# Patient Record
Sex: Male | Born: 1940
Health system: Southern US, Community
[De-identification: ages and names within clinical notes are randomized; demographics above are authoritative.]

## PROBLEM LIST (undated history)

## (undated) DIAGNOSIS — C801 Malignant (primary) neoplasm, unspecified: Secondary | ICD-10-CM

## (undated) DIAGNOSIS — M1990 Unspecified osteoarthritis, unspecified site: Secondary | ICD-10-CM

## (undated) DIAGNOSIS — S0990XA Unspecified injury of head, initial encounter: Secondary | ICD-10-CM

## (undated) DIAGNOSIS — Z9889 Other specified postprocedural states: Secondary | ICD-10-CM

## (undated) DIAGNOSIS — C9110 Chronic lymphocytic leukemia of B-cell type not having achieved remission: Secondary | ICD-10-CM

## (undated) DIAGNOSIS — F419 Anxiety disorder, unspecified: Secondary | ICD-10-CM

## (undated) DIAGNOSIS — C61 Malignant neoplasm of prostate: Secondary | ICD-10-CM

## (undated) DIAGNOSIS — I10 Essential (primary) hypertension: Secondary | ICD-10-CM

## (undated) DIAGNOSIS — F32A Depression, unspecified: Secondary | ICD-10-CM

## (undated) DIAGNOSIS — D72820 Lymphocytosis (symptomatic): Secondary | ICD-10-CM

## (undated) DIAGNOSIS — R112 Nausea with vomiting, unspecified: Secondary | ICD-10-CM

## (undated) HISTORY — DX: Lymphocytosis (symptomatic): D72.820

## (undated) HISTORY — PX: HERNIA REPAIR: SHX51

## (undated) HISTORY — PX: FRACTURE SURGERY: SHX138

## (undated) HISTORY — DX: Chronic lymphocytic leukemia of B-cell type not having achieved remission: C91.10

## (undated) HISTORY — PX: APPENDECTOMY: SHX54

## (undated) HISTORY — PX: CATARACT EXTRACTION, BILATERAL: SHX1313

## (undated) HISTORY — PX: PROSTATE BIOPSY: SHX241

---

## 1998-06-09 HISTORY — PX: CERVICAL DISC SURGERY: SHX588

## 2009-06-09 HISTORY — PX: COLONOSCOPY: SHX174

## 2010-06-09 HISTORY — PX: OTHER SURGICAL HISTORY: SHX169

## 2011-10-22 ENCOUNTER — Ambulatory Visit: Payer: Self-pay | Admitting: Orthopedic Surgery

## 2012-04-21 ENCOUNTER — Encounter (HOSPITAL_COMMUNITY): Payer: Self-pay | Admitting: Pharmacy Technician

## 2012-04-22 ENCOUNTER — Other Ambulatory Visit: Payer: Self-pay | Admitting: Orthopedic Surgery

## 2012-04-27 NOTE — H&P (Signed)
TOTAL HIP ADMISSION H&P  Patient is admitted for right total hip arthroplasty.  Subjective:  Chief Complaint: right hip pain  HPI: Cord Wilczynski, 71 y.o. male, has a history of pain and functional disability in the right hip(s) due to trauma and patient has failed non-surgical conservative treatments for greater than 12 weeks to include NSAID's and/or analgesics, corticosteriod injections and activity modification.  Onset of symptoms was gradual starting 2 years ago with gradually worsening course since that time.The patient noted prior procedures of the hip to include pinning of right hip after a fall from a horse in February 2012. on the right hip(s).  Patient currently rates pain in the right hip at 7 out of 10 with activity. Patient has worsening of pain with activity and weight bearing and pain that interfers with activities of daily living. Patient has evidence of subchondral cysts and joint space narrowing by imaging studies. This condition presents safety issues increasing the risk of falls. This patient has had proximal femur fracture.  There is no current active infection.  There are no active problems to display for this patient.  No past medical history on file.  No past surgical history on file.  No prescriptions prior to admission   No Known Allergies  History  Substance Use Topics  . Smoking status: Not on file  . Smokeless tobacco: Not on file  . Alcohol Use: Not on file    No family history on file.   Review of Systems  Constitutional: Negative.   HENT: Negative.   Eyes: Negative.   Respiratory: Negative.   Cardiovascular: Negative.   Gastrointestinal: Negative.   Genitourinary: Negative.   Musculoskeletal: Positive for joint pain.  Skin: Negative.   Neurological: Negative.   Endo/Heme/Allergies: Negative.   Psychiatric/Behavioral: Negative.     Objective:  Physical Exam  Constitutional: He is oriented to person, place, and time. He appears well-developed  and well-nourished.  HENT:  Head: Normocephalic and atraumatic.  Eyes: Pupils are equal, round, and reactive to light.  Cardiovascular: Normal heart sounds.   Respiratory: Breath sounds normal.  GI: Bowel sounds are normal.  Musculoskeletal:       Right hip: He exhibits decreased range of motion and decreased strength.  Neurological: He is alert and oriented to person, place, and time.  Psychiatric: He has a normal mood and affect.    Vital signs in last 24 hours:    Labs:   There is no height or weight on file to calculate BMI.   Imaging Review Plain radiographs demonstrate severe degenerative joint disease of the right hip(s). The bone quality appears to be adequate for age and reported activity level.  Assessment/Plan:  End stage arthritis, right hip(s)  The patient history, physical examination, clinical judgement of the provider and imaging studies are consistent with end stage degenerative joint disease of the right hip(s) and total hip arthroplasty is deemed medically necessary. The treatment options including medical management, injection therapy, arthroscopy and arthroplasty were discussed at length. The risks and benefits of total hip arthroplasty were presented and reviewed. The risks due to aseptic loosening, infection, stiffness, dislocation/subluxation,  thromboembolic complications and other imponderables were discussed.  The patient acknowledged the explanation, agreed to proceed with the plan and consent was signed. Patient is being admitted for inpatient treatment for surgery, pain control, PT, OT, prophylactic antibiotics, VTE prophylaxis, progressive ambulation and ADL's and discharge planning.The patient is planning to be discharged home with home health services

## 2012-04-28 ENCOUNTER — Encounter (HOSPITAL_COMMUNITY)
Admission: RE | Admit: 2012-04-28 | Discharge: 2012-04-28 | Disposition: A | Discharge status: Home / Self Care | Payer: Medicare Other | Source: Ambulatory Visit | Attending: Orthopedic Surgery | Admitting: Orthopedic Surgery

## 2012-04-28 ENCOUNTER — Encounter (HOSPITAL_COMMUNITY): Payer: Self-pay

## 2012-04-28 HISTORY — DX: Essential (primary) hypertension: I10

## 2012-04-28 HISTORY — DX: Malignant (primary) neoplasm, unspecified: C80.1

## 2012-04-28 HISTORY — DX: Anxiety disorder, unspecified: F41.9

## 2012-04-28 HISTORY — DX: Unspecified osteoarthritis, unspecified site: M19.90

## 2012-04-28 HISTORY — DX: Nausea with vomiting, unspecified: R11.2

## 2012-04-28 HISTORY — DX: Other specified postprocedural states: Z98.890

## 2012-04-28 LAB — URINALYSIS, ROUTINE W REFLEX MICROSCOPIC
Bilirubin Urine: NEGATIVE
Glucose, UA: NEGATIVE mg/dL
Hgb urine dipstick: NEGATIVE
Ketones, ur: NEGATIVE mg/dL
Leukocytes, UA: NEGATIVE
Nitrite: NEGATIVE
Protein, ur: NEGATIVE mg/dL
Specific Gravity, Urine: 1.027 (ref 1.005–1.030)
Urobilinogen, UA: 1 mg/dL (ref 0.0–1.0)
pH: 7 (ref 5.0–8.0)

## 2012-04-28 LAB — CBC WITH DIFFERENTIAL/PLATELET
Basophils Absolute: 0 10*3/uL (ref 0.0–0.1)
Basophils Relative: 0 % (ref 0–1)
Eosinophils Absolute: 0.2 10*3/uL (ref 0.0–0.7)
Eosinophils Relative: 1 % (ref 0–5)
HCT: 43.2 % (ref 39.0–52.0)
Hemoglobin: 14.9 g/dL (ref 13.0–17.0)
Lymphocytes Relative: 66 % — ABNORMAL HIGH (ref 12–46)
Lymphs Abs: 10.2 10*3/uL — ABNORMAL HIGH (ref 0.7–4.0)
MCH: 30.7 pg (ref 26.0–34.0)
MCHC: 34.5 g/dL (ref 30.0–36.0)
MCV: 88.9 fL (ref 78.0–100.0)
Monocytes Absolute: 0.6 10*3/uL (ref 0.1–1.0)
Monocytes Relative: 4 % (ref 3–12)
Neutro Abs: 4.5 10*3/uL (ref 1.7–7.7)
Neutrophils Relative %: 29 % — ABNORMAL LOW (ref 43–77)
Platelets: 180 10*3/uL (ref 150–400)
RBC: 4.86 MIL/uL (ref 4.22–5.81)
RDW: 13.9 % (ref 11.5–15.5)
WBC: 15.5 10*3/uL — ABNORMAL HIGH (ref 4.0–10.5)

## 2012-04-28 LAB — BASIC METABOLIC PANEL
BUN: 18 mg/dL (ref 6–23)
CO2: 27 mEq/L (ref 19–32)
Calcium: 9.2 mg/dL (ref 8.4–10.5)
Chloride: 99 mEq/L (ref 96–112)
Creatinine, Ser: 0.77 mg/dL (ref 0.50–1.35)
GFR calc Af Amer: >90 mL/min (ref >90)
GFR calc non Af Amer: >90 mL/min (ref >90)
Glucose, Bld: 104 mg/dL — ABNORMAL HIGH (ref 70–99)
Potassium: 3.7 mEq/L (ref 3.5–5.1)
Sodium: 135 mEq/L (ref 135–145)

## 2012-04-28 LAB — PATHOLOGIST SMEAR REVIEW

## 2012-04-28 LAB — PROTIME-INR
INR: 0.99 (ref 0.00–1.49)
Prothrombin Time: 13 seconds (ref 11.6–15.2)

## 2012-04-28 LAB — APTT: aPTT: 25 seconds (ref 24–37)

## 2012-04-28 LAB — ABO/RH: ABO/RH(D): AB POS

## 2012-04-28 LAB — TYPE AND SCREEN
ABO/RH(D): AB POS
Antibody Screen: NEGATIVE

## 2012-04-28 LAB — SURGICAL PCR SCREEN
MRSA, PCR: NEGATIVE
Staphylococcus aureus: POSITIVE — AB

## 2012-04-28 MED ORDER — CHLORHEXIDINE GLUCONATE 4 % EX LIQD: 60.0000 mL | Freq: Once | CUTANEOUS | Status: DC

## 2012-04-28 NOTE — Progress Notes (Signed)
req'd notes, ekg from dr Cletis Athens at [place] [facility] [phone]. Susie to fax

## 2012-04-28 NOTE — Pre-Procedure Instructions (Addendum)
20 Brian Bean  04/28/2012   Your procedure is scheduled on:  05/03/12  Report to [facility] at 745 AM.  Call this number if you have problems the morning of surgery: [phone]   Remember:   Do not eat food: or drinkAfter Midnight.  .  Take these medicines the morning of surgery with A SIP OF WATER:prozac/ pain med,coreg. cardizem  STOP naproxen   Do not wear jewelry,   Do not wear lotions, powders, or perfumes  Do not shave 48 hours prior to surgery. Men may shave face and neck.  Do not bring valuables to the hospital.  Contacts, dentures or bridgework may not be worn into surgery.  Leave suitcase in the car. After surgery it may be brought to your room.  For patients admitted to the hospital, checkout time is 11:00 AM the day of discharge.   Patients discharged the day of surgery will not be allowed to drive home.  Name and phone number of your driver Laroy Apple [phone]  Special Instructions: Incentive Spirometry - Practice and bring it with you on the day of surgery. Shower using CHG 2 nights before surgery and the night before surgery.  If you shower the day of surgery use CHG.  Use special wash - you have one bottle of CHG for all showers.  You should use approximately 1/3 of the bottle for each shower.   Please read over the following fact sheets that you were given: Pain Booklet, Coughing and Deep Breathing, Blood Transfusion Information, MRSA Information and Surgical Site Infection Prevention

## 2012-04-29 NOTE — Consult Note (Signed)
Anesthesia chart review: Patient is a 70 year old male scheduled for right hip screw removal, right hip arthroplasty by Dr. Turner Daniels on 05/03/2012. History includes former smoker, hypertension, anxiety, arthritis, postoperative nausea and vomiting, cervical disc surgery, skin cancer. PCP is Dr. Carolin Coy.  EKG on 04/28/12 showed NSR, right BBB.  EKG is stable since 06/13/10.  No CV symptoms were documented at his PAT visit.  Chest x-ray on 04/28/2012 showed no acute abnormality. Old left rib fractures. Post surgical changes in the cervical spine.  Labs showed a WBC of 15.5, atypical lymphocytes.  BMET, UA, H/H, PT/PTT WNL.  I called these results to Agustin Cree.  She reviewed these with Dr. Turner Daniels.  His office is going to forward patient's labs to Dr. Gerri Spore for review (specifcally CBC with differential results).  If Dr. Gerri Spore can not see patient before surgery then it is likely that Dr. Turner Daniels will postpone the procedure.    Would anticipate that patient can proceed from an anesthesia standpoint if asymptomatic from a CV standpoint and no active s/s of infection.  Defer timing of further evaluation of leukocytosis to Dr. Turner Daniels and Dr. Gerri Spore.  Shonna Chock, PA-C  04/29/12 1702

## 2012-04-30 NOTE — Progress Notes (Signed)
Notified patient to arrive at 0700 on DOS due to surgery time change.

## 2012-05-02 MED ORDER — CEFAZOLIN SODIUM-DEXTROSE 2-3 GM-% IV SOLR
2.0000 g | INTRAVENOUS | Status: AC
  Administered 2012-05-03: 2 g via INTRAVENOUS
  Filled 2012-05-02: qty 50

## 2012-05-03 ENCOUNTER — Encounter (HOSPITAL_COMMUNITY)
Admission: RE | Disposition: A | Discharge status: Home Health | Payer: Self-pay | Source: Ambulatory Visit | Attending: Orthopedic Surgery

## 2012-05-03 ENCOUNTER — Inpatient Hospital Stay (HOSPITAL_COMMUNITY): Payer: Medicare Other

## 2012-05-03 ENCOUNTER — Encounter (HOSPITAL_COMMUNITY): Payer: Self-pay | Admitting: Vascular Surgery

## 2012-05-03 ENCOUNTER — Inpatient Hospital Stay (HOSPITAL_COMMUNITY): Payer: Medicare Other | Admitting: Vascular Surgery

## 2012-05-03 ENCOUNTER — Encounter (HOSPITAL_COMMUNITY): Payer: Self-pay | Admitting: Surgery

## 2012-05-03 ENCOUNTER — Inpatient Hospital Stay (HOSPITAL_COMMUNITY)
Admission: RE | Admit: 2012-05-03 | Discharge: 2012-05-05 | DRG: 470 | Disposition: A | Discharge status: Home Health | Payer: Medicare Other | Source: Ambulatory Visit | Attending: Orthopedic Surgery | Admitting: Orthopedic Surgery

## 2012-05-03 DIAGNOSIS — Z7982 Long term (current) use of aspirin: Secondary | ICD-10-CM

## 2012-05-03 DIAGNOSIS — Z85828 Personal history of other malignant neoplasm of skin: Secondary | ICD-10-CM

## 2012-05-03 DIAGNOSIS — Z472 Encounter for removal of internal fixation device: Secondary | ICD-10-CM

## 2012-05-03 DIAGNOSIS — I10 Essential (primary) hypertension: Secondary | ICD-10-CM | POA: Diagnosis present

## 2012-05-03 DIAGNOSIS — Z01812 Encounter for preprocedural laboratory examination: Secondary | ICD-10-CM

## 2012-05-03 DIAGNOSIS — F411 Generalized anxiety disorder: Secondary | ICD-10-CM | POA: Diagnosis present

## 2012-05-03 DIAGNOSIS — Z79899 Other long term (current) drug therapy: Secondary | ICD-10-CM

## 2012-05-03 DIAGNOSIS — M87059 Idiopathic aseptic necrosis of unspecified femur: Principal | ICD-10-CM | POA: Diagnosis present

## 2012-05-03 DIAGNOSIS — M167 Other unilateral secondary osteoarthritis of hip: Secondary | ICD-10-CM | POA: Diagnosis present

## 2012-05-03 HISTORY — PX: TOTAL HIP ARTHROPLASTY: SHX124

## 2012-05-03 SURGERY — ARTHROPLASTY, HIP, TOTAL,POSTERIOR APPROACH
Anesthesia: Spinal | Site: Hip | Laterality: Right | Wound class: Clean

## 2012-05-03 MED ORDER — FLEET ENEMA 7-19 GM/118ML RE ENEM: 1.0000 | ENEMA | Freq: Once | RECTAL | Status: AC | PRN

## 2012-05-03 MED ORDER — BUPIVACAINE-EPINEPHRINE PF 0.25-1:200000 % IJ SOLN
INTRAMUSCULAR | Status: AC
  Filled 2012-05-03: qty 30

## 2012-05-03 MED ORDER — BISACODYL 5 MG PO TBEC: 5.0000 mg | DELAYED_RELEASE_TABLET | Freq: Every day | ORAL | Status: DC | PRN

## 2012-05-03 MED ORDER — ONDANSETRON HCL 4 MG/2ML IJ SOLN
INTRAMUSCULAR | Status: AC
  Filled 2012-05-03: qty 2

## 2012-05-03 MED ORDER — PROPOFOL INFUSION 10 MG/ML OPTIME
INTRAVENOUS | Status: DC | PRN
  Administered 2012-05-03: 100 ug/kg/min via INTRAVENOUS
  Administered 2012-05-03: 11:00:00 via INTRAVENOUS

## 2012-05-03 MED ORDER — ZOLPIDEM TARTRATE 5 MG PO TABS: 5.0000 mg | ORAL_TABLET | Freq: Every evening | ORAL | Status: DC | PRN

## 2012-05-03 MED ORDER — OXYCODONE HCL 5 MG PO TABS
ORAL_TABLET | ORAL | Status: AC
  Filled 2012-05-03: qty 2

## 2012-05-03 MED ORDER — ONDANSETRON HCL 4 MG PO TABS: 4.0000 mg | ORAL_TABLET | Freq: Four times a day (QID) | ORAL | Status: DC | PRN

## 2012-05-03 MED ORDER — EPHEDRINE SULFATE 50 MG/ML IJ SOLN
INTRAMUSCULAR | Status: DC | PRN
  Administered 2012-05-03 (×2): 5 mg via INTRAVENOUS
  Administered 2012-05-03: 10 mg via INTRAVENOUS
  Administered 2012-05-03 (×3): 5 mg via INTRAVENOUS

## 2012-05-03 MED ORDER — METOCLOPRAMIDE HCL 10 MG PO TABS: 5.0000 mg | ORAL_TABLET | Freq: Three times a day (TID) | ORAL | Status: DC | PRN

## 2012-05-03 MED ORDER — PHENYLEPHRINE HCL 10 MG/ML IJ SOLN
INTRAMUSCULAR | Status: DC | PRN
  Administered 2012-05-03 (×2): 40 ug via INTRAVENOUS

## 2012-05-03 MED ORDER — LACTATED RINGERS IV SOLN
INTRAVENOUS | Status: DC
  Administered 2012-05-03: 09:00:00 via INTRAVENOUS

## 2012-05-03 MED ORDER — HYDROMORPHONE HCL PF 1 MG/ML IJ SOLN
0.2500 mg | INTRAMUSCULAR | Status: DC | PRN
  Administered 2012-05-03 (×2): 0.5 mg via INTRAVENOUS

## 2012-05-03 MED ORDER — PHENOL 1.4 % MT LIQD: 1.0000 | OROMUCOSAL | Status: DC | PRN

## 2012-05-03 MED ORDER — HYDROMORPHONE HCL PF 1 MG/ML IJ SOLN: 1.0000 mg | INTRAMUSCULAR | Status: DC | PRN

## 2012-05-03 MED ORDER — OXYCODONE HCL 5 MG PO TABS
5.0000 mg | ORAL_TABLET | ORAL | Status: DC | PRN
  Administered 2012-05-03 – 2012-05-05 (×4): 10 mg via ORAL
  Filled 2012-05-03 (×3): qty 2

## 2012-05-03 MED ORDER — ONDANSETRON HCL 4 MG/2ML IJ SOLN
INTRAMUSCULAR | Status: DC | PRN
  Administered 2012-05-03: 4 mg via INTRAVENOUS

## 2012-05-03 MED ORDER — GLYCOPYRROLATE 0.2 MG/ML IJ SOLN
INTRAMUSCULAR | Status: DC | PRN
  Administered 2012-05-03: 0.2 mg via INTRAVENOUS

## 2012-05-03 MED ORDER — SODIUM CHLORIDE 0.9 % IR SOLN
Status: DC | PRN
  Administered 2012-05-03: 1000 mL

## 2012-05-03 MED ORDER — HYDROMORPHONE HCL PF 1 MG/ML IJ SOLN
INTRAMUSCULAR | Status: AC
  Filled 2012-05-03: qty 1

## 2012-05-03 MED ORDER — LISINOPRIL 20 MG PO TABS
20.0000 mg | ORAL_TABLET | Freq: Two times a day (BID) | ORAL | Status: DC
  Administered 2012-05-03 – 2012-05-05 (×4): 20 mg via ORAL
  Filled 2012-05-03 (×5): qty 1

## 2012-05-03 MED ORDER — ALUM & MAG HYDROXIDE-SIMETH 200-200-20 MG/5ML PO SUSP: 30.0000 mL | ORAL | Status: DC | PRN

## 2012-05-03 MED ORDER — BUPIVACAINE-EPINEPHRINE PF 0.25-1:200000 % IJ SOLN
INTRAMUSCULAR | Status: DC | PRN
  Administered 2012-05-03: 20 mL

## 2012-05-03 MED ORDER — ACETAMINOPHEN 10 MG/ML IV SOLN: 1000.0000 mg | Freq: Once | INTRAVENOUS | Status: DC | PRN

## 2012-05-03 MED ORDER — METHOCARBAMOL 500 MG PO TABS
500.0000 mg | ORAL_TABLET | Freq: Four times a day (QID) | ORAL | Status: DC | PRN
  Administered 2012-05-03 – 2012-05-04 (×2): 500 mg via ORAL
  Filled 2012-05-03: qty 1

## 2012-05-03 MED ORDER — CARVEDILOL 25 MG PO TABS
25.0000 mg | ORAL_TABLET | Freq: Two times a day (BID) | ORAL | Status: DC
  Administered 2012-05-03 – 2012-05-05 (×4): 25 mg via ORAL
  Filled 2012-05-03 (×6): qty 1

## 2012-05-03 MED ORDER — MENTHOL 3 MG MT LOZG: 1.0000 | LOZENGE | OROMUCOSAL | Status: DC | PRN

## 2012-05-03 MED ORDER — ACETAMINOPHEN 325 MG PO TABS: 650.0000 mg | ORAL_TABLET | Freq: Four times a day (QID) | ORAL | Status: DC | PRN

## 2012-05-03 MED ORDER — LACTATED RINGERS IV SOLN
INTRAVENOUS | Status: DC | PRN
  Administered 2012-05-03 (×2): via INTRAVENOUS

## 2012-05-03 MED ORDER — CELECOXIB 200 MG PO CAPS
200.0000 mg | ORAL_CAPSULE | Freq: Two times a day (BID) | ORAL | Status: DC
  Administered 2012-05-03 – 2012-05-05 (×5): 200 mg via ORAL
  Filled 2012-05-03 (×6): qty 1

## 2012-05-03 MED ORDER — MIDAZOLAM HCL 5 MG/5ML IJ SOLN
INTRAMUSCULAR | Status: DC | PRN
  Administered 2012-05-03: 2 mg via INTRAVENOUS

## 2012-05-03 MED ORDER — DEXAMETHASONE SODIUM PHOSPHATE 10 MG/ML IJ SOLN
INTRAMUSCULAR | Status: DC | PRN
  Administered 2012-05-03: 8 mg via INTRAVENOUS

## 2012-05-03 MED ORDER — METHOCARBAMOL 500 MG PO TABS
ORAL_TABLET | ORAL | Status: AC
  Filled 2012-05-03: qty 1

## 2012-05-03 MED ORDER — FLUOXETINE HCL 20 MG PO CAPS
40.0000 mg | ORAL_CAPSULE | Freq: Every day | ORAL | Status: DC
  Administered 2012-05-04 – 2012-05-05 (×2): 40 mg via ORAL
  Filled 2012-05-03 (×2): qty 2

## 2012-05-03 MED ORDER — ONDANSETRON HCL 4 MG/2ML IJ SOLN
4.0000 mg | Freq: Once | INTRAMUSCULAR | Status: AC | PRN
  Administered 2012-05-03: 4 mg via INTRAVENOUS

## 2012-05-03 MED ORDER — FENTANYL CITRATE 0.05 MG/ML IJ SOLN
INTRAMUSCULAR | Status: DC | PRN
  Administered 2012-05-03 (×3): 50 ug via INTRAVENOUS

## 2012-05-03 MED ORDER — DEXTROSE-NACL 5-0.45 % IV SOLN: INTRAVENOUS | Status: DC

## 2012-05-03 MED ORDER — ACETAMINOPHEN 10 MG/ML IV SOLN
1000.0000 mg | Freq: Four times a day (QID) | INTRAVENOUS | Status: AC
  Administered 2012-05-03 – 2012-05-04 (×4): 1000 mg via INTRAVENOUS
  Filled 2012-05-03 (×4): qty 100

## 2012-05-03 MED ORDER — METHOCARBAMOL 100 MG/ML IJ SOLN
500.0000 mg | Freq: Four times a day (QID) | INTRAVENOUS | Status: DC | PRN
  Filled 2012-05-03: qty 5

## 2012-05-03 MED ORDER — MAGNESIUM HYDROXIDE 400 MG/5ML PO SUSP: 30.0000 mL | Freq: Every day | ORAL | Status: DC | PRN

## 2012-05-03 MED ORDER — HYDROCHLOROTHIAZIDE 25 MG PO TABS
25.0000 mg | ORAL_TABLET | Freq: Every day | ORAL | Status: DC
  Administered 2012-05-04: 25 mg via ORAL
  Filled 2012-05-03 (×2): qty 1

## 2012-05-03 MED ORDER — DIPHENHYDRAMINE HCL 12.5 MG/5ML PO ELIX: 12.5000 mg | ORAL_SOLUTION | ORAL | Status: DC | PRN

## 2012-05-03 MED ORDER — KCL IN DEXTROSE-NACL 20-5-0.45 MEQ/L-%-% IV SOLN
INTRAVENOUS | Status: DC
  Administered 2012-05-04: 125 mL/h via INTRAVENOUS
  Administered 2012-05-04: 13:00:00 via INTRAVENOUS
  Filled 2012-05-03 (×8): qty 1000

## 2012-05-03 MED ORDER — FLUOXETINE HCL 40 MG PO CAPS
40.0000 mg | ORAL_CAPSULE | Freq: Every day | ORAL | Status: DC
Start: 2012-05-03 — End: 2012-05-03

## 2012-05-03 MED ORDER — ACETAMINOPHEN 650 MG RE SUPP: 650.0000 mg | Freq: Four times a day (QID) | RECTAL | Status: DC | PRN

## 2012-05-03 MED ORDER — ASPIRIN EC 325 MG PO TBEC
325.0000 mg | DELAYED_RELEASE_TABLET | Freq: Two times a day (BID) | ORAL | Status: DC
  Administered 2012-05-03 – 2012-05-05 (×5): 325 mg via ORAL
  Filled 2012-05-03 (×6): qty 1

## 2012-05-03 MED ORDER — ONDANSETRON HCL 4 MG/2ML IJ SOLN: 4.0000 mg | Freq: Four times a day (QID) | INTRAMUSCULAR | Status: DC | PRN

## 2012-05-03 MED ORDER — METOCLOPRAMIDE HCL 5 MG/ML IJ SOLN: 5.0000 mg | Freq: Three times a day (TID) | INTRAMUSCULAR | Status: DC | PRN

## 2012-05-03 MED ORDER — DILTIAZEM HCL ER 60 MG PO CP12
120.0000 mg | ORAL_CAPSULE | Freq: Two times a day (BID) | ORAL | Status: DC
  Administered 2012-05-03 – 2012-05-05 (×4): 120 mg via ORAL
  Filled 2012-05-03 (×5): qty 2

## 2012-05-03 SURGICAL SUPPLY — 53 items
BLADE SAW SAG 73X25 THK (BLADE) ×1
BLADE SAW SGTL 18X1.27X75 (BLADE) IMPLANT
BLADE SAW SGTL 73X25 THK (BLADE) ×1 IMPLANT
BLADE SAW SGTL MED 73X18.5 STR (BLADE) IMPLANT
BRUSH FEMORAL CANAL (MISCELLANEOUS) IMPLANT
CLOTH BEACON ORANGE TIMEOUT ST (SAFETY) ×2 IMPLANT
COVER BACK TABLE 24X17X13 BIG (DRAPES) IMPLANT
COVER SURGICAL LIGHT HANDLE (MISCELLANEOUS) ×4 IMPLANT
DRAPE ORTHO SPLIT 77X108 STRL (DRAPES) ×1
DRAPE PROXIMA HALF (DRAPES) ×2 IMPLANT
DRAPE SURG ORHT 6 SPLT 77X108 (DRAPES) ×1 IMPLANT
DRAPE U-SHAPE 47X51 STRL (DRAPES) ×2 IMPLANT
DRILL BIT 7/64X5 (BIT) ×2 IMPLANT
DRSG MEPILEX BORDER 4X12 (GAUZE/BANDAGES/DRESSINGS) ×2 IMPLANT
DRSG MEPILEX BORDER 4X8 (GAUZE/BANDAGES/DRESSINGS) ×2 IMPLANT
DURAPREP 26ML APPLICATOR (WOUND CARE) ×2 IMPLANT
ELECT BLADE 4.0 EZ CLEAN MEGAD (MISCELLANEOUS)
ELECT REM PT RETURN 9FT ADLT (ELECTROSURGICAL) ×2
ELECTRODE BLDE 4.0 EZ CLN MEGD (MISCELLANEOUS) IMPLANT
ELECTRODE REM PT RTRN 9FT ADLT (ELECTROSURGICAL) ×1 IMPLANT
GAUZE XEROFORM 1X8 LF (GAUZE/BANDAGES/DRESSINGS) ×2 IMPLANT
GLOVE BIO SURGEON STRL SZ7 (GLOVE) ×2 IMPLANT
GLOVE BIO SURGEON STRL SZ7.5 (GLOVE) ×2 IMPLANT
GLOVE BIOGEL PI IND STRL 7.0 (GLOVE) ×1 IMPLANT
GLOVE BIOGEL PI IND STRL 8 (GLOVE) ×1 IMPLANT
GLOVE BIOGEL PI INDICATOR 7.0 (GLOVE) ×1
GLOVE BIOGEL PI INDICATOR 8 (GLOVE) ×1
GOWN PREVENTION PLUS XLARGE (GOWN DISPOSABLE) ×2 IMPLANT
GOWN STRL NON-REIN LRG LVL3 (GOWN DISPOSABLE) ×4 IMPLANT
HANDPIECE INTERPULSE COAX TIP (DISPOSABLE)
HOOD PEEL AWAY FACE SHEILD DIS (HOOD) ×4 IMPLANT
KIT BASIN OR (CUSTOM PROCEDURE TRAY) ×2 IMPLANT
KIT ROOM TURNOVER OR (KITS) ×2 IMPLANT
MANIFOLD NEPTUNE II (INSTRUMENTS) ×2 IMPLANT
NEEDLE 22X1 1/2 (OR ONLY) (NEEDLE) ×2 IMPLANT
NS IRRIG 1000ML POUR BTL (IV SOLUTION) ×2 IMPLANT
PACK TOTAL JOINT (CUSTOM PROCEDURE TRAY) ×2 IMPLANT
PAD ARMBOARD 7.5X6 YLW CONV (MISCELLANEOUS) ×4 IMPLANT
PASSER SUT SWANSON 36MM LOOP (INSTRUMENTS) ×2 IMPLANT
PRESSURIZER FEMORAL UNIV (MISCELLANEOUS) IMPLANT
SET HNDPC FAN SPRY TIP SCT (DISPOSABLE) IMPLANT
SUT ETHIBOND 2 V 37 (SUTURE) ×2 IMPLANT
SUT ETHILON 3 0 FSL (SUTURE) ×2 IMPLANT
SUT VIC AB 0 CTB1 27 (SUTURE) ×2 IMPLANT
SUT VIC AB 1 CTX 36 (SUTURE) ×1
SUT VIC AB 1 CTX36XBRD ANBCTR (SUTURE) ×1 IMPLANT
SUT VIC AB 2-0 CTB1 (SUTURE) ×2 IMPLANT
SYR CONTROL 10ML LL (SYRINGE) ×2 IMPLANT
TOWEL OR 17X24 6PK STRL BLUE (TOWEL DISPOSABLE) ×2 IMPLANT
TOWEL OR 17X26 10 PK STRL BLUE (TOWEL DISPOSABLE) ×2 IMPLANT
TOWER CARTRIDGE SMART MIX (DISPOSABLE) IMPLANT
TRAY FOLEY CATH 14FR (SET/KITS/TRAYS/PACK) IMPLANT
WATER STERILE IRR 1000ML POUR (IV SOLUTION) ×8 IMPLANT

## 2012-05-03 NOTE — Anesthesia Postprocedure Evaluation (Signed)
  Anesthesia Post-op Note  Patient: Brian Bean  Procedure(s) Performed: Procedure(s) (LRB) with comments: TOTAL HIP ARTHROPLASTY (Right)  Patient Location: PACU  Anesthesia Type:Spinal  Level of Consciousness: awake, alert  and oriented  Airway and Oxygen Therapy: Patient Spontanous Breathing and Patient connected to nasal cannula oxygen  Post-op Pain: none  Post-op Assessment: Post-op Vital signs reviewed and Patient's Cardiovascular Status Stable  Post-op Vital Signs: stable  Complications: No apparent anesthesia complications

## 2012-05-03 NOTE — Evaluation (Signed)
Physical Therapy Evaluation Patient Details Name: Brian Bean MRN: [id number] DOB: 03/09/1941 Today's Date: 05/03/2012 Time: 6213-0865 PT Time Calculation (min): 45 min  PT Assessment / Plan / Recommendation Clinical Impression  Patient is a 71 yo male s/p Rt. THA.  Did well with mobility today - anticipate he will progress well.  Patient will benefit from acute PT to maximize independence prior to return home.    PT Assessment  Patient needs continued PT services    Follow Up Recommendations  Home health PT;Supervision/Assistance - 24 hour    Does the patient have the potential to tolerate intense rehabilitation      Barriers to Discharge        Equipment Recommendations  3 in 1 bedside comode    Recommendations for Other Services     Frequency 7X/week    Precautions / Restrictions Precautions Precautions: Posterior Hip Precaution Booklet Issued: Yes (comment) Precaution Comments: Reviewed posterior hip precautions with patient and provided handout. Restrictions Weight Bearing Restrictions: Yes RLE Weight Bearing: Weight bearing as tolerated   Pertinent Vitals/Pain Pain limiting mobility today.      Mobility  Bed Mobility Bed Mobility: Supine to Sit;Sitting - Scoot to [place] of Bed;Sit to Supine Supine to Sit: 4: Min assist;With rails;HOB elevated Sitting - Scoot to Edge of Bed: 4: Min guard Sit to Supine: 4: Min assist;HOB elevated Details for Bed Mobility Assistance: Verbal cues for technique to maintain hip precautions.  Assist provided to move RLE off of and onto bed. Transfers Transfers: Sit to Stand;Stand to Sit Sit to Stand: 4: Min assist;From elevated surface;With upper extremity assist;From bed Stand to Sit: 4: Min assist;With upper extremity assist;To bed Details for Transfer Assistance: Verbal cues for hand placement and placement of RLE during transfers.  Cues to scoot to EOB before attempting to stand. Ambulation/Gait Ambulation/Gait Assistance:  Not tested (comment)       Exercises Total Joint Exercises Ankle Circles/Pumps: AROM;Both;10 reps;Supine   PT Diagnosis: Difficulty walking;Abnormality of gait;Acute pain  PT Problem List: Decreased strength;Decreased range of motion;Decreased activity tolerance;Decreased mobility;Decreased knowledge of use of DME;Decreased knowledge of precautions;Pain PT Treatment Interventions: DME instruction;Gait training;Stair training;Functional mobility training;Patient/family education;Therapeutic exercise   PT Goals Acute Rehab PT Goals PT Goal Formulation: With patient Time For Goal Achievement: 05/10/12 Potential to Achieve Goals: Good Pt will go Supine/Side to Sit: Independently;with HOB 0 degrees PT Goal: Supine/Side to Sit - Progress: Goal set today Pt will go Sit to Supine/Side: Independently;with HOB 0 degrees PT Goal: Sit to Supine/Side - Progress: Goal set today Pt will go Sit to Stand: with supervision;with upper extremity assist PT Goal: Sit to Stand - Progress: Goal set today Pt will Ambulate: >150 feet;with supervision;with rolling walker PT Goal: Ambulate - Progress: Goal set today Pt will Go Up / Down Stairs: 3-5 stairs;with supervision;with rail(s);with least restrictive assistive device PT Goal: Up/Down Stairs - Progress: Goal set today Pt will Perform Home Exercise Program: with supervision, verbal cues required/provided PT Goal: Perform Home Exercise Program - Progress: Goal set today  Visit Information  Last PT Received On: 05/03/12 Assistance Needed: +1    Subjective Data  Subjective: Patient joking throughout session. Patient Stated Goal: To decrease pain.  To go home soon.   Prior Functioning  Home Living Lives With: Significant other Available Help at Discharge: Family;Available 24 hours/day Type of Home: House Home Access: Stairs to enter [employer] of Steps: 4 Entrance Stairs-Rails: Right;Left Home Layout: One level Bathroom Shower/Tub:  Walk-in shower (built-in seat) Bathroom  Toilet: Handicapped height Bathroom Accessibility: Yes How Accessible: Accessible via walker Home Adaptive Equipment: Walker - rolling;Straight cane;Crutches Prior Function Level of Independence: Independent Able to Take Stairs?: Yes Driving: Yes Vocation: Retired Comments: Very active pta. Communication Communication: No difficulties    Cognition  Overall Cognitive Status: Appears within functional limits for tasks assessed/performed Arousal/Alertness: Awake/alert Orientation Level: Appears intact for tasks assessed Behavior During Session: [facility] for tasks performed    Extremity/Trunk Assessment Right Upper Extremity Assessment RUE ROM/Strength/Tone: WFL for tasks assessed RUE Sensation: WFL - Light Touch Left Upper Extremity Assessment LUE ROM/Strength/Tone: WFL for tasks assessed LUE Sensation: WFL - Light Touch Right Lower Extremity Assessment RLE ROM/Strength/Tone: Deficits;Unable to fully assess;Due to pain RLE ROM/Strength/Tone Deficits: Able to assist moving RLE off of bed. Left Lower Extremity Assessment LLE ROM/Strength/Tone: WFL for tasks assessed LLE Sensation: WFL - Light Touch Trunk Assessment Trunk Assessment: Normal   Balance Balance Balance Assessed: Yes Static Sitting Balance Static Sitting - Balance Support: No upper extremity supported;Feet supported Static Sitting - Level of Assistance: 5: Stand by assistance Static Sitting - Comment/# of Minutes: Patient able to maintain upright posture and balance in unsupported sitting x 6 minutes. Static Standing Balance Static Standing - Balance Support: Bilateral upper extremity supported Static Standing - Level of Assistance: 5: Stand by assistance Static Standing - Comment/# of Minutes: Patient required bil. UE support on RW to maintain upright position and balance in standing.  Minimal weightbearing on RLE (decreased control due to block and pain).  Patient stood x 3  minutes, and returned to sitting due to pain and fatigue.  End of Session PT - End of Session Equipment Utilized During Treatment: Gait belt Activity Tolerance: Patient tolerated treatment well Patient left: in bed;with call bell/phone within reach;with family/visitor present Nurse Communication: Mobility status  GP     Vena Austria 05/03/2012, 8:00 PM Durenda Hurt. Renaldo Fiddler, [facility] Acute Rehab Services Pager [phone]

## 2012-05-03 NOTE — Interval H&P Note (Signed)
History and Physical Interval Note:  05/03/2012 9:46 AM  Brian Bean  has presented today for surgery, with the diagnosis of RIGHT HIP OSTEOPOROSIS/POSSIBLE TRAUMA PINS  The various methods of treatment have been discussed with the patient and family. After consideration of risks, benefits and other options for treatment, the patient has consented to  Procedure(s) (LRB) with comments: TOTAL HIP ARTHROPLASTY (Right) as a surgical intervention .  The patient's history has been reviewed, patient examined, no change in status, stable for surgery.  I have reviewed the patient's chart and labs.  Questions were answered to the patient's satisfaction.     [name]

## 2012-05-03 NOTE — Progress Notes (Signed)
UR COMPLETED  

## 2012-05-03 NOTE — Op Note (Signed)
OPERATIVE REPORT    DATE OF PROCEDURE:  05/03/2012       PREOPERATIVE DIAGNOSIS:  RIGHT HIP POSTTRAUMATIC OSTEOARTHRITIS/Retained TRAUMA PINS                                                          POSTOPERATIVE DIAGNOSIS:  RIGHT HIP POSTTRAUMATIC OSTEOARTHRITIS/ retained cannulated screws                                                         PROCEDURE: Removal of cannulated screws x3, R total hip arthroplasty using a 58 mm DePuy Pinnacle  Cup, [employer], 10-degree polyethylene liner index superior  and posterior, a +0 36 mm ceramic head, a [phone] SROM stem, 20FL Cone   SURGEON: Corbyn Wildey J    ASSISTANT:   Mauricia Area, PA-C  (present throughout entire procedure and necessary for timely completion of the procedure)   ANESTHESIA: Spinal BLOOD LOSS: 500 FLUID REPLACEMENT: 1500 crystalloid DRAINS: None  COMPLICATIONS: none    INDICATIONS FOR PROCEDURE: A 71 y.o. year-old With  RIGHT HIP POSTTRAUMATIC OSTEOARTHRITIS WITH RETAINED CANNULATED SCREWS X3   for 1 years, x-rays show bone-on-bone arthritic changes. Despite conservative measures with observation, anti-inflammatory medicine, narcotics, use of a cane, and intra-articular injection of anesthetic and cortisone and provided temporarily if patient still has severe unremitting pain and can ambulate only a few blocks before resting.  Patient desires elective removal of the cannulated screws and right total hip arthroplasty to decrease pain and increase function. The risks, benefits, and alternatives were discussed at length including but not limited to the risks of infection, bleeding, nerve injury, stiffness, blood clots, the need for revision surgery, cardiopulmonary complications, among others, and they were willing to proceed.y have been discussed. Questions answered.     PROCEDURE IN DETAIL: The patient was identified by armband,  received preoperative IV antibiotics in the holding area at [facility],  taken to the operating room , appropriate anesthetic monitors  were attached and spinal anesthesia induced.  Pt was rolled into the L lateral decubitus position and fixed there with a Stulberg Mark II pelvic clamp and the R lower extremity was then prepped and draped  in the usual sterile fashion from the ankle to the hemipelvis. A time-out  procedure was performed. The skin along the lateral hip and thigh  infiltrated with 10 mL of 0.5% Marcaine and epinephrine solution. We  then made a posterolateral approach to the hip. With a #10 blade, 20 cm  incision through skin and subcutaneous tissue down to the level of the  IT band, and long enough to engage the cannulated screws distally. Small bleeders were identified and cauterized. IT was band cut in  line with skin incision exposing the greater trochanter and the proximal vastus lateralis. He could easily palpate the screw heads through the fascia of the vastus lateralis. A small longitudinal incision was made over each screw head and each cannulated screw was removed without difficulty. A Cobra retractor was placed between the gluteus minimus and the superior hip joint capsule, and a spiked Cobra between the quadratus femoris and the inferior  hip joint capsule. This isolated the short  external rotators and piriformis tendons. These were tagged with a #2 Ethibond  suture and cut off their insertion on the intertrochanteric crest. The posterior  capsule was then developed into an acetabular-based flap from Posterior Superior off of the acetabulum out over the femoral neck and back posterior inferior to the acetabular rim. This flap was tagged with two #2 Ethibond sutures and retracted protecting the sciatic nerve. This exposed the arthritic femoral head and osteophytes. The hip was then flexed and internally rotated, dislocating the femoral head and a standard neck cut performed 1 fingerbreadth above the lesser trochanter.  A spiked Cobra was placed in the  cotyloid notch and a Hohmann retractor was then used to lever the femur anteriorly off of the anterior pelvic column. A posterior-inferior wing retractor was placed at the junction of the acetabulum and the ischium completing the acetabular exposure.We then removed the peripheral osteophytes and labrum from the acetabulum. We then reamed the acetabulum up to 57 mm with basket reamers obtaining good coverage in all quadrants, irrigated out with normal  saline solution and hammered into place a 58 mm pinnacle cup in 45  degrees of abduction and about 20 degrees of anteversion. More  peripheral osteophytes removed and a trial 10-degree liner placed with the  index superior-posterior. The hip was then flexed and internally rotated exposing the  proximal femur, which was entered with the initiating reamer followed by  the axial reamers up to a 15.5 mm full depth and 16mm partial depth. We then conically reamed to 56F to the correct depth for a 42 base neck. The calcar was milled to 56FL. A trial cone and stem was inserted in the 25 degrees anteversion, with a +0 36mm trial head. Trial reduction was then performed and excellent stability was noted with at 90 of flexion with 70 of internal rotation and then full extension with maximal external rotation. The hip could not be dislocated in full extension. The knee could easily flex  to about 130 degrees. We also stretched the abductors at this point,  because of the preexisting adductor contractures. All trial components  were then removed. The acetabulum was irrigated out with normal saline  solution. A titanium [facility] was then screwed into place  followed by a 10-degree polyethylene liner index superior-posterior. On  the femoral side a 56FL ZTT1 cone was hammered into place, followed by a [phone] SROM stem in 25 degrees of anteversion. At this point, a +0 36 mm ceramic head was  hammered on the stem. The hip was reduced. We checked our  stability  one more time and found to be excellent. The wound was once again  thoroughly irrigated out with normal saline solution pulse lavage. The  capsular flap and short external rotators were repaired back to the  intertrochanteric crest through drill holes with a #2 Ethibond suture.  The IT band was closed with running 1 Vicryl suture. The subcutaneous  tissue with 0 and 2-0 undyed Vicryl suture and the skin with running  interlocking 3-0 nylon suture. Dressing of Xeroform and Mepilex was  then applied. The patient was then unclamped, rolled supine, awaken extubated and taken to recovery room without difficulty in stable condition.   Khadija Thier J 05/03/2012, 11:24 AM

## 2012-05-03 NOTE — Progress Notes (Signed)
Orthopedic Tech Progress Note Patient Details:  Brian Bean [birth year] [id number] Applied overhead frame and trapeze bar to bed. Patient ID: Brian Bean, male   DOB: 10-23-1940, 71 y.o.   MRN: [id number]   Brian Bean 05/03/2012, 7:01 PM

## 2012-05-03 NOTE — Transfer of Care (Signed)
Immediate Anesthesia Transfer of Care Note  Patient: Brian Bean  Procedure(s) Performed: Procedure(s) (LRB) with comments: TOTAL HIP ARTHROPLASTY (Right)  Patient Location: PACU  Anesthesia Type:MAC  Level of Consciousness: awake, alert , oriented and patient cooperative  Airway & Oxygen Therapy: Patient Spontanous Breathing and Patient connected to face mask oxygen  Post-op Assessment: Report given to PACU RN, Post -op Vital signs reviewed and stable and Patient moving all extremities X 4  Post vital signs: Reviewed and stable  Complications: No apparent anesthesia complications

## 2012-05-03 NOTE — Anesthesia Procedure Notes (Addendum)
Anesthesia Regional Block:   Narrative:    Spinal  Patient location during procedure: OR Start time: 05/03/2012 10:10 AM End time: 05/03/2012 10:15 AM Staffing Anesthesiologist: Noreene Larsson, DAVID Performed by: anesthesiologist  Preanesthetic Checklist Completed: patient identified, site marked, surgical consent, pre-op evaluation, timeout performed, IV checked, risks and benefits discussed and monitors and equipment checked Spinal Block Patient position: right lateral decubitus Prep: Betadine Patient monitoring: heart rate, cardiac monitor, continuous pulse ox and blood pressure Approach: midline Location: L3-4 Injection technique: single-shot Needle Needle type: Tuohy  Needle gauge: 22 G Needle length: 9 cm Catheter at skin depth: 8 cm Assessment Sensory level: T10 Additional Notes 7.5 Mg 0.75% marcaine injected easily, clear CSF  Kipp Brood, MD  Procedure Name: MAC Date/Time: 05/03/2012 10:21 AM Performed by: Angelica Pou Pre-anesthesia Checklist: Patient identified, Timeout performed, Emergency Drugs available, Suction available and Patient being monitored Patient Re-evaluated:Patient Re-evaluated prior to inductionOxygen Delivery Method: Simple face mask

## 2012-05-03 NOTE — Anesthesia Preprocedure Evaluation (Signed)
Anesthesia Evaluation  Patient identified by MRN, date of birth, ID band Patient awake    Reviewed: Allergy & Precautions, H&P , NPO status , Patient's Chart, lab work & pertinent test results, reviewed documented beta blocker date and time   Airway Mallampati: II TM Distance: >3 FB Neck ROM: Full    Dental  (+) Teeth Intact and Dental Advisory Given   Pulmonary  breath sounds clear to auscultation        Cardiovascular Rhythm:Regular     Neuro/Psych    GI/Hepatic   Endo/Other    Renal/GU      Musculoskeletal   Abdominal   Peds  Hematology   Anesthesia Other Findings   Reproductive/Obstetrics                           Anesthesia Physical Anesthesia Plan  ASA: III  Anesthesia Plan: Spinal   Post-op Pain Management:    Induction: Intravenous  Airway Management Planned:   Additional Equipment:   Intra-op Plan:   Post-operative Plan:   Informed Consent: I have reviewed the patients History and Physical, chart, labs and discussed the procedure including the risks, benefits and alternatives for the proposed anesthesia with the patient or authorized representative who has indicated his/her understanding and acceptance.   Dental advisory given  Plan Discussed with: CRNA and Surgeon  Anesthesia Plan Comments: (Htn H/O Severe N/V Post-op Elevated WBC no obvious source of infection   Plan SAB  Kipp Brood, MD  )        Anesthesia Quick Evaluation

## 2012-05-03 NOTE — Preoperative (Signed)
Beta Blockers   Reason not to administer Beta Blockers:Carvedilol taken 05/03/12

## 2012-05-04 ENCOUNTER — Encounter (HOSPITAL_COMMUNITY): Payer: Self-pay | Admitting: Orthopedic Surgery

## 2012-05-04 LAB — CBC
HCT: 31.6 % — ABNORMAL LOW (ref 39.0–52.0)
Hemoglobin: 10.9 g/dL — ABNORMAL LOW (ref 13.0–17.0)
MCH: 31.1 pg (ref 26.0–34.0)
MCHC: 34.5 g/dL (ref 30.0–36.0)
MCV: 90.3 fL (ref 78.0–100.0)
Platelets: 174 10*3/uL (ref 150–400)
RBC: 3.5 MIL/uL — ABNORMAL LOW (ref 4.22–5.81)
RDW: 14 % (ref 11.5–15.5)
WBC: 16.3 10*3/uL — ABNORMAL HIGH (ref 4.0–10.5)

## 2012-05-04 LAB — BASIC METABOLIC PANEL
BUN: 22 mg/dL (ref 6–23)
CO2: 26 mEq/L (ref 19–32)
Calcium: 8.3 mg/dL — ABNORMAL LOW (ref 8.4–10.5)
Chloride: 100 mEq/L (ref 96–112)
Creatinine, Ser: 0.83 mg/dL (ref 0.50–1.35)
GFR calc Af Amer: >90 mL/min (ref >90)
GFR calc non Af Amer: 88 mL/min — ABNORMAL LOW (ref >90)
Glucose, Bld: 169 mg/dL — ABNORMAL HIGH (ref 70–99)
Potassium: 4.4 mEq/L (ref 3.5–5.1)
Sodium: 134 mEq/L — ABNORMAL LOW (ref 135–145)

## 2012-05-04 LAB — GLUCOSE, CAPILLARY: Glucose-Capillary: 163 mg/dL — ABNORMAL HIGH (ref 70–99)

## 2012-05-04 NOTE — Progress Notes (Signed)
Physical Therapy Treatment Patient Details Name: Brian Bean MRN: [id number] DOB: 1941-02-05 Today's Date: 05/04/2012 Time: 7829-5621 PT Time Calculation (min): 39 min  PT Assessment / Plan / Recommendation Comments on Treatment Session  Patient progressing well with mobility and ambulation. Will attempt ambulation with crutches next visit per patient request.     Follow Up Recommendations  Home health PT;Supervision/Assistance - 24 hour     Does the patient have the potential to tolerate intense rehabilitation     Barriers to Discharge        Equipment Recommendations  3 in 1 bedside comode    Recommendations for Other Services    Frequency 7X/week   Plan Discharge plan remains appropriate;Frequency remains appropriate    Precautions / Restrictions Precautions Precautions: Posterior Hip Precaution Comments: Patient able to recall all precautions. Cues to maintain with mobility Restrictions RLE Weight Bearing: Weight bearing as tolerated   Pertinent Vitals/Pain     Mobility  Bed Mobility Supine to Sit: 5: Supervision Sitting - Scoot to Edge of Bed: 5: Supervision Details for Bed Mobility Assistance: Cues to maintain precautions.  Transfers Sit to Stand: 4: Min assist;With upper extremity assist;From bed Stand to Sit: 4: Min guard;With upper extremity assist;To chair/3-in-1 Details for Transfer Assistance: Cues for safe technique Ambulation/Gait Ambulation/Gait Assistance: 4: Min guard Ambulation Distance (Feet): 200 Feet Assistive device: Rolling walker Ambulation/Gait Assistance Details: Cues to push RW vs. picking it up. Patient wanting to attempt crutches next visit Gait Pattern: Step-to pattern;Decreased step length - right;Decreased step length - left;Trunk flexed    Exercises Total Joint Exercises Quad Sets: AROM;Right;15 reps Heel Slides: AAROM;Right;15 reps Hip ABduction/ADduction: AAROM;Right;15 reps   PT Diagnosis:    PT Problem List:   PT  Treatment Interventions:     PT Goals Acute Rehab PT Goals PT Goal: Supine/Side to Sit - Progress: Progressing toward goal PT Goal: Sit to Stand - Progress: Progressing toward goal PT Goal: Ambulate - Progress: Progressing toward goal PT Goal: Perform Home Exercise Program - Progress: Progressing toward goal  Visit Information  Last PT Received On: 05/04/12 Assistance Needed: +1    Subjective Data      Cognition  Overall Cognitive Status: Appears within functional limits for tasks assessed/performed Arousal/Alertness: Awake/alert Orientation Level: Appears intact for tasks assessed Behavior During Session: [facility] for tasks performed    Balance     End of Session PT - End of Session Equipment Utilized During Treatment: Gait belt Activity Tolerance: Patient tolerated treatment well Patient left: in chair Nurse Communication: Mobility status   GP     Fredrich Birks 05/04/2012, 9:55 AM 05/04/2012 Fredrich Birks PTA [phone] pager [phone] office

## 2012-05-04 NOTE — Progress Notes (Signed)
CARE MANAGEMENT NOTE 05/04/2012  Patient:  Brian Bean, Brian Bean   Account Number:  [id number]  Date Initiated:  05/04/2012  Documentation initiated by:  Vance Peper  Subjective/Objective Assessment:   71 yr old male s/p right total hip arthroplasty.     Action/Plan:   CM spoke with patient concerning home health and DME needs. Choice offered. Patient has rolling walker and 3in1.   Anticipated DC Date:  05/05/2012   Anticipated DC Plan:  HOME W HOME HEALTH SERVICES      DC Planning Services  CM consult      [facility] Choice  HOME HEALTH   Choice offered to / List presented to:  C-1 Patient        HH arranged  HH-2 PT      [facility] agency  [facility]   Status of service:  Completed, signed off Medicare Important Message given?   (If response is "NO", the following Medicare IM given date fields will be blank) Date Medicare IM given:   Date Additional Medicare IM given:    Discharge Disposition:    Per UR Regulation:    If discussed at Long Length of Stay Meetings, dates discussed:    Comments:

## 2012-05-04 NOTE — Progress Notes (Signed)
Physical Therapy Treatment Patient Details Name: Brian Bean MRN: [id number] DOB: [birth year] Today's Date: 05/04/2012 Time: 4098-1191 PT Time Calculation (min): 33 min  PT Assessment / Plan / Recommendation Comments on Treatment Session  Patient progressing very well this afternoon with mobility and increased ambulation. Patient requesting to attempt crutches next visit. Will need to attempt stairs next session    Follow Up Recommendations  Home health PT;Supervision/Assistance - 24 hour     Does the patient have the potential to tolerate intense rehabilitation     Barriers to Discharge        Equipment Recommendations  3 in 1 bedside comode    Recommendations for Other Services    Frequency 7X/week   Plan Discharge plan remains appropriate;Frequency remains appropriate    Precautions / Restrictions Precautions Precautions: Posterior Hip Restrictions RLE Weight Bearing: Weight bearing as tolerated   Pertinent Vitals/Pain     Mobility  Bed Mobility Supine to Sit: 5: Supervision Sitting - Scoot to Edge of Bed: 5: Supervision Sit to Supine: 5: Supervision Transfers Sit to Stand: 4: Min guard;With upper extremity assist;From bed Stand to Sit: 4: Min guard;With upper extremity assist;To bed Details for Transfer Assistance: Cues for safe hand placement and not to push up from RW Ambulation/Gait Ambulation/Gait Assistance: 4: Min guard Ambulation Distance (Feet): 500 Feet Assistive device: Rolling walker Ambulation/Gait Assistance Details: Cues to keep RW on the ground vs. picking it up . Patient to attempt ambulation with crutches in morning per his request Gait Pattern: Step-through pattern;Decreased stride length;Trunk flexed    Exercises Total Joint Exercises Quad Sets: AROM;Right;15 reps Heel Slides: AROM;Right;15 reps Hip ABduction/ADduction: AROM;Right;15 reps   PT Diagnosis:    PT Problem List:   PT Treatment Interventions:     PT Goals Acute Rehab PT  Goals PT Goal: Supine/Side to Sit - Progress: Progressing toward goal PT Goal: Sit to Supine/Side - Progress: Progressing toward goal PT Goal: Sit to Stand - Progress: Progressing toward goal PT Goal: Ambulate - Progress: Progressing toward goal PT Goal: Perform Home Exercise Program - Progress: Progressing toward goal  Visit Information  Last PT Received On: 05/04/12 Assistance Needed: +1    Subjective Data      Cognition  Overall Cognitive Status: Appears within functional limits for tasks assessed/performed Arousal/Alertness: Awake/alert Orientation Level: Appears intact for tasks assessed Behavior During Session: [facility] for tasks performed    Balance     End of Session PT - End of Session Equipment Utilized During Treatment: Gait belt Activity Tolerance: Patient tolerated treatment well Patient left: in bed;with call bell/phone within reach Nurse Communication: Mobility status   GP     Fredrich Birks 05/04/2012, 3:06 PM 05/04/2012 Fredrich Birks PTA [phone] pager [phone] office

## 2012-05-04 NOTE — Progress Notes (Signed)
Patient ID: Brian Bean, male   DOB: [birth year], 71 y.o.   MRN: [id number] PATIENT ID: Brian Bean  MRN: [id number]  DOB/AGE:  09-17-1940 / 71 y.o.  1 Day Post-Op Procedure(s) (LRB): TOTAL HIP ARTHROPLASTY (Right)    PROGRESS NOTE Subjective: Patient is alert, oriented,no Nausea, no Vomiting, yes passing gas, no Bowel Movement. Taking PO well. Denies SOB, Chest or Calf Pain. Using Incentive Spirometer, PAS in place. Ambulate WBAT Patient reports pain as 5 on 0-10 scale  .    Objective: Vital signs in last 24 hours: Filed Vitals:   05/03/12 2317 05/04/12 0000 05/04/12 0400 05/04/12 0446  BP: 125/78   122/69  Pulse: 75   80  Temp: 97.9 F (36.6 C)   98 F (36.7 C)  TempSrc:    Oral  Resp: 18 18 18 18   SpO2: 99% 99% 96% 95%      Intake/Output from previous day: I/O last 3 completed shifts: In: 1100 [I.V.:1100] Out: -    Intake/Output this shift:     LABORATORY DATA:  Basename 05/04/12 0617 05/04/12 0616  WBC -- 16.3*  HGB -- 10.9*  HCT -- 31.6*  PLT -- 174  NA -- 134*  K -- 4.4  CL -- 100  CO2 -- 26  BUN -- 22  CREATININE -- 0.83  GLUCOSE -- 169*  GLUCAP 163* --  INR -- --  CALCIUM -- 8.3*    Examination: Neurologically intact ABD soft Neurovascular intact Sensation intact distally Intact pulses distally Dorsiflexion/Plantar flexion intact Incision: moderate drainage No cellulitis present Compartment soft} XR AP&Lat of hip shows well placed\fixed THA  Assessment:   1 Day Post-Op Procedure(s) (LRB): TOTAL HIP ARTHROPLASTY (Right) ADDITIONAL DIAGNOSIS:    Plan: PT/OT WBAT, THA  posterior precautions  DVT Prophylaxis: SCDx72 hrs, ASA 325 mg BID x 2 weeks  DISCHARGE PLAN: Home  DISCHARGE NEEDS: HHPT, HHRN, CPM, Walker, 3-in-1 comode seat and IV Antibiotics

## 2012-05-05 DIAGNOSIS — M87059 Idiopathic aseptic necrosis of unspecified femur: Secondary | ICD-10-CM | POA: Diagnosis present

## 2012-05-05 LAB — CBC
HCT: 29.4 % — ABNORMAL LOW (ref 39.0–52.0)
Hemoglobin: 9.8 g/dL — ABNORMAL LOW (ref 13.0–17.0)
MCH: 30.3 pg (ref 26.0–34.0)
MCHC: 33.3 g/dL (ref 30.0–36.0)
MCV: 91 fL (ref 78.0–100.0)
Platelets: 139 10*3/uL — ABNORMAL LOW (ref 150–400)
RBC: 3.23 MIL/uL — ABNORMAL LOW (ref 4.22–5.81)
RDW: 14.3 % (ref 11.5–15.5)
WBC: 14.2 10*3/uL — ABNORMAL HIGH (ref 4.0–10.5)

## 2012-05-05 MED ORDER — OXYCODONE-ACETAMINOPHEN 5-325 MG PO TABS: 1.0000 | ORAL_TABLET | ORAL | Status: DC | PRN

## 2012-05-05 MED ORDER — ASPIRIN 325 MG PO TBEC: 325.0000 mg | DELAYED_RELEASE_TABLET | Freq: Two times a day (BID) | ORAL | Status: DC

## 2012-05-05 MED ORDER — CELECOXIB 200 MG PO CAPS: 200.0000 mg | ORAL_CAPSULE | Freq: Two times a day (BID) | ORAL | Status: DC

## 2012-05-05 MED ORDER — METHOCARBAMOL 500 MG PO TABS: 500.0000 mg | ORAL_TABLET | Freq: Four times a day (QID) | ORAL | Status: DC | PRN

## 2012-05-05 NOTE — Discharge Summary (Signed)
Patient ID: Brian Bean MRN: [id number] DOB/AGE: [birth year] 71 y.o.  Admit date: 05/03/2012 Discharge date: 05/05/2012  Admission Diagnoses:  Principal Problem:  *Avascular necrosis of right hip   Discharge Diagnoses:  Same  Past Medical History  Diagnosis Date  . PONV (postoperative nausea and vomiting)     extremely sick  . Hypertension   . Anxiety   . Cancer     skin lesion scalp  . Arthritis     Surgeries: Procedure(s): TOTAL HIP ARTHROPLASTY on 05/03/2012   Consultants:    Discharged Condition: Improved  Hospital Course: Brian Bean is an 71 y.o. male who was admitted 05/03/2012 for operative treatment ofAvascular necrosis of hip. Patient has severe unremitting pain that affects sleep, daily activities, and work/hobbies. After pre-op clearance the patient was taken to the operating room on 05/03/2012 and underwent  Procedure(s): TOTAL HIP ARTHROPLASTY.    Patient was given perioperative antibiotics: Anti-infectives     Start     Dose/Rate Route Frequency Ordered Stop   05/02/12 1315   ceFAZolin (ANCEF) IVPB 2 g/50 mL premix        2 g 100 mL/hr over 30 Minutes Intravenous 60 min pre-op 05/02/12 1315 05/03/12 1025           Patient was given sequential compression devices, early ambulation, and chemoprophylaxis to prevent DVT.  Patient benefited maximally from hospital stay and there were no complications.    Recent vital signs: Patient Vitals for the past 24 hrs:  BP Temp Temp src Pulse Resp SpO2  05/05/12 0559 102/50 mmHg 97.7 F (36.5 C) - 67  18  96 %  05/05/12 0400 - - - - 18  96 %  05/05/12 0200 112/66 mmHg 97.6 F (36.4 C) - 68  18  95 %  2012-06-02 2338 - - - - 16  97 %  02-Jun-2012 2239 124/60 mmHg 98.3 F (36.8 C) Oral 75  18  94 %  02-Jun-2012 2000 - - - - 18  95 %  06-02-2012 1130 122/73 mmHg 99.1 F (37.3 C) Oral 64  22  -  2012/06/02 1040 140/92 mmHg - - - - -  2012/06/02 0930 140/92 mmHg 99.1 F (37.3 C) Oral 74  18  97 %     Recent  laboratory studies:  [facility] 05/05/12 0638 Jun 02, 2012 0616  WBC 14.2* 16.3*  HGB 9.8* 10.9*  HCT 29.4* 31.6*  PLT 139* 174  NA -- 134*  K -- 4.4  CL -- 100  CO2 -- 26  BUN -- 22  CREATININE -- 0.83  GLUCOSE -- 169*  INR -- --  CALCIUM -- 8.3*     Discharge Medications:     Medication List     As of 05/05/2012  8:41 AM    STOP taking these medications         HYDROcodone-acetaminophen 7.5-750 MG per tablet   Commonly known as: VICODIN ES      naproxen sodium 220 MG tablet   Commonly known as: ANAPROX      TAKE these medications         aspirin 325 MG EC tablet   Take 1 tablet (325 mg total) by mouth 2 (two) times daily.      carvedilol 25 MG tablet   Commonly known as: COREG   Take 25 mg by mouth 2 (two) times daily with a meal.      celecoxib 200 MG capsule   Commonly known as: CELEBREX   Take 1 capsule (  200 mg total) by mouth every 12 (twelve) hours.      diltiazem 120 MG 12 hr capsule   Commonly known as: CARDIZEM SR   Take 120 mg by mouth 2 (two) times daily.      FLUoxetine 40 MG capsule   Commonly known as: PROZAC   Take 40 mg by mouth daily.      fosinopril 20 MG tablet   Commonly known as: MONOPRIL   Take 20 mg by mouth 2 (two) times daily.      hydrochlorothiazide 25 MG tablet   Commonly known as: HYDRODIURIL   Take 25 mg by mouth daily.      methocarbamol 500 MG tablet   Commonly known as: ROBAXIN   Take 1 tablet (500 mg total) by mouth every 6 (six) hours as needed.      oxyCODONE-acetaminophen 5-325 MG per tablet   Commonly known as: PERCOCET/ROXICET   Take 1-2 tablets by mouth every 4 (four) hours as needed for pain.      sildenafil 100 MG tablet   Commonly known as: VIAGRA   Take 100 mg by mouth daily as needed.        Diagnostic Studies: Dg Chest 2 View  04/28/2012  *RADIOLOGY REPORT*  Clinical Data: Hypertension  CHEST - 2 VIEW  Comparison: None.  Findings: The cardiac shadow is within normal limits.  The lungs are clear  bilaterally.  Old rib fractures are noted on the left. Postsurgical changes in the cervical spine are  IMPRESSION: No acute abnormality noted.   Original Report Authenticated By: Alcide Clever, M.D.    Dg Pelvis Portable  05/03/2012  *RADIOLOGY REPORT*  Clinical Data: Postop right hip  PORTABLE PELVIS  Comparison: None.  Findings: Right total hip arthroplasty.  No evidence of hardware complication.  Associated subcutaneous gas.  No fracture or dislocation is seen.  IMPRESSION: Satisfactory postoperative appearance status post right total hip arthroplasty.   Original Report Authenticated By: Charline Bills, M.D.    Dg Hip Portable 1 View Right  05/03/2012  *RADIOLOGY REPORT*  Clinical Data: Postop right hip  PORTABLE RIGHT HIP - 1 VIEW  Comparison: None.  Findings: Satisfactory alignment status post right total hip arthroplasty.  No evidence of hardware complication.  Associated subcutaneous gas.  No fracture or dislocation is seen.  IMPRESSION: Satisfactory alignment status post right total hip arthroplasty.   Original Report Authenticated By: Charline Bills, M.D.     Disposition: Final discharge disposition not confirmed      Discharge Orders    Future Orders Please Complete By Expires   Increase activity slowly      Walker       May shower / Bathe      Driving Restrictions      Comments:   No driving for 2 weeks.   Change dressing (specify)      Comments:   Dressing change as needed.   Call MD for:  temperature >100.4      Call MD for:  severe uncontrolled pain      Call MD for:  redness, tenderness, or signs of infection (pain, swelling, redness, odor or green/yellow discharge around incision site)      Discharge instructions      Comments:   F/U with Dr. Turner Daniels as scheduled [phone]         Signed: Hazle Nordmann. 05/05/2012, 8:41 AM

## 2012-05-05 NOTE — Progress Notes (Signed)
PATIENT ID: Brian Bean  MRN: [id number]  DOB/AGE:  1941-02-20 / 71 y.o.  2 Days Post-Op Procedure(s) (LRB): TOTAL HIP ARTHROPLASTY (Right)    PROGRESS NOTE Subjective: Patient is alert, oriented,no Nausea, no Vomiting, yes passing gas, no Bowel Movement. Taking PO well. Denies SOB, Chest or Calf Pain. Using Incentive Spirometer, PAS in place. Ambulating well with PT. Patient reports pain as moderate  .    Objective: Vital signs in last 24 hours: Filed Vitals:   05/04/12 2338 05/05/12 0200 05/05/12 0400 05/05/12 0559  BP:  112/66  102/50  Pulse:  68  67  Temp:  97.6 F (36.4 C)  97.7 F (36.5 C)  TempSrc:      Resp: 16 18 18 18   SpO2: 97% 95% 96% 96%      Intake/Output from previous day: I/O last 3 completed shifts: In: 1240 [P.O.:240; I.V.:1000] Out: 2050 [Urine:2050]   Intake/Output this shift:     LABORATORY DATA:  Basename 05/05/12 0638 05/04/12 0617 05/04/12 0616  WBC 14.2* -- 16.3*  HGB 9.8* -- 10.9*  HCT 29.4* -- 31.6*  PLT 139* -- 174  NA -- -- 134*  K -- -- 4.4  CL -- -- 100  CO2 -- -- 26  BUN -- -- 22  CREATININE -- -- 0.83  GLUCOSE -- -- 169*  GLUCAP -- 163* --  INR -- -- --  CALCIUM -- -- 8.3*    Examination: Neurologically intact ABD soft Neurovascular intact Sensation intact distally Intact pulses distally Dorsiflexion/Plantar flexion intact Incision: scant drainage} XR AP&Lat of hip shows well placed\fixed THA  Assessment:   2 Days Post-Op Procedure(s) (LRB): TOTAL HIP ARTHROPLASTY (Right) ADDITIONAL DIAGNOSIS:  none  Plan: PT/OT WBAT, THA  posterior precautions  DVT Prophylaxis: SCDx72 hrs, ASA 325 mg BID x 2 weeks  DISCHARGE PLAN: Home today if PT goals met.  DISCHARGE NEEDS: HHPT, HHRN, Walker and 3-in-1 comode seat

## 2012-05-05 NOTE — Evaluation (Signed)
Occupational Therapy Evaluation Patient Details Name: Brian Bean MRN: [id number] DOB: 20-Nov-1940 Today's Date: 05/05/2012 Time: 8657-8469 OT Time Calculation (min): 22 min  OT Assessment / Plan / Recommendation Clinical Impression  71 yo male s/p Rt THA that is progressing well at this time. OT to sign off.     OT Assessment  All further OT needs can be met in the next venue of care    Follow Up Recommendations  Home health OT    Barriers to Discharge      Equipment Recommendations  3 in 1 bedside comode    Recommendations for Other Services    Frequency       Precautions / Restrictions Precautions Precautions: Posterior Hip Precaution Comments: recalling all posterior hip precautions Restrictions RLE Weight Bearing: Weight bearing as tolerated   Pertinent Vitals/Pain No pain at this time    ADL  Lower Body Dressing:  (plans to have wife (A)) Toilet Transfer: Supervision/safety Toilet Transfer Method: Sit to stand Toilet Transfer Equipment: Raised toilet seat with arms (or 3-in-1 over toilet) Transfers/Ambulation Related to ADLs: supervision level with crutches. pt educated on using RW for shower transfers to decrease risk of fall.  ADL Comments: pt completed LB dressing with wife prior to OT arrival and without deficits. Pt able to return demo and verbalize education back to therapist using teach back. Pt and wife ready to d/c . RN Ivar Drape notified. PT and family informed that d/c could take up to 2 hours depending on paper work etc. Pt educated on reason for THP to prevent dislocation.    OT Diagnosis:    OT Problem List:   OT Treatment Interventions:     OT Goals    Visit Information  Last OT Received On: 05/05/12 Assistance Needed: +1    Subjective Data  Subjective: "I have her close by- she doesnt shower alone"- pt referring to wife assistance with shower Patient Stated Goal: to go home and hug on my wife   Prior Functioning     Home Living Lives  With: Significant other Available Help at Discharge: Family;Available 24 hours/day Type of Home: House Home Access: Stairs to enter [employer] of Steps: 4 Entrance Stairs-Rails: Right;Left Home Layout: One level Bathroom Shower/Tub: Health visitor: Handicapped height Bathroom Accessibility: Yes How Accessible: Accessible via walker Home Adaptive Equipment: Walker - rolling;Straight cane;Crutches Additional Comments: Pt somewhat inappropriate during all education into a sexual joke in nature. Pt provided education on shower transfer, bed mobility, chair transfer, safety with no driving at this time, and safety within home putting small dog up. Pt 's wife will be able to provide 24/ 7 (A) Prior Function Level of Independence: Independent Able to Take Stairs?: Yes Driving: Yes Vocation: Retired Musician: No difficulties Dominant Hand: Right         Vision/Perception     Cognition  Overall Cognitive Status: Appears within functional limits for tasks assessed/performed Arousal/Alertness: Awake/alert Orientation Level: Appears intact for tasks assessed Behavior During Session: [facility] for tasks performed    Extremity/Trunk Assessment Right Upper Extremity Assessment RUE ROM/Strength/Tone: [facility] for tasks assessed Left Upper Extremity Assessment LUE ROM/Strength/Tone: WFL for tasks assessed Trunk Assessment Trunk Assessment: Normal     Mobility Transfers Sit to Stand: 5: Supervision Stand to Sit: 5: Supervision     Shoulder Instructions     Exercise     Balance     End of Session OT - End of Session Activity Tolerance: Patient tolerated treatment well Patient left:  in chair;with call bell/phone within reach;with family/visitor present Nurse Communication: Mobility status;Precautions  GO     Lucile Shutters 05/05/2012, 10:31 AM Pager: [phone]

## 2012-05-05 NOTE — Progress Notes (Signed)
Physical Therapy Treatment Patient Details Name: Brian Bean MRN: [id number] DOB: 06-Apr-1941 Today's Date: 05/05/2012 Time: 0930-1000 PT Time Calculation (min): 30 min  PT Assessment / Plan / Recommendation Comments on Treatment Session  Patient progressed welll with ambulation with crutches. Eager to discharge     Follow Up Recommendations  Home health PT;Supervision/Assistance - 24 hour     Does the patient have the potential to tolerate intense rehabilitation     Barriers to Discharge        Equipment Recommendations  3 in 1 bedside comode    Recommendations for Other Services    Frequency 7X/week   Plan Discharge plan remains appropriate;Frequency remains appropriate    Precautions / Restrictions Precautions Precautions: Posterior Hip Precaution Comments: Patient able to recall all precautions Restrictions RLE Weight Bearing: Weight bearing as tolerated   Pertinent Vitals/Pain     Mobility  Transfers Sit to Stand: 5: Supervision Stand to Sit: 5: Supervision Details for Transfer Assistance: cues to sit slowly Ambulation/Gait Ambulation/Gait Assistance: 5: Supervision Ambulation Distance (Feet): 400 Feet Assistive device: Crutches Gait Pattern: Step-through pattern Stairs: Yes Stairs Assistance: 4: Min guard Stair Management Technique: Step to pattern;Forwards;With crutches Number of Stairs: 3     Exercises Total Joint Exercises Long Arc Quad: AROM;Right;15 reps   PT Diagnosis:    PT Problem List:   PT Treatment Interventions:     PT Goals Acute Rehab PT Goals PT Goal: Sit to Stand - Progress: Met PT Goal: Ambulate - Progress: Met PT Goal: Up/Down Stairs - Progress: Progressing toward goal PT Goal: Perform Home Exercise Program - Progress: Progressing toward goal  Visit Information  Last PT Received On: 05/05/12 Assistance Needed: +1    Subjective Data      Cognition  Overall Cognitive Status: Appears within functional limits for tasks  assessed/performed Arousal/Alertness: Awake/alert Orientation Level: Appears intact for tasks assessed Behavior During Session: [facility] for tasks performed    Balance     End of Session PT - End of Session Equipment Utilized During Treatment: Gait belt Activity Tolerance: Patient tolerated treatment well Patient left: in chair;with call bell/phone within reach Nurse Communication: Mobility status   GP     Fredrich Birks 05/05/2012, 11:18 AM  05/05/2012 Fredrich Birks PTA [phone] pager [phone] office

## 2012-05-18 ENCOUNTER — Other Ambulatory Visit: Payer: Self-pay | Admitting: Family Medicine

## 2012-05-18 ENCOUNTER — Other Ambulatory Visit: Payer: Medicare Other

## 2012-05-18 DIAGNOSIS — R52 Pain, unspecified: Secondary | ICD-10-CM

## 2012-05-19 ENCOUNTER — Ambulatory Visit
Admission: RE | Admit: 2012-05-19 | Discharge: 2012-05-19 | Disposition: A | Discharge status: Home / Self Care | Payer: Medicare Other | Source: Ambulatory Visit | Attending: Family Medicine | Admitting: Family Medicine

## 2012-05-19 DIAGNOSIS — R52 Pain, unspecified: Secondary | ICD-10-CM

## 2012-06-01 ENCOUNTER — Encounter: Payer: Self-pay | Admitting: Orthopedic Surgery

## 2012-06-09 ENCOUNTER — Encounter: Payer: Self-pay | Admitting: Orthopedic Surgery

## 2012-06-17 ENCOUNTER — Telehealth: Payer: Self-pay | Admitting: Oncology

## 2012-06-17 NOTE — Telephone Encounter (Signed)
S/W pt in re NP appt 1/30 @ 3 w/Dr. Gaylyn Rong. Referring Dr. Carolin Coy [facility] Welcome packet mailed.

## 2012-06-17 NOTE — Telephone Encounter (Signed)
C/D 06/17/12 for appt. 07/08/12

## 2012-07-06 ENCOUNTER — Encounter: Payer: Self-pay | Admitting: Oncology

## 2012-07-06 ENCOUNTER — Other Ambulatory Visit: Payer: Self-pay | Admitting: Oncology

## 2012-07-06 DIAGNOSIS — D72820 Lymphocytosis (symptomatic): Secondary | ICD-10-CM

## 2012-07-08 ENCOUNTER — Other Ambulatory Visit (HOSPITAL_BASED_OUTPATIENT_CLINIC_OR_DEPARTMENT_OTHER): Payer: Medicare Other | Admitting: Lab

## 2012-07-08 ENCOUNTER — Encounter: Payer: Self-pay | Admitting: Oncology

## 2012-07-08 ENCOUNTER — Telehealth: Payer: Self-pay | Admitting: Oncology

## 2012-07-08 ENCOUNTER — Other Ambulatory Visit (HOSPITAL_COMMUNITY)
Admission: RE | Admit: 2012-07-08 | Discharge: 2012-07-08 | Disposition: A | Discharge status: Home / Self Care | Payer: Medicare Other | Source: Ambulatory Visit | Attending: Oncology | Admitting: Oncology

## 2012-07-08 ENCOUNTER — Ambulatory Visit (HOSPITAL_BASED_OUTPATIENT_CLINIC_OR_DEPARTMENT_OTHER): Payer: Medicare Other | Admitting: Oncology

## 2012-07-08 ENCOUNTER — Ambulatory Visit: Payer: Medicare Other

## 2012-07-08 VITALS — BP 155/78 | HR 61 | Temp 97.9°F | Resp 18 | Ht 70.0 in | Wt 225.5 lb

## 2012-07-08 DIAGNOSIS — D72820 Lymphocytosis (symptomatic): Secondary | ICD-10-CM

## 2012-07-08 DIAGNOSIS — C8589 Other specified types of non-Hodgkin lymphoma, extranodal and solid organ sites: Secondary | ICD-10-CM | POA: Insufficient documentation

## 2012-07-08 LAB — CBC WITH DIFFERENTIAL/PLATELET
BASO%: 0.6 % (ref 0.0–2.0)
Basophils Absolute: 0.1 10*3/uL (ref 0.0–0.1)
EOS%: 0.9 % (ref 0.0–7.0)
Eosinophils Absolute: 0.1 10*3/uL (ref 0.0–0.5)
HCT: 40.1 % (ref 38.4–49.9)
HGB: 13.3 g/dL (ref 13.0–17.1)
LYMPH%: 61.2 % — ABNORMAL HIGH (ref 14.0–49.0)
MCH: 29.2 pg (ref 27.2–33.4)
MCHC: 33.1 g/dL (ref 32.0–36.0)
MCV: 88.1 fL (ref 79.3–98.0)
MONO#: 0.8 10*3/uL (ref 0.1–0.9)
MONO%: 4.5 % (ref 0.0–14.0)
NEUT#: 5.5 10*3/uL (ref 1.5–6.5)
NEUT%: 32.8 % — ABNORMAL LOW (ref 39.0–75.0)
Platelets: 194 10*3/uL (ref 140–400)
RBC: 4.55 10*6/uL (ref 4.20–5.82)
RDW: 14.3 % (ref 11.0–14.6)
WBC: 16.8 10*3/uL — ABNORMAL HIGH (ref 4.0–10.3)
lymph#: 10.3 10*3/uL — ABNORMAL HIGH (ref 0.9–3.3)

## 2012-07-08 LAB — CHCC SMEAR

## 2012-07-08 LAB — TECHNOLOGIST REVIEW

## 2012-07-08 LAB — LACTATE DEHYDROGENASE (CC13): LDH: 162 U/L (ref 125–245)

## 2012-07-08 NOTE — Telephone Encounter (Signed)
Gave pt appt for lab on April 2014 then July 2014 lab and ML and January 2015 Brian Bean and MD

## 2012-07-08 NOTE — Progress Notes (Signed)
Checked in new patient. No financial issues. °

## 2012-07-08 NOTE — Patient Instructions (Addendum)
1.  Issue:  Elevated lymphocytes (lymphocytosis).  2.  Potential cause:  Chronic lymphocytic leukemia (CLL) 3.  Prognosis:  The majority of CLL cases are indolent (not aggressive).  4.  Treatment (with chemo) are indicated in the following situations:  *  Very rapid growth of lymph nodes.  *  Recurrent infection.  *  Severe constitutional symptoms (fatigue, not eating, weight loss).  *  Low blood count (anemia, low platelet).  5.  Recommendation at this time:  Observation.  Repeat blood test in about 3 months.  Return visit in about 6 months.

## 2012-07-09 NOTE — Progress Notes (Signed)
[facility]  Telephone:(336) [phone] [phone]     INITIAL HEMATOLOGY CONSULTATION    Referral MD:  Elie Confer, M.D.   Reason for Referral: lymphocytosis.     HPI:  [name] is a 72 year-old man with no significant past medical history.  He recently was found to have persistent lymphocytosis.  He was kindly referred to the [facility] for evaluation.  [name] presented to the Clinic for the first time today with his girlfriend. He has chronic lower extremity skin rash from what he attributed to HCTZ.  He denied fever, anorexia, weight loss, fatigue, headache, visual changes, confusion, drenching night sweats, palpable lymph node swelling, mucositis, odynophagia, dysphagia, nausea vomiting, jaundice, chest pain, palpitation, shortness of breath, dyspnea on exertion, productive cough, gum bleeding, epistaxis, hematemesis, hemoptysis, abdominal pain, abdominal swelling, early satiety, melena, hematochezia, hematuria, skin rash, spontaneous bleeding, joint swelling, joint pain, heat or cold intolerance, bowel bladder incontinence, back pain, focal motor weakness, paresthesia, depression.      Past Medical History  Diagnosis Date  . PONV (postoperative nausea and vomiting)     extremely sick  . Hypertension   . Anxiety   . Cancer     skin lesion scalp  . Arthritis   . Lymphocytosis   :    Past Surgical History  Procedure Date  . Appendectomy   . Cervical disc surgery 00  . Fracture surgery     rt ankle  . Hernia repair     rt and  undistended testicle  . Rt hip 12    pinned  . Total hip arthroplasty 05/03/2012    Procedure: TOTAL HIP ARTHROPLASTY;  Surgeon: [name], MD;  Location: [facility] OR;  Service: Orthopedics;  Laterality: Right;  . Colonoscopy 2011    [facility].   :   CURRENT MEDS: Current Outpatient Prescriptions  Medication Sig Dispense Refill  . carvedilol (COREG) 25 MG tablet Take 25 mg by mouth 2 (two)  times daily with a meal.      . diltiazem (CARDIZEM SR) 120 MG 12 hr capsule Take 120 mg by mouth 2 (two) times daily.      Marland Kitchen FLUoxetine (PROZAC) 40 MG capsule Take 40 mg by mouth daily.      . fosinopril (MONOPRIL) 20 MG tablet Take 20 mg by mouth 2 (two) times daily.      . hydrochlorothiazide (HYDRODIURIL) 25 MG tablet Take 25 mg by mouth daily.      . methocarbamol (ROBAXIN) 500 MG tablet Take 1 tablet (500 mg total) by mouth every 6 (six) hours as needed.  60 tablet  0  . sildenafil (VIAGRA) 100 MG tablet Take 100 mg by mouth daily as needed.          No Known Allergies:  Family History  Problem Relation Age of Onset  . Heart disease Mother   . Cancer Father     unk type of cancer  :  History   Social History  . Marital Status: Legally Separated    Spouse Name: N/A    Number of Children: 1  . Years of Education: N/A   Occupational History  .      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 Smoking status: Former Smoker -- 0.5 packs/day for 10 years    Types: Cigarettes    Quit date: 04/28/1977  . Smokeless tobacco: Not on file     Comment: 10  oz alcohol wkly  . Alcohol Use: 2.4 oz/week    4 Glasses of wine per week  . Drug Use: No  . Sexually Active:    Other Topics Concern  . Not on file   Social History Narrative  . No narrative on file  :  REVIEW OF SYSTEM:  The rest of the 14-point review of sytem was negative.   Exam: ECOG 0.   General:  well-nourished in no acute distress.  Eyes:  no scleral icterus.  ENT:  There were no oropharyngeal lesions.  Neck was without thyromegaly.  Lymphatics:  Negative cervical, supraclavicular adenopathy.  He has a 2x1cm left axillary node, moblie, nontender. Respiratory: lungs were clear bilaterally without wheezing or crackles.  Cardiovascular:  Regular rate and rhythm, S1/S2, without murmur, rub or gallop.  There was no pedal edema.  GI:  abdomen was soft, flat, nontender, nondistended,  without organomegaly.  Muscoloskeletal:  no spinal tenderness of palpation of vertebral spine.  Skin exam was without echymosis, petichae.  Neuro exam was nonfocal.  Patient was able to get on and off exam table without assistance.  Gait was normal.  Patient was alerted and oriented.  Attention was good.   Language was appropriate.  Mood was normal without depression.  Speech was not pressured.  Thought content was not tangential.    LABS:  Lab Results  Component Value Date   WBC 16.8* 07/08/2012   HGB 13.3 07/08/2012   HCT 40.1 07/08/2012   PLT 194 07/08/2012   GLUCOSE 169* 05/04/2012   NA 134* 05/04/2012   K 4.4 05/04/2012   CL 100 05/04/2012   CREATININE 0.83 05/04/2012   BUN 22 05/04/2012   CO2 26 05/04/2012   INR 0.99 04/28/2012   Blood smear review:   I personally reviewed the patient's peripheral blood smear today.  There was isocytosis.  There was no peripheral blast. There were increased in lymphocytes and smudge cells.  There was no schistocytosis, spherocytosis, target cell, rouleaux formation, tear drop cell.  There was no giant platelets or platelet clumps.      ASSESSMENT AND PLAN:    1.  Issue:  Elevated lymphocytes (lymphocytosis).  2.  Potential cause:  Chronic lymphocytic leukemia (CLL) 3.  Prognosis:  The majority of CLL cases are indolent (not aggressive).  4.  Treatment (with chemo) are indicated in the following situations:  *  Very rapid growth of lymph nodes.  *  Recurrent infection.  *  Severe constitutional symptoms (fatigue, not eating, weight loss).  *  Low blood count (anemia, low platelet).  5.  Recommendation at this time:  Observation.  Repeat blood test in about 3 months.  Return visit in about 6 months.   [name] expressed informed understanding and agreed with watchful observation.     The length of time of the face-to-face encounter was 45 minutes. More than 50% of time was spent counseling and coordination of care.     Thank you for  this referral.

## 2012-07-10 ENCOUNTER — Encounter: Payer: Self-pay | Admitting: Orthopedic Surgery

## 2012-07-15 ENCOUNTER — Encounter: Payer: Self-pay | Admitting: Oncology

## 2012-07-15 LAB — FLOW CYTOMETRY

## 2012-08-07 ENCOUNTER — Encounter: Payer: Self-pay | Admitting: Orthopedic Surgery

## 2012-09-07 ENCOUNTER — Encounter: Payer: Self-pay | Admitting: Orthopedic Surgery

## 2012-10-06 ENCOUNTER — Other Ambulatory Visit (HOSPITAL_BASED_OUTPATIENT_CLINIC_OR_DEPARTMENT_OTHER): Payer: Medicare Other

## 2012-10-06 ENCOUNTER — Encounter: Payer: Self-pay | Admitting: Oncology

## 2012-10-06 DIAGNOSIS — D72820 Lymphocytosis (symptomatic): Secondary | ICD-10-CM

## 2012-10-06 LAB — BASIC METABOLIC PANEL (CC13)
BUN: 20.3 mg/dL (ref 7.0–26.0)
CO2: 25 mEq/L (ref 22–29)
Calcium: 9.1 mg/dL (ref 8.4–10.4)
Chloride: 105 mEq/L (ref 98–107)
Creatinine: 1 mg/dL (ref 0.7–1.3)
Glucose: 80 mg/dl (ref 70–99)
Potassium: 4.2 mEq/L (ref 3.5–5.1)
Sodium: 139 mEq/L (ref 136–145)

## 2012-10-06 LAB — CBC WITH DIFFERENTIAL/PLATELET
BASO%: 0.4 % (ref 0.0–2.0)
Basophils Absolute: 0.1 10*3/uL (ref 0.0–0.1)
EOS%: 0.6 % (ref 0.0–7.0)
Eosinophils Absolute: 0.1 10*3/uL (ref 0.0–0.5)
HCT: 41.9 % (ref 38.4–49.9)
HGB: 13.9 g/dL (ref 13.0–17.1)
LYMPH%: 58.1 % — ABNORMAL HIGH (ref 14.0–49.0)
MCH: 28.3 pg (ref 27.2–33.4)
MCHC: 33.2 g/dL (ref 32.0–36.0)
MCV: 85.4 fL (ref 79.3–98.0)
MONO#: 0.7 10*3/uL (ref 0.1–0.9)
MONO%: 4.4 % (ref 0.0–14.0)
NEUT#: 5.4 10*3/uL (ref 1.5–6.5)
NEUT%: 36.5 % — ABNORMAL LOW (ref 39.0–75.0)
Platelets: 169 10*3/uL (ref 140–400)
RBC: 4.91 10*6/uL (ref 4.20–5.82)
RDW: 16.5 % — ABNORMAL HIGH (ref 11.0–14.6)
WBC: 14.8 10*3/uL — ABNORMAL HIGH (ref 4.0–10.3)
lymph#: 8.6 10*3/uL — ABNORMAL HIGH (ref 0.9–3.3)

## 2012-10-06 LAB — TECHNOLOGIST REVIEW

## 2012-10-07 ENCOUNTER — Encounter: Payer: Self-pay | Admitting: Orthopedic Surgery

## 2012-11-08 ENCOUNTER — Encounter: Payer: Self-pay | Admitting: *Deleted

## 2012-11-08 NOTE — Progress Notes (Signed)
CBC results rec'd from [place].  WBC 13.3.  Forwarded to Dr. Gaylyn Rong for review.

## 2012-11-09 ENCOUNTER — Other Ambulatory Visit: Payer: Self-pay | Admitting: Oncology

## 2013-01-05 ENCOUNTER — Encounter: Payer: Self-pay | Admitting: Oncology

## 2013-01-05 ENCOUNTER — Other Ambulatory Visit (HOSPITAL_BASED_OUTPATIENT_CLINIC_OR_DEPARTMENT_OTHER): Payer: Medicare Other | Admitting: Lab

## 2013-01-05 ENCOUNTER — Ambulatory Visit (HOSPITAL_BASED_OUTPATIENT_CLINIC_OR_DEPARTMENT_OTHER): Payer: Medicare Other | Admitting: Oncology

## 2013-01-05 ENCOUNTER — Telehealth: Payer: Self-pay | Admitting: Oncology

## 2013-01-05 VITALS — BP 153/88 | HR 71 | Temp 98.1°F | Resp 18 | Ht 70.0 in | Wt 228.7 lb

## 2013-01-05 DIAGNOSIS — C9110 Chronic lymphocytic leukemia of B-cell type not having achieved remission: Secondary | ICD-10-CM

## 2013-01-05 DIAGNOSIS — D72820 Lymphocytosis (symptomatic): Secondary | ICD-10-CM

## 2013-01-05 LAB — CBC WITH DIFFERENTIAL/PLATELET
BASO%: 0.3 % (ref 0.0–2.0)
Basophils Absolute: 0 10*3/uL (ref 0.0–0.1)
EOS%: 0.8 % (ref 0.0–7.0)
Eosinophils Absolute: 0.1 10*3/uL (ref 0.0–0.5)
HCT: 41.6 % (ref 38.4–49.9)
HGB: 13.9 g/dL (ref 13.0–17.1)
LYMPH%: 63.7 % — ABNORMAL HIGH (ref 14.0–49.0)
MCH: 29.8 pg (ref 27.2–33.4)
MCHC: 33.5 g/dL (ref 32.0–36.0)
MCV: 88.8 fL (ref 79.3–98.0)
MONO#: 0.5 10*3/uL (ref 0.1–0.9)
MONO%: 3.7 % (ref 0.0–14.0)
NEUT#: 3.9 10*3/uL (ref 1.5–6.5)
NEUT%: 31.5 % — ABNORMAL LOW (ref 39.0–75.0)
Platelets: 154 10*3/uL (ref 140–400)
RBC: 4.68 10*6/uL (ref 4.20–5.82)
RDW: 14.3 % (ref 11.0–14.6)
WBC: 12.2 10*3/uL — ABNORMAL HIGH (ref 4.0–10.3)
lymph#: 7.8 10*3/uL — ABNORMAL HIGH (ref 0.9–3.3)

## 2013-01-05 LAB — COMPREHENSIVE METABOLIC PANEL (CC13)
ALT: 19 U/L (ref 0–55)
AST: 15 U/L (ref 5–34)
Albumin: 3.5 g/dL (ref 3.5–5.0)
Alkaline Phosphatase: 45 U/L (ref 40–150)
BUN: 16.2 mg/dL (ref 7.0–26.0)
CO2: 25 mEq/L (ref 22–29)
Calcium: 9 mg/dL (ref 8.4–10.4)
Chloride: 107 mEq/L (ref 98–109)
Creatinine: 1 mg/dL (ref 0.7–1.3)
Glucose: 91 mg/dl (ref 70–140)
Potassium: 4.4 mEq/L (ref 3.5–5.1)
Sodium: 139 mEq/L (ref 136–145)
Total Bilirubin: 0.45 mg/dL (ref 0.20–1.20)
Total Protein: 6.1 g/dL — ABNORMAL LOW (ref 6.4–8.3)

## 2013-01-05 LAB — LACTATE DEHYDROGENASE (CC13): LDH: 187 U/L (ref 125–245)

## 2013-01-05 LAB — TECHNOLOGIST REVIEW

## 2013-01-05 NOTE — Progress Notes (Signed)
[facility]  Telephone:(336) [phone] Fax:(336) [phone]   OFFICE PROGRESS NOTE   Cc:  Elie Confer, MD  DIAGNOSIS: CLL  CURRENT THERAPY: Watchful observation  INTERVAL HISTORY: Brian Bean 72 y.o. male returns for routine follow up with his girlfriend. He continued to feel well over the past 6 months. He denies recurrent infections, palpable lymph nodes, night sweats, fatigue, and weight loss.   He denied fever, anorexia, weight loss, fatigue, headache, visual changes, confusion, drenching night sweats, palpable lymph node swelling, mucositis, odynophagia, dysphagia, nausea vomiting, jaundice, chest pain, palpitation, shortness of breath, dyspnea on exertion, productive cough, gum bleeding, epistaxis, hematemesis, hemoptysis, abdominal pain, abdominal swelling, early satiety, melena, hematochezia, hematuria, skin rash, spontaneous bleeding, joint swelling, joint pain, heat or cold intolerance, bowel bladder incontinence, back pain, focal motor weakness, paresthesia, depression.    Past Medical History  Diagnosis Date  . PONV (postoperative nausea and vomiting)     extremely sick  . Hypertension   . Anxiety   . Cancer     skin lesion scalp  . Arthritis   . Lymphocytosis     Past Surgical History  Procedure Laterality Date  . Appendectomy    . Cervical disc surgery  00  . Fracture surgery      rt ankle  . Hernia repair      rt and  undistended testicle  . Rt hip  12    pinned  . Total hip arthroplasty  05/03/2012    Procedure: TOTAL HIP ARTHROPLASTY;  Surgeon: [name], MD;  Location: [facility] OR;  Service: Orthopedics;  Laterality: Right;  . Colonoscopy  2011    [facility].     Current Outpatient Prescriptions  Medication Sig Dispense Refill  . amLODipine (NORVASC) 5 MG tablet Take 10 mg by mouth daily.      . carvedilol (COREG) 25 MG tablet Take 25 mg by mouth 2 (two) times daily with a meal.      . FLUoxetine (PROZAC) 40 MG capsule Take 40  mg by mouth daily as needed.       . fosinopril (MONOPRIL) 20 MG tablet Take 20 mg by mouth 2 (two) times daily.      . hydrochlorothiazide (HYDRODIURIL) 25 MG tablet Take 25 mg by mouth daily.      . sildenafil (VIAGRA) 100 MG tablet Take 100 mg by mouth daily as needed.       No current facility-administered medications for this visit.    ALLERGIES:  has No Known Allergies.  REVIEW OF SYSTEMS:  The rest of the 14-point review of system was negative.   Filed Vitals:   01/05/13 1047  BP: 153/88  Pulse: 71  Temp: 98.1 F (36.7 C)  Resp: 18   Wt Readings from Last 3 Encounters:  01/05/13 228 lb 11.2 oz (103.738 kg)  07/08/12 225 lb 8 oz (102.286 kg)  04/28/12 219 lb 12.8 oz (99.701 kg)   ECOG Performance status: 0  PHYSICAL EXAMINATION:   General: well-nourished in no acute distress. Eyes: no scleral icterus. ENT: There were no oropharyngeal lesions. Neck was without thyromegaly. Lymphatics: Negative cervical, supraclavicular adenopathy. He has a 2x1cm left axillary node, moblie, nontender. Respiratory: lungs were clear bilaterally without wheezing or crackles. Cardiovascular: Regular rate and rhythm, S1/S2, without murmur, rub or gallop. There was no pedal edema. GI: abdomen was soft, flat, nontender, nondistended, without organomegaly. Muscoloskeletal: no spinal tenderness of palpation of vertebral spine. Skin exam was without echymosis, petichae. Neuro exam was  nonfocal. Patient was able to get on and off exam table without assistance. Gait was normal. Patient was alerted and oriented. Attention was good. Language was appropriate. Mood was normal without depression. Speech was not pressured. Thought content was not tangential.   LABORATORY/RADIOLOGY DATA:  Lab Results  Component Value Date   WBC 12.2* 01/05/2013   HGB 13.9 01/05/2013   HCT 41.6 01/05/2013   PLT 154 01/05/2013   GLUCOSE 91 01/05/2013   ALKPHOS 45 01/05/2013   ALT 19 01/05/2013   AST 15 01/05/2013   NA 139  01/05/2013   K 4.4 01/05/2013   CL 105 10/06/2012   CREATININE 1.0 01/05/2013   BUN 16.2 01/05/2013   CO2 25 01/05/2013   INR 0.99 04/28/2012    ASSESSMENT AND PLAN:   1. CLL: WBC remains stable. He has no symptoms attributable to CLL. I have explained to the patient that there is no indication for any treatment at this time. I recommend continued observation with labs in 3 months and return visit in 6 months. The patient is in agreement with this plan.  2. HTN: He in on amlodipine, carvedilol, fosinopril, and HCTZ per PCP.    The length of time of the face-to-face encounter was 15 minutes. More than 50% of time was spent counseling and coordination of care.

## 2013-01-05 NOTE — Telephone Encounter (Signed)
gv and printed appt sched and avs for pt  °

## 2013-01-06 ENCOUNTER — Encounter: Payer: Self-pay | Admitting: Oncology

## 2013-01-06 DIAGNOSIS — C9110 Chronic lymphocytic leukemia of B-cell type not having achieved remission: Secondary | ICD-10-CM

## 2013-01-06 HISTORY — DX: Chronic lymphocytic leukemia of B-cell type not having achieved remission: C91.10

## 2013-04-01 ENCOUNTER — Other Ambulatory Visit: Payer: Medicare Other

## 2013-04-01 ENCOUNTER — Telehealth: Payer: Self-pay | Admitting: Oncology

## 2013-04-01 NOTE — Telephone Encounter (Signed)
returned pt call and lvm with new d/t for lab...pt out of town

## 2013-04-04 ENCOUNTER — Telehealth: Payer: Self-pay | Admitting: Hematology and Oncology

## 2013-04-04 NOTE — Telephone Encounter (Signed)
returned pt call and advised to call back with r/s info

## 2013-04-08 ENCOUNTER — Encounter (INDEPENDENT_AMBULATORY_CARE_PROVIDER_SITE_OTHER): Payer: Self-pay

## 2013-04-08 ENCOUNTER — Other Ambulatory Visit (HOSPITAL_BASED_OUTPATIENT_CLINIC_OR_DEPARTMENT_OTHER): Payer: Medicare Other | Admitting: Lab

## 2013-04-08 DIAGNOSIS — D72820 Lymphocytosis (symptomatic): Secondary | ICD-10-CM

## 2013-04-08 DIAGNOSIS — C9110 Chronic lymphocytic leukemia of B-cell type not having achieved remission: Secondary | ICD-10-CM

## 2013-04-08 LAB — CBC WITH DIFFERENTIAL/PLATELET
BASO%: 0.3 % (ref 0.0–2.0)
Basophils Absolute: 0 10*3/uL (ref 0.0–0.1)
EOS%: 0.6 % (ref 0.0–7.0)
Eosinophils Absolute: 0.1 10*3/uL (ref 0.0–0.5)
HCT: 44.5 % (ref 38.4–49.9)
HGB: 14.9 g/dL (ref 13.0–17.1)
LYMPH%: 65.3 % — ABNORMAL HIGH (ref 14.0–49.0)
MCH: 29.7 pg (ref 27.2–33.4)
MCHC: 33.5 g/dL (ref 32.0–36.0)
MCV: 88.7 fL (ref 79.3–98.0)
MONO#: 0.6 10*3/uL (ref 0.1–0.9)
MONO%: 4.1 % (ref 0.0–14.0)
NEUT#: 4.2 10*3/uL (ref 1.5–6.5)
NEUT%: 29.7 % — ABNORMAL LOW (ref 39.0–75.0)
Platelets: 150 10*3/uL (ref 140–400)
RBC: 5.02 10*6/uL (ref 4.20–5.82)
RDW: 14 % (ref 11.0–14.6)
WBC: 14.1 10*3/uL — ABNORMAL HIGH (ref 4.0–10.3)
lymph#: 9.2 10*3/uL — ABNORMAL HIGH (ref 0.9–3.3)

## 2013-04-08 LAB — TECHNOLOGIST REVIEW

## 2013-04-13 ENCOUNTER — Telehealth: Payer: Self-pay | Admitting: Hematology and Oncology

## 2013-04-13 NOTE — Telephone Encounter (Signed)
Returned pt's call re message received from team lead, managed care. Pt stopped by after lb on 10/31 and wanted to have all lb only appts drawn @ [facility]. S/w pt and informed him that next appt for both lb/NG is scheduled for 06/24/13 and should he need any further lb only appts doing them @ [facility] would be addressed at that time. Pt voices understanding and was given new appt time for lb/NG 06/24/13 @ 9am. F/u was moved from CP2 to NG.

## 2013-06-24 ENCOUNTER — Other Ambulatory Visit (HOSPITAL_BASED_OUTPATIENT_CLINIC_OR_DEPARTMENT_OTHER): Payer: Medicare Other

## 2013-06-24 ENCOUNTER — Encounter: Payer: Self-pay | Admitting: Hematology and Oncology

## 2013-06-24 ENCOUNTER — Ambulatory Visit (HOSPITAL_BASED_OUTPATIENT_CLINIC_OR_DEPARTMENT_OTHER): Payer: Medicare Other | Admitting: Hematology and Oncology

## 2013-06-24 VITALS — BP 118/75 | HR 62 | Temp 98.1°F | Resp 18 | Ht 70.0 in | Wt 224.9 lb

## 2013-06-24 DIAGNOSIS — C9110 Chronic lymphocytic leukemia of B-cell type not having achieved remission: Secondary | ICD-10-CM

## 2013-06-24 DIAGNOSIS — D72820 Lymphocytosis (symptomatic): Secondary | ICD-10-CM

## 2013-06-24 LAB — CBC WITH DIFFERENTIAL/PLATELET
BASO%: 0.4 % (ref 0.0–2.0)
Basophils Absolute: 0.1 10*3/uL (ref 0.0–0.1)
EOS%: 0.6 % (ref 0.0–7.0)
Eosinophils Absolute: 0.1 10*3/uL (ref 0.0–0.5)
HCT: 44.8 % (ref 38.4–49.9)
HGB: 15.1 g/dL (ref 13.0–17.1)
LYMPH%: 64.6 % — ABNORMAL HIGH (ref 14.0–49.0)
MCH: 30.6 pg (ref 27.2–33.4)
MCHC: 33.8 g/dL (ref 32.0–36.0)
MCV: 90.4 fL (ref 79.3–98.0)
MONO#: 0.5 10*3/uL (ref 0.1–0.9)
MONO%: 3.2 % (ref 0.0–14.0)
NEUT#: 4.6 10*3/uL (ref 1.5–6.5)
NEUT%: 31.2 % — ABNORMAL LOW (ref 39.0–75.0)
Platelets: 143 10*3/uL (ref 140–400)
RBC: 4.95 10*6/uL (ref 4.20–5.82)
RDW: 14.4 % (ref 11.0–14.6)
WBC: 14.6 10*3/uL — ABNORMAL HIGH (ref 4.0–10.3)
lymph#: 9.4 10*3/uL — ABNORMAL HIGH (ref 0.9–3.3)

## 2013-06-24 LAB — COMPREHENSIVE METABOLIC PANEL (CC13)
ALT: 23 U/L (ref 0–55)
AST: 15 U/L (ref 5–34)
Albumin: 4 g/dL (ref 3.5–5.0)
Alkaline Phosphatase: 52 U/L (ref 40–150)
Anion Gap: 10 mEq/L (ref 3–11)
BUN: 15.8 mg/dL (ref 7.0–26.0)
CO2: 25 mEq/L (ref 22–29)
Calcium: 9.1 mg/dL (ref 8.4–10.4)
Chloride: 105 mEq/L (ref 98–109)
Creatinine: 0.9 mg/dL (ref 0.7–1.3)
Glucose: 107 mg/dl (ref 70–140)
Potassium: 4.1 mEq/L (ref 3.5–5.1)
Sodium: 139 mEq/L (ref 136–145)
Total Bilirubin: 0.64 mg/dL (ref 0.20–1.20)
Total Protein: 6.6 g/dL (ref 6.4–8.3)

## 2013-06-24 LAB — LACTATE DEHYDROGENASE (CC13): LDH: 181 U/L (ref 125–245)

## 2013-06-24 LAB — TECHNOLOGIST REVIEW

## 2013-06-25 NOTE — Progress Notes (Signed)
[facility] OFFICE PROGRESS NOTE  Patient Care Team: Carola Mitzi Davenport, MD as PCP - General (Family Medicine) Kerin Salen, MD as Attending Physician (Orthopedic Surgery) Heath Lark, MD as Consulting Physician (Hematology and Oncology)  DIAGNOSIS: CLL  SUMMARY OF ONCOLOGIC HISTORY: This is a pleasant 72 year old gentleman who was discovered to have CLL from routine blood test. He is being observed  INTERVAL HISTORY: Brian Bean 73 y.o. male returns for further followup. He denies any recent fever, chills, night sweats or abnormal weight loss Denies any new lymphadenopathy. Denies any recent recurrent infection ` I have reviewed the past medical history, past surgical history, social history and family history with the patient and they are unchanged from previous note.  ALLERGIES:  has No Known Allergies.  MEDICATIONS:  Current Outpatient Prescriptions  Medication Sig Dispense Refill  . amLODipine (NORVASC) 5 MG tablet Take 10 mg by mouth daily.      . carvedilol (COREG) 25 MG tablet Take 25 mg by mouth 2 (two) times daily with a meal.      . FLUoxetine (PROZAC) 40 MG capsule Take 40 mg by mouth daily as needed.       . fosinopril (MONOPRIL) 20 MG tablet Take 20 mg by mouth 2 (two) times daily.      . hydrochlorothiazide (HYDRODIURIL) 25 MG tablet Take 25 mg by mouth daily.      . Multiple Vitamins-Minerals (CENTRUM SILVER PO) Take by mouth.      . pravastatin (PRAVACHOL) 10 MG tablet Take 10 mg by mouth daily.      . sildenafil (VIAGRA) 100 MG tablet Take 100 mg by mouth daily as needed.      . tadalafil (CIALIS) 5 MG tablet Take 5 mg by mouth daily as needed for erectile dysfunction.       No current facility-administered medications for this visit.    REVIEW OF SYSTEMS:   Constitutional: Denies fevers, chills or abnormal weight loss Eyes: Denies blurriness of vision Ears, nose, mouth, throat, and face: Denies mucositis or sore throat Respiratory:  Denies cough, dyspnea or wheezes Cardiovascular: Denies palpitation, chest discomfort or lower extremity swelling Gastrointestinal:  Denies nausea, heartburn or change in bowel habits Skin: Denies abnormal skin rashes Lymphatics: Denies new lymphadenopathy or easy bruising Neurological:Denies numbness, tingling or new weaknesses Behavioral/Psych: Mood is stable, no new changes  All other systems were reviewed with the patient and are negative.  PHYSICAL EXAMINATION: ECOG PERFORMANCE STATUS: 0 - Asymptomatic  Filed Vitals:   06/24/13 0949  BP: 118/75  Pulse: 62  Temp: 98.1 F (36.7 C)  Resp: 18   Filed Weights   06/24/13 0949  Weight: 224 lb 14.4 oz (102.014 kg)    GENERAL:alert, no distress and comfortable SKIN: skin color, texture, turgor are normal, no rashes or significant lesions EYES: normal, Conjunctiva are pink and non-injected, sclera clear OROPHARYNX:no exudate, no erythema and lips, buccal mucosa, and tongue normal  NECK: supple, thyroid normal size, non-tender, without nodularity LYMPH:  no palpable lymphadenopathy in the cervical, axillary or inguinal LUNGS: clear to auscultation and percussion with normal breathing effort HEART: regular rate & rhythm and no murmurs and no lower extremity edema ABDOMEN:abdomen soft, non-tender and normal bowel sounds Musculoskeletal:no cyanosis of digits and no clubbing  NEURO: alert & oriented x 3 with fluent speech, no focal motor/sensory deficits  LABORATORY DATA:  I have reviewed the data as listed    Component Value Date/Time   NA 139 06/24/2013 7681  NA 134* 05/04/2012 0616   K 4.1 06/24/2013 0922   K 4.4 05/04/2012 0616   CL 105 10/06/2012 1047   CL 100 05/04/2012 0616   CO2 25 06/24/2013 0922   CO2 26 05/04/2012 0616   GLUCOSE 107 06/24/2013 0922   GLUCOSE 80 10/06/2012 1047   GLUCOSE 169* 05/04/2012 0616   BUN 15.8 06/24/2013 0922   BUN 22 05/04/2012 0616   CREATININE 0.9 06/24/2013 0922   CREATININE 0.83  05/04/2012 0616   CALCIUM 9.1 06/24/2013 0922   CALCIUM 8.3* 05/04/2012 0616   PROT 6.6 06/24/2013 0922   ALBUMIN 4.0 06/24/2013 0922   AST 15 06/24/2013 0922   ALT 23 06/24/2013 0922   ALKPHOS 52 06/24/2013 0922   BILITOT 0.64 06/24/2013 0922   GFRNONAA 88* 05/04/2012 0616   GFRAA >90 05/04/2012 0616    No results found for this basename: SPEP, UPEP,  kappa and lambda light chains    Lab Results  Component Value Date   WBC 14.6* 06/24/2013   NEUTROABS 4.6 06/24/2013   HGB 15.1 06/24/2013   HCT 44.8 06/24/2013   MCV 90.4 06/24/2013   PLT 143 06/24/2013      Chemistry      Component Value Date/Time   NA 139 06/24/2013 0922   NA 134* 05/04/2012 0616   K 4.1 06/24/2013 0922   K 4.4 05/04/2012 0616   CL 105 10/06/2012 1047   CL 100 05/04/2012 0616   CO2 25 06/24/2013 0922   CO2 26 05/04/2012 0616   BUN 15.8 06/24/2013 0922   BUN 22 05/04/2012 0616   CREATININE 0.9 06/24/2013 0922   CREATININE 0.83 05/04/2012 0616      Component Value Date/Time   CALCIUM 9.1 06/24/2013 0922   CALCIUM 8.3* 05/04/2012 0616   ALKPHOS 52 06/24/2013 0922   AST 15 06/24/2013 0922   ALT 23 06/24/2013 0922   BILITOT 0.64 06/24/2013 0922     ASSESSMENT & PLAN:  #1 CLL There is no evidence of disease progression Educated the patient's signs and symptoms to watch out for disease progression such as unexplained weight loss, progressive lymphadenopathy or unresolved infection the patient will call me sooner I will see in once a year only with history, physical examination and blood work #2 preventive care History see recent influenza vaccination. Recommending calcium and vitamin D. His preventive screening tests were up-to-date. Orders Placed This Encounter  Procedures  . CBC with Differential    Standing Status: Future     Number of Occurrences:      Standing Expiration Date: 03/16/2014  . Comprehensive metabolic panel    Standing Status: Future     Number of Occurrences:      Standing Expiration Date:  06/24/2014  . Lactate dehydrogenase    Standing Status: Future     Number of Occurrences:      Standing Expiration Date: 06/24/2014  . IgG, IgA, IgM    Standing Status: Future     Number of Occurrences:      Standing Expiration Date: 06/24/2014   All questions were answered. The patient knows to call the clinic with any problems, questions or concerns. No barriers to learning was detected. I spent 15 minutes counseling the patient face to face. The total time spent in the appointment was 20 minutes and more than 50% was on counseling and review of test results     [facility], [place], MD 06/25/2013 7:20 AM

## 2013-06-27 ENCOUNTER — Telehealth: Payer: Self-pay | Admitting: Hematology and Oncology

## 2013-06-27 NOTE — Telephone Encounter (Signed)
s.w. pt and advised on Jan 2016 appt...pt ok and awre

## 2013-07-08 ENCOUNTER — Ambulatory Visit: Payer: Medicare Other

## 2013-07-08 ENCOUNTER — Other Ambulatory Visit: Payer: Medicare Other | Admitting: Lab

## 2014-04-20 ENCOUNTER — Other Ambulatory Visit: Payer: Self-pay | Admitting: Hematology and Oncology

## 2014-04-20 DIAGNOSIS — C9110 Chronic lymphocytic leukemia of B-cell type not having achieved remission: Secondary | ICD-10-CM

## 2014-05-30 ENCOUNTER — Encounter: Payer: Self-pay | Admitting: Hematology and Oncology

## 2014-06-26 ENCOUNTER — Encounter: Payer: Self-pay | Admitting: Hematology and Oncology

## 2014-06-26 ENCOUNTER — Telehealth: Payer: Self-pay | Admitting: Hematology and Oncology

## 2014-06-26 ENCOUNTER — Ambulatory Visit (HOSPITAL_BASED_OUTPATIENT_CLINIC_OR_DEPARTMENT_OTHER): Payer: Medicare Other | Admitting: Hematology and Oncology

## 2014-06-26 ENCOUNTER — Other Ambulatory Visit (HOSPITAL_BASED_OUTPATIENT_CLINIC_OR_DEPARTMENT_OTHER): Payer: Medicare Other

## 2014-06-26 VITALS — BP 134/72 | HR 70 | Temp 97.8°F | Resp 18 | Ht 70.0 in | Wt 232.8 lb

## 2014-06-26 DIAGNOSIS — C9110 Chronic lymphocytic leukemia of B-cell type not having achieved remission: Secondary | ICD-10-CM

## 2014-06-26 DIAGNOSIS — D696 Thrombocytopenia, unspecified: Secondary | ICD-10-CM

## 2014-06-26 LAB — CBC WITH DIFFERENTIAL/PLATELET
BASO%: 0.2 % (ref 0.0–2.0)
Basophils Absolute: 0 10*3/uL (ref 0.0–0.1)
EOS%: 0.7 % (ref 0.0–7.0)
Eosinophils Absolute: 0.1 10*3/uL (ref 0.0–0.5)
HCT: 43.4 % (ref 38.4–49.9)
HGB: 14.3 g/dL (ref 13.0–17.1)
LYMPH%: 60.1 % — ABNORMAL HIGH (ref 14.0–49.0)
MCH: 30 pg (ref 27.2–33.4)
MCHC: 32.9 g/dL (ref 32.0–36.0)
MCV: 91 fL (ref 79.3–98.0)
MONO#: 0.5 10*3/uL (ref 0.1–0.9)
MONO%: 3.9 % (ref 0.0–14.0)
NEUT#: 4.5 10*3/uL (ref 1.5–6.5)
NEUT%: 35.1 % — ABNORMAL LOW (ref 39.0–75.0)
Platelets: 128 10*3/uL — ABNORMAL LOW (ref 140–400)
RBC: 4.77 10*6/uL (ref 4.20–5.82)
RDW: 13.7 % (ref 11.0–14.6)
WBC: 12.9 10*3/uL — ABNORMAL HIGH (ref 4.0–10.3)
lymph#: 7.8 10*3/uL — ABNORMAL HIGH (ref 0.9–3.3)

## 2014-06-26 LAB — COMPREHENSIVE METABOLIC PANEL (CC13)
ALT: 18 U/L (ref 0–55)
AST: 14 U/L (ref 5–34)
Albumin: 3.6 g/dL (ref 3.5–5.0)
Alkaline Phosphatase: 47 U/L (ref 40–150)
Anion Gap: 9 mEq/L (ref 3–11)
BUN: 19.6 mg/dL (ref 7.0–26.0)
CO2: 24 mEq/L (ref 22–29)
Calcium: 8.6 mg/dL (ref 8.4–10.4)
Chloride: 107 mEq/L (ref 98–109)
Creatinine: 1 mg/dL (ref 0.7–1.3)
EGFR: 73 mL/min/{1.73_m2} — ABNORMAL LOW (ref >90)
Glucose: 93 mg/dl (ref 70–140)
Potassium: 4.4 mEq/L (ref 3.5–5.1)
Sodium: 141 mEq/L (ref 136–145)
Total Bilirubin: 0.42 mg/dL (ref 0.20–1.20)
Total Protein: 6.3 g/dL — ABNORMAL LOW (ref 6.4–8.3)

## 2014-06-26 LAB — TECHNOLOGIST REVIEW

## 2014-06-26 LAB — LACTATE DEHYDROGENASE (CC13): LDH: 159 U/L (ref 125–245)

## 2014-06-26 NOTE — Assessment & Plan Note (Signed)
He remained asymptomatic. I do not believe the new thrombocytopenia is due to disease progression. I continue to recommend observation with history, physical examination and blood work on a yearly basis

## 2014-06-26 NOTE — Progress Notes (Signed)
[name]  Patient Care Team: Carola Mitzi Davenport, MD as PCP - General (Family Medicine) Kerin Salen, MD as Attending Physician (Orthopedic Surgery) Heath Lark, MD as Consulting Physician (Hematology and Oncology)  SUMMARY OF ONCOLOGIC HISTORY: This is a pleasant 74 year old gentleman who was discovered to have CLL from routine blood test. He is being observed  INTERVAL HISTORY: Brian Bean returns for further followup. He denies any recent fever, chills, night sweats or abnormal weight loss Denies any new lymphadenopathy. Denies any recent recurrent infection He has gained a lot of weight since I saw him.  INTERVAL HISTORY: Please see below for problem oriented charting.  REVIEW OF SYSTEMS:   Constitutional: Denies fevers, chills or abnormal weight loss Eyes: Denies blurriness of vision Ears, nose, mouth, throat, and face: Denies mucositis or sore throat Respiratory: Denies cough, dyspnea or wheezes Cardiovascular: Denies palpitation, chest discomfort or lower extremity swelling Gastrointestinal:  Denies nausea, heartburn or change in bowel habits Skin: Denies abnormal skin rashes Lymphatics: Denies new lymphadenopathy or easy bruising Neurological:Denies numbness, tingling or new weaknesses Behavioral/Psych: Mood is stable, no new changes  All other systems were reviewed with the patient and are negative.  I have reviewed the past medical history, past surgical history, social history and family history with the patient and they are unchanged from previous note.  ALLERGIES:  has No Known Allergies.  MEDICATIONS:  Current Outpatient Prescriptions  Medication Sig Dispense Refill  . amLODipine (NORVASC) 5 MG tablet Take 10 mg by mouth daily.    . carvedilol (COREG) 25 MG tablet Take 25 mg by mouth 2 (two) times daily with a meal.    . FLUoxetine (PROZAC) 40 MG capsule Take 40 mg by mouth daily as needed.     . hydrochlorothiazide  (HYDRODIURIL) 25 MG tablet Take 25 mg by mouth daily.    Marland Kitchen lisinopril (PRINIVIL,ZESTRIL) 10 MG tablet Take 10 mg by mouth daily.    . Multiple Vitamins-Minerals (CENTRUM SILVER PO) Take by mouth.    . pravastatin (PRAVACHOL) 10 MG tablet Take 10 mg by mouth daily.    . sildenafil (VIAGRA) 100 MG tablet Take 100 mg by mouth daily as needed.    . tadalafil (CIALIS) 5 MG tablet Take 5 mg by mouth daily as needed for erectile dysfunction.     No current facility-administered medications for this visit.    PHYSICAL EXAMINATION: ECOG PERFORMANCE STATUS: 0 - Asymptomatic  Filed Vitals:   06/26/14 0939  BP: 134/72  Pulse: 70  Temp: 97.8 F (36.6 C)  Resp: 18   Filed Weights   06/26/14 0939  Weight: 232 lb 12.8 oz (105.597 kg)    GENERAL:alert, no distress and comfortable. He is morbidly obese SKIN: skin color, texture, turgor are normal, no rashes or significant lesions EYES: normal, Conjunctiva are pink and non-injected, sclera clear OROPHARYNX:no exudate, no erythema and lips, buccal mucosa, and tongue normal  NECK: supple, thyroid normal size, non-tender, without nodularity LYMPH:  no palpable lymphadenopathy in the cervical, axillary or inguinal LUNGS: clear to auscultation and percussion with normal breathing effort HEART: regular rate & rhythm and no murmurs and no lower extremity edema ABDOMEN:abdomen soft, non-tender and normal bowel sounds Musculoskeletal:no cyanosis of digits and no clubbing  NEURO: alert & oriented x 3 with fluent speech, no focal motor/sensory deficits  LABORATORY DATA:  I have reviewed the data as listed    Component Value Date/Time   NA 141 06/26/2014 0918   NA 134*  05/04/2012 0616   K 4.4 06/26/2014 0918   K 4.4 05/04/2012 0616   CL 105 10/06/2012 1047   CL 100 05/04/2012 0616   CO2 24 06/26/2014 0918   CO2 26 05/04/2012 0616   GLUCOSE 93 06/26/2014 0918   GLUCOSE 80 10/06/2012 1047   GLUCOSE 169* 05/04/2012 0616   BUN 19.6 06/26/2014 0918    BUN 22 05/04/2012 0616   CREATININE 1.0 06/26/2014 0918   CREATININE 0.83 05/04/2012 0616   CALCIUM 8.6 06/26/2014 0918   CALCIUM 8.3* 05/04/2012 0616   PROT 6.3* 06/26/2014 0918   ALBUMIN 3.6 06/26/2014 0918   AST 14 06/26/2014 0918   ALT 18 06/26/2014 0918   ALKPHOS 47 06/26/2014 0918   BILITOT 0.42 06/26/2014 0918   GFRNONAA 88* 05/04/2012 0616   GFRAA >90 05/04/2012 0616    No results found for: SPEP, UPEP  Lab Results  Component Value Date   WBC 12.9* 06/26/2014   NEUTROABS 4.5 06/26/2014   HGB 14.3 06/26/2014   HCT 43.4 06/26/2014   MCV 91.0 06/26/2014   PLT 128* 06/26/2014      Chemistry      Component Value Date/Time   NA 141 06/26/2014 0918   NA 134* 05/04/2012 0616   K 4.4 06/26/2014 0918   K 4.4 05/04/2012 0616   CL 105 10/06/2012 1047   CL 100 05/04/2012 0616   CO2 24 06/26/2014 0918   CO2 26 05/04/2012 0616   BUN 19.6 06/26/2014 0918   BUN 22 05/04/2012 0616   CREATININE 1.0 06/26/2014 0918   CREATININE 0.83 05/04/2012 0616      Component Value Date/Time   CALCIUM 8.6 06/26/2014 0918   CALCIUM 8.3* 05/04/2012 0616   ALKPHOS 47 06/26/2014 0918   AST 14 06/26/2014 0918   ALT 18 06/26/2014 0918   BILITOT 0.42 06/26/2014 0918       ASSESSMENT & PLAN:  CLL (chronic lymphocytic leukemia) He remained asymptomatic. I do not believe the new thrombocytopenia is due to disease progression. I continue to recommend observation with history, physical examination and blood work on a yearly basis   Thrombocytopenia I suspect this is not related to CLL although ITP is known to be caused by CLL. I suspect this could be due to mild fatty liver congestion from morbid obesity. I recommend observation only and discontinuation of alcohol intake if possible.    Orders Placed This Encounter  Procedures  . CBC with Differential    Standing Status: Future     Number of Occurrences:      Standing Expiration Date: 07/31/2015   All questions were answered.  The patient knows to call the clinic with any problems, questions or concerns. No barriers to learning was detected. I spent 15 minutes counseling the patient face to face. The total time spent in the appointment was 20 minutes and more than 50% was on counseling and review of test results     [facility], [place], MD 06/26/2014 10:44 AM

## 2014-06-26 NOTE — Telephone Encounter (Signed)
Gave avs & cal for Jan 2017

## 2014-06-26 NOTE — Assessment & Plan Note (Signed)
I suspect this is not related to CLL although ITP is known to be caused by CLL. I suspect this could be due to mild fatty liver congestion from morbid obesity. I recommend observation only and discontinuation of alcohol intake if possible.

## 2014-06-27 LAB — IGG, IGA, IGM
IgA: 188 mg/dL (ref 68–379)
IgG (Immunoglobin G), Serum: 577 mg/dL — ABNORMAL LOW (ref 650–1600)
IgM, Serum: 21 mg/dL — ABNORMAL LOW (ref 41–251)

## 2015-05-25 ENCOUNTER — Ambulatory Visit
Admission: RE | Admit: 2015-05-25 | Discharge: 2015-05-25 | Disposition: A | Discharge status: Home / Self Care | Payer: Medicare Other | Source: Ambulatory Visit | Attending: Nurse Practitioner | Admitting: Nurse Practitioner

## 2015-05-25 ENCOUNTER — Other Ambulatory Visit: Payer: Self-pay | Admitting: Nurse Practitioner

## 2015-05-25 DIAGNOSIS — G5682 Other specified mononeuropathies of left upper limb: Secondary | ICD-10-CM

## 2015-06-25 ENCOUNTER — Telehealth: Payer: Self-pay | Admitting: Hematology and Oncology

## 2015-06-25 ENCOUNTER — Other Ambulatory Visit (HOSPITAL_BASED_OUTPATIENT_CLINIC_OR_DEPARTMENT_OTHER): Payer: Medicare Other

## 2015-06-25 ENCOUNTER — Ambulatory Visit (HOSPITAL_BASED_OUTPATIENT_CLINIC_OR_DEPARTMENT_OTHER): Payer: Medicare Other | Admitting: Hematology and Oncology

## 2015-06-25 ENCOUNTER — Encounter: Payer: Self-pay | Admitting: Hematology and Oncology

## 2015-06-25 VITALS — BP 134/73 | HR 66 | Temp 98.6°F | Resp 18 | Ht 70.0 in | Wt 233.9 lb

## 2015-06-25 DIAGNOSIS — D696 Thrombocytopenia, unspecified: Secondary | ICD-10-CM | POA: Diagnosis not present

## 2015-06-25 DIAGNOSIS — C9110 Chronic lymphocytic leukemia of B-cell type not having achieved remission: Secondary | ICD-10-CM

## 2015-06-25 LAB — TECHNOLOGIST REVIEW

## 2015-06-25 LAB — CBC WITH DIFFERENTIAL/PLATELET
BASO%: 0.4 % (ref 0.0–2.0)
Basophils Absolute: 0.1 10*3/uL (ref 0.0–0.1)
EOS%: 0.5 % (ref 0.0–7.0)
Eosinophils Absolute: 0.1 10*3/uL (ref 0.0–0.5)
HCT: 44.7 % (ref 38.4–49.9)
HGB: 15 g/dL (ref 13.0–17.1)
LYMPH%: 69.1 % — ABNORMAL HIGH (ref 14.0–49.0)
MCH: 30.3 pg (ref 27.2–33.4)
MCHC: 33.5 g/dL (ref 32.0–36.0)
MCV: 90.6 fL (ref 79.3–98.0)
MONO#: 0.5 10*3/uL (ref 0.1–0.9)
MONO%: 3.1 % (ref 0.0–14.0)
NEUT#: 4.6 10*3/uL (ref 1.5–6.5)
NEUT%: 26.9 % — ABNORMAL LOW (ref 39.0–75.0)
Platelets: 129 10*3/uL — ABNORMAL LOW (ref 140–400)
RBC: 4.94 10*6/uL (ref 4.20–5.82)
RDW: 14 % (ref 11.0–14.6)
WBC: 17 10*3/uL — ABNORMAL HIGH (ref 4.0–10.3)
lymph#: 11.7 10*3/uL — ABNORMAL HIGH (ref 0.9–3.3)

## 2015-06-25 NOTE — Telephone Encounter (Signed)
Talked to patient here in office. Scheduled appt.       [facility]. °

## 2015-06-25 NOTE — Assessment & Plan Note (Signed)
He remained asymptomatic. I do not believe the new thrombocytopenia is due to disease progression. I continue to recommend observation with history, physical examination and blood work on a yearly basis 

## 2015-06-25 NOTE — Progress Notes (Signed)
[place] OFFICE PROGRESS NOTE  Patient Care Team: Angelina Pih, MD as PCP - General (Family Medicine) Frederik Pear, MD as Attending Physician (Orthopedic Surgery) Heath Lark, MD as Consulting Physician (Hematology and Oncology)  SUMMARY OF ONCOLOGIC HISTORY:   CLL (chronic lymphocytic leukemia) ([place])   07/08/2012 Pathology Results Accession #: [id number]  peripheral blood comfirmed CLL    INTERVAL HISTORY: Please see below for problem oriented charting.  he returns for further follow-up. He feels well. Denies recent infection. No new lymphadenopathy, anorexia or weight loss. The patient denies any recent signs or symptoms of bleeding such as spontaneous epistaxis, hematuria or hematochezia.   REVIEW OF SYSTEMS:   Constitutional: Denies fevers, chills or abnormal weight loss Eyes: Denies blurriness of vision Ears, nose, mouth, throat, and face: Denies mucositis or sore throat Respiratory: Denies cough, dyspnea or wheezes Cardiovascular: Denies palpitation, chest discomfort or lower extremity swelling Gastrointestinal:  Denies nausea, heartburn or change in bowel habits Skin: Denies abnormal skin rashes Lymphatics: Denies new lymphadenopathy or easy bruising Neurological:Denies numbness, tingling or new weaknesses Behavioral/Psych: Mood is stable, no new changes  All other systems were reviewed with the patient and are negative.  I have reviewed the past medical history, past surgical history, social history and family history with the patient and they are unchanged from previous note.  ALLERGIES:  has No Known Allergies.  MEDICATIONS:  Current Outpatient Prescriptions  Medication Sig Dispense Refill  . amLODipine (NORVASC) 5 MG tablet Take 5 mg by mouth 2 (two) times daily.     Marland Kitchen aspirin 81 MG tablet Take 81 mg by mouth daily.    . carvedilol (COREG) 25 MG tablet Take 25 mg by mouth 2 (two) times daily with a meal.    . FLUoxetine (PROZAC) 40 MG capsule  Take 40 mg by mouth daily as needed.     Marland Kitchen lisinopril (PRINIVIL,ZESTRIL) 10 MG tablet Take 10 mg by mouth daily.    . Multiple Vitamins-Minerals (CENTRUM SILVER PO) Take by mouth.    . pravastatin (PRAVACHOL) 10 MG tablet Take 10 mg by mouth daily.    . sildenafil (VIAGRA) 100 MG tablet Take 100 mg by mouth daily as needed.    . tadalafil (CIALIS) 5 MG tablet Take 5 mg by mouth daily as needed for erectile dysfunction.     No current facility-administered medications for this visit.    PHYSICAL EXAMINATION: ECOG PERFORMANCE STATUS: 0 - Asymptomatic  Filed Vitals:   06/25/15 0914  BP: 134/73  Pulse: 66  Temp: 98.6 F (37 C)  Resp: 18   Filed Weights   06/25/15 0914  Weight: 233 lb 14.4 oz (106.096 kg)    GENERAL:alert, no distress and comfortable SKIN: skin color, texture, turgor are normal, no rashes or significant lesions EYES: normal, Conjunctiva are pink and non-injected, sclera clear OROPHARYNX:no exudate, no erythema and lips, buccal mucosa, and tongue normal  NECK: supple, thyroid normal size, non-tender, without nodularity LYMPH:  no palpable lymphadenopathy in the cervical, axillary or inguinal LUNGS: clear to auscultation and percussion with normal breathing effort HEART: regular rate & rhythm and no murmurs and no lower extremity edema ABDOMEN:abdomen soft, non-tender and normal bowel sounds. He has central obesity. No palpable splenomegaly. Musculoskeletal:no cyanosis of digits and no clubbing  NEURO: alert & oriented x 3 with fluent speech, no focal motor/sensory deficits  LABORATORY DATA:  I have reviewed the data as listed    Component Value Date/Time   NA 141 06/26/2014 0918  NA 134* 05/04/2012 0616   K 4.4 06/26/2014 0918   K 4.4 05/04/2012 0616   CL 105 10/06/2012 1047   CL 100 05/04/2012 0616   CO2 24 06/26/2014 0918   CO2 26 05/04/2012 0616   GLUCOSE 93 06/26/2014 0918   GLUCOSE 80 10/06/2012 1047   GLUCOSE 169* 05/04/2012 0616   BUN 19.6  06/26/2014 0918   BUN 22 05/04/2012 0616   CREATININE 1.0 06/26/2014 0918   CREATININE 0.83 05/04/2012 0616   CALCIUM 8.6 06/26/2014 0918   CALCIUM 8.3* 05/04/2012 0616   PROT 6.3* 06/26/2014 0918   ALBUMIN 3.6 06/26/2014 0918   AST 14 06/26/2014 0918   ALT 18 06/26/2014 0918   ALKPHOS 47 06/26/2014 0918   BILITOT 0.42 06/26/2014 0918   GFRNONAA 88* 05/04/2012 0616   GFRAA >90 05/04/2012 0616    No results found for: SPEP, UPEP  Lab Results  Component Value Date   WBC 17.0* 06/25/2015   NEUTROABS 4.6 06/25/2015   HGB 15.0 06/25/2015   HCT 44.7 06/25/2015   MCV 90.6 06/25/2015   PLT 129* 06/25/2015      Chemistry      Component Value Date/Time   NA 141 06/26/2014 0918   NA 134* 05/04/2012 0616   K 4.4 06/26/2014 0918   K 4.4 05/04/2012 0616   CL 105 10/06/2012 1047   CL 100 05/04/2012 0616   CO2 24 06/26/2014 0918   CO2 26 05/04/2012 0616   BUN 19.6 06/26/2014 0918   BUN 22 05/04/2012 0616   CREATININE 1.0 06/26/2014 0918   CREATININE 0.83 05/04/2012 0616      Component Value Date/Time   CALCIUM 8.6 06/26/2014 0918   CALCIUM 8.3* 05/04/2012 0616   ALKPHOS 47 06/26/2014 0918   AST 14 06/26/2014 0918   ALT 18 06/26/2014 0918   BILITOT 0.42 06/26/2014 0918      ASSESSMENT & PLAN:  CLL (chronic lymphocytic leukemia) He remained asymptomatic. I do not believe the new thrombocytopenia is due to disease progression. I continue to recommend observation with history, physical examination and blood work on a yearly basis    Thrombocytopenia I suspect this is not related to CLL although ITP is known to be caused by CLL. I suspect this could be due to mild fatty liver congestion from morbid obesity. I recommend observation only and discontinuation of alcohol intake if possible and weight loss program   Orders Placed This Encounter  Procedures  . CBC with Differential/Platelet    Standing Status: Future     Number of Occurrences:      Standing Expiration  Date: 07/29/2016  . Comprehensive metabolic panel    Standing Status: Future     Number of Occurrences:      Standing Expiration Date: 07/29/2016  . FISH, Peripheral Blood    CLL panel    Standing Status: Future     Number of Occurrences:      Standing Expiration Date: 07/29/2016   All questions were answered. The patient knows to call the clinic with any problems, questions or concerns. No barriers to learning was detected. I spent 15 minutes counseling the patient face to face. The total time spent in the appointment was 20 minutes and more than 50% was on counseling and review of test results     [facility], [place], MD 06/25/2015 1:25 PM

## 2015-06-25 NOTE — Assessment & Plan Note (Signed)
I suspect this is not related to CLL although ITP is known to be caused by CLL. I suspect this could be due to mild fatty liver congestion from morbid obesity. I recommend observation only and discontinuation of alcohol intake if possible and weight loss program

## 2016-04-17 ENCOUNTER — Other Ambulatory Visit: Payer: Self-pay | Admitting: Urology

## 2016-04-17 DIAGNOSIS — C61 Malignant neoplasm of prostate: Secondary | ICD-10-CM

## 2016-05-16 ENCOUNTER — Ambulatory Visit
Admission: RE | Admit: 2016-05-16 | Discharge: 2016-05-16 | Disposition: A | Discharge status: Home / Self Care | Payer: Medicare Other | Source: Ambulatory Visit | Attending: Urology | Admitting: Urology

## 2016-05-16 DIAGNOSIS — C61 Malignant neoplasm of prostate: Secondary | ICD-10-CM

## 2016-05-16 MED ORDER — GADOBENATE DIMEGLUMINE 529 MG/ML IV SOLN
20.0000 mL | Freq: Once | INTRAVENOUS | Status: AC | PRN
  Administered 2016-05-16: 20 mL via INTRAVENOUS

## 2016-06-24 ENCOUNTER — Telehealth: Payer: Self-pay | Admitting: Hematology and Oncology

## 2016-06-24 ENCOUNTER — Ambulatory Visit (HOSPITAL_BASED_OUTPATIENT_CLINIC_OR_DEPARTMENT_OTHER): Payer: Medicare Other | Admitting: Hematology and Oncology

## 2016-06-24 ENCOUNTER — Ambulatory Visit (HOSPITAL_BASED_OUTPATIENT_CLINIC_OR_DEPARTMENT_OTHER): Payer: Medicare Other

## 2016-06-24 VITALS — BP 135/76 | HR 66 | Temp 98.7°F | Resp 18 | Ht 70.0 in | Wt 234.3 lb

## 2016-06-24 DIAGNOSIS — C9110 Chronic lymphocytic leukemia of B-cell type not having achieved remission: Secondary | ICD-10-CM | POA: Diagnosis not present

## 2016-06-24 DIAGNOSIS — R590 Localized enlarged lymph nodes: Secondary | ICD-10-CM | POA: Diagnosis not present

## 2016-06-24 DIAGNOSIS — C61 Malignant neoplasm of prostate: Secondary | ICD-10-CM

## 2016-06-24 DIAGNOSIS — D696 Thrombocytopenia, unspecified: Secondary | ICD-10-CM

## 2016-06-24 LAB — CBC WITH DIFFERENTIAL/PLATELET
BASO%: 0.6 % (ref 0.0–2.0)
Basophils Absolute: 0.1 10*3/uL (ref 0.0–0.1)
EOS%: 0.8 % (ref 0.0–7.0)
Eosinophils Absolute: 0.1 10*3/uL (ref 0.0–0.5)
HCT: 43.8 % (ref 38.4–49.9)
HGB: 14.7 g/dL (ref 13.0–17.1)
LYMPH%: 66.2 % — ABNORMAL HIGH (ref 14.0–49.0)
MCH: 30.5 pg (ref 27.2–33.4)
MCHC: 33.5 g/dL (ref 32.0–36.0)
MCV: 91 fL (ref 79.3–98.0)
MONO#: 0.7 10*3/uL (ref 0.1–0.9)
MONO%: 4.1 % (ref 0.0–14.0)
NEUT#: 5.1 10*3/uL (ref 1.5–6.5)
NEUT%: 28.3 % — ABNORMAL LOW (ref 39.0–75.0)
Platelets: 157 10*3/uL (ref 140–400)
RBC: 4.81 10*6/uL (ref 4.20–5.82)
RDW: 14.1 % (ref 11.0–14.6)
WBC: 18.1 10*3/uL — ABNORMAL HIGH (ref 4.0–10.3)
lymph#: 12 10*3/uL — ABNORMAL HIGH (ref 0.9–3.3)

## 2016-06-24 LAB — COMPREHENSIVE METABOLIC PANEL
ALT: 17 U/L (ref 0–55)
AST: 16 U/L (ref 5–34)
Albumin: 3.7 g/dL (ref 3.5–5.0)
Alkaline Phosphatase: 55 U/L (ref 40–150)
Anion Gap: 9 mEq/L (ref 3–11)
BUN: 17.8 mg/dL (ref 7.0–26.0)
CO2: 24 mEq/L (ref 22–29)
Calcium: 9.2 mg/dL (ref 8.4–10.4)
Chloride: 103 mEq/L (ref 98–109)
Creatinine: 0.9 mg/dL (ref 0.7–1.3)
EGFR: 86 mL/min/{1.73_m2} — ABNORMAL LOW (ref >90)
Glucose: 104 mg/dl (ref 70–140)
Potassium: 3.9 mEq/L (ref 3.5–5.1)
Sodium: 136 mEq/L (ref 136–145)
Total Bilirubin: 0.51 mg/dL (ref 0.20–1.20)
Total Protein: 6.5 g/dL (ref 6.4–8.3)

## 2016-06-24 LAB — TECHNOLOGIST REVIEW

## 2016-06-24 NOTE — Progress Notes (Signed)
[place] OFFICE PROGRESS NOTE  Patient Care Team: Lajean Manes, MD as PCP - General (Internal Medicine) Frederik Pear, MD as Attending Physician (Orthopedic Surgery) Heath Lark, MD as Consulting Physician (Hematology and Oncology)  SUMMARY OF ONCOLOGIC HISTORY:   CLL (chronic lymphocytic leukemia) ([place])   07/08/2012 Pathology Results    Accession #: [id number]  peripheral blood comfirmed CLL       INTERVAL HISTORY: Please see below for problem oriented charting. The patient returns here for further follow-up He was diagnosed with CLL and under observation He denies new lymphadenopathy, anorexia, weight loss or recurrent infection He was diagnosed with prostate cancer undergoing close surveillance with a urologist Recent MRI showed pelvic lymphadenopathy but no other signs of metastatic disease  REVIEW OF SYSTEMS:   Constitutional: Denies fevers, chills or abnormal weight loss Eyes: Denies blurriness of vision Ears, nose, mouth, throat, and face: Denies mucositis or sore throat Respiratory: Denies cough, dyspnea or wheezes Cardiovascular: Denies palpitation, chest discomfort or lower extremity swelling Gastrointestinal:  Denies nausea, heartburn or change in bowel habits Skin: Denies abnormal skin rashes Neurological:Denies numbness, tingling or new weaknesses Behavioral/Psych: Mood is stable, no new changes  All other systems were reviewed with the patient and are negative.  I have reviewed the past medical history, past surgical history, social history and family history with the patient and they are unchanged from previous note.  ALLERGIES:  has No Known Allergies.  MEDICATIONS:  Current Outpatient Prescriptions  Medication Sig Dispense Refill  . amLODipine (NORVASC) 5 MG tablet Take 5 mg by mouth 2 (two) times daily.     Marland Kitchen aspirin 81 MG tablet Take 81 mg by mouth daily.    . carvedilol (COREG) 25 MG tablet Take 25 mg by mouth 2 (two) times daily with a  meal.    . Cholecalciferol (VITAMIN D3) 1000 units CAPS Take 1,000 Units by mouth.    Marland Kitchen FLUoxetine (PROZAC) 40 MG capsule Take 40 mg by mouth daily as needed.     Marland Kitchen lisinopril (PRINIVIL,ZESTRIL) 10 MG tablet Take 10 mg by mouth daily.    . Multiple Vitamins-Minerals (CENTRUM SILVER PO) Take by mouth.    . pravastatin (PRAVACHOL) 10 MG tablet Take 10 mg by mouth daily.    . sildenafil (VIAGRA) 100 MG tablet Take 100 mg by mouth daily as needed.    . tadalafil (CIALIS) 5 MG tablet Take 5 mg by mouth daily as needed for erectile dysfunction.     No current facility-administered medications for this visit.     PHYSICAL EXAMINATION: ECOG PERFORMANCE STATUS: 0 - Asymptomatic  Vitals:   06/24/16 1029  BP: 135/76  Pulse: 66  Resp: 18  Temp: 98.7 F (37.1 C)   Filed Weights   06/24/16 1029  Weight: 234 lb 4.8 oz (106.3 kg)    GENERAL:alert, no distress and comfortable SKIN: skin color, texture, turgor are normal, no rashes or significant lesions EYES: normal, Conjunctiva are pink and non-injected, sclera clear OROPHARYNX:no exudate, no erythema and lips, buccal mucosa, and tongue normal  NECK: supple, thyroid normal size, non-tender, without nodularity LYMPH:  no palpable lymphadenopathy in the cervical, axillary or inguinal LUNGS: clear to auscultation and percussion with normal breathing effort HEART: regular rate & rhythm and no murmurs and no lower extremity edema ABDOMEN:abdomen soft, non-tender and normal bowel sounds Musculoskeletal:no cyanosis of digits and no clubbing  NEURO: alert & oriented x 3 with fluent speech, no focal motor/sensory deficits  LABORATORY DATA:  I have  reviewed the data as listed    Component Value Date/Time   NA 136 06/24/2016 1014   K 3.9 06/24/2016 1014   CL 105 10/06/2012 1047   CO2 24 06/24/2016 1014   GLUCOSE 104 06/24/2016 1014   GLUCOSE 80 10/06/2012 1047   BUN 17.8 06/24/2016 1014   CREATININE 0.9 06/24/2016 1014   CALCIUM 9.2  06/24/2016 1014   PROT 6.5 06/24/2016 1014   ALBUMIN 3.7 06/24/2016 1014   AST 16 06/24/2016 1014   ALT 17 06/24/2016 1014   ALKPHOS 55 06/24/2016 1014   BILITOT 0.51 06/24/2016 1014   GFRNONAA 88 (L) 05/04/2012 0616   GFRAA >90 05/04/2012 0616    No results found for: SPEP, UPEP  Lab Results  Component Value Date   WBC 18.1 (H) 06/24/2016   NEUTROABS 5.1 06/24/2016   HGB 14.7 06/24/2016   HCT 43.8 06/24/2016   MCV 91.0 06/24/2016   PLT 157 06/24/2016      Chemistry      Component Value Date/Time   NA 136 06/24/2016 1014   K 3.9 06/24/2016 1014   CL 105 10/06/2012 1047   CO2 24 06/24/2016 1014   BUN 17.8 06/24/2016 1014   CREATININE 0.9 06/24/2016 1014      Component Value Date/Time   CALCIUM 9.2 06/24/2016 1014   ALKPHOS 55 06/24/2016 1014   AST 16 06/24/2016 1014   ALT 17 06/24/2016 1014   BILITOT 0.51 06/24/2016 1014       ASSESSMENT & PLAN:  CLL (chronic lymphocytic leukemia) He remained asymptomatic. He had intermittent mild thrombocytopenia, resolved I continue to recommend observation with history, physical examination and blood work on a yearly basis The patient is aware signs and symptoms to watch for disease progression    Prostate cancer [facility]) The patient has diagnosis of prostate cancer, asymptomatic He is under close follow-up with urologist He had recent MR of the pelvis which show pelvic lymphadenopathy. I suspect the lymphadenopathy is related to CLL and not necessarily related to prostate cancer I will defer to his urologist for follow-up and management  Lymphadenopathy, abdominal He has abdominopelvic lymphadenopathy likely due to CLL and not from prostate cancer. I educated the patient regarding this.   Orders Placed This Encounter  Procedures  . CBC with Differential/Platelet    Standing Status:   Future    Standing Expiration Date:   07/29/2017   All questions were answered. The patient knows to call the clinic with any  problems, questions or concerns. No barriers to learning was detected. I spent 15 minutes counseling the patient face to face. The total time spent in the appointment was 20 minutes and more than 50% was on counseling and review of test results     Heath Lark, MD 06/24/2016 3:31 PM

## 2016-06-24 NOTE — Telephone Encounter (Signed)
Patient was given a copy of the AVS report and appointment schedule, per 06/24/16 los. Appointments scheduled per 06/24/16 los.

## 2016-06-24 NOTE — Assessment & Plan Note (Signed)
The patient has diagnosis of prostate cancer, asymptomatic He is under close follow-up with urologist He had recent MR of the pelvis which show pelvic lymphadenopathy. I suspect the lymphadenopathy is related to CLL and not necessarily related to prostate cancer I will defer to his urologist for follow-up and management

## 2016-06-24 NOTE — Assessment & Plan Note (Signed)
He remained asymptomatic. He had intermittent mild thrombocytopenia, resolved I continue to recommend observation with history, physical examination and blood work on a yearly basis The patient is aware signs and symptoms to watch for disease progression

## 2016-06-24 NOTE — Assessment & Plan Note (Signed)
He has abdominopelvic lymphadenopathy likely due to CLL and not from prostate cancer. I educated the patient regarding this.

## 2016-07-11 LAB — FISH,CLL PROGNOSTIC PANEL

## 2016-08-18 ENCOUNTER — Telehealth: Payer: Self-pay | Admitting: Hematology and Oncology

## 2016-08-18 NOTE — Telephone Encounter (Signed)
Appointment in 1 year Brian Bean, please send scheduling msg to fix that appt, see him in 2019

## 2016-08-18 NOTE — Telephone Encounter (Signed)
Patient called and said that he got a robo call for an appointment on 3/14 and wanted to make sure that was correct because he was under the impression that he was to come 08/20/2017  Please confirm and notify patient

## 2016-08-19 ENCOUNTER — Telehealth: Payer: Self-pay | Admitting: Hematology and Oncology

## 2016-08-19 NOTE — Telephone Encounter (Signed)
Called patient to inform him that his appointment is scheduled for 3.14.19 instead of 3.14.18. Time/ Date per 06/24/16 los.

## 2016-08-20 ENCOUNTER — Ambulatory Visit: Payer: Medicare Other | Admitting: Hematology and Oncology

## 2016-08-20 ENCOUNTER — Other Ambulatory Visit: Payer: Medicare Other

## 2017-05-27 ENCOUNTER — Other Ambulatory Visit: Payer: Self-pay | Admitting: Urology

## 2017-05-27 DIAGNOSIS — E230 Hypopituitarism: Secondary | ICD-10-CM

## 2017-06-03 ENCOUNTER — Ambulatory Visit (HOSPITAL_COMMUNITY)
Admission: RE | Admit: 2017-06-03 | Discharge: 2017-06-03 | Disposition: A | Discharge status: Home / Self Care | Payer: Medicare Other | Source: Ambulatory Visit | Attending: Urology | Admitting: Urology

## 2017-06-03 DIAGNOSIS — R220 Localized swelling, mass and lump, head: Secondary | ICD-10-CM | POA: Diagnosis not present

## 2017-06-03 DIAGNOSIS — I6782 Cerebral ischemia: Secondary | ICD-10-CM | POA: Insufficient documentation

## 2017-06-03 DIAGNOSIS — G319 Degenerative disease of nervous system, unspecified: Secondary | ICD-10-CM | POA: Diagnosis not present

## 2017-06-03 DIAGNOSIS — E230 Hypopituitarism: Secondary | ICD-10-CM | POA: Diagnosis not present

## 2017-06-03 LAB — POCT I-STAT CREATININE: Creatinine, Ser: 1.2 mg/dL (ref 0.61–1.24)

## 2017-06-03 MED ORDER — GADOBENATE DIMEGLUMINE 529 MG/ML IV SOLN
10.0000 mL | Freq: Once | INTRAVENOUS | Status: AC | PRN
  Administered 2017-06-03: 10 mL via INTRAVENOUS

## 2017-06-23 ENCOUNTER — Encounter: Payer: Self-pay | Admitting: Medical Oncology

## 2017-06-29 ENCOUNTER — Telehealth: Payer: Self-pay | Admitting: Medical Oncology

## 2017-06-29 ENCOUNTER — Encounter: Payer: Self-pay | Admitting: Genetics

## 2017-06-29 NOTE — Telephone Encounter (Signed)
Left a message requesting a return call to confirm appointment for Prostate [facility] 06/30/17.

## 2017-06-29 NOTE — Progress Notes (Signed)
I called pt to introduce myself as the Prostate Nurse Navigator and the Coordinator of the Prostate [place].  1. I confirmed with the patient he is aware of his referral to the clinic 06/30/17 arriving at 12:30pm/  2. I discussed the format of the clinic and the physicians he will be seeing that day.  3. I discussed where the clinic is located and how to contact me.  4. I confirmed his address and informed him I would be mailing a packet of information and forms to be completed. I asked him to bring them with him the day of his appointment.   He voiced understanding of the above. I asked him to call me if he has any questions or concerns regarding his appointments or the forms he needs to complete.

## 2017-06-29 NOTE — Telephone Encounter (Signed)
Brian Bean called to confirm appointment for Prostate [facility] 06/30/17 arriving at 12:30pm. He states that he has not received the packet of information I mailed last week and today is a holiday. I told him that I will have a set of forms for him to complete tomorrow. We discussed the clinic in details and all questions were answered.

## 2017-06-29 NOTE — Progress Notes (Signed)
GU Location of Tumor / Histology: prostatic adenocarcinoma  If Prostate Cancer, Gleason Score is (3 + 3) and PSA is (16.9). Prostate volume: 60 grams.  Adan Baehr was diagnosed with prostate cancer in 12/02/2013. He has had 3 biopsies since. Most recent biopsy was 05/26/2017  Biopsies of prostate (if applicable) revealed:    Past/Anticipated interventions by urology, if any: biopsies x 4, active surveillance  Past/Anticipated interventions by medical oncology, if any: no  Weight changes, if any: no  Bowel/Bladder complaints, if any: ED, urinary frequency, nocturia    Nausea/Vomiting, if any: no  Pain issues, if any:  no  SAFETY ISSUES:  Prior radiation? no  Pacemaker/ICD? no  Possible current pregnancy? no  Is the patient on methotrexate? no  Current Complaints / other details:  77 year old male. Married.

## 2017-06-30 ENCOUNTER — Inpatient Hospital Stay: Payer: Medicare Other | Attending: Oncology | Admitting: Oncology

## 2017-06-30 ENCOUNTER — Ambulatory Visit
Admission: RE | Admit: 2017-06-30 | Discharge: 2017-06-30 | Disposition: A | Discharge status: Home / Self Care | Payer: Medicare Other | Source: Ambulatory Visit | Attending: Radiation Oncology | Admitting: Radiation Oncology

## 2017-06-30 ENCOUNTER — Encounter: Payer: Self-pay | Admitting: Radiation Oncology

## 2017-06-30 ENCOUNTER — Other Ambulatory Visit: Payer: Self-pay

## 2017-06-30 ENCOUNTER — Encounter: Payer: Self-pay | Admitting: Medical Oncology

## 2017-06-30 VITALS — BP 143/85 | HR 71 | Temp 98.1°F | Resp 18 | Wt 224.8 lb

## 2017-06-30 DIAGNOSIS — I10 Essential (primary) hypertension: Secondary | ICD-10-CM | POA: Diagnosis not present

## 2017-06-30 DIAGNOSIS — F419 Anxiety disorder, unspecified: Secondary | ICD-10-CM | POA: Diagnosis not present

## 2017-06-30 DIAGNOSIS — Z96641 Presence of right artificial hip joint: Secondary | ICD-10-CM | POA: Insufficient documentation

## 2017-06-30 DIAGNOSIS — M1990 Unspecified osteoarthritis, unspecified site: Secondary | ICD-10-CM | POA: Diagnosis not present

## 2017-06-30 DIAGNOSIS — Z87891 Personal history of nicotine dependence: Secondary | ICD-10-CM | POA: Diagnosis not present

## 2017-06-30 DIAGNOSIS — R9721 Rising PSA following treatment for malignant neoplasm of prostate: Secondary | ICD-10-CM | POA: Diagnosis not present

## 2017-06-30 DIAGNOSIS — C61 Malignant neoplasm of prostate: Secondary | ICD-10-CM | POA: Diagnosis not present

## 2017-06-30 DIAGNOSIS — C9110 Chronic lymphocytic leukemia of B-cell type not having achieved remission: Secondary | ICD-10-CM | POA: Diagnosis not present

## 2017-06-30 DIAGNOSIS — Z856 Personal history of leukemia: Secondary | ICD-10-CM | POA: Insufficient documentation

## 2017-06-30 DIAGNOSIS — E230 Hypopituitarism: Secondary | ICD-10-CM | POA: Diagnosis not present

## 2017-06-30 DIAGNOSIS — Z7982 Long term (current) use of aspirin: Secondary | ICD-10-CM | POA: Insufficient documentation

## 2017-06-30 DIAGNOSIS — Z79899 Other long term (current) drug therapy: Secondary | ICD-10-CM

## 2017-06-30 HISTORY — DX: Malignant neoplasm of prostate: C61

## 2017-06-30 NOTE — Progress Notes (Signed)
Radiation Oncology         [phone] ________________________________  Multidisciplinary Prostate Cancer Clinic  Initial Radiation Oncology Consultation  Name: Brian Bean MRN: [id number]  Date: 06/30/2017  DOB: 10-26-1940  ME:[id number], Christiane Ha, MD  Raynelle Bring, MD   REFERRING PHYSICIAN: Raynelle Bring, MD  DIAGNOSIS: 77 y.o. gentleman with stage T1c adenocarcinoma of the prostate with a Gleason's score of 3+3 and a PSA of 16.9    ICD-10-CM   1. Prostate cancer ([place]) C61     HISTORY OF PRESENT ILLNESS::Brian Bean is a 77 y.o. gentleman.  He was noted to have an elevated PSA of 5.96 by his primary care physician in June 2015 and was referred to Dr. Janice Norrie in urology for further evaluation.  He underwent transrectal ultrasound biopsy on 01/26/14 which revealed 2 positive core biopsies with a Gleason score of 3+3 in right lateral mid and left lateral mid. He elected to proceed with active surveillance and his PSA has continued to rise. His PSA was 10.75 in 08/2015, subsequent biopsy on 11/01/15 revealed Gleason 3+3 in 4 of 12 positive biopsies in the right mid, right apex, right base lateral, and right apex lateral. Repeat biopsy on 05/22/16 revealed Gleason 3+3 in 2 of 12 positive biopsies in the right base lateral and right apex lateral.   MRI of the prostate on 05/16/16 showed no restricted diffusion to suggest high-grade carcinoma and no findings to suggest locally advanced disease. There was bilateral pelvic sidewall adenopathy, felt secondary to his h/o CLL. The patient remained under active surveillance. PSA in January 2018 remained stable at 10.7 but increased to 13.3 in Sept. 2018.  This increase was noted after starting Clomid for treatment of hypogonadism.  PSA increased to 16.9 on 04/07/17.  His most recent PSA was 16.5 on 05/20/17 despite stopping Clomid.   At the time of follow up with Dr. Louis Meckel  on 05/26/17,  digital rectal examination was performed and revealed no  nodularity.  The patient proceeded to transrectal ultrasound with 12 biopsies of the prostate on 05/26/17.  The prostate volume measured 60 cc.  Out of 12 core biopsies,4 were positive.  The maximum Gleason score was 3+3, and this was seen in left mid lateral, right apex, right base lateral, and right mid lateral.  The patient reviewed the biopsy results with his urologist and he has kindly been referred today to the multidisciplinary prostate cancer clinic for presentation of pathology and radiology studies in our conference for discussion of potential radiation treatment options and clinical evaluation.   Of note, the patient has a history of CLL and low testosterone with persistant elevation of FSH and LH. His CLL was diagnosed in 2014 and is followed by Dr. Alvy Bimler, felt to be in remission though there was some LAN noted on MRI in 2017.  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  He was evaluated in ENT and advised that this was felt most likely benign and they would follow this in 3 months.  PREVIOUS RADIATION THERAPY: No  PAST MEDICAL HISTORY:  has a past medical history of Anxiety, Arthritis, Cancer ([place]), CLL (chronic lymphocytic leukemia) ([place]) (01/06/2013), CLL (chronic lymphocytic leukemia) ([place]), Hypertension, Lymphocytosis, PONV (postoperative nausea and vomiting), and Prostate cancer ([place]).    PAST SURGICAL HISTORY: Past Surgical History:  Procedure Laterality  Date  . APPENDECTOMY    . CERVICAL DISC SURGERY  00  . COLONOSCOPY  2011   [facility].   . FRACTURE SURGERY     rt ankle  . HERNIA REPAIR     rt and  undistended testicle  . PROSTATE BIOPSY    . rt hip  12   pinned  . TOTAL HIP ARTHROPLASTY  05/03/2012   Procedure: TOTAL HIP ARTHROPLASTY;  Surgeon:  Kerin Salen, MD;  Location: [facility];  Service: Orthopedics;  Laterality: Right;    FAMILY HISTORY: family history includes Cancer in his father; Heart disease in his mother.  SOCIAL HISTORY:  reports that he quit smoking about 40 years ago. His smoking use included cigarettes. He has a 5.00 pack-year smoking history. he has never used smokeless tobacco. He reports that he drinks about 2.4 oz of alcohol per week. He reports that he does not use drugs.  ALLERGIES: Patient has no known allergies.  MEDICATIONS:  Current Outpatient Medications  Medication Sig Dispense Refill  . amLODipine (NORVASC) 5 MG tablet Take 5 mg by mouth 2 (two) times daily.     Marland Kitchen aspirin 81 MG tablet Take 81 mg by mouth daily.    . carvedilol (COREG) 25 MG tablet Take 25 mg by mouth 2 (two) times daily with a meal.    . Cholecalciferol (VITAMIN D3) 1000 units CAPS Take 1,000 Units by mouth.    Marland Kitchen FLUoxetine (PROZAC) 40 MG capsule Take 40 mg by mouth daily as needed.     . hydrochlorothiazide (HYDRODIURIL) 25 MG tablet Take 25 mg by mouth.    Marland Kitchen lisinopril (PRINIVIL,ZESTRIL) 10 MG tablet Take 10 mg by mouth daily.    . Multiple Vitamins-Minerals (CENTRUM SILVER PO) Take by mouth.    . pravastatin (PRAVACHOL) 10 MG tablet Take 10 mg by mouth daily.    . sildenafil (VIAGRA) 100 MG tablet Take 100 mg by mouth daily as needed.    . tadalafil (CIALIS) 5 MG tablet Take 5 mg by mouth daily as needed for erectile dysfunction.     No current facility-administered medications for this encounter.     REVIEW OF SYSTEMS:  On review of systems, the patient reports that he is doing well overall. He reports loss of sleep, fatigue, dental problems, blood in urine, skin cancer, and anxiety. He denies any chest pain, shortness of breath, cough, fevers, chills, night sweats, unintended weight changes. He denies any bowel disturbances, and denies abdominal pain, nausea or vomiting. He denies any new musculoskeletal or joint aches or pains.  His IPSS was 13, indicating moderate urinary symptoms. He is able to complete sexual activity with most attempts. A complete review of systems is obtained and is otherwise negative.   PHYSICAL EXAM:  Wt Readings from Last 3 Encounters:  06/30/17 224 lb 12.8 oz (102 kg)  06/24/16 234 lb 4.8 oz (106.3 kg)  06/25/15 233 lb 14.4 oz (106.1 kg)   Temp Readings from Last 3 Encounters:  06/30/17 98.1 F (36.7 C) (Oral)  06/24/16 98.7 F (37.1 C) (Oral)  06/25/15 98.6 F (37 C) (Oral)   BP Readings from Last 3 Encounters:  06/30/17 (!) 143/85  06/24/16 135/76  06/25/15 134/73   Pulse Readings from Last 3 Encounters:  06/30/17 71  06/24/16 66  06/25/15 66   Pain Assessment Pain Score: 0-No pain/10  In general this is a well appearing Caucasian male in no acute distress. He is alert and oriented x4 and appropriate throughout the  examination. HEENT reveals that the patient is normocephalic, atraumatic. EOMs are intact. PERRLA. Skin is intact without any evidence of gross lesions. Cardiovascular exam reveals a regular rate and rhythm, no clicks rubs or murmurs are auscultated. Chest is clear to auscultation bilaterally. Lymphatic assessment is performed and does not reveal any adenopathy in the cervical, supraclavicular, axillary, or inguinal chains. Abdomen has active bowel sounds in all quadrants and is intact. The abdomen is soft, non tender, non distended. Lower extremities are negative for deep calf tenderness, cyanosis or clubbing.  He does have +1 pitting edema in the right lower extremity.  KPS = 90  100 - Normal; no complaints; no evidence of disease. 90   - Able to carry on normal activity; minor signs or symptoms of disease. 80   - Normal activity with effort; some signs or symptoms of disease. 15   - Cares for self; unable to carry on normal activity or to do active work. 60   - Requires occasional assistance, but is able to care for most of his personal needs. 50   - Requires  considerable assistance and frequent medical care. 8   - Disabled; requires special care and assistance. 6   - Severely disabled; hospital admission is indicated although death not imminent. 66   - Very sick; hospital admission necessary; active supportive treatment necessary. 10   - Moribund; fatal processes progressing rapidly. 0     - Dead  Karnofsky DA, Abelmann [place], Craver LS and Burchenal [facility] [phone]) The use of the nitrogen mustards in the palliative treatment of carcinoma: with particular reference to bronchogenic carcinoma Cancer 1 [id number]   LABORATORY DATA:  Lab Results  Component Value Date   WBC 18.1 (H) 06/24/2016   HGB 14.7 06/24/2016   HCT 43.8 06/24/2016   MCV 91.0 06/24/2016   PLT 157 06/24/2016   Lab Results  Component Value Date   NA 136 06/24/2016   K 3.9 06/24/2016   CL 105 10/06/2012   CO2 24 06/24/2016   Lab Results  Component Value Date   ALT 17 06/24/2016   AST 16 06/24/2016   ALKPHOS 55 06/24/2016   BILITOT 0.51 06/24/2016     RADIOGRAPHY: Mr Jeri Cos HE Contrast  Result Date: 06/03/2017 CLINICAL DATA:  Hypogonadotropic hypogonadism. EXAM: MRI HEAD WITHOUT AND WITH CONTRAST TECHNIQUE: Multiplanar, multiecho pulse sequences of the brain and surrounding structures were obtained without and with intravenous contrast. CONTRAST:  40mL MULTIHANCE GADOBENATE DIMEGLUMINE 529 MG/ML IV SOLN COMPARISON:  None. FINDINGS: Brain: There is no evidence of acute infarct, intracranial hemorrhage, mass, midline shift, or extra-axial fluid collection. There is mild-to-moderate cerebral atrophy. Scattered foci of T2 hyperintensity in the subcortical and deep cerebral white matter nonspecific but compatible with mild chronic small vessel ischemic disease. No abnormal enhancement is identified. Dedicated pituitary imaging was performed. Pituitary morphology is normal. Pituitary enhancement is somewhat heterogeneous bilaterally on early postcontrast sequences without a convincing  focal lesion identified. Pituitary enhancement is uniform on the delayed images. The infundibulum is midline. The optic chiasm and cavernous sinuses are unremarkable. Vascular: Major intracranial vascular flow voids are preserved. Skull and upper cervical spine: Unremarkable bone marrow signal. Sinuses/Orbits: Bilateral cataract extraction. Trace right mastoid fluid. Clear paranasal sinuses. Other: 1.2 cm well-circumscribed enhancing mass superficially in the left parotid gland. IMPRESSION: 1. No pituitary lesion identified. 2. Mild chronic small vessel ischemic disease and cerebral atrophy. 3. 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 Electronically Signed  By: Logan Bores M.D.   On: 06/03/2017 13:41      IMPRESSION/PLAN: 77 y.o. gentleman with an intermediate risk, stage T1c adenocarcinoma of the prostate with a PSA of 16.9 and a Gleason score of 3+3.  We discussed the patient's workup and outlines the nature of prostate cancer in this setting. The patient's T stage, Gleason's score, and PSA put him into the intermediate risk group. Accordingly, he is eligible for a variety of potential treatment options including active surveillance or 8 weeks of external radiation. We discussed the available radiation techniques, and focused on the details and logistics and delivery. We discussed and outlined the risks, benefits, short and long-term effects associated with radiotherapy and compared and contrasted these with prostatectomy. We discussed the role of SpaceOAR in reducing the rectal toxicity associated with radiotherapy.   At the end of the conversation the patient is interested in moving forward with external beam radiotherapy. We discussed the potential for hypofractionation with a 5 1/2 week course of daily treatments as opposed to 8 weeks.  We would strongly recommend the use of SpaceOAR in the case of hypofractionation to reduce rectal toxicity.  He has not had placement of  gold fiducial markers. We will contact [facility] to make arrangements for fiducial marker placement and SpaceOAR in the outpatient surgery setting prior to simulation. He will be scheduled for simulation in the near future.   We will share our discussion with Dr. Louis Meckel and move forward with scheduling placement of 3 gold fiducial markers into the prostate with SpaceOAR placement in preparation to proceed with IMRT in the near future.    Nicholos Johns, PA-C    Tyler Pita, MD  [facility] Direct Dial: [phone]  Fax: [phone] [email or web address]  Skype  [email or web address]   This document serves as a record of services personally performed by Tyler Pita, MD and Freeman Caldron, PA-C. It was created on their behalf by Bethann Humble, a trained medical scribe. The creation of this record is based on the scribe's personal observations and the provider's statements to them. This document has been checked and approved by the attending provider.

## 2017-06-30 NOTE — Consult Note (Signed)
Brian Bean     06/30/2017   --------------------------------------------------------------------------------   Brian Bean  MRN: [id number]  PRIMARY CARE:  Brian Kil. Jonny Ruiz, MD  DOB: [birth year], 77 year old Male  REFERRING:  Ardis Hughs, MD  SSN: [id number]  PROVIDER:  Louis Meckel, M.D.    TREATING:  Brian Bean, M.D.    LOCATION:  [facility] [phone]   --------------------------------------------------------------------------------   CC/HPI: Prostate cancer    [name] is a 77 year old gentleman with a complex history seen today in consultation at the request of Dr. Burman Nieves. He was originally diagnosed with low risk and low volume, Gleason 3+3 = 6 adenocarcinoma in August 2015. He elected to proceed with active surveillance management. On follow-up in May 2017, his PSA had increased to 10.75. He underwent a repeat surveillance biopsy indicated persistent Gleason 3+3 = 6 adenocarcinoma but now with higher volume disease including 4 out of 12 biopsy cores and 70% involvement of the right apex. As part of his surveillance, he underwent an MRI of the prostate and 2017. This did not demonstrate any concerning lesions in the prostate suggesting high-grade disease but did demonstrate some mild pelvic lymphadenopathy. He did have a history of CLL and it was felt that this could potentially represent CLL although metastatic prostate cancer could not be completely excluded either. He therefore proceeded with another prostate biopsy in December 2017 and again indicated only 2 out of 12 biopsy cores positive for Gleason 3+3 = 6 disease. Unfortunately, his PSA has continued to increase is noted below up to 16 in early December 2018. This ultimately prompted his most recent biopsy last month that again only indicated to cores positive for Gleason 3+3 = 6 disease.   Addition to his prostate cancer, he has a history of testosterone deficiency with  an elevated LH and FSH level. He was placed on clomiphene and had normalization of his testosterone level to over 600. However, his [place] and LH remains significantly elevated despite normalization of his testosterone. He was only on clomiphene for 1 month and has now been off this for multiple months. He does feel significantly improved with a normalized testosterone level and feels that this has added significantly to his quality of life. Due to the persistent elevation of his LH and FSH, he underwent further evaluation. His prolactin level was normal. He did have an MRI of his brain that was also normal and did not demonstrate an adenoma. However, he was incidentally noted to have a parotid gland mass. He was seen by Dr. Janace Hoard in ENT and this was felt to likely be benign and something that could be followed with further investigation or biopsy should it demonstrated enlargement.   He does have mild erectile dysfunction. SHIM score is 17. He has previously used sildenafil for treatment. IPSS is 14. Overall, he is not particularly bothered by his symptoms.     ALLERGIES: Anesthesia Extension Set MISC    MEDICATIONS: Hydrochlorothiazide 25 mg tablet  Levitra 20 mg tablet  Coreg 25 mg tablet  Lo-Dose Aspirin Ec 81 mg tablet, delayed release  Melatonin  Norvasc 5 mg tablet  Norvasc 10 mg tablet  Pravastatin Sodium 10 mg tablet  Prinivil 10 mg tablet  Prozac 40 mg capsule  Sildenafil 20 mg tablet 2-5 tablets as needed  Vitamin D3 1 50,000 PO Q1WK     GU PSH: Prostate Needle Biopsy - 05/26/2017, 05/22/2016      PSH Notes: Skin Debridement, Total  Hip Replacement, Hip Surgery Right   NON-GU PSH: Surgical Pathology, Gross And Microscopic Examination For Prostate Needle - 05/26/2017, 05/22/2016 Total Hip Replacement - 2014    GU PMH: Prostate Cancer - 06/23/2017, - 04/14/2017, - 01/08/2017, - 11/15/2015, Adenocarcinoma of prostate, - 08/07/2015 Elevated PSA, Elevated prostate specific antigen (PSA) -  2015 BPH w/o LUTS, Benign prostatic hypertrophy without lower urinary tract symptoms - 2015 BPH w/LUTS, Benign localized prostatic hyperplasia with lower urinary tract symptoms (LUTS) - 2014      PMH Notes: Diagnosed with low grade/low volume prostate cancer in 2015. He was placed on active surveillance and lost to follow-up.   Prostate Cancer:  01/2014:  PSA - 5.96  Path: Gleason 3+3 = 6 in 2/12 cores, 20% of right-mid lateral, 5% of left-mid lateral  Prostate vol. 43gm   10/2015:  PSA - 10.75  Path: 3+3=6 in 4/12 cores (70% in right-mid apex)  Prostate vol. 54gm  IPSS: The patient has minimal voiding symptoms  SHIM: The patient is able to obtain an erection without the assistance of Viagra   PSA:  5.42 09/09/12  6.20 03/23/13  5.69 09/16/13  9.37 05/26/15  10.75 08/07/15   CLL - diagnosed in 2014, LAD seen on his MRI obtained in 05/2016   8/18: The patient presented with symptoms of chronic fatigue. This is despite eating right, getting plenty of exercise. He is also gaining weight. Frustrated and anxious to start Testosterone supplementation. Long time (two separate appts) discussion of risk with prostate cancer and testosterone supplementation including advanced disease/metastatic spread which he understood. He was started on Clomid 50mg  daily.   NON-GU PMH: Low testosterone - 01/08/2017 Encounter for general adult medical examination without abnormal findings, Encounter for preventive health examination - 08/07/2015, Annual physical exam, - 07/12/2015 Anxiety, Anxiety (Symptom) - 2014 Depression, Depression - 2014 Personal history of other diseases of the circulatory system, History of hypertension - 2014 Personal history of other mental and behavioral disorders, History of depression - 2014    FAMILY HISTORY: Cancer - Runs In Family Death In The Family Father - Runs In Family Death In The Family Mother - Runs In Family Family Health Status Number - Runs In Family Hypertension -  Runs In Family   SOCIAL HISTORY: Marital Status: Single Preferred Language: English; Ethnicity: Not Hispanic Or Latino; Race: White     Notes: Former smoker, Marital History - Currently Married, Caffeine Use, Alcohol Use   REVIEW OF SYSTEMS:    GU Review Male:   Patient denies frequent urination, hard to postpone urination, burning/ pain with urination, get up at night to urinate, leakage of urine, stream starts and stops, trouble starting your streams, and have to strain to urinate .  Gastrointestinal (Upper):   Patient denies nausea and vomiting.  Gastrointestinal (Lower):   Patient denies diarrhea and constipation.  Constitutional:   Patient denies fever, night sweats, weight loss, and fatigue.  Skin:   Patient denies skin rash/ lesion and itching.  Eyes:   Patient denies blurred vision and double vision.  Ears/ Nose/ Throat:   Patient denies sore throat and sinus problems.  Hematologic/Lymphatic:   Patient denies swollen glands and easy bruising.  Cardiovascular:   Patient denies leg swelling and chest pains.  Respiratory:   Patient denies cough and shortness of breath.  Endocrine:   Patient denies excessive thirst.  Musculoskeletal:   Patient denies back pain and joint pain.  Neurological:   Patient denies headaches and dizziness.  Psychologic:   Patient  denies depression and anxiety.   VITAL SIGNS: None   MULTI-SYSTEM PHYSICAL EXAMINATION:    Constitutional: Well-nourished. No physical deformities. Normally developed. Good grooming.     PAST DATA REVIEWED:  Source Of History:  Patient  Lab Test Review:   PSA  Records Review:   Pathology Reports, Previous Patient Records  X-Ray Review: MRI Prostate GSORAD: Reviewed Films.     06/19/17 05/20/17 04/07/17 02/20/17 11/28/16 06/20/16 08/08/15 09/17/13  PSA  Total PSA 18.4 ng/dl 16.5 ng/dl 16.9 ng/dl 13.30 ng/mL 9.99 ng/mL 10.70 ng/dl 10.75  5.69   Free PSA        0.62   % Free PSA        11     05/20/17 02/20/17 01/05/17  12/05/16  Hormones  Testosterone, Total 603 pg/dL 500.0 ng/dL 226.8 ng/dL 174.1 ng/dL    PROCEDURES: None   ASSESSMENT:      ICD-10 Details  1 GU:   Prostate Cancer - C61   2 NON-GU:   Low testosterone - E34.9    PLAN:           Document Letter(s):  Created for Patient: Clinical Summary         Notes:   1. Prostate cancer: Negative discussion with Mr. Bram that he technically has intermediate risk prostate cancer based on his PSA levels. We also expressed concern about the concerning rising trend of his PSA since his diagnosis in 2015. However, his pathology from multiple biopsies between 2015 and 2018 and not indicated anything but low-grade disease. Considering this discordance, we did discuss the possibilities. He understands that he could potentially harbor more aggressive disease that has not been identified on his prior biopsies and may be progressive. We also discussed the possibility that he truly has low-grade disease in that his PSA elevation may be falsely positive and not necessarily driven by his malignancy. We discussed that there are known reports of salivary gland malignancies that may secrete PSA although this would be very rare. Dr. Janace Hoard has evaluated Mr. Cho and feels that his parotid tumor is most likely benign based on imaging.   Based on this discussion, we discussed 2 options. We reviewed the option of continuing with surveillance albeit with increased risk and the possibility of missing a window of opportunity for curative treatment. He did have a detailed discussion with Dr. Alen Blew today regarding systemic therapies for prostate cancer should this occur. We also reviewed the option of proceeding with definitive therapy and reviewed available treatment options. Although we discussed surgery as an option, I did not strongly recommend this considering his advanced age and higher risk for incontinence. He also had a detailed discussion with Dr. Tammi Klippel today regarding  the option of proceeding with radiation therapy. Considering the significant improvement in his quality of life with his increased testosterone level, he is understandably hesitant to proceed with androgen deprivation even for a short course. Dr. Tammi Klippel does believe he would be amenable to proceeding with IMRT without androgen deprivation as an appropriate treatment option. He is going to consider his options and will notify Dr. Louis Meckel or Dr. Tammi Klippel in the near future of his final decision.   2. Testosterone deficiency: His most recent testosterone level all therapy was 603 and remains therapeutic off treatment. His quality of life is significantly improved. However, his LH and FSH have remained significantly elevated without a clear reason. We did discuss the recommendation to see endocrinology Center further specialist evaluation to rule out any other concerns  regarding these discordant findings.   All questions were answered to his stated satisfaction. He will plan to follow up with Dr. Louis Meckel and likely Dr. Tammi Klippel pending his decision.   Cc: Dr. Louis Meckel  Dr. Lajean Manes  Dr. Melissa Montane  Dr. Tyler Pita  Dr. Zola Button

## 2017-06-30 NOTE — Progress Notes (Signed)
Reason for Referral: Prostate cancer.  HPI: 78 year old gentleman with history of CLL has been on active surveillance since 2014 and currently under the care of Dr. Alvy Bimler.  Was also diagnosed with testosterone deficiency and has been under the care of Dr. Louis Meckel regarding this issue.  His testosterone was low at 174 in July 2018 and was started on clomiphene with improvement in his testosterone up to 603 after 2 months of this therapy.  He was found to have prostate cancer in June 2015.  At that time his PSA was 5.96 with a Gleason score of 6.  He had multiple biopsies at that time continue to be on active surveillance.  His most recent biopsy in December 2018 continue to show Gleason score 6 with small volume disease.  His PSA continues to rise and his PSA was up to 16.5 in December 2018.  Prior to that it was 16.9 in October, 9.9 in June 2018.  He reports no complaints related to his prostate cancer.  He denies any frequency, urgency or nocturia.  He reported symptoms of excessive fatigue and tiredness associated with low testosterone.  He denies any headaches or neurological deficits.  He did have an MRI to evaluate for a pituitary tumor without any malignancy noted.  He was found to have a parotid enlargement and has been evaluated by ENT.   He does not report any headaches, blurry vision, syncope or seizures. Does not report any fevers, chills or sweats.  Does not report any cough, wheezing or hemoptysis.  Does not report any chest pain, palpitation, orthopnea or leg edema.  Does not report any nausea, vomiting or abdominal pain.  She does not report any constipation or diarrhea.  Does not report any skeletal complaints.  Does not report any lymphadenopathy or petechiae.  Does not report any anxiety or depression.  Remaining review of systems is negative.     Past Medical History:  Diagnosis Date  . Anxiety   . Arthritis   . Cancer ([place])    skin lesion scalp  . CLL (chronic lymphocytic  leukemia) ([place]) 01/06/2013  . CLL (chronic lymphocytic leukemia) ([place])   . Hypertension   . Lymphocytosis   . PONV (postoperative nausea and vomiting)    extremely sick  . Prostate cancer [facility])   :  Past Surgical History:  Procedure Laterality Date  . APPENDECTOMY    . CERVICAL DISC SURGERY  00  . COLONOSCOPY  2011   [facility].   . FRACTURE SURGERY     rt ankle  . HERNIA REPAIR     rt and  undistended testicle  . PROSTATE BIOPSY    . rt hip  12   pinned  . TOTAL HIP ARTHROPLASTY  05/03/2012   Procedure: TOTAL HIP ARTHROPLASTY;  Surgeon: Kerin Salen, MD;  Location: [facility];  Service: Orthopedics;  Laterality: Right;  :   Current Outpatient Medications:  .  amLODipine (NORVASC) 5 MG tablet, Take 5 mg by mouth 2 (two) times daily. , Disp: , Rfl:  .  aspirin 81 MG tablet, Take 81 mg by mouth daily., Disp: , Rfl:  .  carvedilol (COREG) 25 MG tablet, Take 25 mg by mouth 2 (two) times daily with a meal., Disp: , Rfl:  .  Cholecalciferol (VITAMIN D3) 1000 units CAPS, Take 1,000 Units by mouth., Disp: , Rfl:  .  FLUoxetine (PROZAC) 40 MG capsule, Take 40 mg by mouth daily as needed. , Disp: , Rfl:  .  hydrochlorothiazide (  HYDRODIURIL) 25 MG tablet, Take 25 mg by mouth., Disp: , Rfl:  .  lisinopril (PRINIVIL,ZESTRIL) 10 MG tablet, Take 10 mg by mouth daily., Disp: , Rfl:  .  Multiple Vitamins-Minerals (CENTRUM SILVER PO), Take by mouth., Disp: , Rfl:  .  pravastatin (PRAVACHOL) 10 MG tablet, Take 10 mg by mouth daily., Disp: , Rfl:  .  sildenafil (VIAGRA) 100 MG tablet, Take 100 mg by mouth daily as needed., Disp: , Rfl:  .  tadalafil (CIALIS) 5 MG tablet, Take 5 mg by mouth daily as needed for erectile dysfunction., Disp: , Rfl: :  No Known Allergies:  Family History  Problem Relation Age of Onset  . Heart disease Mother   . Cancer Father        unk type of cancer  :  Social History   Socioeconomic History  . Marital status: Legally Separated    Spouse name: Not on file  .  Number of children: 1  . Years of education: Not on file  . Highest education level: Not on file  Social Needs  . Financial resource strain: Not on file  . Food insecurity - worry: Not on file  . Food insecurity - inability: Not on file  . Transportation needs - medical: Not on file  . 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 Smoking status: Former Smoker    Packs/day: 0.50    Years: 10.00    Pack years: 5.00    Types: Cigarettes    Last attempt to quit: 04/28/1977    Years since quitting: 40.2  . Smokeless tobacco: Never Used  . Tobacco comment: 10 oz alcohol wkly  Substance and Sexual Activity  . Alcohol use: Yes    Alcohol/week: 2.4 oz    Types: 4 Glasses of wine per week  . Drug use: No  . Sexual activity: Yes  Other Topics Concern  . Not on file  Social History Narrative   Retired.   Significant other: Nicanor Bake   One son, Saralyn Pilar  :  Pertinent items are noted in HPI.  Exam: ECOG 0 General appearance: alert and cooperative appeared without distress. Eyes: conjunctivae/corneas clear.  Throat: lips, mucosa, and tongue normal; teeth and gums normal Resp: clear to auscultation bilaterally without rhonchi or wheezes. Cardio: regular rate and rhythm, S1, S2 normal, no murmur, click, rub or gallop GI: soft, non-tender; bowel sounds normal; no masses,  no organomegaly Musculoskeletal: No joint tenderness or effusion. Skin: Skin color, texture, turgor normal. No rashes or lesions Lymph nodes: Cervical, supraclavicular, and axillary nodes normal.  CBC    Component Value Date/Time   WBC 18.1 (H) 06/24/2016 1013   WBC 14.2 (H) 05/05/2012 0638   RBC 4.81 06/24/2016 1013   RBC 3.23 (L) 05/05/2012 0638   HGB 14.7 06/24/2016 1013   HCT 43.8 06/24/2016 1013   PLT 157 06/24/2016 1013   MCV 91.0 06/24/2016 1013   MCH 30.5 06/24/2016 1013   MCH 30.3 05/05/2012 0638    MCHC 33.5 06/24/2016 1013   MCHC 33.3 05/05/2012 0638   RDW 14.1 06/24/2016 1013   LYMPHSABS 12.0 (H) 06/24/2016 1013   MONOABS 0.7 06/24/2016 1013   EOSABS 0.1 06/24/2016 1013   BASOSABS 0.1 06/24/2016 1013     Chemistry      Component Value Date/Time   NA 136 06/24/2016 1014   K 3.9 06/24/2016 1014   CL 105  10/06/2012 1047   CO2 24 06/24/2016 1014   BUN 17.8 06/24/2016 1014   CREATININE 1.20 06/03/2017 1223   CREATININE 0.9 06/24/2016 1014      Component Value Date/Time   CALCIUM 9.2 06/24/2016 1014   ALKPHOS 55 06/24/2016 1014   AST 16 06/24/2016 1014   ALT 17 06/24/2016 1014   BILITOT 0.51 06/24/2016 1014       Mr Brain W Wo Contrast  Result Date: 06/03/2017 CLINICAL DATA:  Hypogonadotropic hypogonadism. EXAM: MRI HEAD WITHOUT AND WITH CONTRAST TECHNIQUE: Multiplanar, multiecho pulse sequences of the brain and surrounding structures were obtained without and with intravenous contrast. CONTRAST:  49mL MULTIHANCE GADOBENATE DIMEGLUMINE 529 MG/ML IV SOLN COMPARISON:  None. FINDINGS: Brain: There is no evidence of acute infarct, intracranial hemorrhage, mass, midline shift, or extra-axial fluid collection. There is mild-to-moderate cerebral atrophy. Scattered foci of T2 hyperintensity in the subcortical and deep cerebral white matter nonspecific but compatible with mild chronic small vessel ischemic disease. No abnormal enhancement is identified. Dedicated pituitary imaging was performed. Pituitary morphology is normal. Pituitary enhancement is somewhat heterogeneous bilaterally on early postcontrast sequences without a convincing focal lesion identified. Pituitary enhancement is uniform on the delayed images. The infundibulum is midline. The optic chiasm and cavernous sinuses are unremarkable. Vascular: Major intracranial vascular flow voids are preserved. Skull and upper cervical spine: Unremarkable bone marrow signal. Sinuses/Orbits: Bilateral cataract extraction. Trace right  mastoid fluid. Clear paranasal sinuses. Other: 1.2 cm well-circumscribed enhancing mass superficially in the left parotid gland. IMPRESSION: 1. No pituitary lesion identified. 2. Mild chronic small vessel ischemic disease and cerebral atrophy. 3. 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 Electronically Signed   By: Logan Bores M.D.   On: 06/03/2017 13:41    Assessment and Plan:   77 year old gentleman with the following issues:  1.  Prostate cancer diagnosed in 2015.  His PSA was 5.96 with a Gleason score of 6 at the time.  He has been on active surveillance since that time and his most recent biopsy in December 2018 continue to show low volume disease.  His PSA has increased to 16.5 in December 2018.  His case was discussed today in the prostate cancer multidisciplinary clinic.  Including review of his imaging studies as well as discussion with pathology regarding his prostate cancer biopsy.  Options of treatment were reviewed today which include continued active surveillance although his PSA being elevated is worrisome for a possible high-grade malignancy that is undetected.  The risk of continuing active surveillance would be missing a window of opportunity to treat curable cancer and at that time he might be at risk of developing advanced disease.  I explained to him the natural course of advanced prostate cancer as well as treatment options at the time.  These options will not be curable but rather palliative.  Definitive treatment would be also reasonable at this time given the rise in the PSA could indicate intermediate risk disease that warrants treatment.  His options were include primary surgical therapy versus radiation therapy.  He will consider these options as well.  We also discussed the role of obtaining additional cores of the prostate in his future biopsy which he will consider that option as well.  The likelihood of a PSA producing tumor from the  parotid gland appears to be less likely at this time.  2.  Low testosterone: I have recommended endocrinology evaluation to rule out a pituitary dysfunction.  He is off testosterone  replacement at this time which is preferable in the setting of his prostate cancer.  3.  CLL: Continue to be on active surveillance at this time without any indication for treatment.  He does have pelvic adenopathy on his MRI which is insistent with CLL.  30  minutes was spent with the patient face-to-face today.  More than 50% of time was dedicated to patient counseling, education and answering his questions.

## 2017-06-30 NOTE — Progress Notes (Signed)
                               Care Plan Summary  Name: Brian Bean DOB: 03/31/1921   Your Medical Team:   Urologist -  Dr. Raynelle Bring, [facility]  Radiation Oncologist - Dr. Tyler Pita, [facility]   Medical Oncologist - Dr. Zola Button, [place]  Recommendations: 1) Active surveillance  2) Radiation   * These recommendations are based on information available as of today's consult.      Recommendations may change depending on the results of further tests or exams.  Next Steps: 1) consider treatment options and call Cira Rue with decision   When appointments need to be scheduled, you will be contacted by [facility] and/or [facility].  Patient was provided with business cards of team members and a copy of "Fall [place] Patient Safety Plan". Questions?  Please do not hesitate to call Cira Rue, RN, BSN, OCN at [phone] any questions or concerns.  Shirlean Mylar is your Oncology Nurse Navigator and is available to assist you while you're receiving your medical care at [facility].

## 2017-07-01 ENCOUNTER — Encounter: Payer: Self-pay | Admitting: General Practice

## 2017-07-01 NOTE — Progress Notes (Signed)
[facility] Psychosocial Distress Screening Spiritual Care  Met with Iona Beard and Titus Mould in [facility] to introduce [facility] team/resources, reviewing distress screen per protocol.  The patient scored a 1 on the Psychosocial Distress Thermometer which indicates mild distress. Also assessed for distress and other psychosocial needs.   ONCBCN DISTRESS SCREENING 07/01/2017  Screening Type Initial Screening  Distress experienced in past week (1-10) 1  Emotional problem type Adjusting to appearance changes  Physical Problem type Sexual problems  Referral to support programs Yes   Mr Hinch appears to use humor to cope and to manage distress, including jokes with derogatory gender-based themes. He reports distress related to finances and short-term memory. His SO Zigmund Daniel is a former Publishing copy at [employer] and a Hydrographic surveyor. They verbalized appreciation for [facility] resources and team availability, also praising the [facility] team and structure.    Follow up needed: No. Per couple, no needs at this time, but they know to contact team with any questions. Please also page if immediate needs arise or circumstances change. Thank you.   [place], [place], [facility] Pager [phone] Voicemail [phone]

## 2017-07-07 ENCOUNTER — Telehealth: Payer: Self-pay | Admitting: Medical Oncology

## 2017-07-07 ENCOUNTER — Telehealth: Payer: Self-pay | Admitting: Urology

## 2017-07-07 NOTE — Telephone Encounter (Signed)
I spoke with Brian Bean to follow up post clinic. He has been doing research on radiation and would like to talk with you. He has lots of questions and concerns.He is undecided about getting further biopsies or to move forward with radiation. I asked if he would like an office visit but felt his concerns could be answered by phone. I forwarded this note to Ashlyn and Dr. Tammi Klippel. I will continue to follow and asked him to call with questions and or concerns. He voiced understanding.

## 2017-07-07 NOTE — Telephone Encounter (Signed)
I spoke on the phone with Mr. Salzwedel at length (20 min. +) answering additional questions that he had regarding short/long term risks of radiation, radiation safety, effectiveness of treatment and recommendations for any additional biopsies or testing.  He was quite appreciative of the information and reported that he feels much more comfortable/confident with proceeding with prostate IMRT but wishes to pursue further evaluation of potential hormonal influences on his PSA before committing.  He is not yet convinced that his elevated PSA is directly related to his prostate cancer and wants to ensure that he is indeed felt to be in the intermittent risk group in need of treatment as opposed to the low-risk group which would qualify for continued active surveillance.  Apparently, Dr. Louis Meckel is arranging a consult with an endocrinologist for further evaluation/recommendations.  He will contact us with any further questions and will call once he has made his final decision for treatment.  I advised that we would stay in contact and follow up periodically as well. Nicholos Johns, PA-C

## 2017-07-07 NOTE — Telephone Encounter (Signed)
Mr. Hoeger called asking about support for his significant other. Her late husband passed from cancer and his diagnosis is causing her anxiety. I will reach out to Lorrin Jackson and have her follow up with her.

## 2017-07-07 NOTE — Telephone Encounter (Signed)
-----   Message from Gwenette Greet, RN sent at 07/07/2017  3:11 PM EST ----- Regarding: Radiation Hi Brian Bean,  I spoke with Brian Bean to follow up post clinic. He has been doing research on radiation and would like to talk with you. He has lots of questions and concerns.He is undecided about getting further biopsies or to move forward with radiation. I asked if he would like an office visit but felt his concerns could be answered by phone.  Thanks,  Shirlean Mylar  He is much calmer than he was at clinic.

## 2017-07-09 ENCOUNTER — Telehealth: Payer: Self-pay | Admitting: *Deleted

## 2017-07-09 NOTE — Telephone Encounter (Signed)
Called patient to inform of gold seed placement and space oar on 07-23-17 @ 9:45 am @ [place] and his sim on 07-31-17 @ 11 am @ Dr. Johny Shears Office, lvm for a  return call

## 2017-07-10 ENCOUNTER — Encounter: Payer: Self-pay | Admitting: General Practice

## 2017-07-10 NOTE — Progress Notes (Signed)
[facility] Spiritual Care Note  Received request for f/u call for spouse via prostate navigator Robin Bass/RN. LVM of care, encouragement, and ongoing availability for support as f/u to meeting them in [facility]. Requested that either or both return my call if it would be helpful to connect in more detail.   [place], [place], [facility] Pager [phone] Voicemail [phone]

## 2017-07-23 ENCOUNTER — Encounter (HOSPITAL_BASED_OUTPATIENT_CLINIC_OR_DEPARTMENT_OTHER): Payer: Self-pay

## 2017-07-23 ENCOUNTER — Ambulatory Visit (HOSPITAL_BASED_OUTPATIENT_CLINIC_OR_DEPARTMENT_OTHER): Admit: 2017-07-23 | Payer: Medicare Other | Admitting: Urology

## 2017-07-23 SURGERY — INSERTION, GOLD SEEDS
Anesthesia: General

## 2017-07-31 ENCOUNTER — Ambulatory Visit: Payer: Medicare Other | Admitting: Radiation Oncology

## 2017-08-07 ENCOUNTER — Telehealth: Payer: Self-pay | Admitting: *Deleted

## 2017-08-07 NOTE — Telephone Encounter (Signed)
RETURNED PATIENT'S PHONE CALL AND EXPLAINED THAT BOTH DRS. WOULD HAVE TO BE PRESENT WHEN HIS SURGERY HAPPENS, [facility] SCHEDULE IS NOT OPEN AT THIS TIME FOR July, UNTIL THE SCHEDULE IS OPEN THE PROCEDURE CANNOT BE SCHEDULED, PATIENT VERIFIED UNDERSTANDING THIS

## 2017-08-07 NOTE — Telephone Encounter (Signed)
XXXXX

## 2017-08-20 ENCOUNTER — Telehealth: Payer: Self-pay | Admitting: Hematology and Oncology

## 2017-08-20 ENCOUNTER — Other Ambulatory Visit: Payer: Self-pay | Admitting: Hematology and Oncology

## 2017-08-20 ENCOUNTER — Inpatient Hospital Stay (HOSPITAL_BASED_OUTPATIENT_CLINIC_OR_DEPARTMENT_OTHER): Payer: Medicare Other | Admitting: Hematology and Oncology

## 2017-08-20 ENCOUNTER — Inpatient Hospital Stay: Payer: Medicare Other | Attending: Oncology

## 2017-08-20 ENCOUNTER — Encounter: Payer: Self-pay | Admitting: Hematology and Oncology

## 2017-08-20 DIAGNOSIS — C61 Malignant neoplasm of prostate: Secondary | ICD-10-CM

## 2017-08-20 DIAGNOSIS — R590 Localized enlarged lymph nodes: Secondary | ICD-10-CM | POA: Diagnosis not present

## 2017-08-20 DIAGNOSIS — Z79899 Other long term (current) drug therapy: Secondary | ICD-10-CM | POA: Diagnosis not present

## 2017-08-20 DIAGNOSIS — Z85828 Personal history of other malignant neoplasm of skin: Secondary | ICD-10-CM | POA: Insufficient documentation

## 2017-08-20 DIAGNOSIS — Z6830 Body mass index (BMI) 30.0-30.9, adult: Secondary | ICD-10-CM | POA: Diagnosis not present

## 2017-08-20 DIAGNOSIS — C9110 Chronic lymphocytic leukemia of B-cell type not having achieved remission: Secondary | ICD-10-CM

## 2017-08-20 DIAGNOSIS — C9190 Lymphoid leukemia, unspecified not having achieved remission: Secondary | ICD-10-CM | POA: Diagnosis not present

## 2017-08-20 DIAGNOSIS — D696 Thrombocytopenia, unspecified: Secondary | ICD-10-CM | POA: Insufficient documentation

## 2017-08-20 DIAGNOSIS — E669 Obesity, unspecified: Secondary | ICD-10-CM

## 2017-08-20 DIAGNOSIS — E663 Overweight: Secondary | ICD-10-CM | POA: Insufficient documentation

## 2017-08-20 DIAGNOSIS — Z7982 Long term (current) use of aspirin: Secondary | ICD-10-CM | POA: Diagnosis not present

## 2017-08-20 LAB — CBC WITH DIFFERENTIAL/PLATELET
Basophils Absolute: 0 10*3/uL (ref 0.0–0.1)
Basophils Relative: 0 %
Eosinophils Absolute: 0.1 10*3/uL (ref 0.0–0.5)
Eosinophils Relative: 1 %
HCT: 45.2 % (ref 38.4–49.9)
Hemoglobin: 15.3 g/dL (ref 13.0–17.1)
Lymphocytes Relative: 71 %
Lymphs Abs: 12.6 10*3/uL — ABNORMAL HIGH (ref 0.9–3.3)
MCH: 31.2 pg (ref 27.2–33.4)
MCHC: 33.8 g/dL (ref 32.0–36.0)
MCV: 92.1 fL (ref 79.3–98.0)
Monocytes Absolute: 0.6 10*3/uL (ref 0.1–0.9)
Monocytes Relative: 3 %
Neutro Abs: 4.4 10*3/uL (ref 1.5–6.5)
Neutrophils Relative %: 25 %
Platelets: 144 10*3/uL (ref 140–400)
RBC: 4.91 MIL/uL (ref 4.20–5.82)
RDW: 14.3 % (ref 11.0–14.6)
WBC: 17.8 10*3/uL — ABNORMAL HIGH (ref 4.0–10.3)

## 2017-08-20 NOTE — Progress Notes (Signed)
[facility] OFFICE PROGRESS NOTE  Patient Care Team: Lajean Manes, MD as PCP - General (Internal Medicine) Frederik Pear, MD as Attending Physician (Orthopedic Surgery) Heath Lark, MD as Consulting Physician (Hematology and Oncology)  ASSESSMENT & PLAN:  CLL (chronic lymphocytic leukemia) He remained asymptomatic with no signs of disease progression. He had intermittent mild thrombocytopenia, resolved I continue to recommend observation with history, physical examination and blood work on a yearly basis The patient is aware signs and symptoms to watch for disease progression  Prostate cancer [facility]) The patient has diagnosis of prostate cancer He is under close follow-up with urologist He had recent MR of the pelvis which show pelvic lymphadenopathy. I suspect the lymphadenopathy is related to CLL and not necessarily related to prostate cancer I will defer to his urologist for follow-up and management  History of skin cancer He had recent localized skin cancer We discussed close follow-up with dermatologist and aggressive skin protection and avoidance of excessive sun exposure  Obesity (BMI 30.0-34.9) The patient is obese We discussed dietary modification and weight loss strategy   No orders of the defined types were placed in this encounter.   INTERVAL HISTORY: Please see below for problem oriented charting. He returns for further follow-up He is in the process of getting radiation treatment for prostate cancer He is mildly symptomatic He probably wakes up once or twice a day day at night to urinate He denies new lymphadenopathy No recent infection He was seen by dermatologist and was noted to have localized skin cancer, treated with surgery. He has lost some weight since the last time I saw him  SUMMARY OF ONCOLOGIC HISTORY:   CLL (chronic lymphocytic leukemia) ([place])   07/08/2012 Pathology Results    Accession #: [id number]  peripheral blood comfirmed  CLL      06/24/2016 Pathology Results    CLL FISH showed 3.67% positive for deletion 13 q and 5% positive for p53 deletion       REVIEW OF SYSTEMS:   Constitutional: Denies fevers, chills or abnormal weight loss Eyes: Denies blurriness of vision Ears, nose, mouth, throat, and face: Denies mucositis or sore throat Respiratory: Denies cough, dyspnea or wheezes Cardiovascular: Denies palpitation, chest discomfort or lower extremity swelling Gastrointestinal:  Denies nausea, heartburn or change in bowel habits Skin: Denies abnormal skin rashes Lymphatics: Denies new lymphadenopathy or easy bruising Neurological:Denies numbness, tingling or new weaknesses Behavioral/Psych: Mood is stable, no new changes  All other systems were reviewed with the patient and are negative.  I have reviewed the past medical history, past surgical history, social history and family history with the patient and they are unchanged from previous note.  ALLERGIES:  has No Known Allergies.  MEDICATIONS:  Current Outpatient Medications  Medication Sig Dispense Refill  . amLODipine (NORVASC) 5 MG tablet Take 5 mg by mouth 2 (two) times daily.     Marland Kitchen aspirin 81 MG tablet Take 81 mg by mouth daily.    . carvedilol (COREG) 25 MG tablet Take 12.5 mg by mouth 2 (two) times daily with a meal. Takes 12.5 mg daily    . FLUoxetine (PROZAC) 40 MG capsule Take 40 mg by mouth daily as needed.     . hydrochlorothiazide (HYDRODIURIL) 25 MG tablet Take 25 mg by mouth.    Marland Kitchen lisinopril (PRINIVIL,ZESTRIL) 10 MG tablet Take 10 mg by mouth daily.    . pravastatin (PRAVACHOL) 10 MG tablet Take 10 mg by mouth daily.    . sildenafil (VIAGRA)  100 MG tablet Take 100 mg by mouth daily as needed.    . tadalafil (CIALIS) 5 MG tablet Take 5 mg by mouth daily as needed for erectile dysfunction.    . Vitamin D, Ergocalciferol, (DRISDOL) 50000 units CAPS capsule Take 50,000 Units by mouth every 7 (seven) days.     No current  facility-administered medications for this visit.     PHYSICAL EXAMINATION: ECOG PERFORMANCE STATUS: 1 - Symptomatic but completely ambulatory  Vitals:   08/20/17 0940  BP: (!) 142/79  Pulse: 62  Resp: 18  Temp: 98.4 F (36.9 C)  SpO2: 99%   Filed Weights   08/20/17 0940  Weight: 225 lb 1.6 oz (102.1 kg)    GENERAL:alert, no distress and comfortable SKIN: skin color, texture, turgor are normal, no rashes or significant lesions EYES: normal, Conjunctiva are pink and non-injected, sclera clear OROPHARYNX:no exudate, no erythema and lips, buccal mucosa, and tongue normal  NECK: supple, thyroid normal size, non-tender, without nodularity LYMPH:  no palpable lymphadenopathy in the cervical, axillary or inguinal LUNGS: clear to auscultation and percussion with normal breathing effort HEART: regular rate & rhythm and no murmurs and no lower extremity edema ABDOMEN:abdomen soft, non-tender and normal bowel sounds Musculoskeletal:no cyanosis of digits and no clubbing  NEURO: alert & oriented x 3 with fluent speech, no focal motor/sensory deficits  LABORATORY DATA:  I have reviewed the data as listed    Component Value Date/Time   NA 136 06/24/2016 1014   K 3.9 06/24/2016 1014   CL 105 10/06/2012 1047   CO2 24 06/24/2016 1014   GLUCOSE 104 06/24/2016 1014   GLUCOSE 80 10/06/2012 1047   BUN 17.8 06/24/2016 1014   CREATININE 1.20 06/03/2017 1223   CREATININE 0.9 06/24/2016 1014   CALCIUM 9.2 06/24/2016 1014   PROT 6.5 06/24/2016 1014   ALBUMIN 3.7 06/24/2016 1014   AST 16 06/24/2016 1014   ALT 17 06/24/2016 1014   ALKPHOS 55 06/24/2016 1014   BILITOT 0.51 06/24/2016 1014   GFRNONAA 88 (L) 05/04/2012 0616   GFRAA >90 05/04/2012 0616    No results found for: SPEP, UPEP  Lab Results  Component Value Date   WBC 17.8 (H) 08/20/2017   NEUTROABS 4.4 08/20/2017   HGB 15.3 08/20/2017   HCT 45.2 08/20/2017   MCV 92.1 08/20/2017   PLT 144 08/20/2017      Chemistry       Component Value Date/Time   NA 136 06/24/2016 1014   K 3.9 06/24/2016 1014   CL 105 10/06/2012 1047   CO2 24 06/24/2016 1014   BUN 17.8 06/24/2016 1014   CREATININE 1.20 06/03/2017 1223   CREATININE 0.9 06/24/2016 1014      Component Value Date/Time   CALCIUM 9.2 06/24/2016 1014   ALKPHOS 55 06/24/2016 1014   AST 16 06/24/2016 1014   ALT 17 06/24/2016 1014   BILITOT 0.51 06/24/2016 1014    All questions were answered. The patient knows to call the clinic with any problems, questions or concerns. No barriers to learning was detected.  I spent 15 minutes counseling the patient face to face. The total time spent in the appointment was 20 minutes and more than 50% was on counseling and review of test results  Heath Lark, MD 08/20/2017 2:59 PM

## 2017-08-20 NOTE — Assessment & Plan Note (Addendum)
He remained asymptomatic with no signs of disease progression. He had intermittent mild thrombocytopenia, resolved I continue to recommend observation with history, physical examination and blood work on a yearly basis The patient is aware signs and symptoms to watch for disease progression

## 2017-08-20 NOTE — Assessment & Plan Note (Signed)
The patient has diagnosis of prostate cancer He is under close follow-up with urologist He had recent MR of the pelvis which show pelvic lymphadenopathy. I suspect the lymphadenopathy is related to CLL and not necessarily related to prostate cancer I will defer to his urologist for follow-up and management

## 2017-08-20 NOTE — Telephone Encounter (Signed)
Per 3/14 patient decline avs

## 2017-08-20 NOTE — Assessment & Plan Note (Signed)
The patient is obese We discussed dietary modification and weight loss strategy

## 2017-08-20 NOTE — Assessment & Plan Note (Signed)
He had recent localized skin cancer We discussed close follow-up with dermatologist and aggressive skin protection and avoidance of excessive sun exposure

## 2017-12-23 ENCOUNTER — Other Ambulatory Visit: Payer: Self-pay | Admitting: Urology

## 2017-12-23 ENCOUNTER — Telehealth: Payer: Self-pay | Admitting: *Deleted

## 2017-12-23 DIAGNOSIS — C61 Malignant neoplasm of prostate: Secondary | ICD-10-CM

## 2017-12-23 NOTE — Telephone Encounter (Signed)
Called patient to inform of sim appt. On 01-01-18, lvm for a return call

## 2017-12-31 ENCOUNTER — Telehealth: Payer: Self-pay | Admitting: *Deleted

## 2017-12-31 NOTE — Telephone Encounter (Signed)
Called patient to remind of sim for 01-01-18 and his MRI on 01-02-18, spoke with patient and he is aware of these appts

## 2018-01-01 ENCOUNTER — Encounter: Payer: Self-pay | Admitting: Medical Oncology

## 2018-01-01 ENCOUNTER — Ambulatory Visit
Admission: RE | Admit: 2018-01-01 | Discharge: 2018-01-01 | Disposition: A | Discharge status: Home / Self Care | Payer: Medicare Other | Source: Ambulatory Visit | Attending: Radiation Oncology | Admitting: Radiation Oncology

## 2018-01-01 DIAGNOSIS — C61 Malignant neoplasm of prostate: Secondary | ICD-10-CM | POA: Insufficient documentation

## 2018-01-01 NOTE — Progress Notes (Signed)
  Radiation Oncology         [phone] ________________________________  Name: Brian Bean MRN: [id number]  Date: 01/01/2018  DOB: [birth year]  SIMULATION AND TREATMENT PLANNING NOTE    ICD-10-CM   1. Prostate cancer [facility]) C61     DIAGNOSIS:  77 y.o. gentleman with stage T1c adenocarcinoma of the prostate with a Gleason's score of 3+3 and a PSA of 16.9  NARRATIVE:  The patient was brought to the [facility].  Identity was confirmed.  All relevant records and images related to the planned course of therapy were reviewed.  The patient freely provided informed written consent to proceed with treatment after reviewing the details related to the planned course of therapy. The consent form was witnessed and verified by the simulation staff.  Then, the patient was set-up in a stable reproducible supine position for radiation therapy.  A vacuum lock pillow device was custom fabricated to position his legs in a reproducible immobilized position.  Then, I performed a urethrogram under sterile conditions to identify the prostatic apex.  CT images were obtained.  Surface markings were placed.  The CT images were loaded into the planning software.  Then the prostate target and avoidance structures including the rectum, bladder, bowel and hips were contoured.  Treatment planning then occurred.  The radiation prescription was entered and confirmed.  A total of one complex treatment devices was fabricated. I have requested : Intensity Modulated Radiotherapy (IMRT) is medically necessary for this case for the following reason:  Rectal sparing.  PLAN:  The patient will receive 70 Gy in 28 fractions.  ________________________________  Sheral Apley Tammi Klippel, M.D.

## 2018-01-02 ENCOUNTER — Ambulatory Visit (HOSPITAL_COMMUNITY)
Admission: RE | Admit: 2018-01-02 | Discharge: 2018-01-02 | Disposition: A | Discharge status: Home / Self Care | Payer: Medicare Other | Source: Ambulatory Visit | Attending: Urology | Admitting: Urology

## 2018-01-02 DIAGNOSIS — C61 Malignant neoplasm of prostate: Secondary | ICD-10-CM | POA: Diagnosis not present

## 2018-01-11 DIAGNOSIS — C61 Malignant neoplasm of prostate: Secondary | ICD-10-CM | POA: Insufficient documentation

## 2018-01-11 DIAGNOSIS — Z510 Encounter for antineoplastic radiation therapy: Secondary | ICD-10-CM | POA: Diagnosis not present

## 2018-01-12 ENCOUNTER — Encounter: Payer: Self-pay | Admitting: Medical Oncology

## 2018-01-12 ENCOUNTER — Ambulatory Visit
Admission: RE | Admit: 2018-01-12 | Discharge: 2018-01-12 | Disposition: A | Discharge status: Home / Self Care | Payer: Medicare Other | Source: Ambulatory Visit | Attending: Radiation Oncology | Admitting: Radiation Oncology

## 2018-01-12 DIAGNOSIS — Z510 Encounter for antineoplastic radiation therapy: Secondary | ICD-10-CM | POA: Diagnosis not present

## 2018-01-13 ENCOUNTER — Ambulatory Visit
Admission: RE | Admit: 2018-01-13 | Discharge: 2018-01-13 | Disposition: A | Discharge status: Home / Self Care | Payer: Medicare Other | Source: Ambulatory Visit | Attending: Radiation Oncology | Admitting: Radiation Oncology

## 2018-01-13 DIAGNOSIS — Z510 Encounter for antineoplastic radiation therapy: Secondary | ICD-10-CM | POA: Diagnosis not present

## 2018-01-14 ENCOUNTER — Ambulatory Visit
Admission: RE | Admit: 2018-01-14 | Discharge: 2018-01-14 | Disposition: A | Discharge status: Home / Self Care | Payer: Medicare Other | Source: Ambulatory Visit | Attending: Radiation Oncology | Admitting: Radiation Oncology

## 2018-01-14 DIAGNOSIS — Z510 Encounter for antineoplastic radiation therapy: Secondary | ICD-10-CM | POA: Diagnosis not present

## 2018-01-15 ENCOUNTER — Ambulatory Visit
Admission: RE | Admit: 2018-01-15 | Discharge: 2018-01-15 | Disposition: A | Discharge status: Home / Self Care | Payer: Medicare Other | Source: Ambulatory Visit | Attending: Radiation Oncology | Admitting: Radiation Oncology

## 2018-01-15 DIAGNOSIS — Z510 Encounter for antineoplastic radiation therapy: Secondary | ICD-10-CM | POA: Diagnosis not present

## 2018-01-18 ENCOUNTER — Encounter: Payer: Self-pay | Admitting: Radiation Oncology

## 2018-01-18 ENCOUNTER — Telehealth: Payer: Self-pay | Admitting: Radiation Oncology

## 2018-01-18 ENCOUNTER — Ambulatory Visit
Admission: RE | Admit: 2018-01-18 | Discharge: 2018-01-18 | Disposition: A | Discharge status: Home / Self Care | Payer: Medicare Other | Source: Ambulatory Visit | Attending: Radiation Oncology | Admitting: Radiation Oncology

## 2018-01-18 DIAGNOSIS — Z510 Encounter for antineoplastic radiation therapy: Secondary | ICD-10-CM | POA: Diagnosis not present

## 2018-01-18 NOTE — Telephone Encounter (Signed)
Received voicemail message from patient requesting a return call. Phoned patient back. Patient questions which day his PUT encounter will be. I confirmed Friday, August 16th. Patient verbalized understanding and expressed appreciation for the return call.

## 2018-01-19 ENCOUNTER — Ambulatory Visit
Admission: RE | Admit: 2018-01-19 | Discharge: 2018-01-19 | Disposition: A | Discharge status: Home / Self Care | Payer: Medicare Other | Source: Ambulatory Visit | Attending: Radiation Oncology | Admitting: Radiation Oncology

## 2018-01-19 DIAGNOSIS — Z510 Encounter for antineoplastic radiation therapy: Secondary | ICD-10-CM | POA: Diagnosis not present

## 2018-01-20 ENCOUNTER — Ambulatory Visit
Admission: RE | Admit: 2018-01-20 | Discharge: 2018-01-20 | Disposition: A | Discharge status: Home / Self Care | Payer: Medicare Other | Source: Ambulatory Visit | Attending: Radiation Oncology | Admitting: Radiation Oncology

## 2018-01-20 DIAGNOSIS — Z510 Encounter for antineoplastic radiation therapy: Secondary | ICD-10-CM | POA: Diagnosis not present

## 2018-01-21 ENCOUNTER — Ambulatory Visit
Admission: RE | Admit: 2018-01-21 | Discharge: 2018-01-21 | Disposition: A | Discharge status: Home / Self Care | Payer: Medicare Other | Source: Ambulatory Visit | Attending: Radiation Oncology | Admitting: Radiation Oncology

## 2018-01-21 DIAGNOSIS — Z510 Encounter for antineoplastic radiation therapy: Secondary | ICD-10-CM | POA: Diagnosis not present

## 2018-01-22 ENCOUNTER — Ambulatory Visit
Admission: RE | Admit: 2018-01-22 | Discharge: 2018-01-22 | Disposition: A | Discharge status: Home / Self Care | Payer: Medicare Other | Source: Ambulatory Visit | Attending: Radiation Oncology | Admitting: Radiation Oncology

## 2018-01-22 DIAGNOSIS — Z510 Encounter for antineoplastic radiation therapy: Secondary | ICD-10-CM | POA: Diagnosis not present

## 2018-01-24 ENCOUNTER — Ambulatory Visit: Admission: RE | Admit: 2018-01-24 | Payer: Medicare Other | Source: Ambulatory Visit

## 2018-01-25 ENCOUNTER — Ambulatory Visit
Admission: RE | Admit: 2018-01-25 | Discharge: 2018-01-25 | Disposition: A | Discharge status: Home / Self Care | Payer: Medicare Other | Source: Ambulatory Visit | Attending: Radiation Oncology | Admitting: Radiation Oncology

## 2018-01-25 DIAGNOSIS — Z510 Encounter for antineoplastic radiation therapy: Secondary | ICD-10-CM | POA: Diagnosis not present

## 2018-01-26 ENCOUNTER — Ambulatory Visit
Admission: RE | Admit: 2018-01-26 | Discharge: 2018-01-26 | Disposition: A | Discharge status: Home / Self Care | Payer: Medicare Other | Source: Ambulatory Visit | Attending: Radiation Oncology | Admitting: Radiation Oncology

## 2018-01-26 DIAGNOSIS — Z510 Encounter for antineoplastic radiation therapy: Secondary | ICD-10-CM | POA: Diagnosis not present

## 2018-01-27 ENCOUNTER — Ambulatory Visit
Admission: RE | Admit: 2018-01-27 | Discharge: 2018-01-27 | Disposition: A | Discharge status: Home / Self Care | Payer: Medicare Other | Source: Ambulatory Visit | Attending: Radiation Oncology | Admitting: Radiation Oncology

## 2018-01-27 DIAGNOSIS — Z510 Encounter for antineoplastic radiation therapy: Secondary | ICD-10-CM | POA: Diagnosis not present

## 2018-01-28 ENCOUNTER — Ambulatory Visit
Admission: RE | Admit: 2018-01-28 | Discharge: 2018-01-28 | Disposition: A | Discharge status: Home / Self Care | Payer: Medicare Other | Source: Ambulatory Visit | Attending: Radiation Oncology | Admitting: Radiation Oncology

## 2018-01-28 DIAGNOSIS — Z510 Encounter for antineoplastic radiation therapy: Secondary | ICD-10-CM | POA: Diagnosis not present

## 2018-01-29 ENCOUNTER — Ambulatory Visit
Admission: RE | Admit: 2018-01-29 | Discharge: 2018-01-29 | Disposition: A | Discharge status: Home / Self Care | Payer: Medicare Other | Source: Ambulatory Visit | Attending: Radiation Oncology | Admitting: Radiation Oncology

## 2018-01-29 DIAGNOSIS — Z510 Encounter for antineoplastic radiation therapy: Secondary | ICD-10-CM | POA: Diagnosis not present

## 2018-02-01 ENCOUNTER — Ambulatory Visit
Admission: RE | Admit: 2018-02-01 | Discharge: 2018-02-01 | Disposition: A | Discharge status: Home / Self Care | Payer: Medicare Other | Source: Ambulatory Visit | Attending: Radiation Oncology | Admitting: Radiation Oncology

## 2018-02-01 DIAGNOSIS — Z510 Encounter for antineoplastic radiation therapy: Secondary | ICD-10-CM | POA: Diagnosis not present

## 2018-02-02 ENCOUNTER — Ambulatory Visit
Admission: RE | Admit: 2018-02-02 | Discharge: 2018-02-02 | Disposition: A | Discharge status: Home / Self Care | Payer: Medicare Other | Source: Ambulatory Visit | Attending: Radiation Oncology | Admitting: Radiation Oncology

## 2018-02-02 DIAGNOSIS — Z510 Encounter for antineoplastic radiation therapy: Secondary | ICD-10-CM | POA: Diagnosis not present

## 2018-02-03 ENCOUNTER — Ambulatory Visit
Admission: RE | Admit: 2018-02-03 | Discharge: 2018-02-03 | Disposition: A | Discharge status: Home / Self Care | Payer: Medicare Other | Source: Ambulatory Visit | Attending: Radiation Oncology | Admitting: Radiation Oncology

## 2018-02-03 ENCOUNTER — Telehealth: Payer: Self-pay | Admitting: *Deleted

## 2018-02-03 DIAGNOSIS — Z510 Encounter for antineoplastic radiation therapy: Secondary | ICD-10-CM | POA: Diagnosis not present

## 2018-02-03 NOTE — Telephone Encounter (Signed)
Returned patient's phone call, lvm for a return call 

## 2018-02-04 ENCOUNTER — Ambulatory Visit
Admission: RE | Admit: 2018-02-04 | Discharge: 2018-02-04 | Disposition: A | Discharge status: Home / Self Care | Payer: Medicare Other | Source: Ambulatory Visit | Attending: Radiation Oncology | Admitting: Radiation Oncology

## 2018-02-04 DIAGNOSIS — Z510 Encounter for antineoplastic radiation therapy: Secondary | ICD-10-CM | POA: Diagnosis not present

## 2018-02-05 ENCOUNTER — Ambulatory Visit
Admission: RE | Admit: 2018-02-05 | Discharge: 2018-02-05 | Disposition: A | Discharge status: Home / Self Care | Payer: Medicare Other | Source: Ambulatory Visit | Attending: Radiation Oncology | Admitting: Radiation Oncology

## 2018-02-05 DIAGNOSIS — Z510 Encounter for antineoplastic radiation therapy: Secondary | ICD-10-CM | POA: Diagnosis not present

## 2018-02-09 ENCOUNTER — Ambulatory Visit
Admission: RE | Admit: 2018-02-09 | Discharge: 2018-02-09 | Disposition: A | Discharge status: Home / Self Care | Payer: Medicare Other | Source: Ambulatory Visit | Attending: Radiation Oncology | Admitting: Radiation Oncology

## 2018-02-09 DIAGNOSIS — C61 Malignant neoplasm of prostate: Secondary | ICD-10-CM | POA: Diagnosis present

## 2018-02-09 DIAGNOSIS — Z510 Encounter for antineoplastic radiation therapy: Secondary | ICD-10-CM | POA: Insufficient documentation

## 2018-02-10 ENCOUNTER — Ambulatory Visit
Admission: RE | Admit: 2018-02-10 | Discharge: 2018-02-10 | Disposition: A | Discharge status: Home / Self Care | Payer: Medicare Other | Source: Ambulatory Visit | Attending: Radiation Oncology | Admitting: Radiation Oncology

## 2018-02-10 DIAGNOSIS — Z510 Encounter for antineoplastic radiation therapy: Secondary | ICD-10-CM | POA: Diagnosis not present

## 2018-02-11 ENCOUNTER — Ambulatory Visit
Admission: RE | Admit: 2018-02-11 | Discharge: 2018-02-11 | Disposition: A | Discharge status: Home / Self Care | Payer: Medicare Other | Source: Ambulatory Visit | Attending: Radiation Oncology | Admitting: Radiation Oncology

## 2018-02-11 DIAGNOSIS — Z510 Encounter for antineoplastic radiation therapy: Secondary | ICD-10-CM | POA: Diagnosis not present

## 2018-02-12 ENCOUNTER — Ambulatory Visit
Admission: RE | Admit: 2018-02-12 | Discharge: 2018-02-12 | Disposition: A | Discharge status: Home / Self Care | Payer: Medicare Other | Source: Ambulatory Visit | Attending: Radiation Oncology | Admitting: Radiation Oncology

## 2018-02-12 DIAGNOSIS — Z510 Encounter for antineoplastic radiation therapy: Secondary | ICD-10-CM | POA: Diagnosis not present

## 2018-02-15 ENCOUNTER — Ambulatory Visit
Admission: RE | Admit: 2018-02-15 | Discharge: 2018-02-15 | Disposition: A | Discharge status: Home / Self Care | Payer: Medicare Other | Source: Ambulatory Visit | Attending: Radiation Oncology | Admitting: Radiation Oncology

## 2018-02-15 DIAGNOSIS — Z510 Encounter for antineoplastic radiation therapy: Secondary | ICD-10-CM | POA: Diagnosis not present

## 2018-02-16 ENCOUNTER — Ambulatory Visit
Admission: RE | Admit: 2018-02-16 | Discharge: 2018-02-16 | Disposition: A | Discharge status: Home / Self Care | Payer: Medicare Other | Source: Ambulatory Visit | Attending: Radiation Oncology | Admitting: Radiation Oncology

## 2018-02-16 DIAGNOSIS — Z510 Encounter for antineoplastic radiation therapy: Secondary | ICD-10-CM | POA: Diagnosis not present

## 2018-02-17 ENCOUNTER — Ambulatory Visit
Admission: RE | Admit: 2018-02-17 | Discharge: 2018-02-17 | Disposition: A | Discharge status: Home / Self Care | Payer: Medicare Other | Source: Ambulatory Visit | Attending: Radiation Oncology | Admitting: Radiation Oncology

## 2018-02-17 DIAGNOSIS — Z510 Encounter for antineoplastic radiation therapy: Secondary | ICD-10-CM | POA: Diagnosis not present

## 2018-02-18 ENCOUNTER — Ambulatory Visit
Admission: RE | Admit: 2018-02-18 | Discharge: 2018-02-18 | Disposition: A | Discharge status: Home / Self Care | Payer: Medicare Other | Source: Ambulatory Visit | Attending: Radiation Oncology | Admitting: Radiation Oncology

## 2018-02-18 DIAGNOSIS — Z510 Encounter for antineoplastic radiation therapy: Secondary | ICD-10-CM | POA: Diagnosis not present

## 2018-02-19 ENCOUNTER — Encounter: Payer: Self-pay | Admitting: Medical Oncology

## 2018-02-19 ENCOUNTER — Ambulatory Visit
Admission: RE | Admit: 2018-02-19 | Discharge: 2018-02-19 | Disposition: A | Discharge status: Home / Self Care | Payer: Medicare Other | Source: Ambulatory Visit | Attending: Radiation Oncology | Admitting: Radiation Oncology

## 2018-02-19 ENCOUNTER — Encounter: Payer: Self-pay | Admitting: Radiation Oncology

## 2018-02-19 DIAGNOSIS — Z510 Encounter for antineoplastic radiation therapy: Secondary | ICD-10-CM | POA: Diagnosis not present

## 2018-02-25 NOTE — Progress Notes (Signed)
°  Radiation Oncology         [phone] ________________________________  Name: Brian Bean MRN: [id number]  Date: 02/19/2018  DOB: 02-02-1941  End of Treatment Note  Diagnosis:   77 y.o. male with stage T1cadenocarcinoma of the prostate with a Gleason's score of 3+3and a PSA of 16.9     Indication for treatment:  Curative, Definitive Radiotherapy       Radiation treatment dates:   01/12/2018 - 02/19/2018  Site/dose:   The prostate was treated to 70 Gy in 28 fractions of 2.5 Gy  Beams/energy:   The patient was treated with IMRT using volumetric arc therapy delivering 6 MV X-rays to clockwise and counterclockwise circumferential arcs with a 90 degree collimator offset to avoid dose scalloping.  Image guidance was performed with daily cone beam CT prior to each fraction to align to gold markers in the prostate and assure proper bladder and rectal fill volumes.  Immobilization was achieved with [employer] custom mold.  Narrative: The patient tolerated radiation treatment relatively well.   He experienced modest fatigue and some minor urinary irritation with frequency, some urgency, weak intermittent stream, incomplete emptying, nocturia x3-4, and occasional dysuria. He denied hematuria. He also reported having small, more frequent bowel movements towards the end of treatment.  Plan: The patient has completed radiation treatment. He will return to radiation oncology clinic for routine followup in one month. I advised him to call or return sooner if he has any questions or concerns related to his recovery or treatment. ________________________________  Sheral Apley. Tammi Klippel, M.D.  This document serves as a record of services personally performed by Tyler Pita, MD. It was created on his behalf by Rae Lips, a trained medical scribe. The creation of this record is based on the scribe's personal observations and the provider's statements to them. This document has been checked and approved  by the attending provider.

## 2018-03-24 ENCOUNTER — Other Ambulatory Visit: Payer: Self-pay | Admitting: Urology

## 2018-03-24 ENCOUNTER — Ambulatory Visit
Admission: RE | Admit: 2018-03-24 | Discharge: 2018-03-24 | Disposition: A | Discharge status: Home / Self Care | Payer: Medicare Other | Source: Ambulatory Visit | Attending: Urology | Admitting: Urology

## 2018-03-24 ENCOUNTER — Encounter: Payer: Self-pay | Admitting: Urology

## 2018-03-24 ENCOUNTER — Other Ambulatory Visit: Payer: Self-pay

## 2018-03-24 VITALS — BP 132/88 | HR 68 | Temp 98.1°F | Resp 20 | Ht 70.0 in | Wt 222.8 lb

## 2018-03-24 DIAGNOSIS — N529 Male erectile dysfunction, unspecified: Secondary | ICD-10-CM | POA: Diagnosis not present

## 2018-03-24 DIAGNOSIS — Z79899 Other long term (current) drug therapy: Secondary | ICD-10-CM | POA: Insufficient documentation

## 2018-03-24 DIAGNOSIS — C61 Malignant neoplasm of prostate: Secondary | ICD-10-CM | POA: Insufficient documentation

## 2018-03-24 DIAGNOSIS — Z923 Personal history of irradiation: Secondary | ICD-10-CM | POA: Insufficient documentation

## 2018-03-24 DIAGNOSIS — Z7982 Long term (current) use of aspirin: Secondary | ICD-10-CM | POA: Insufficient documentation

## 2018-03-24 MED ORDER — VARDENAFIL HCL 10 MG PO TBDP: 1.0000 | ORAL_TABLET | ORAL | 1 refills | Status: DC | PRN

## 2018-03-24 NOTE — Progress Notes (Addendum)
Radiation Oncology         [phone]) [phone] ________________________________  Name: Brian Bean MRN: [id number]  Date: 03/24/2018  DOB: Oct 13, 1940  Post Treatment Note  CC: Lajean Manes, MD  Raynelle Bring, MD  Diagnosis:   77 y.o. male with stage T1cadenocarcinoma of the prostate with a Gleason's score of 3+3and a PSA of 16.9   Interval Since Last Radiation:  4 weeks  01/12/2018 - 02/19/2018:  The prostate was treated to 70 Gy in 28 fractions of 2.5 Gy  Narrative:  The patient returns today for routine follow-up.  He tolerated radiation treatment relatively well.   He experienced modest fatigue and some minor urinary irritation with frequency, some urgency, weak intermittent stream, incomplete emptying, nocturia x3-4, and occasional dysuria. He denied hematuria. He also reported having small, more frequent bowel movements towards the end of treatment.                              On review of systems, the patient states that he is doing very well overall.  He continues with persistent fatigue which is gradually improving.  He also reports mild urgency, weak stream, nocturia x2 per night and occasional feelings of incomplete emptying.  He specifically denies gross hematuria, dysuria, straining to void, hesitancy, intermittency, excessive daytime frequency and in general, reports that he is able to empty his bladder well on most occasions.  His current IPSS score is 11, indicating moderate urinary symptoms.  He does feel that his urinary symptoms are gradually improving as well.  Overall, he is quite pleased with his progress to date.  ALLERGIES:  has No Known Allergies.  Meds: Current Outpatient Medications  Medication Sig Dispense Refill  . amLODipine (NORVASC) 5 MG tablet Take 5 mg by mouth 2 (two) times daily.     Marland Kitchen aspirin 81 MG tablet Take 81 mg by mouth daily.    . carvedilol (COREG) 25 MG tablet Take 12.5 mg by mouth 2 (two) times daily with a meal. Takes 12.5 mg daily    .  FLUoxetine (PROZAC) 40 MG capsule Take 40 mg by mouth daily as needed.     . hydrochlorothiazide (HYDRODIURIL) 25 MG tablet Take 25 mg by mouth.    Marland Kitchen lisinopril (PRINIVIL,ZESTRIL) 10 MG tablet Take 10 mg by mouth daily.    . pravastatin (PRAVACHOL) 10 MG tablet Take 10 mg by mouth daily.    . sildenafil (VIAGRA) 100 MG tablet Take 100 mg by mouth daily as needed.    . tadalafil (CIALIS) 5 MG tablet Take 5 mg by mouth daily as needed for erectile dysfunction.    . Vitamin D, Ergocalciferol, (DRISDOL) 50000 units CAPS capsule Take 50,000 Units by mouth every 7 (seven) days.    . Vardenafil HCl 10 MG TBDP Take 1 tablet by mouth as needed. 30 tablet 1   No current facility-administered medications for this encounter.     Physical Findings:  height is 5\' 10"  (1.778 m) and weight is 222 lb 12.8 oz (101.1 kg). His oral temperature is 98.1 F (36.7 C). His blood pressure is 132/88 and his pulse is 68. His respiration is 20 and oxygen saturation is 98%.  Pain Assessment Pain Score: 0-No pain/10 In general this is a well appearing Caucasian male in no acute distress.  He's alert and oriented x4 and appropriate throughout the examination. Cardiopulmonary assessment is negative for acute distress and he exhibits normal effort.  Lab Findings: Lab Results  Component Value Date   WBC 17.8 (H) 08/20/2017   HGB 15.3 08/20/2017   HCT 45.2 08/20/2017   MCV 92.1 08/20/2017   PLT 144 08/20/2017     Radiographic Findings: No results found.  Impression/Plan: 1. 77 y.o. male with stage T1cadenocarcinoma of the prostate with a Gleason's score of 3+3and a PSA of 16.9.   He will continue to follow up with urology for ongoing PSA determinations but does not currently have a follow-up appointment scheduled with Dr. Louis Meckel. He understands what to expect with regards to PSA monitoring going forward. I will look forward to following his response to treatment via correspondence with urology, and would be happy  to continue to participate in his care if clinically indicated. I talked to the patient about what to expect in the future, including his risk for erectile dysfunction and rectal bleeding. I encouraged him to call or return to the office if he has any questions regarding his previous radiation or possible radiation side effects. He was comfortable with this plan and will follow up as needed. 2. Erectile dysfunction.  I have provided a prescription for Staxyn at the patient's request.  He understands that further refills of this medication will need to be requested through Dr. Carlton Adam office.    Nicholos Johns, PA-C

## 2018-04-06 ENCOUNTER — Other Ambulatory Visit: Payer: Self-pay | Admitting: Hematology and Oncology

## 2018-04-06 DIAGNOSIS — C9110 Chronic lymphocytic leukemia of B-cell type not having achieved remission: Secondary | ICD-10-CM

## 2018-05-10 ENCOUNTER — Encounter: Payer: Self-pay | Admitting: Hematology and Oncology

## 2018-05-10 ENCOUNTER — Inpatient Hospital Stay: Payer: Medicare Other

## 2018-05-10 ENCOUNTER — Inpatient Hospital Stay: Payer: Medicare Other | Attending: Hematology and Oncology | Admitting: Hematology and Oncology

## 2018-05-10 ENCOUNTER — Telehealth: Payer: Self-pay | Admitting: Hematology and Oncology

## 2018-05-10 DIAGNOSIS — E669 Obesity, unspecified: Secondary | ICD-10-CM | POA: Insufficient documentation

## 2018-05-10 DIAGNOSIS — Z7982 Long term (current) use of aspirin: Secondary | ICD-10-CM | POA: Insufficient documentation

## 2018-05-10 DIAGNOSIS — C9110 Chronic lymphocytic leukemia of B-cell type not having achieved remission: Secondary | ICD-10-CM | POA: Insufficient documentation

## 2018-05-10 DIAGNOSIS — C61 Malignant neoplasm of prostate: Secondary | ICD-10-CM | POA: Insufficient documentation

## 2018-05-10 DIAGNOSIS — Z79899 Other long term (current) drug therapy: Secondary | ICD-10-CM | POA: Insufficient documentation

## 2018-05-10 DIAGNOSIS — Z85828 Personal history of other malignant neoplasm of skin: Secondary | ICD-10-CM | POA: Diagnosis not present

## 2018-05-10 LAB — COMPREHENSIVE METABOLIC PANEL
ALT: 19 U/L (ref 0–44)
AST: 17 U/L (ref 15–41)
Albumin: 3.9 g/dL (ref 3.5–5.0)
Alkaline Phosphatase: 42 U/L (ref 38–126)
Anion gap: 8 (ref 5–15)
BUN: 18 mg/dL (ref 8–23)
CO2: 28 mmol/L (ref 22–32)
Calcium: 8.9 mg/dL (ref 8.9–10.3)
Chloride: 104 mmol/L (ref 98–111)
Creatinine, Ser: 0.94 mg/dL (ref 0.61–1.24)
GFR calc Af Amer: >60 mL/min (ref >60)
GFR calc non Af Amer: >60 mL/min (ref >60)
Glucose, Bld: 141 mg/dL — ABNORMAL HIGH (ref 70–99)
Potassium: 3.4 mmol/L — ABNORMAL LOW (ref 3.5–5.1)
Sodium: 140 mmol/L (ref 135–145)
Total Bilirubin: 0.7 mg/dL (ref 0.3–1.2)
Total Protein: 6.5 g/dL (ref 6.5–8.1)

## 2018-05-10 LAB — CBC WITH DIFFERENTIAL/PLATELET
Abs Immature Granulocytes: 0.01 10*3/uL (ref 0.00–0.07)
Basophils Absolute: 0 10*3/uL (ref 0.0–0.1)
Basophils Relative: 1 %
Eosinophils Absolute: 0.1 10*3/uL (ref 0.0–0.5)
Eosinophils Relative: 2 %
HCT: 43.3 % (ref 39.0–52.0)
Hemoglobin: 14.8 g/dL (ref 13.0–17.0)
Immature Granulocytes: 0 %
Lymphocytes Relative: 44 %
Lymphs Abs: 3.8 10*3/uL (ref 0.7–4.0)
MCH: 30.9 pg (ref 26.0–34.0)
MCHC: 34.2 g/dL (ref 30.0–36.0)
MCV: 90.4 fL (ref 80.0–100.0)
Monocytes Absolute: 0.4 10*3/uL (ref 0.1–1.0)
Monocytes Relative: 4 %
Neutro Abs: 4.3 10*3/uL (ref 1.7–7.7)
Neutrophils Relative %: 49 %
Platelets: 150 10*3/uL (ref 150–400)
RBC: 4.79 MIL/uL (ref 4.22–5.81)
RDW: 13.4 % (ref 11.5–15.5)
WBC: 8.7 10*3/uL (ref 4.0–10.5)
nRBC: 0 % (ref 0.0–0.2)

## 2018-05-10 LAB — LACTATE DEHYDROGENASE: LDH: 263 U/L — ABNORMAL HIGH (ref 98–192)

## 2018-05-10 NOTE — Assessment & Plan Note (Signed)
He is currently treated for this by radiation oncologist with follow-up to see urologist.  Side effects from treatment are resolving

## 2018-05-10 NOTE — Progress Notes (Signed)
[facility] OFFICE PROGRESS NOTE  Patient Care Team: Lajean Manes, MD as PCP - General (Internal Medicine) Frederik Pear, MD as Attending Physician (Orthopedic Surgery) Heath Lark, MD as Consulting Physician (Hematology and Oncology)  ASSESSMENT & PLAN:  CLL (chronic lymphocytic leukemia) His CBC has completely normalized since completion of radiation therapy I suspect the radiation treatment has helped I plan to see him back next summer for further follow-up.  Prostate cancer [facility]) He is currently treated for this by radiation oncologist with follow-up to see urologist.  Side effects from treatment are resolving  History of skin cancer He has appointment to see dermatologist.  Obesity (BMI 30.0-34.9) We had extensive discussions about this and he is motivated to lose weight   No orders of the defined types were placed in this encounter.   INTERVAL HISTORY: Please see below for problem oriented charting. He returns for further follow-up He has completed radiation therapy not long ago for prostate cancer His energy level has improved Denies recent infection no new lymphadenopathy He is being treated by dermatologist for skin cancer but denies new skin lesions  SUMMARY OF ONCOLOGIC HISTORY:   CLL (chronic lymphocytic leukemia) ([place])   07/08/2012 Pathology Results    Accession #: [id number]  peripheral blood comfirmed CLL    06/24/2016 Pathology Results    CLL FISH showed 3.67% positive for deletion 13 q and 5% positive for p53 deletion     REVIEW OF SYSTEMS:   Constitutional: Denies fevers, chills or abnormal weight loss Eyes: Denies blurriness of vision Ears, nose, mouth, throat, and face: Denies mucositis or sore throat Respiratory: Denies cough, dyspnea or wheezes Cardiovascular: Denies palpitation, chest discomfort or lower extremity swelling Gastrointestinal:  Denies nausea, heartburn or change in bowel habits Skin: Denies abnormal skin  rashes Lymphatics: Denies new lymphadenopathy or easy bruising Neurological:Denies numbness, tingling or new weaknesses Behavioral/Psych: Mood is stable, no new changes  All other systems were reviewed with the patient and are negative.  I have reviewed the past medical history, past surgical history, social history and family history with the patient and they are unchanged from previous note.  ALLERGIES:  has No Known Allergies.  MEDICATIONS:  Current Outpatient Medications  Medication Sig Dispense Refill  . amLODipine (NORVASC) 5 MG tablet Take 5 mg by mouth 2 (two) times daily.     Marland Kitchen aspirin 81 MG tablet Take 81 mg by mouth daily.    . carvedilol (COREG) 25 MG tablet Take 12.5 mg by mouth 2 (two) times daily with a meal. Takes 12.5 mg daily    . FLUoxetine (PROZAC) 40 MG capsule Take 40 mg by mouth daily as needed.     . hydrochlorothiazide (HYDRODIURIL) 25 MG tablet Take 25 mg by mouth.    Marland Kitchen lisinopril (PRINIVIL,ZESTRIL) 10 MG tablet Take 10 mg by mouth daily.    . pravastatin (PRAVACHOL) 10 MG tablet Take 10 mg by mouth daily.    . sildenafil (VIAGRA) 100 MG tablet Take 100 mg by mouth daily as needed.    . tadalafil (CIALIS) 5 MG tablet Take 5 mg by mouth daily as needed for erectile dysfunction.    . Vardenafil HCl 10 MG TBDP Take 1 tablet by mouth as needed. 30 tablet 1  . Vitamin D, Ergocalciferol, (DRISDOL) 50000 units CAPS capsule Take 50,000 Units by mouth every 7 (seven) days.     No current facility-administered medications for this visit.     PHYSICAL EXAMINATION: ECOG PERFORMANCE STATUS: 1 - Symptomatic  but completely ambulatory  Vitals:   05/10/18 1225  BP: 131/62  Pulse: 76  Resp: 18  Temp: 97.7 F (36.5 C)  SpO2: 98%   Filed Weights   05/10/18 1225  Weight: 227 lb (103 kg)    GENERAL:alert, no distress and comfortable Musculoskeletal:no cyanosis of digits and no clubbing  NEURO: alert & oriented x 3 with fluent speech, no focal motor/sensory  deficits  LABORATORY DATA:  I have reviewed the data as listed    Component Value Date/Time   NA 140 05/10/2018 1155   NA 136 06/24/2016 1014   K 3.4 (L) 05/10/2018 1155   K 3.9 06/24/2016 1014   CL 104 05/10/2018 1155   CL 105 10/06/2012 1047   CO2 28 05/10/2018 1155   CO2 24 06/24/2016 1014   GLUCOSE 141 (H) 05/10/2018 1155   GLUCOSE 104 06/24/2016 1014   GLUCOSE 80 10/06/2012 1047   BUN 18 05/10/2018 1155   BUN 17.8 06/24/2016 1014   CREATININE 0.94 05/10/2018 1155   CREATININE 0.9 06/24/2016 1014   CALCIUM 8.9 05/10/2018 1155   CALCIUM 9.2 06/24/2016 1014   PROT 6.5 05/10/2018 1155   PROT 6.5 06/24/2016 1014   ALBUMIN 3.9 05/10/2018 1155   ALBUMIN 3.7 06/24/2016 1014   AST 17 05/10/2018 1155   AST 16 06/24/2016 1014   ALT 19 05/10/2018 1155   ALT 17 06/24/2016 1014   ALKPHOS 42 05/10/2018 1155   ALKPHOS 55 06/24/2016 1014   BILITOT 0.7 05/10/2018 1155   BILITOT 0.51 06/24/2016 1014   GFRNONAA >60 05/10/2018 1155   GFRAA >60 05/10/2018 1155    No results found for: SPEP, UPEP  Lab Results  Component Value Date   WBC 8.7 05/10/2018   NEUTROABS 4.3 05/10/2018   HGB 14.8 05/10/2018   HCT 43.3 05/10/2018   MCV 90.4 05/10/2018   PLT 150 05/10/2018      Chemistry      Component Value Date/Time   NA 140 05/10/2018 1155   NA 136 06/24/2016 1014   K 3.4 (L) 05/10/2018 1155   K 3.9 06/24/2016 1014   CL 104 05/10/2018 1155   CL 105 10/06/2012 1047   CO2 28 05/10/2018 1155   CO2 24 06/24/2016 1014   BUN 18 05/10/2018 1155   BUN 17.8 06/24/2016 1014   CREATININE 0.94 05/10/2018 1155   CREATININE 0.9 06/24/2016 1014      Component Value Date/Time   CALCIUM 8.9 05/10/2018 1155   CALCIUM 9.2 06/24/2016 1014   ALKPHOS 42 05/10/2018 1155   ALKPHOS 55 06/24/2016 1014   AST 17 05/10/2018 1155   AST 16 06/24/2016 1014   ALT 19 05/10/2018 1155   ALT 17 06/24/2016 1014   BILITOT 0.7 05/10/2018 1155   BILITOT 0.51 06/24/2016 1014    All questions were  answered. The patient knows to call the clinic with any problems, questions or concerns. No barriers to learning was detected.  I spent 10 minutes counseling the patient face to face. The total time spent in the appointment was 15 minutes and more than 50% was on counseling and review of test results  Heath Lark, MD 05/10/2018 1:13 PM

## 2018-05-10 NOTE — Telephone Encounter (Signed)
Gave patient calendar.  Did not want avs.

## 2018-05-10 NOTE — Assessment & Plan Note (Signed)
We had extensive discussions about this and he is motivated to lose weight

## 2018-05-10 NOTE — Assessment & Plan Note (Signed)
He has appointment to see dermatologist.

## 2018-05-10 NOTE — Assessment & Plan Note (Signed)
His CBC has completely normalized since completion of radiation therapy I suspect the radiation treatment has helped I plan to see him back next summer for further follow-up.

## 2019-01-06 ENCOUNTER — Inpatient Hospital Stay (HOSPITAL_BASED_OUTPATIENT_CLINIC_OR_DEPARTMENT_OTHER): Payer: Medicare Other | Admitting: Hematology and Oncology

## 2019-01-06 ENCOUNTER — Other Ambulatory Visit: Payer: Self-pay | Admitting: Hematology and Oncology

## 2019-01-06 ENCOUNTER — Inpatient Hospital Stay: Payer: Medicare Other | Attending: Hematology and Oncology

## 2019-01-06 ENCOUNTER — Encounter: Payer: Self-pay | Admitting: Hematology and Oncology

## 2019-01-06 ENCOUNTER — Other Ambulatory Visit: Payer: Self-pay

## 2019-01-06 DIAGNOSIS — C9110 Chronic lymphocytic leukemia of B-cell type not having achieved remission: Secondary | ICD-10-CM

## 2019-01-06 DIAGNOSIS — Z85828 Personal history of other malignant neoplasm of skin: Secondary | ICD-10-CM

## 2019-01-06 DIAGNOSIS — Z79899 Other long term (current) drug therapy: Secondary | ICD-10-CM | POA: Insufficient documentation

## 2019-01-06 DIAGNOSIS — Z923 Personal history of irradiation: Secondary | ICD-10-CM | POA: Insufficient documentation

## 2019-01-06 DIAGNOSIS — E669 Obesity, unspecified: Secondary | ICD-10-CM | POA: Insufficient documentation

## 2019-01-06 DIAGNOSIS — C61 Malignant neoplasm of prostate: Secondary | ICD-10-CM | POA: Diagnosis not present

## 2019-01-06 LAB — CBC WITH DIFFERENTIAL/PLATELET
Abs Immature Granulocytes: 0.02 10*3/uL (ref 0.00–0.07)
Basophils Absolute: 0 10*3/uL (ref 0.0–0.1)
Basophils Relative: 0 %
Eosinophils Absolute: 0.1 10*3/uL (ref 0.0–0.5)
Eosinophils Relative: 1 %
HCT: 44.1 % (ref 39.0–52.0)
Hemoglobin: 14.8 g/dL (ref 13.0–17.0)
Immature Granulocytes: 0 %
Lymphocytes Relative: 54 %
Lymphs Abs: 5.7 10*3/uL — ABNORMAL HIGH (ref 0.7–4.0)
MCH: 30.7 pg (ref 26.0–34.0)
MCHC: 33.6 g/dL (ref 30.0–36.0)
MCV: 91.5 fL (ref 80.0–100.0)
Monocytes Absolute: 0.5 10*3/uL (ref 0.1–1.0)
Monocytes Relative: 5 %
Neutro Abs: 4.4 10*3/uL (ref 1.7–7.7)
Neutrophils Relative %: 40 %
Platelets: 154 10*3/uL (ref 150–400)
RBC: 4.82 MIL/uL (ref 4.22–5.81)
RDW: 13.2 % (ref 11.5–15.5)
WBC: 10.7 10*3/uL — ABNORMAL HIGH (ref 4.0–10.5)
nRBC: 0 % (ref 0.0–0.2)

## 2019-01-06 NOTE — Assessment & Plan Note (Signed)
We had extensive discussions about this and he is motivated to lose weight

## 2019-01-06 NOTE — Assessment & Plan Note (Signed)
He has recurrent lymphocytosis again Last year, his CBC was normal due to radiation treatment effects Examination today is benign I will see him once a year We discussed the importance of influenza vaccination

## 2019-01-06 NOTE — Assessment & Plan Note (Signed)
He has appointment to see dermatologist. He had multiple cryotherapy We discussed the importance of avoidance of excessive sun exposure due to risk of skin cancer with CLL diagnosis

## 2019-01-06 NOTE — Assessment & Plan Note (Signed)
He has recovered fully from side effects of radiation He has appointment to follow with urologist for PSA monitoring Currently, he is not symptomatic

## 2019-01-06 NOTE — Progress Notes (Signed)
[place] OFFICE PROGRESS NOTE  Patient Care Team: Lajean Manes, MD as PCP - General (Internal Medicine) Frederik Pear, MD as Attending Physician (Orthopedic Surgery) Heath Lark, MD as Consulting Physician (Hematology and Oncology)  ASSESSMENT & PLAN:  CLL (chronic lymphocytic leukemia) He has recurrent lymphocytosis again Last year, his CBC was normal due to radiation treatment effects Examination today is benign I will see him once a year We discussed the importance of influenza vaccination  History of skin cancer He has appointment to see dermatologist. He had multiple cryotherapy We discussed the importance of avoidance of excessive sun exposure due to risk of skin cancer with CLL diagnosis  Prostate cancer [facility]) He has recovered fully from side effects of radiation He has appointment to follow with urologist for PSA monitoring Currently, he is not symptomatic  Obesity (BMI 30.0-34.9) We had extensive discussions about this and he is motivated to lose weight   Orders Placed This Encounter  Procedures  . CBC with Differential/Platelet    Standing Status:   Future    Standing Expiration Date:   02/10/2020    INTERVAL HISTORY: Please see below for problem oriented charting. He returns for further follow-up He had recent cryotherapy for skin cancer He denies urinary symptoms No new lymphadenopathy Denies recent infection, fever or chills His appetite is good and he has gained some weight since last time I saw him.  SUMMARY OF ONCOLOGIC HISTORY: Oncology History  CLL (chronic lymphocytic leukemia) ([place])  07/08/2012 Pathology Results   Accession #: [id number]  peripheral blood comfirmed CLL   06/24/2016 Pathology Results   CLL FISH showed 3.67% positive for deletion 13 q and 5% positive for p53 deletion     REVIEW OF SYSTEMS:   Constitutional: Denies fevers, chills or abnormal weight loss Eyes: Denies blurriness of vision Ears, nose, mouth,  throat, and face: Denies mucositis or sore throat Respiratory: Denies cough, dyspnea or wheezes Cardiovascular: Denies palpitation, chest discomfort or lower extremity swelling Gastrointestinal:  Denies nausea, heartburn or change in bowel habits Skin: Denies abnormal skin rashes Lymphatics: Denies new lymphadenopathy or easy bruising Neurological:Denies numbness, tingling or new weaknesses Behavioral/Psych: Mood is stable, no new changes  All other systems were reviewed with the patient and are negative.  I have reviewed the past medical history, past surgical history, social history and family history with the patient and they are unchanged from previous note.  ALLERGIES:  has No Known Allergies.  MEDICATIONS:  Current Outpatient Medications  Medication Sig Dispense Refill  . amLODipine (NORVASC) 5 MG tablet Take 5 mg by mouth 2 (two) times daily.     . carvedilol (COREG) 25 MG tablet Take 12.5 mg by mouth 2 (two) times daily with a meal. Takes 12.5 mg daily    . FLUoxetine (PROZAC) 40 MG capsule Take 40 mg by mouth daily as needed.     . hydrochlorothiazide (HYDRODIURIL) 25 MG tablet Take 25 mg by mouth.    Marland Kitchen lisinopril (PRINIVIL,ZESTRIL) 10 MG tablet Take 10 mg by mouth daily.    . pravastatin (PRAVACHOL) 10 MG tablet Take 10 mg by mouth daily.    . sildenafil (VIAGRA) 100 MG tablet Take 100 mg by mouth daily as needed.    . tadalafil (CIALIS) 5 MG tablet Take 5 mg by mouth daily as needed for erectile dysfunction.    . Vardenafil HCl 10 MG TBDP Take 1 tablet by mouth as needed. 30 tablet 1  . Vitamin D, Ergocalciferol, (DRISDOL) 50000 units CAPS  capsule Take 50,000 Units by mouth every 7 (seven) days.     No current facility-administered medications for this visit.     PHYSICAL EXAMINATION: ECOG PERFORMANCE STATUS: 0 - Asymptomatic  Vitals:   01/06/19 1114  BP: 137/81  Pulse: (!) 59  Resp: 18  Temp: (!) 88.1 F (31.2 C)  SpO2: 97%   Filed Weights   01/06/19 1114   Weight: 220 lb 12.8 oz (100.2 kg)    GENERAL:alert, no distress and comfortable SKIN: Noted support keratosis EYES: normal, Conjunctiva are pink and non-injected, sclera clear OROPHARYNX:no exudate, no erythema and lips, buccal mucosa, and tongue normal  NECK: supple, thyroid normal size, non-tender, without nodularity LYMPH:  no palpable lymphadenopathy in the cervical, axillary or inguinal LUNGS: clear to auscultation and percussion with normal breathing effort HEART: regular rate & rhythm and no murmurs and no lower extremity edema ABDOMEN:abdomen soft, non-tender and normal bowel sounds Musculoskeletal:no cyanosis of digits and no clubbing  NEURO: alert & oriented x 3 with fluent speech, no focal motor/sensory deficits  LABORATORY DATA:  I have reviewed the data as listed    Component Value Date/Time   NA 140 05/10/2018 1155   NA 136 06/24/2016 1014   K 3.4 (L) 05/10/2018 1155   K 3.9 06/24/2016 1014   CL 104 05/10/2018 1155   CL 105 10/06/2012 1047   CO2 28 05/10/2018 1155   CO2 24 06/24/2016 1014   GLUCOSE 141 (H) 05/10/2018 1155   GLUCOSE 104 06/24/2016 1014   GLUCOSE 80 10/06/2012 1047   BUN 18 05/10/2018 1155   BUN 17.8 06/24/2016 1014   CREATININE 0.94 05/10/2018 1155   CREATININE 0.9 06/24/2016 1014   CALCIUM 8.9 05/10/2018 1155   CALCIUM 9.2 06/24/2016 1014   PROT 6.5 05/10/2018 1155   PROT 6.5 06/24/2016 1014   ALBUMIN 3.9 05/10/2018 1155   ALBUMIN 3.7 06/24/2016 1014   AST 17 05/10/2018 1155   AST 16 06/24/2016 1014   ALT 19 05/10/2018 1155   ALT 17 06/24/2016 1014   ALKPHOS 42 05/10/2018 1155   ALKPHOS 55 06/24/2016 1014   BILITOT 0.7 05/10/2018 1155   BILITOT 0.51 06/24/2016 1014   GFRNONAA >60 05/10/2018 1155   GFRAA >60 05/10/2018 1155    No results found for: SPEP, UPEP  Lab Results  Component Value Date   WBC 10.7 (H) 01/06/2019   NEUTROABS 4.4 01/06/2019   HGB 14.8 01/06/2019   HCT 44.1 01/06/2019   MCV 91.5 01/06/2019   PLT 154  01/06/2019      Chemistry      Component Value Date/Time   NA 140 05/10/2018 1155   NA 136 06/24/2016 1014   K 3.4 (L) 05/10/2018 1155   K 3.9 06/24/2016 1014   CL 104 05/10/2018 1155   CL 105 10/06/2012 1047   CO2 28 05/10/2018 1155   CO2 24 06/24/2016 1014   BUN 18 05/10/2018 1155   BUN 17.8 06/24/2016 1014   CREATININE 0.94 05/10/2018 1155   CREATININE 0.9 06/24/2016 1014      Component Value Date/Time   CALCIUM 8.9 05/10/2018 1155   CALCIUM 9.2 06/24/2016 1014   ALKPHOS 42 05/10/2018 1155   ALKPHOS 55 06/24/2016 1014   AST 17 05/10/2018 1155   AST 16 06/24/2016 1014   ALT 19 05/10/2018 1155   ALT 17 06/24/2016 1014   BILITOT 0.7 05/10/2018 1155   BILITOT 0.51 06/24/2016 1014      All questions were answered. The patient knows to  call the clinic with any problems, questions or concerns. No barriers to learning was detected.  I spent 15 minutes counseling the patient face to face. The total time spent in the appointment was 20 minutes and more than 50% was on counseling and review of test results  Heath Lark, MD 01/06/2019 1:54 PM

## 2019-01-07 ENCOUNTER — Telehealth: Payer: Self-pay | Admitting: Hematology and Oncology

## 2019-01-07 NOTE — Telephone Encounter (Signed)
I could not reach patient regarding schedule  °

## 2019-07-08 ENCOUNTER — Ambulatory Visit: Payer: Medicare Other

## 2019-07-14 ENCOUNTER — Ambulatory Visit: Payer: Medicare Other | Attending: Internal Medicine

## 2019-07-14 DIAGNOSIS — Z23 Encounter for immunization: Secondary | ICD-10-CM | POA: Insufficient documentation

## 2019-07-14 NOTE — Progress Notes (Signed)
   Covid-19 Vaccination Clinic  Name:  Brian Bean    MRN: [id number] DOB: 1940-11-09  07/14/2019  [name] was observed post Covid-19 immunization for 15 minutes without incidence. He was provided with Vaccine Information Sheet and instruction to access the V-Safe system.   [name] was instructed to call 911 with any severe reactions post vaccine: Marland Kitchen Difficulty breathing  . Swelling of your face and throat  . A fast heartbeat  . A bad rash all over your body  . Dizziness and weakness    Immunizations Administered    Name Date Dose VIS Date Route   Pfizer COVID-19 Vaccine 07/14/2019 12:26 PM 0.3 mL 05/20/2019 Intramuscular   Manufacturer: [place]   Lot: [id number]   [place]: [id number]

## 2019-08-08 ENCOUNTER — Ambulatory Visit: Payer: Medicare Other | Attending: Internal Medicine

## 2019-08-08 DIAGNOSIS — Z23 Encounter for immunization: Secondary | ICD-10-CM | POA: Insufficient documentation

## 2019-08-08 NOTE — Progress Notes (Signed)
   Covid-19 Vaccination Clinic  Name:  Brian Bean    MRN: [id number] DOB: [birth year]  08/08/2019  Brian Bean was observed post Covid-19 immunization for 15 minutes without incidence. He was provided with Vaccine Information Sheet and instruction to access the V-Safe system.   Brian Bean was instructed to call 911 with any severe reactions post vaccine: Marland Kitchen Difficulty breathing  . Swelling of your face and throat  . A fast heartbeat  . A bad rash all over your body  . Dizziness and weakness    Immunizations Administered    Name Date Dose VIS Date Route   Pfizer COVID-19 Vaccine 08/08/2019  3:48 PM 0.3 mL 05/20/2019 Intramuscular   Manufacturer: [place]   Lot: [id number]   [place]: [address]-1

## 2019-11-24 ENCOUNTER — Telehealth: Payer: Self-pay | Admitting: Hematology and Oncology

## 2019-11-24 NOTE — Telephone Encounter (Signed)
Scheduled per 6/15 sch message. Unable to reach pt. Left voicemail- appts moved to 8/3. Provider on pal

## 2020-01-02 ENCOUNTER — Other Ambulatory Visit: Payer: Self-pay | Admitting: Sports Medicine

## 2020-01-02 DIAGNOSIS — M7551 Bursitis of right shoulder: Secondary | ICD-10-CM

## 2020-01-02 DIAGNOSIS — M75101 Unspecified rotator cuff tear or rupture of right shoulder, not specified as traumatic: Secondary | ICD-10-CM

## 2020-01-02 DIAGNOSIS — M25511 Pain in right shoulder: Secondary | ICD-10-CM

## 2020-01-02 DIAGNOSIS — M7541 Impingement syndrome of right shoulder: Secondary | ICD-10-CM

## 2020-01-06 ENCOUNTER — Ambulatory Visit: Payer: Medicare Other | Admitting: Hematology and Oncology

## 2020-01-06 ENCOUNTER — Other Ambulatory Visit: Payer: Medicare Other

## 2020-01-10 ENCOUNTER — Encounter: Payer: Self-pay | Admitting: Hematology and Oncology

## 2020-01-10 ENCOUNTER — Other Ambulatory Visit: Payer: Self-pay

## 2020-01-10 ENCOUNTER — Inpatient Hospital Stay: Payer: Medicare Other | Attending: Hematology and Oncology

## 2020-01-10 ENCOUNTER — Inpatient Hospital Stay: Payer: Medicare Other | Admitting: Hematology and Oncology

## 2020-01-10 DIAGNOSIS — E669 Obesity, unspecified: Secondary | ICD-10-CM | POA: Diagnosis not present

## 2020-01-10 DIAGNOSIS — Z85828 Personal history of other malignant neoplasm of skin: Secondary | ICD-10-CM

## 2020-01-10 DIAGNOSIS — Z6830 Body mass index (BMI) 30.0-30.9, adult: Secondary | ICD-10-CM | POA: Diagnosis not present

## 2020-01-10 DIAGNOSIS — C9110 Chronic lymphocytic leukemia of B-cell type not having achieved remission: Secondary | ICD-10-CM | POA: Insufficient documentation

## 2020-01-10 DIAGNOSIS — C61 Malignant neoplasm of prostate: Secondary | ICD-10-CM | POA: Insufficient documentation

## 2020-01-10 LAB — CBC WITH DIFFERENTIAL/PLATELET
Abs Immature Granulocytes: 0.02 10*3/uL (ref 0.00–0.07)
Basophils Absolute: 0.1 10*3/uL (ref 0.0–0.1)
Basophils Relative: 1 %
Eosinophils Absolute: 0.1 10*3/uL (ref 0.0–0.5)
Eosinophils Relative: 1 %
HCT: 43.7 % (ref 39.0–52.0)
Hemoglobin: 14.7 g/dL (ref 13.0–17.0)
Immature Granulocytes: 0 %
Lymphocytes Relative: 55 %
Lymphs Abs: 5.9 10*3/uL — ABNORMAL HIGH (ref 0.7–4.0)
MCH: 30.4 pg (ref 26.0–34.0)
MCHC: 33.6 g/dL (ref 30.0–36.0)
MCV: 90.5 fL (ref 80.0–100.0)
Monocytes Absolute: 0.5 10*3/uL (ref 0.1–1.0)
Monocytes Relative: 5 %
Neutro Abs: 4 10*3/uL (ref 1.7–7.7)
Neutrophils Relative %: 38 %
Platelets: 141 10*3/uL — ABNORMAL LOW (ref 150–400)
RBC: 4.83 MIL/uL (ref 4.22–5.81)
RDW: 13.7 % (ref 11.5–15.5)
WBC: 10.6 10*3/uL — ABNORMAL HIGH (ref 4.0–10.5)
nRBC: 0 % (ref 0.0–0.2)

## 2020-01-10 NOTE — Assessment & Plan Note (Signed)
We had extensive discussions about this and he is motivated to lose weight We discussed the association of obesity with multiple different malignancies I gave him some ideas and strategies to lose weight

## 2020-01-10 NOTE — Progress Notes (Signed)
[place] OFFICE PROGRESS NOTE  Patient Care Team: Lajean Manes, MD as PCP - General (Internal Medicine) Frederik Pear, MD as Attending Physician (Orthopedic Surgery) Heath Lark, MD as Consulting Physician (Hematology and Oncology)  ASSESSMENT & PLAN:  CLL (chronic lymphocytic leukemia) He has recurrent lymphocytosis again Clinically, he has no signs or symptoms to suggest cancer progression I will see him once a year  History of skin cancer He has appointment to see dermatologist. He had multiple cryotherapy for squamous cell carcinoma and basal cell carcinoma We discussed the importance of avoidance of excessive sun exposure due to risk of skin cancer with CLL diagnosis  Prostate cancer [facility]) His recent PSA is stable Currently, he is not symptomatic I would defer to urologist for further management  Obesity (BMI 30.0-34.9) We had extensive discussions about this and he is motivated to lose weight We discussed the association of obesity with multiple different malignancies I gave him some ideas and strategies to lose weight   No orders of the defined types were placed in this encounter.   All questions were answered. The patient knows to call the clinic with any problems, questions or concerns. The total time spent in the appointment was 20 minutes encounter with patients including review of chart and various tests results, discussions about plan of care and coordination of care plan   Heath Lark, MD 01/10/2020 5:19 PM  INTERVAL HISTORY: Please see below for problem oriented charting. He returns for further follow-up He is feeling well No recent infection, fever or chills No new lymphadenopathy According to the patient, he has close follow-up with dermatologist for skin cancer His recent PSA is stable. The patient had completed treatment for prostate cancer  SUMMARY OF ONCOLOGIC HISTORY: Oncology History  CLL (chronic lymphocytic leukemia) ([place])   07/08/2012 Pathology Results   Accession #: [id number]  peripheral blood comfirmed CLL   06/24/2016 Pathology Results   CLL FISH showed 3.67% positive for deletion 13 q and 5% positive for p53 deletion     REVIEW OF SYSTEMS:   Constitutional: Denies fevers, chills or abnormal weight loss Eyes: Denies blurriness of vision Ears, nose, mouth, throat, and face: Denies mucositis or sore throat Respiratory: Denies cough, dyspnea or wheezes Cardiovascular: Denies palpitation, chest discomfort or lower extremity swelling Gastrointestinal:  Denies nausea, heartburn or change in bowel habits Skin: Denies abnormal skin rashes Lymphatics: Denies new lymphadenopathy or easy bruising Neurological:Denies numbness, tingling or new weaknesses Behavioral/Psych: Mood is stable, no new changes  All other systems were reviewed with the patient and are negative.  I have reviewed the past medical history, past surgical history, social history and family history with the patient and they are unchanged from previous note.  ALLERGIES:  has No Known Allergies.  MEDICATIONS:  Current Outpatient Medications  Medication Sig Dispense Refill  . Cholecalciferol (VITAMIN D) 125 MCG (5000 UT) CAPS Take 5,000 mg by mouth daily.    Marland Kitchen amLODipine (NORVASC) 5 MG tablet Take 5 mg by mouth 2 (two) times daily.     . carvedilol (COREG) 25 MG tablet Take 12.5 mg by mouth 2 (two) times daily with a meal. Takes 12.5 mg daily    . FLUoxetine (PROZAC) 40 MG capsule Take 40 mg by mouth daily as needed.     . hydrochlorothiazide (HYDRODIURIL) 25 MG tablet Take 25 mg by mouth.    Marland Kitchen lisinopril (PRINIVIL,ZESTRIL) 10 MG tablet Take 10 mg by mouth daily.    . pravastatin (PRAVACHOL) 10 MG tablet  Take 10 mg by mouth daily.    . sildenafil (VIAGRA) 100 MG tablet Take 100 mg by mouth daily as needed.    . tadalafil (CIALIS) 5 MG tablet Take 5 mg by mouth daily as needed for erectile dysfunction.    . Vardenafil HCl 10 MG TBDP Take 1  tablet by mouth as needed. 30 tablet 1   No current facility-administered medications for this visit.    PHYSICAL EXAMINATION: ECOG PERFORMANCE STATUS: 0 - Asymptomatic  Vitals:   01/10/20 1009  BP: 139/72  Pulse: 62  Resp: 18  Temp: 98.2 F (36.8 C)  SpO2: 97%   Filed Weights   01/10/20 1009  Weight: 219 lb (99.3 kg)    GENERAL:alert, no distress and comfortable NEURO: alert & oriented x 3 with fluent speech, no focal motor/sensory deficits  LABORATORY DATA:  I have reviewed the data as listed    Component Value Date/Time   NA 140 05/10/2018 1155   NA 136 06/24/2016 1014   K 3.4 (L) 05/10/2018 1155   K 3.9 06/24/2016 1014   CL 104 05/10/2018 1155   CL 105 10/06/2012 1047   CO2 28 05/10/2018 1155   CO2 24 06/24/2016 1014   GLUCOSE 141 (H) 05/10/2018 1155   GLUCOSE 104 06/24/2016 1014   GLUCOSE 80 10/06/2012 1047   BUN 18 05/10/2018 1155   BUN 17.8 06/24/2016 1014   CREATININE 0.94 05/10/2018 1155   CREATININE 0.9 06/24/2016 1014   CALCIUM 8.9 05/10/2018 1155   CALCIUM 9.2 06/24/2016 1014   PROT 6.5 05/10/2018 1155   PROT 6.5 06/24/2016 1014   ALBUMIN 3.9 05/10/2018 1155   ALBUMIN 3.7 06/24/2016 1014   AST 17 05/10/2018 1155   AST 16 06/24/2016 1014   ALT 19 05/10/2018 1155   ALT 17 06/24/2016 1014   ALKPHOS 42 05/10/2018 1155   ALKPHOS 55 06/24/2016 1014   BILITOT 0.7 05/10/2018 1155   BILITOT 0.51 06/24/2016 1014   GFRNONAA >60 05/10/2018 1155   GFRAA >60 05/10/2018 1155    No results found for: SPEP, UPEP  Lab Results  Component Value Date   WBC 10.6 (H) 01/10/2020   NEUTROABS 4.0 01/10/2020   HGB 14.7 01/10/2020   HCT 43.7 01/10/2020   MCV 90.5 01/10/2020   PLT 141 (L) 01/10/2020      Chemistry      Component Value Date/Time   NA 140 05/10/2018 1155   NA 136 06/24/2016 1014   K 3.4 (L) 05/10/2018 1155   K 3.9 06/24/2016 1014   CL 104 05/10/2018 1155   CL 105 10/06/2012 1047   CO2 28 05/10/2018 1155   CO2 24 06/24/2016 1014   BUN  18 05/10/2018 1155   BUN 17.8 06/24/2016 1014   CREATININE 0.94 05/10/2018 1155   CREATININE 0.9 06/24/2016 1014      Component Value Date/Time   CALCIUM 8.9 05/10/2018 1155   CALCIUM 9.2 06/24/2016 1014   ALKPHOS 42 05/10/2018 1155   ALKPHOS 55 06/24/2016 1014   AST 17 05/10/2018 1155   AST 16 06/24/2016 1014   ALT 19 05/10/2018 1155   ALT 17 06/24/2016 1014   BILITOT 0.7 05/10/2018 1155   BILITOT 0.51 06/24/2016 1014

## 2020-01-10 NOTE — Assessment & Plan Note (Signed)
His recent PSA is stable Currently, he is not symptomatic I would defer to urologist for further management

## 2020-01-10 NOTE — Assessment & Plan Note (Signed)
He has recurrent lymphocytosis again Clinically, he has no signs or symptoms to suggest cancer progression I will see him once a year

## 2020-01-10 NOTE — Assessment & Plan Note (Signed)
He has appointment to see dermatologist. He had multiple cryotherapy for squamous cell carcinoma and basal cell carcinoma We discussed the importance of avoidance of excessive sun exposure due to risk of skin cancer with CLL diagnosis

## 2020-01-12 ENCOUNTER — Telehealth: Payer: Self-pay | Admitting: Hematology and Oncology

## 2020-01-12 NOTE — Telephone Encounter (Signed)
Scheduled per 8/3 sch msg. Called and spoke with pt sgo, confirmed 8/4 appts

## 2020-01-18 ENCOUNTER — Ambulatory Visit
Admission: RE | Admit: 2020-01-18 | Discharge: 2020-01-18 | Disposition: A | Discharge status: Home / Self Care | Payer: Medicare Other | Source: Ambulatory Visit | Attending: Sports Medicine | Admitting: Sports Medicine

## 2020-01-18 ENCOUNTER — Other Ambulatory Visit: Payer: Self-pay

## 2020-01-18 DIAGNOSIS — M25511 Pain in right shoulder: Secondary | ICD-10-CM | POA: Diagnosis present

## 2020-01-18 DIAGNOSIS — M7541 Impingement syndrome of right shoulder: Secondary | ICD-10-CM | POA: Diagnosis present

## 2020-01-18 DIAGNOSIS — M7551 Bursitis of right shoulder: Secondary | ICD-10-CM

## 2020-01-18 DIAGNOSIS — M75101 Unspecified rotator cuff tear or rupture of right shoulder, not specified as traumatic: Secondary | ICD-10-CM | POA: Insufficient documentation

## 2020-03-09 ENCOUNTER — Other Ambulatory Visit: Payer: Self-pay | Admitting: Internal Medicine

## 2020-03-09 ENCOUNTER — Ambulatory Visit: Payer: Medicare Other | Attending: Internal Medicine

## 2020-03-09 DIAGNOSIS — Z23 Encounter for immunization: Secondary | ICD-10-CM

## 2020-03-09 NOTE — Progress Notes (Signed)
   Covid-19 Vaccination Clinic  Name:  Brian Bean    MRN: [id number] DOB: [birth year]  03/09/2020  Brian Bean was observed post Covid-19 immunization for 15 minutes without incident. He was provided with Vaccine Information Sheet and instruction to access the V-Safe system.   Brian Bean was instructed to call 911 with any severe reactions post vaccine: Marland Kitchen Difficulty breathing  . Swelling of face and throat  . A fast heartbeat  . A bad rash all over body  . Dizziness and weakness

## 2020-04-11 ENCOUNTER — Other Ambulatory Visit: Payer: Self-pay | Admitting: Surgery

## 2020-04-23 ENCOUNTER — Other Ambulatory Visit: Payer: Self-pay

## 2020-04-23 ENCOUNTER — Other Ambulatory Visit: Payer: Medicare Other

## 2020-04-23 ENCOUNTER — Encounter
Admission: RE | Admit: 2020-04-23 | Discharge: 2020-04-23 | Disposition: A | Discharge status: Home / Self Care | Payer: Medicare Other | Source: Ambulatory Visit | Attending: Surgery | Admitting: Surgery

## 2020-04-23 DIAGNOSIS — I10 Essential (primary) hypertension: Secondary | ICD-10-CM | POA: Insufficient documentation

## 2020-04-23 DIAGNOSIS — Z01818 Encounter for other preprocedural examination: Secondary | ICD-10-CM | POA: Insufficient documentation

## 2020-04-23 LAB — CBC
HCT: 44.5 % (ref 39.0–52.0)
Hemoglobin: 15.1 g/dL (ref 13.0–17.0)
MCH: 31 pg (ref 26.0–34.0)
MCHC: 33.9 g/dL (ref 30.0–36.0)
MCV: 91.4 fL (ref 80.0–100.0)
Platelets: 157 10*3/uL (ref 150–400)
RBC: 4.87 MIL/uL (ref 4.22–5.81)
RDW: 13.7 % (ref 11.5–15.5)
WBC: 11 10*3/uL — ABNORMAL HIGH (ref 4.0–10.5)
nRBC: 0 % (ref 0.0–0.2)

## 2020-04-23 LAB — BASIC METABOLIC PANEL
Anion gap: 11 (ref 5–15)
BUN: 19 mg/dL (ref 8–23)
CO2: 26 mmol/L (ref 22–32)
Calcium: 9.2 mg/dL (ref 8.9–10.3)
Chloride: 101 mmol/L (ref 98–111)
Creatinine, Ser: 0.82 mg/dL (ref 0.61–1.24)
GFR, Estimated: >60 mL/min (ref >60)
Glucose, Bld: 103 mg/dL — ABNORMAL HIGH (ref 70–99)
Potassium: 3.7 mmol/L (ref 3.5–5.1)
Sodium: 138 mmol/L (ref 135–145)

## 2020-04-23 LAB — SURGICAL PCR SCREEN
MRSA, PCR: NEGATIVE
Staphylococcus aureus: POSITIVE — AB

## 2020-04-23 NOTE — Patient Instructions (Signed)
Your procedure is scheduled on: May 01, 2020 TUESDAY Report to the Registration Desk on the 1st floor of the [facility]. To find out your arrival time, please call [phone] between 1PM - 3PM on: April 30, 2020  REMEMBER: Instructions that are not followed completely may result in serious medical risk, up to and including death; or upon the discretion of your surgeon and anesthesiologist your surgery may need to be rescheduled.  Do not eat food after midnight the night before surgery.  No gum chewing, lozengers or hard candies.  You may however, drink CLEAR liquids up to 2 hours before you are scheduled to arrive for your surgery. Do not drink anything within 2 hours of your scheduled arrival time.  Clear liquids include: - water  - apple juice without pulp - gatorade (not RED, PURPLE, OR BLUE) - black coffee or tea (Do NOT add milk or creamers to the coffee or tea) Do NOT drink anything that is not on this list.  Type 1 and Type 2 diabetics should only drink water.  In addition, your doctor has ordered for you to drink the provided  Ensure Pre-Surgery Clear Carbohydrate Drink  Drinking this carbohydrate drink up to two hours before surgery helps to reduce insulin resistance and improve patient outcomes. Please complete drinking 2 hours prior to scheduled arrival time.  TAKE THESE MEDICATIONS THE MORNING OF SURGERY WITH A SIP OF WATER: NORVASC COREG PROZAC  One week prior to surgery: Stop Anti-inflammatories (NSAIDS) such as Advil, Aleve, Ibuprofen, Motrin, Naproxen, Naprosyn and ASPIRIN OR Aspirin based products such as Excedrin, Goodys Powder, BC Powder. YOU MAY USE TYLENOL Stop ANY OVER THE COUNTER supplements until after surgery. (However, you may continue taking Vitamin D, Vitamin B, and multivitamin up until the day before surgery.)  No Alcohol for 24 hours before or after surgery.  No Smoking including e-cigarettes for 24 hours prior to surgery.  No  chewable tobacco products for at least 6 hours prior to surgery.  No nicotine patches on the day of surgery.  Do not use any "recreational" drugs for at least a week prior to your surgery.  Please be advised that the combination of cocaine and anesthesia may have negative outcomes, up to and including death. If you test positive for cocaine, your surgery will be cancelled.  On the morning of surgery brush your teeth with toothpaste and water, you may rinse your mouth with mouthwash if you wish. Do not swallow any toothpaste or mouthwash.  Do not wear jewelry, make-up, hairpins, clips or nail polish.  Do not wear lotions, powders, or perfumes.   Do not shave body from the neck down 48 hours prior to surgery just in case you cut yourself which could leave a site for infection.  Also, freshly shaved skin may become irritated if using the CHG soap.  Contact lenses, hearing aids and dentures may not be worn into surgery.  Do not bring valuables to the hospital. [facility] is not responsible for any missing/lost belongings or valuables.   Use CHG Soap as directed on instruction sheet.  Total Shoulder Arthroplasty:  use Benzolyl Peroxide 5% Gel as directed on instruction sheet.  Notify your doctor if there is any change in your medical condition (cold, fever, infection).  Wear comfortable clothing (specific to your surgery type) to the hospital.  Plan for stool softeners for home use; pain medications have a tendency to cause constipation. You can also help prevent constipation by eating foods high in  fiber such as fruits and vegetables and drinking plenty of fluids as your diet allows.  After surgery, you can help prevent lung complications by doing breathing exercises.  Take deep breaths and cough every 1-2 hours. Your doctor may order a device called an Incentive Spirometer to help you take deep breaths. When coughing or sneezing, hold a pillow firmly against your incision with both  hands. This is called "splinting." Doing this helps protect your incision. It also decreases belly discomfort.  If you are being admitted to the hospital overnight, YOU  MAY [place]. After surgery it may be brought to your room.  If you are being discharged the day of surgery, you will not be allowed to drive home. You will need a responsible adult (18 years or older) to drive you home and stay with you that night.   Please call the [place] Dept. at [phone] if you have any questions about these instructions.  Visitation Policy:  Patients undergoing a surgery or procedure may have one family member or support person with them as long as that person is not COVID-19 positive or experiencing its symptoms.  That person may remain in the waiting area during the procedure.  Inpatient Visitation Update:   In an effort to ensure the safety of our team members and our patients, we are implementing a change to our visitation policy:  Effective Monday, Aug. 9, at 7 a.m., inpatients will be allowed one support person.  o The support person may change daily.  o The support person must pass our screening, gel in and out, and wear a mask at all times, including in the patient's room.  o Patients must also wear a mask when staff or their support person are in the room.  o Masking is required regardless of vaccination status.  Systemwide, no visitors 17 or younger.

## 2020-04-23 NOTE — Progress Notes (Signed)
  [facility] Perioperative Services: Pre-Admission/Anesthesia Testing  Abnormal Lab Notification  Date: 04/23/20  Name: Brian Bean MRN:   [id number]  Re: Abnormal labs noted during PAT appointment  Provider Notified: Corky Mull, MD Notification mode: Routed and/or faxed via [facility] of concern: Lab Results  Component Value Date   STAPHAUREUS POSITIVE (A) 04/23/2020   MRSAPCR NEGATIVE 04/23/2020    Notes: Patient is scheduled for a REVERSE SHOULDER ARTHROPLASTY (Right Shoulder) on 05/01/2020. This is a Community education officer; no formal response required.   Honor Loh, MSN, APRN, FNP-C, CEN [facility]  Peri-operative Services Nurse Practitioner Phone: [phone] 04/23/20 5:31 PM

## 2020-04-27 ENCOUNTER — Other Ambulatory Visit
Admission: RE | Admit: 2020-04-27 | Discharge: 2020-04-27 | Disposition: A | Discharge status: Home / Self Care | Payer: Medicare Other | Source: Ambulatory Visit | Attending: Surgery | Admitting: Surgery

## 2020-04-27 ENCOUNTER — Other Ambulatory Visit: Payer: Self-pay

## 2020-04-27 DIAGNOSIS — Z01812 Encounter for preprocedural laboratory examination: Secondary | ICD-10-CM | POA: Insufficient documentation

## 2020-04-27 DIAGNOSIS — Z20822 Contact with and (suspected) exposure to covid-19: Secondary | ICD-10-CM | POA: Insufficient documentation

## 2020-04-27 LAB — SARS CORONAVIRUS 2 (TAT 6-24 HRS): SARS Coronavirus 2: NEGATIVE

## 2020-05-01 ENCOUNTER — Inpatient Hospital Stay: Payer: Medicare Other

## 2020-05-01 ENCOUNTER — Inpatient Hospital Stay: Payer: Medicare Other | Admitting: Certified Registered Nurse Anesthetist

## 2020-05-01 ENCOUNTER — Encounter: Payer: Self-pay | Admitting: Surgery

## 2020-05-01 ENCOUNTER — Encounter
Admission: RE | Disposition: A | Discharge status: Home / Self Care | Payer: Self-pay | Source: Home / Self Care | Attending: Surgery

## 2020-05-01 ENCOUNTER — Ambulatory Visit
Admission: RE | Admit: 2020-05-01 | Discharge: 2020-05-01 | Disposition: A | Discharge status: Home / Self Care | Payer: Medicare Other | Attending: Surgery | Admitting: Surgery

## 2020-05-01 ENCOUNTER — Other Ambulatory Visit: Payer: Self-pay

## 2020-05-01 DIAGNOSIS — S46011A Strain of muscle(s) and tendon(s) of the rotator cuff of right shoulder, initial encounter: Secondary | ICD-10-CM | POA: Insufficient documentation

## 2020-05-01 DIAGNOSIS — Z79899 Other long term (current) drug therapy: Secondary | ICD-10-CM | POA: Diagnosis not present

## 2020-05-01 DIAGNOSIS — Z419 Encounter for procedure for purposes other than remedying health state, unspecified: Secondary | ICD-10-CM

## 2020-05-01 DIAGNOSIS — M19011 Primary osteoarthritis, right shoulder: Secondary | ICD-10-CM | POA: Diagnosis not present

## 2020-05-01 DIAGNOSIS — W19XXXA Unspecified fall, initial encounter: Secondary | ICD-10-CM | POA: Insufficient documentation

## 2020-05-01 DIAGNOSIS — M25811 Other specified joint disorders, right shoulder: Secondary | ICD-10-CM | POA: Insufficient documentation

## 2020-05-01 DIAGNOSIS — C9110 Chronic lymphocytic leukemia of B-cell type not having achieved remission: Secondary | ICD-10-CM | POA: Diagnosis not present

## 2020-05-01 DIAGNOSIS — M7591 Shoulder lesion, unspecified, right shoulder: Secondary | ICD-10-CM | POA: Insufficient documentation

## 2020-05-01 DIAGNOSIS — Z96611 Presence of right artificial shoulder joint: Secondary | ICD-10-CM

## 2020-05-01 HISTORY — PX: REVERSE SHOULDER ARTHROPLASTY: SHX5054

## 2020-05-01 LAB — TYPE AND SCREEN
ABO/RH(D): AB POS
Antibody Screen: NEGATIVE

## 2020-05-01 SURGERY — ARTHROPLASTY, SHOULDER, TOTAL, REVERSE
Anesthesia: General | Site: Shoulder | Laterality: Right

## 2020-05-01 MED ORDER — PROPOFOL 10 MG/ML IV BOLUS
INTRAVENOUS | Status: AC
  Filled 2020-05-01: qty 20

## 2020-05-01 MED ORDER — BUPIVACAINE HCL (PF) 0.5 % IJ SOLN
INTRAMUSCULAR | Status: DC | PRN
  Administered 2020-05-01: 5 mL via PERINEURAL

## 2020-05-01 MED ORDER — BUPIVACAINE LIPOSOME 1.3 % IJ SUSP
INTRAMUSCULAR | Status: DC | PRN
  Administered 2020-05-01: 20 mL via PERINEURAL

## 2020-05-01 MED ORDER — PROPOFOL 500 MG/50ML IV EMUL
INTRAVENOUS | Status: AC
  Filled 2020-05-01: qty 50

## 2020-05-01 MED ORDER — ACETAMINOPHEN 10 MG/ML IV SOLN
INTRAVENOUS | Status: AC
  Filled 2020-05-01: qty 100

## 2020-05-01 MED ORDER — OXYCODONE HCL 5 MG PO TABS
5.0000 mg | ORAL_TABLET | ORAL | 0 refills | Status: DC | PRN
Start: 2020-05-01 — End: 2021-01-10

## 2020-05-01 MED ORDER — MIDAZOLAM HCL 2 MG/2ML IJ SOLN: 1.0000 mg | INTRAMUSCULAR | Status: DC | PRN

## 2020-05-01 MED ORDER — CEFAZOLIN SODIUM-DEXTROSE 2-4 GM/100ML-% IV SOLN
2.0000 g | Freq: Four times a day (QID) | INTRAVENOUS | Status: DC
  Administered 2020-05-01: 2 g via INTRAVENOUS

## 2020-05-01 MED ORDER — CEFAZOLIN SODIUM-DEXTROSE 2-4 GM/100ML-% IV SOLN
INTRAVENOUS | Status: AC
  Filled 2020-05-01: qty 100

## 2020-05-01 MED ORDER — SUGAMMADEX SODIUM 200 MG/2ML IV SOLN
INTRAVENOUS | Status: DC | PRN
  Administered 2020-05-01: 200 mg via INTRAVENOUS

## 2020-05-01 MED ORDER — ORAL CARE MOUTH RINSE: 15.0000 mL | Freq: Once | OROMUCOSAL | Status: AC

## 2020-05-01 MED ORDER — CEFAZOLIN SODIUM-DEXTROSE 2-4 GM/100ML-% IV SOLN
2.0000 g | INTRAVENOUS | Status: AC
  Administered 2020-05-01: 2 g via INTRAVENOUS

## 2020-05-01 MED ORDER — BUPIVACAINE HCL (PF) 0.5 % IJ SOLN
INTRAMUSCULAR | Status: AC
  Filled 2020-05-01: qty 10

## 2020-05-01 MED ORDER — METOCLOPRAMIDE HCL 5 MG/ML IJ SOLN: 5.0000 mg | Freq: Three times a day (TID) | INTRAMUSCULAR | Status: DC | PRN

## 2020-05-01 MED ORDER — MIDAZOLAM HCL 2 MG/2ML IJ SOLN
INTRAMUSCULAR | Status: AC
  Administered 2020-05-01: 0.5 mg via INTRAVENOUS
  Filled 2020-05-01: qty 2

## 2020-05-01 MED ORDER — FAMOTIDINE 20 MG PO TABS: 20.0000 mg | ORAL_TABLET | Freq: Once | ORAL | Status: AC

## 2020-05-01 MED ORDER — LABETALOL HCL 5 MG/ML IV SOLN
INTRAVENOUS | Status: AC
  Filled 2020-05-01: qty 4

## 2020-05-01 MED ORDER — ROCURONIUM BROMIDE 100 MG/10ML IV SOLN
INTRAVENOUS | Status: DC | PRN
  Administered 2020-05-01: 20 mg via INTRAVENOUS
  Administered 2020-05-01: 50 mg via INTRAVENOUS

## 2020-05-01 MED ORDER — KETOROLAC TROMETHAMINE 15 MG/ML IJ SOLN: 15.0000 mg | Freq: Once | INTRAMUSCULAR | Status: AC

## 2020-05-01 MED ORDER — LIDOCAINE HCL (PF) 1 % IJ SOLN
INTRAMUSCULAR | Status: DC | PRN
  Administered 2020-05-01: 3 mL

## 2020-05-01 MED ORDER — LACTATED RINGERS IV SOLN: INTRAVENOUS | Status: DC

## 2020-05-01 MED ORDER — METOCLOPRAMIDE HCL 10 MG PO TABS: 5.0000 mg | ORAL_TABLET | Freq: Three times a day (TID) | ORAL | Status: DC | PRN

## 2020-05-01 MED ORDER — FENTANYL CITRATE (PF) 100 MCG/2ML IJ SOLN
INTRAMUSCULAR | Status: AC
  Administered 2020-05-01: 50 ug via INTRAVENOUS
  Filled 2020-05-01: qty 2

## 2020-05-01 MED ORDER — BUPIVACAINE-EPINEPHRINE (PF) 0.5% -1:200000 IJ SOLN
INTRAMUSCULAR | Status: DC | PRN
  Administered 2020-05-01: 30 mL

## 2020-05-01 MED ORDER — FENTANYL CITRATE (PF) 100 MCG/2ML IJ SOLN: 50.0000 ug | INTRAMUSCULAR | Status: DC | PRN

## 2020-05-01 MED ORDER — OXYCODONE HCL 5 MG PO TABS: 5.0000 mg | ORAL_TABLET | ORAL | Status: DC | PRN

## 2020-05-01 MED ORDER — SODIUM CHLORIDE 0.9 % IV SOLN
INTRAVENOUS | Status: DC | PRN
  Administered 2020-05-01: 10 ug/min via INTRAVENOUS

## 2020-05-01 MED ORDER — ONDANSETRON HCL 4 MG/2ML IJ SOLN: 4.0000 mg | Freq: Four times a day (QID) | INTRAMUSCULAR | Status: DC | PRN

## 2020-05-01 MED ORDER — LIDOCAINE HCL (PF) 1 % IJ SOLN
INTRAMUSCULAR | Status: AC
  Filled 2020-05-01: qty 5

## 2020-05-01 MED ORDER — LIDOCAINE HCL (CARDIAC) PF 100 MG/5ML IV SOSY
PREFILLED_SYRINGE | INTRAVENOUS | Status: DC | PRN
  Administered 2020-05-01: 80 mg via INTRAVENOUS

## 2020-05-01 MED ORDER — KETOROLAC TROMETHAMINE 15 MG/ML IJ SOLN
INTRAMUSCULAR | Status: AC
  Administered 2020-05-01: 15 mg via INTRAVENOUS
  Filled 2020-05-01: qty 1

## 2020-05-01 MED ORDER — CHLORHEXIDINE GLUCONATE 0.12 % MT SOLN: 15.0000 mL | Freq: Once | OROMUCOSAL | Status: AC

## 2020-05-01 MED ORDER — ACETAMINOPHEN 10 MG/ML IV SOLN
INTRAVENOUS | Status: DC | PRN
  Administered 2020-05-01: 1000 mg via INTRAVENOUS

## 2020-05-01 MED ORDER — PROPOFOL 10 MG/ML IV BOLUS
INTRAVENOUS | Status: DC | PRN
  Administered 2020-05-01: 50 mg via INTRAVENOUS
  Administered 2020-05-01: 150 mg via INTRAVENOUS

## 2020-05-01 MED ORDER — LABETALOL HCL 5 MG/ML IV SOLN
INTRAVENOUS | Status: DC | PRN
  Administered 2020-05-01: 5 mg via INTRAVENOUS

## 2020-05-01 MED ORDER — PROPOFOL 10 MG/ML IV BOLUS
INTRAVENOUS | Status: AC
  Filled 2020-05-01: qty 40

## 2020-05-01 MED ORDER — ONDANSETRON HCL 4 MG/2ML IJ SOLN
INTRAMUSCULAR | Status: DC | PRN
  Administered 2020-05-01: 4 mg via INTRAVENOUS

## 2020-05-01 MED ORDER — EPHEDRINE SULFATE 50 MG/ML IJ SOLN
INTRAMUSCULAR | Status: DC | PRN
  Administered 2020-05-01 (×2): 5 mg via INTRAVENOUS
  Administered 2020-05-01: 10 mg via INTRAVENOUS

## 2020-05-01 MED ORDER — CHLORHEXIDINE GLUCONATE 0.12 % MT SOLN
OROMUCOSAL | Status: AC
  Administered 2020-05-01: 15 mL via OROMUCOSAL
  Filled 2020-05-01: qty 15

## 2020-05-01 MED ORDER — FENTANYL CITRATE (PF) 100 MCG/2ML IJ SOLN
INTRAMUSCULAR | Status: DC | PRN
  Administered 2020-05-01 (×2): 50 ug via INTRAVENOUS

## 2020-05-01 MED ORDER — TRANEXAMIC ACID 1000 MG/10ML IV SOLN
INTRAVENOUS | Status: DC | PRN
  Administered 2020-05-01: 1000 mg via TOPICAL

## 2020-05-01 MED ORDER — FENTANYL CITRATE (PF) 100 MCG/2ML IJ SOLN
INTRAMUSCULAR | Status: AC
  Filled 2020-05-01: qty 2

## 2020-05-01 MED ORDER — PROPOFOL 500 MG/50ML IV EMUL
INTRAVENOUS | Status: DC | PRN
  Administered 2020-05-01: 150 ug/kg/min via INTRAVENOUS

## 2020-05-01 MED ORDER — FAMOTIDINE 20 MG PO TABS
ORAL_TABLET | ORAL | Status: AC
  Administered 2020-05-01: 20 mg via ORAL
  Filled 2020-05-01: qty 1

## 2020-05-01 MED ORDER — FENTANYL CITRATE (PF) 100 MCG/2ML IJ SOLN: 25.0000 ug | INTRAMUSCULAR | Status: DC | PRN

## 2020-05-01 MED ORDER — BUPIVACAINE LIPOSOME 1.3 % IJ SUSP
INTRAMUSCULAR | Status: AC
  Filled 2020-05-01: qty 20

## 2020-05-01 MED ORDER — GLYCOPYRROLATE 0.2 MG/ML IJ SOLN
INTRAMUSCULAR | Status: DC | PRN
  Administered 2020-05-01: .2 mg via INTRAVENOUS

## 2020-05-01 MED ORDER — ONDANSETRON HCL 4 MG PO TABS: 4.0000 mg | ORAL_TABLET | Freq: Four times a day (QID) | ORAL | Status: DC | PRN

## 2020-05-01 MED ORDER — DEXAMETHASONE SODIUM PHOSPHATE 10 MG/ML IJ SOLN
INTRAMUSCULAR | Status: DC | PRN
  Administered 2020-05-01: 10 mg via INTRAVENOUS

## 2020-05-01 MED ORDER — ACETAMINOPHEN 500 MG PO TABS: 1000.0000 mg | ORAL_TABLET | Freq: Four times a day (QID) | ORAL | Status: DC

## 2020-05-01 MED ORDER — DEXAMETHASONE SODIUM PHOSPHATE 10 MG/ML IJ SOLN
INTRAMUSCULAR | Status: AC
  Filled 2020-05-01: qty 1

## 2020-05-01 MED ORDER — ONDANSETRON HCL 4 MG/2ML IJ SOLN: 4.0000 mg | Freq: Once | INTRAMUSCULAR | Status: DC | PRN

## 2020-05-01 SURGICAL SUPPLY — 76 items
APL PRP STRL LF DISP 70% ISPRP (MISCELLANEOUS) ×1
BASEPLATE GLENOSPHERE 25 (Plate) ×2 IMPLANT
BASEPLATE GLENOSPHERE 25MM (Plate) ×1 IMPLANT
BEARING HUMERAL 40 STD VITE (Joint) ×3 IMPLANT
BIT DRILL F/CENTRAL SCRW 3.2 (BIT) ×1
BIT DRILL F/CENTRAL SCRW 3.2MM (BIT) ×1 IMPLANT
BIT DRILL TWIST 2.7 (BIT) ×2 IMPLANT
BIT DRILL TWIST 2.7MM (BIT) ×1
BLADE SAW SAG 25X90X1.19 (BLADE) ×3 IMPLANT
BRNG HUM STD 40 RVRS SHLDR (Joint) ×1 IMPLANT
CANISTER SUCT 1200ML W/VALVE (MISCELLANEOUS) ×3 IMPLANT
CANISTER SUCT 3000ML PPV (MISCELLANEOUS) ×6 IMPLANT
CHLORAPREP W/TINT 26 (MISCELLANEOUS) ×3 IMPLANT
COOLER POLAR GLACIER W/PUMP (MISCELLANEOUS) ×3 IMPLANT
COVER BACK TABLE REUSABLE LG (DRAPES) ×3 IMPLANT
COVER WAND RF STERILE (DRAPES) ×3 IMPLANT
DIAL VERSA SHOULDER 40 STD (Joint) ×3 IMPLANT
DRAPE 3/4 80X56 (DRAPES) ×6 IMPLANT
DRAPE IMP U-DRAPE 54X76 (DRAPES) ×6 IMPLANT
DRAPE INCISE IOBAN 66X45 STRL (DRAPES) ×6 IMPLANT
DRILL BIT F/CENTRAL SCRW 3.2MM (BIT) ×3
DRSG OPSITE POSTOP 4X8 (GAUZE/BANDAGES/DRESSINGS) ×3 IMPLANT
ELECT BLADE 6.5 EXT (BLADE) IMPLANT
ELECT CAUTERY BLADE 6.4 (BLADE) ×3 IMPLANT
GLOVE BIO SURGEON STRL SZ7.5 (GLOVE) ×12 IMPLANT
GLOVE BIO SURGEON STRL SZ8 (GLOVE) ×12 IMPLANT
GLOVE BIOGEL PI IND STRL 8 (GLOVE) ×1 IMPLANT
GLOVE BIOGEL PI INDICATOR 8 (GLOVE) ×2
GLOVE INDICATOR 8.0 STRL GRN (GLOVE) ×3 IMPLANT
GOWN STRL REUS W/ TWL LRG LVL3 (GOWN DISPOSABLE) ×1 IMPLANT
GOWN STRL REUS W/ TWL XL LVL3 (GOWN DISPOSABLE) ×1 IMPLANT
GOWN STRL REUS W/TWL LRG LVL3 (GOWN DISPOSABLE) ×3
GOWN STRL REUS W/TWL XL LVL3 (GOWN DISPOSABLE) ×3
HOOD PEEL AWAY FLYTE STAYCOOL (MISCELLANEOUS) ×12 IMPLANT
ILLUMINATOR WAVEGUIDE N/F (MISCELLANEOUS) ×3 IMPLANT
KIT STABILIZATION SHOULDER (MISCELLANEOUS) ×3 IMPLANT
KIT TURNOVER KIT A (KITS) ×3 IMPLANT
MANIFOLD NEPTUNE II (INSTRUMENTS) ×3 IMPLANT
MASK FACE SPIDER DISP (MASK) ×3 IMPLANT
MAT ABSORB  FLUID 56X50 GRAY (MISCELLANEOUS) ×2
MAT ABSORB FLUID 56X50 GRAY (MISCELLANEOUS) ×1 IMPLANT
NDL SAFETY ECLIPSE 18X1.5 (NEEDLE) ×1 IMPLANT
NEEDLE HYPO 18GX1.5 SHARP (NEEDLE) ×3
NEEDLE HYPO 22GX1.5 SAFETY (NEEDLE) ×3 IMPLANT
NEEDLE SPNL 20GX3.5 QUINCKE YW (NEEDLE) ×3 IMPLANT
NS IRRIG 500ML POUR BTL (IV SOLUTION) ×3 IMPLANT
PACK ARTHROSCOPY SHOULDER (MISCELLANEOUS) ×3 IMPLANT
PAD ARMBOARD 7.5X6 YLW CONV (MISCELLANEOUS) ×3 IMPLANT
PAD WRAPON POLAR SHDR UNIV (MISCELLANEOUS) ×1 IMPLANT
PENCIL SMOKE EVACUATOR (MISCELLANEOUS) ×3 IMPLANT
PIN THREADED REVERSE (PIN) ×3 IMPLANT
PULSAVAC PLUS IRRIG FAN TIP (DISPOSABLE) ×3
SCREW BONE CORT 6.5X35MM (Screw) ×3 IMPLANT
SCREW BONE LOCKING 4.75X30X3.5 (Screw) ×3 IMPLANT
SCREW BONE LOCKING 4.75X40X3.5 (Screw) IMPLANT
SCREW LOCKING STRL 4.75X25X3.5 (Screw) ×3 IMPLANT
SCREW NON-LOCK 4.75MMX15MM (Screw) ×3 IMPLANT
SCREW NON-LOCK 4.75MMX25MMX3.5 (Screw) ×3 IMPLANT
SLING ULTRA II M (MISCELLANEOUS) ×3 IMPLANT
SOL .9 NS 3000ML IRR  AL (IV SOLUTION) ×2
SOL .9 NS 3000ML IRR AL (IV SOLUTION) ×1
SOL .9 NS 3000ML IRR UROMATIC (IV SOLUTION) ×1 IMPLANT
SPONGE LAP 18X18 RF (DISPOSABLE) ×3 IMPLANT
STAPLER SKIN PROX 35W (STAPLE) ×3 IMPLANT
STEM HUMERAL STRL 12MMX14MM (Stem) ×3 IMPLANT
SUT ETHIBOND 0 MO6 C/R (SUTURE) ×3 IMPLANT
SUT FIBERWIRE #2 38 BLUE 1/2 (SUTURE) ×12
SUT VIC AB 0 CT1 36 (SUTURE) ×3 IMPLANT
SUT VIC AB 2-0 CT1 27 (SUTURE) ×6
SUT VIC AB 2-0 CT1 TAPERPNT 27 (SUTURE) ×2 IMPLANT
SUTURE FIBERWR #2 38 BLUE 1/2 (SUTURE) ×4 IMPLANT
SYR 10ML LL (SYRINGE) ×3 IMPLANT
SYR 30ML LL (SYRINGE) IMPLANT
TIP FAN IRRIG PULSAVAC PLUS (DISPOSABLE) ×1 IMPLANT
TRAY HUM MINI SHOULDER +0 40D (Shoulder) ×3 IMPLANT
WRAPON POLAR PAD SHDR UNIV (MISCELLANEOUS) ×3

## 2020-05-01 NOTE — H&P (Signed)
History of Present Illness:  Brian Bean is a 78 y.o. male who presents for follow-up of his right shoulder pain secondary to impingement/tendinopathy with a massive chronic irreparable rotator cuff tear. The patient notes little change in his symptoms since his last visit 4 weeks ago. He continues to have pain with activities at or above shoulder level, as well as at night. At this last visit, we discussed surgical options to include a reverse total shoulder arthroplasty versus arthroscopic debridement with placement of an in space balloon into the subacromial space. The patient presents today to discuss these options further. He has more questions regarding these procedures and as to which one might be his best option. He denies any reinjury to the shoulder, and denies any numbness or paresthesias down his arm to his hand.  Current Outpatient Medications: . amLODIPine (NORVASC) 5 MG tablet Take 5 mg by mouth 2 (two) times daily  . biotin 10 mg Tab Take 10 mg by mouth once daily  . carvediloL (COREG) 25 MG tablet Take 12.5 mg by mouth once daily  . cholecalciferol (VITAMIN D3) 5,000 unit capsule Take 5,000 Units by mouth once daily  . diclofenac (VOLTAREN) 1 % topical gel Apply 2 g topically 4 (four) times daily  . FLUoxetine (PROZAC) 40 MG capsule Take 80 mg by mouth once daily  . hydroCHLOROthiazide (HYDRODIURIL) 25 MG tablet Take 1 tablet by mouth once daily  . ketoconazole (NIZORAL) 2 % shampoo APPLY EXTERNALLY 2 TIMES A WEEK  . lisinopriL (ZESTRIL) 10 MG tablet Take 10 mg by mouth once daily  . melatonin 10 mg Tab Take 1 tablet by mouth once daily  . naproxen sodium (ALEVE) 220 MG tablet Take 220 mg by mouth as needed for Pain  . pravastatin (PRAVACHOL) 10 MG tablet Take 1 tablet by mouth once daily  . vardenafiL (LEVITRA) 20 MG tablet Take 1 tablet by mouth once daily as needed   Allergies: No Known Allergies  Past Medical History:  . Anxiety  . Arthritis  . Avascular necrosis of  hip, right (CMS-HCC)  . Cancer (CMS-HCC)  skin lesion scalp  . CLL (chronic lymphocytic leukemia) (CMS-HCC)  . HTN (hypertension)  . Lymphadenopathy, abdominal  . Obesity  . Prostate cancer (CMS-HCC)  . Thrombocytopenia (CMS-HCC)   Past Surgical History:  . APPENDECTOMY  . BIOPSY PROSTATE INCISIONAL  . cervical disc surgery  . COLONOSCOPY  . fracture surgery Right  ankle  . hernia repair surgery  right and undistended testicle  . right hip pinned Right  2012  . total hip arthoplasty Right 2013   History reviewed. No pertinent family history.   Social History:   Socioeconomic History:  Marland Kitchen Marital status: Divorced  Spouse name: Not on file  . Number of children: Not on file  . Years of education: Not on file  . Highest education level: Not on file  Occupational History  . Not on file  Tobacco Use  . Smoking status: Never Smoker  . Smokeless tobacco: Never Used  Vaping Use  . Vaping Use: Never used  Substance and Sexual Activity  . Alcohol use: Never  . Drug use: Defer  . Sexual activity: Defer  Other Topics Concern  . Not on file  Social History Narrative  . Not on file   Social Determinants of Health:   Financial Resource Strain:  . Difficulty of Paying Living Expenses:  Food Insecurity:  . Worried About Charity fundraiser in the Last Year:  . Ran  Out of Food in the Last Year:  Transportation Needs:  . Lack of Transportation (Medical):  Marland Kitchen Lack of Transportation (Non-Medical):   Review of Systems:  A comprehensive 14 point ROS was performed, reviewed, and the pertinent orthopaedic findings are documented in the HPI.  Physical Exam: Vitals:  03/19/20 1057  BP: 134/80  Weight: 98.3 kg (216 lb 12.8 oz)  Height: 177.8 cm (5\' 10" )  PainSc: 1  PainLoc: Shoulder   General/Constitutional: The patient appears to be well-nourished, well-developed, and in no acute distress. Neuro/Psych: Normal mood and affect, oriented to person, place and time.  Eyes:  Non-icteric. Pupils are equal, round, and reactive to light, and exhibit synchronous movement. ENT: Unremarkable. Lymphatic: No palpable adenopathy. Respiratory: No wheezes and Non-labored breathing Cardiovascular: No edema, swelling or tenderness, except as noted in detailed exam. Integumentary: No impressive skin lesions present, except as noted in detailed exam. Musculoskeletal: Unremarkable, except as noted in detailed exam.  Right shoulder exam: SKIN: Normal SWELLING: None WARMTH: None LYMPH NODES: No adenopathy palpable CREPITUS: None TENDERNESS: Mild tenderness over anterolateral shoulder ROM (active):  Forward flexion: 150 degrees Abduction: 145 degrees Internal rotation: L1 ROM (passive):  Forward flexion: 155 degrees Abduction: 150 degrees  ER/IR at 90 abd: 90 degrees/60 degrees  He notes mild soreness at the extremes of forward flexion, abduction, internal rotation, and internal rotation at 90 degrees of abduction.  STRENGTH: Forward flexion: 4/5 Abduction: 4/5 External rotation: 4-4+/5 Internal rotation: 4+/5 Pain with RC testing: Mild pain with resisted forward flexion and abduction  STABILITY: Normal  SPECIAL TESTS: Luan Pulling' test: Mildly positive Speed's test: Negative Capsulitis - pain w/ passive ER: No Crossed arm test: Negative Crank: Not evaluated Anterior apprehension: Negative Posterior apprehension: Not evaluated  He remains neurovascularly intact to the right upper extremity.  Assessment: . Traumatic complete tear of right rotator cuff.  . Rotator cuff tendinitis, right.  . Injury of tendon of long head of right biceps.  Plan: The treatment options were discussed with the patient and his friend. In addition, patient educational materials were provided regarding the diagnosis and treatment options. The patient has done quite a bit of research on the potential benefits and drawbacks of the and space below device. Per his research, it appears  that the and space balloon device is less effective in a person with an irreparable subscapularis tendon tear. Furthermore, the benefits of the procedure, even though they may last 3 to 5 years, are temporary. Therefore, I have recommended that we proceed with a reverse right total shoulder arthroplasty. The procedure was discussed with the patient, as were the potential risks (including bleeding, infection, nerve and/or blood vessel injury, persistent or recurrent pain, loosening and/or failure of the components, dislocation, need for further surgery, blood clots, strokes, heart attacks and/or arhythmias, pneumonia, etc.) and benefits. All of the patient's questions and concerns were answered. He would like to think about his options further before deciding on whether to proceed with his surgery. He can call any time with further concerns. He will follow up on an as necessary basis.   H&P reviewed and patient re-examined. No changes.

## 2020-05-01 NOTE — Transfer of Care (Signed)
Immediate Anesthesia Transfer of Care Note  Patient: Brian Bean  Procedure(s) Performed: REVERSE SHOULDER ARTHROPLASTY (Right Shoulder)  Patient Location: PACU  Anesthesia Type:GA combined with regional for post-op pain  Level of Consciousness: awake, drowsy and patient cooperative  Airway & Oxygen Therapy: Patient Spontanous Breathing and Patient connected to face mask oxygen  Post-op Assessment: Report given to RN and Post -op Vital signs reviewed and stable  Post vital signs: Reviewed and stable  Last Vitals:  Vitals Value Taken Time  BP 130/81 05/01/20 1254  Temp 36.1 C 05/01/20 1254  Pulse 70 05/01/20 1259  Resp 23 05/01/20 1259  SpO2 95 % 05/01/20 1259  Vitals shown include unvalidated device data.  Last Pain:  Vitals:   05/01/20 1254  TempSrc:   PainSc: 0-No pain         Complications: No complications documented.

## 2020-05-01 NOTE — Anesthesia Preprocedure Evaluation (Addendum)
Anesthesia Evaluation  Patient identified by MRN, date of birth, ID band Patient awake    Reviewed: Allergy & Precautions, H&P , NPO status , Patient's Chart, lab work & pertinent test results, reviewed documented beta blocker date and time   History of Anesthesia Complications (+) PONV and history of anesthetic complications  Airway Mallampati: II  TM Distance: >3 FB Neck ROM: full    Dental  (+) Teeth Intact   Pulmonary neg pulmonary ROS, former smoker,    Pulmonary exam normal        Cardiovascular Exercise Tolerance: Good hypertension, On Medications and Pt. on medications negative cardio ROS Normal cardiovascular exam Rhythm:regular Rate:Normal     Neuro/Psych Anxiety negative neurological ROS  negative psych ROS   GI/Hepatic negative GI ROS, Neg liver ROS,   Endo/Other  negative endocrine ROS  Renal/GU negative Renal ROS  negative genitourinary   Musculoskeletal  (+) Arthritis , Osteoarthritis,    Abdominal   Peds  Hematology negative hematology ROS (+)   Anesthesia Other Findings Past Medical History: No date: Anxiety No date: Arthritis No date: Cancer [facility])     Comment:  skin lesion scalp 01/06/2013: CLL (chronic lymphocytic leukemia) ([place]) No date: CLL (chronic lymphocytic leukemia) ([facility]) No date: Hypertension No date: Lymphocytosis No date: PONV (postoperative nausea and vomiting)     Comment:  extremely sick did not get sick after hip surgery in               2013 No date: Prostate cancer [facility]) Past Surgical History: No date: APPENDECTOMY No date: CATARACT EXTRACTION, BILATERAL 00: CERVICAL [place] SURGERY 2011: COLONOSCOPY     Comment:  [facility].  No date: FRACTURE SURGERY     Comment:  rt ankle No date: HERNIA REPAIR     Comment:  rt and  undistended testicle No date: PROSTATE BIOPSY 12: rt hip     Comment:  pinned 05/03/2012: TOTAL HIP ARTHROPLASTY     Comment:  Procedure: TOTAL HIP  ARTHROPLASTY;  Surgeon: Kerin Salen, MD;  Location: [place];  Service: Orthopedics;                Laterality: Right;   Reproductive/Obstetrics negative OB ROS                            Anesthesia Physical Anesthesia Plan  ASA: II  Anesthesia Plan: General ETT   Post-op Pain Management:  Regional for Post-op pain   Induction:   PONV Risk Score and Plan:   Airway Management Planned:   Additional Equipment:   Intra-op Plan:   Post-operative Plan:   Informed Consent: I have reviewed the patients History and Physical, chart, labs and discussed the procedure including the risks, benefits and alternatives for the proposed anesthesia with the patient or authorized representative who has indicated his/her understanding and acceptance.     Dental Advisory Given  Plan Discussed with: CRNA  Anesthesia Plan Comments:         Anesthesia Quick Evaluation

## 2020-05-01 NOTE — Anesthesia Procedure Notes (Addendum)
Anesthesia Regional Block: Interscalene brachial plexus block   Pre-Anesthetic Checklist: ,, timeout performed, Correct Patient, Correct Site, Correct Laterality, Correct Procedure, Correct Position, site marked, Risks and benefits discussed,  Surgical consent,  Pre-op evaluation,  At surgeon's request and post-op pain management  Laterality: Right  Prep: chloraprep       Needles:  Injection technique: Single-shot  Needle Type: Echogenic Stimulator Needle     Needle Length: 10cm  Needle Gauge: 20     Additional Needles:   Procedures:,,,, ultrasound used (permanent image in chart),,,,  Narrative:  Start time: 05/01/2020 9:40 AM End time: 05/01/2020 9:44 AM Injection made incrementally with aspirations every 5 mL.  Performed by: Personally   Additional Notes: Functioning IV was confirmed and monitors were applied.Sterile prep and drape,hand hygiene and sterile gloves were used. Time out performed.  Negative aspiration and negative test dose prior to incremental administration of local anesthetic. The patient tolerated the procedure well.

## 2020-05-01 NOTE — Discharge Instructions (Addendum)
Orthopedic discharge instructions: May shower with intact OpSite dressing.  Apply ice frequently to shoulder or use Polar Care. Take Aleve 2 tabs BID OR ibuprofen 600-800 mg TID with meals for 7-10 days, then as necessary.  TID = 3x daily (every 8 hours)   BID = twice daily (every 12 hours) Take oxycodone as prescribed when needed.  May supplement with ES Tylenol if necessary. Keep shoulder immobilizer on at all times except may remove for bathing purposes and exercises. Follow-up in 10-14 days or as scheduled.     Interscalene Nerve Block with Exparel  1.  For your surgery you have received an Interscalene Nerve Block with Exparel. 2. Nerve Blocks affect many types of nerves, including nerves that control movement, pain and normal sensation.  You may experience feelings such as numbness, tingling, heaviness, weakness or the inability to move your arm or the feeling or sensation that your arm has "fallen asleep". 3. A nerve block with Exparel can last up to 5 days.  Usually the weakness wears off first.  The tingling and heaviness usually wear off next.  Finally you may start to notice pain.  Keep in mind that this may occur in any order.  Once a nerve block starts to wear off it is usually completely gone within 60 minutes. 4. ISNB may cause mild shortness of breath, a hoarse voice, blurry vision, unequal pupils, or drooping of the face on the same side as the nerve block.  These symptoms will usually resolve with the numbness.  Very rarely the procedure itself can cause mild seizures. 5. If needed, your surgeon will give you a prescription for pain medication.  It will take about 60 minutes for the oral pain medication to become fully effective.  So, it is recommended that you start taking this medication before the nerve block first begins to wear off, or when you first begin to feel discomfort. 6. Take your pain medication only as prescribed.  Pain medication can cause sedation and decrease your  breathing if you take more than you need for the level of pain that you have. 7. Nausea is a common side effect of many pain medications.  You may want to eat something before taking your pain medicine to prevent nausea. 8. After an Interscalene nerve block, you cannot feel pain, pressure or extremes in temperature in the effected arm.  Because your arm is numb it is at an increased risk for injury.  To decrease the possibility of injury, please practice the following:  a. While you are awake change the position of your arm frequently to prevent too much pressure on any one area for prolonged periods of time. b.  If you have a cast or tight dressing, check the color or your fingers every couple of hours.  Call your surgeon with the appearance of any discoloration (white or blue). c. If you are given a sling to wear before you go home, please wear it  at all times until the block has completely worn off.  Do not get up at night without your sling. d. Please contact [facility] or your surgeon if you do not begin to regain sensation after 7 days from the surgery.  Anesthesia may be contacted by calling the Same Day Surgery Department, Mon. through Fri., 6 am to 4 pm at [phone].   e. If you experience any other problems or concerns, please contact your surgeon's office. f. If you experience severe or prolonged shortness of breath go  to the nearest emergency department.   AMBULATORY SURGERY  DISCHARGE INSTRUCTIONS   1) The drugs that you were given will stay in your system until tomorrow so for the next 24 hours you should not:  A) Drive an automobile B) Make any legal decisions C) Drink any alcoholic beverage   2) You may resume regular meals tomorrow.  Today it is better to start with liquids and gradually work up to solid foods.  You may eat anything you prefer, but it is better to start with liquids, then soup and crackers, and gradually work up to solid foods.   3) Please notify  your doctor immediately if you have any unusual bleeding, trouble breathing, redness and pain at the surgery site, drainage, fever, or pain not relieved by medication.    4) Additional Instructions:        Please contact your physician with any problems or Same Day Surgery at [phone], Monday through Friday 6 am to 4 pm, or [place] at [facility] number at [phone].

## 2020-05-01 NOTE — OR Nursing (Signed)
Occupational Therapy in for evaluation @ 1419.

## 2020-05-01 NOTE — OR Nursing (Signed)
PT in for evaluation @ 1510.

## 2020-05-01 NOTE — Evaluation (Signed)
Physical Therapy Evaluation Patient Details Name: Brian Bean MRN: [id number] DOB: 1941-05-22 Today's Date: 05/01/2020   History of Present Illness  79 y/o male s/p R total shoulder replacement 11/23.  Clinical Impression  Pt did well with PT exam showing good confidence and safety with prolonged (~200 ft) of ambulation and stair negotiation w/o issue.  He did have some fatigue/shortness of breath with the effort but sats remain in the 90s on room air, HR up to ~110 bpm with effort but relatively stable.  He showed good understanding of HEP, precautions though he did need regular reorientation as he was consistently off topic.     Follow Up Recommendations Follow surgeon's recommendation for DC plan and follow-up therapies;Home health PT    Equipment Recommendations  None recommended by PT    Recommendations for Other Services       Precautions / Restrictions Precautions Precautions: None Shoulder Interventions: Shoulder sling/immobilizer;Off for dressing/bathing/exercises Precaution Booklet Issued: Yes (comment) Required Braces or Orthoses: Sling Restrictions Weight Bearing Restrictions: Yes RUE Weight Bearing: Non weight bearing      Mobility  Bed Mobility               General bed mobility comments: in recliner on arrival, not tested    Transfers Overall transfer level: Needs assistance Equipment used: None Transfers: Sit to/from Stand Sit to Stand: Supervision Stand pivot transfers: Supervision       General transfer comment: easily gets to standing w/o assist, good confidence and balance  Ambulation/Gait Ambulation/Gait assistance: Supervision Gait Distance (Feet): 200 Feet         General Gait Details: Pt was able to ambulate without issue, no LOBs or overt safety issues.  Pt became fatigued with the effort, but O2 did remain in the 90s and HR in the 100s t/o the effort.  Stairs Stairs: Yes Stairs assistance: Supervision Stair Management: One  rail Right;One rail Left;Alternating pattern Number of Stairs: 4 General stair comments: Pt easily negotiates up/down steps w/o hesitation, minimal UE reliance on eails  Wheelchair Mobility    Modified Rankin (Stroke Patients Only)       Balance Overall balance assessment: Independent   Sitting balance-Leahy Scale: Normal     Standing balance support: During functional activity Standing balance-Leahy Scale: Good Standing balance comment: supervision - CGA with dressing tasks                             Pertinent Vitals/Pain Pain Assessment: No/denies pain Faces Pain Scale: Hurts a little bit Pain Location: reports general soreness, pain still managed post surgery Pain Descriptors / Indicators: Discomfort Pain Intervention(s): Limited activity within patient's tolerance;Monitored during session;Premedicated before session;Repositioned;Ice applied    Home Living Family/patient expects to be discharged to:: Private residence Living Arrangements: Spouse/significant other Available Help at Discharge: Family;Available 24 hours/day Type of Home: House Home Access: Stairs to enter Entrance Stairs-Rails: Psychiatric nurse of Steps: 4-5 Home Layout: One level Home Equipment: Walker - 2 wheels;Cane - single point Additional Comments: Pt inappropriate during education and needing redirection    Prior Function Level of Independence: Independent         Comments: very active     Hand Dominance   Dominant Hand: Right    Extremity/Trunk Assessment   Upper Extremity Assessment Upper Extremity Assessment: Generalized weakness (R UE in sling, no shoulder mvt, L WNL) RUE Deficits / Details: NWB R UE - ROM not tested secondary to surgical procedure  Lower Extremity Assessment Lower Extremity Assessment: Overall WFL for tasks assessed    Cervical / Trunk Assessment Cervical / Trunk Assessment: Normal  Communication   Communication: No  difficulties  Cognition Arousal/Alertness: Awake/alert Behavior During Therapy: WFL for tasks assessed/performed Overall Cognitive Status: Within Functional Limits for tasks assessed                                        General Comments      Exercises     Assessment/Plan    PT Assessment Patient needs continued PT services  PT Problem List Decreased strength;Decreased range of motion;Decreased activity tolerance;Decreased balance;Decreased mobility;Decreased coordination;Decreased knowledge of use of DME;Decreased safety awareness;Decreased knowledge of precautions;Pain       PT Treatment Interventions Gait training;Stair training;Functional mobility training;Therapeutic activities;Therapeutic exercise;Balance training;Neuromuscular re-education;Patient/family education    PT Goals (Current goals can be found in the Care Plan section)  Acute Rehab PT Goals Patient Stated Goal: to go home PT Goal Formulation: With patient Time For Goal Achievement: 05/15/20 Potential to Achieve Goals: Good    Frequency Min 2X/week   Barriers to discharge        Co-evaluation               AM-PAC PT "6 Clicks" Mobility  Outcome Measure Help needed turning from your back to your side while in a flat bed without using bedrails?: None Help needed moving from lying on your back to sitting on the side of a flat bed without using bedrails?: None Help needed moving to and from a bed to a chair (including a wheelchair)?: None Help needed standing up from a chair using your arms (e.g., wheelchair or bedside chair)?: None Help needed to walk in hospital room?: None Help needed climbing 3-5 steps with a railing? : None 6 Click Score: 24    End of Session Equipment Utilized During Treatment: Gait belt Activity Tolerance: Patient tolerated treatment well Patient left: in chair;with call bell/phone within reach Nurse Communication: Mobility status PT Visit Diagnosis:  Pain;Muscle weakness (generalized) (M62.81) Pain - Right/Left: Right Pain - part of body: Shoulder    Time: 4695-0722 PT Time Calculation (min) (ACUTE ONLY): 21 min   Charges:   PT Evaluation $PT Eval Low Complexity: 1 Low PT Treatments $Gait Training: 8-22 mins        Kreg Shropshire, DPT 05/01/2020, 5:18 PM

## 2020-05-01 NOTE — Evaluation (Signed)
Occupational Therapy Evaluation Patient Details Name: Brian Bean MRN: [id number] DOB: Nov 18, 1940 Today's Date: 05/01/2020    History of Present Illness The patient is a 79 year old male with a history of progressively worsening pain and weakness of his right shoulder. His symptoms worsened several months ago following a fall. The symptoms have persisted despite medications, activity modification, etc. His history and examination are consistent with a massive irreparable rotator cuff tear with early cuff arthropathy, all of which were confirmed by MRI scan. The patient presents at this time for a reverse right total shoulder arthroplasty.   Clinical Impression   Pt s/p reverse R total shoulder arthroplasty. Pt's wife present during evaluation for education and hands on practice. OT providing pt with paper handout for how to safely wear shoulder sling, NWB precautions, and how to perform ADL tasks with these precautions. OT demonstrated donning/doffing and how to dress with caregiver returning demonstrations and pt needing min A overall for task. OT also reviewing polar care with caregiver and she demonstrated how to don/doff during this session and how to safely utilize at home. OT demonstrated R UE digits,wrist, and elbow exercise with pt returning demonstrations. Pt will have 24/7 assistance at home at discharge. No further equipment needed at this time and pt and wife with no further questions. Pt does not need further acute OT intervention at this time.  OT will SIGN OFF.     Follow Up Recommendations  Follow surgeon's recommendation for DC plan and follow-up therapies;Supervision - Intermittent    Equipment Recommendations  None recommended by OT       Precautions / Restrictions Precautions Shoulder Interventions: Shoulder sling/immobilizer;Off for dressing/bathing/exercises Precaution Booklet Issued: Yes (comment) Required Braces or Orthoses: Sling Restrictions Weight Bearing  Restrictions: Yes RUE Weight Bearing: Non weight bearing      Mobility Bed Mobility      General bed mobility comments: seated in recliner chair upon entering the room    Transfers Overall transfer level: Needs assistance Equipment used: None Transfers: Sit to/from Stand;Stand Pivot Transfers Sit to Stand: Supervision Stand pivot transfers: Supervision       Balance     Sitting balance-Leahy Scale: Normal     Standing balance support: During functional activity Standing balance-Leahy Scale: Good Standing balance comment: supervision - CGA with dressing tasks          ADL either performed or assessed with clinical judgement   ADL Overall ADL's : Needs assistance/impaired Eating/Feeding: Set up   Grooming: Wash/dry hands;Wash/dry face;Oral care;Sitting;Set up           Upper Body Dressing : Minimal assistance;Sitting   Lower Body Dressing: Min guard;Sit to/from stand                       Vision Baseline Vision/History: Wears glasses Wears Glasses: At all times Patient Visual Report: No change from baseline              Pertinent Vitals/Pain Pain Assessment: Faces Faces Pain Scale: Hurts a little bit Pain Location: R shoulder Pain Descriptors / Indicators: Discomfort Pain Intervention(s): Limited activity within patient's tolerance;Monitored during session;Premedicated before session;Repositioned;Ice applied     Hand Dominance Right   Extremity/Trunk Assessment Upper Extremity Assessment Upper Extremity Assessment: RUE deficits/detail RUE Deficits / Details: NWB R UE - ROM not tested secondary to surgical procedure   Lower Extremity Assessment Lower Extremity Assessment: Overall WFL for tasks assessed   Cervical / Trunk Assessment Cervical / Trunk Assessment: Normal  Communication Communication Communication: No difficulties   Cognition Arousal/Alertness: Awake/alert Behavior During Therapy: WFL for tasks  assessed/performed Overall Cognitive Status: Within Functional Limits for tasks assessed                     Home Living Family/patient expects to be discharged to:: Private residence Living Arrangements: Spouse/significant other Available Help at Discharge: Family;Available 24 hours/day Type of Home: House Home Access: Stairs to enter [employer] of Steps: 4-5 Entrance Stairs-Rails: Right;Left Home Layout: One level      Home Equipment: Walker - 2 wheels;Cane - single point   Additional Comments: Pt inappropriate during education and needing redirection      Prior Functioning/Environment Level of Independence: Independent        Comments: very active        OT Problem List: Decreased strength;Pain;Decreased activity tolerance;Decreased safety awareness;Impaired balance (sitting and/or standing);Decreased knowledge of use of DME or AE;Decreased knowledge of precautions         OT Goals(Current goals can be found in the care plan section) Acute Rehab OT Goals Patient Stated Goal: to go home OT Goal Formulation: With patient/family Time For Goal Achievement: 05/15/20 Potential to Achieve Goals: Good  OT Frequency:      AM-PAC OT "6 Clicks" Daily Activity     Outcome Measure Help from another person eating meals?: A Little Help from another person taking care of personal grooming?: A Little Help from another person toileting, which includes using toliet, bedpan, or urinal?: A Little   Help from another person to put on and taking off regular upper body clothing?: A Little Help from another person to put on and taking off regular lower body clothing?: A Little 6 Click Score: 15   End of Session Equipment Utilized During Treatment: Other (comment) (sling) Nurse Communication: Mobility status  Activity Tolerance: Patient tolerated treatment well Patient left: in chair;with family/visitor present;with nursing/sitter in room  OT Visit Diagnosis:  Muscle weakness (generalized) (M62.81)                Time: 2836-6294 OT Time Calculation (min): 50 min Charges:  OT General Charges $OT Visit: 1 Visit OT Evaluation $OT Eval Low Complexity: 1 Low OT Treatments $Self Care/Home Management : 23-37 mins $Therapeutic Exercise: 8-22 mins  Darleen Crocker, MS, OTR/L , CBIS ascom [phone]  05/01/20, 4:16 PM

## 2020-05-01 NOTE — Anesthesia Procedure Notes (Signed)
Procedure Name: Intubation Date/Time: 05/01/2020 10:29 AM Performed by: Lowry Bowl, CRNA Pre-anesthesia Checklist: Patient identified, Emergency Drugs available, Suction available and Patient being monitored Patient Re-evaluated:Patient Re-evaluated prior to induction Oxygen Delivery Method: Circle system utilized Preoxygenation: Pre-oxygenation with 100% oxygen Induction Type: IV induction and Cricoid Pressure applied Ventilation: Mask ventilation without difficulty Laryngoscope Size: McGraph and 4 Grade View: Grade II Tube type: Oral Tube size: 7.5 mm Number of attempts: 1 Airway Equipment and Method: Stylet and Video-laryngoscopy Placement Confirmation: ETT inserted through vocal cords under direct vision,  positive ETCO2 and breath sounds checked- equal and bilateral Secured at: 23 cm Tube secured with: Tape Dental Injury: Teeth and Oropharynx as per pre-operative assessment

## 2020-05-01 NOTE — Op Note (Signed)
05/01/2020  12:51 PM  Patient:   Brian Bean  Pre-Op Diagnosis:   Massive irreparable rotator cuff tear with early cuff arthropathy, right shoulder.  Post-Op Diagnosis:   Same  Procedure:   Reverse right total shoulder arthroplasty.  Surgeon:   Pascal Lux, MD  Assistant:   Cameron Proud, PA-C; Sherlene Shams, PA-S  Anesthesia:   General endotracheal with an interscalene block using Exparel placed preoperatively by the anesthesiologist.  Findings:   As above.  Complications:   None  EBL:    100 cc  Fluids:   900 cc crystalloid  UOP:   None  TT:   None  Drains:   None  Closure:   Staples  Implants:   All press-fit Bi[employer] Comprehensive system with a #12 micro-humeral stem, a 40 mm humeral tray with a standard insert, and a mini-base plate with a 40 mm glenosphere.  Brief Clinical Note:   The patient is a 79 year old male with a history of progressively worsening pain and weakness of his right shoulder. His symptoms worsened several months ago following a fall. The symptoms have persisted despite medications, activity modification, etc. His history and examination are consistent with a massive irreparable rotator cuff tear with early cuff arthropathy, all of which were confirmed by MRI scan. The patient presents at this time for a reverse right total shoulder arthroplasty.  Procedure:   The patient underwent placement of an interscalene block using Exparel by the anesthesiologist in the preoperative holding area before being brought into the operating room and lain in the supine position. The patient then underwent general endotracheal intubation and anesthesia before the patient was repositioned in the beach chair position using the beach chair positioner. The right shoulder and upper extremity were prepped with ChloraPrep solution before being draped sterilely. Preoperative antibiotics were administered.   A standard anterior approach to the shoulder was made through an  approximately 4-5 inch incision. The incision was carried down through the subcutaneous tissues to expose the deltopectoral fascia. The interval between the deltoid and pectoralis muscles was identified and this plane developed, retracting the cephalic vein laterally with the deltoid muscle. The conjoined tendon was identified. Its lateral margin was dissected and the Kolbel self-retraining retractor inserted. The "three sisters" were identified and cauterized. Bursal tissues were removed to improve visualization. The subscapularis tendon was released from its attachment to the lesser tuberosity 1 cm proximal to its insertion and several tagging sutures placed. The inferior capsule was released with care after identifying and protecting the axillary nerve. The proximal humeral cut was made at approximately 25 of retroversion using the extra-medullary guide.   Attention was redirected to the glenoid. The labrum was debrided circumferentially before the center of the glenoid was marked with electrocautery. The guidewire was drilled into the glenoid neck using the appropriate guide. After verifying its position, it was overreamed with the mini-baseplate reamer to create a flat surface. The permanent mini-baseplate was impacted into place. It was stabilized with a 35 x 6.5 mm central screw and four peripheral screws. Locking screws were placed superiorly and inferiorly while nonlocking screws were placed anteriorly and posteriorly. The permanent 40 mm glenosphere was then impacted into place and its Morse taper locking mechanism verified using manual distraction.  Attention was directed to the humeral side. The humeral canal was reamed sequentially beginning with the end-cutting reamer then progressing from a 4 mm reamer up to a 12 mm reamer. This provided excellent circumferential chatter. The canal was broached beginning with  a #10 broach and progressing to a #12 broach. This was left in place and a trial  reduction performed using the standard trial humeral platform. The arm demonstrated excellent range of motion as the hand could be brought across the chest to the opposite shoulder and brought to the top of the patient's head and to the patient's ear. The shoulder appeared stable throughout this range of motion. The joint was dislocated and the trial components removed. The permanent #12 micro-stem was impacted into place with care taken to maintain the appropriate version. The permanent 40 mm humeral platform with the standard insert was put together on the back table and impacted into place. Again, the [facility] taper locking mechanism was verified using manual distraction. The shoulder was relocated using two finger pressure and again placed through a range of motion with the findings as described above.  The wound was copiously irrigated with sterile saline solution using the jet lavage system before a total of 30 cc of 0.5% Sensorcaine with epinephrine was injected into the pericapsular and peri-incisional tissues to help with postoperative analgesia. The subscapularis tendon was reapproximated using #2 FiberWire interrupted sutures. The deltopectoral interval was closed using #0 Vicryl interrupted sutures before the subcutaneous tissues were closed using 2-0 Vicryl interrupted sutures. The skin was closed using staples. Prior to closing the skin, 1 g of transexemic acid in 10 cc of normal saline was injected intra-articularly to help with postoperative bleeding. A sterile occlusive dressing was applied to the wound before the arm was placed into a shoulder immobilizer with an abduction pillow. A Polar Care system also was applied to the shoulder. The patient was then transferred back to a hospital bed before being awakened, extubated, and returned to the recovery room in satisfactory condition after tolerating the procedure well.

## 2020-05-02 ENCOUNTER — Encounter: Payer: Self-pay | Admitting: Surgery

## 2020-05-02 LAB — SURGICAL PATHOLOGY

## 2020-05-02 NOTE — Anesthesia Postprocedure Evaluation (Signed)
Anesthesia Post Note  Patient: Brian Bean  Procedure(s) Performed: REVERSE SHOULDER ARTHROPLASTY (Right Shoulder)  Patient location during evaluation: PACU Anesthesia Type: General Level of consciousness: awake and alert Pain management: pain level controlled Vital Signs Assessment: post-procedure vital signs reviewed and stable Respiratory status: spontaneous breathing, nonlabored ventilation, respiratory function stable and patient connected to nasal cannula oxygen Cardiovascular status: blood pressure returned to baseline and stable Postop Assessment: no apparent nausea or vomiting Anesthetic complications: no   No complications documented.   Last Vitals:  Vitals:   05/01/20 1538 05/01/20 1646  BP: (!) 149/77 (!) 156/83  Pulse: 79 87  Resp: 18 16  Temp:  (!) 36.1 C  SpO2: 96% 94%    Last Pain:  Vitals:   05/01/20 1646  TempSrc: Temporal  PainSc: 0-No pain                 Molli Barrows

## 2020-05-07 ENCOUNTER — Encounter: Payer: Self-pay | Admitting: Surgery

## 2020-06-11 DIAGNOSIS — Z96611 Presence of right artificial shoulder joint: Secondary | ICD-10-CM | POA: Diagnosis not present

## 2020-06-11 DIAGNOSIS — M6281 Muscle weakness (generalized): Secondary | ICD-10-CM | POA: Diagnosis not present

## 2020-06-11 DIAGNOSIS — M25511 Pain in right shoulder: Secondary | ICD-10-CM | POA: Diagnosis not present

## 2020-06-11 DIAGNOSIS — M25611 Stiffness of right shoulder, not elsewhere classified: Secondary | ICD-10-CM | POA: Diagnosis not present

## 2020-06-11 DIAGNOSIS — G8929 Other chronic pain: Secondary | ICD-10-CM | POA: Diagnosis not present

## 2020-06-13 DIAGNOSIS — Z96611 Presence of right artificial shoulder joint: Secondary | ICD-10-CM | POA: Diagnosis not present

## 2020-06-13 DIAGNOSIS — M25511 Pain in right shoulder: Secondary | ICD-10-CM | POA: Diagnosis not present

## 2020-06-13 DIAGNOSIS — M6281 Muscle weakness (generalized): Secondary | ICD-10-CM | POA: Diagnosis not present

## 2020-06-13 DIAGNOSIS — M25611 Stiffness of right shoulder, not elsewhere classified: Secondary | ICD-10-CM | POA: Diagnosis not present

## 2020-06-13 DIAGNOSIS — G8929 Other chronic pain: Secondary | ICD-10-CM | POA: Diagnosis not present

## 2020-06-15 DIAGNOSIS — M25511 Pain in right shoulder: Secondary | ICD-10-CM | POA: Diagnosis not present

## 2020-06-19 DIAGNOSIS — M25611 Stiffness of right shoulder, not elsewhere classified: Secondary | ICD-10-CM | POA: Diagnosis not present

## 2020-06-21 DIAGNOSIS — M25511 Pain in right shoulder: Secondary | ICD-10-CM | POA: Diagnosis not present

## 2020-06-21 DIAGNOSIS — M25611 Stiffness of right shoulder, not elsewhere classified: Secondary | ICD-10-CM | POA: Diagnosis not present

## 2020-06-21 DIAGNOSIS — M6281 Muscle weakness (generalized): Secondary | ICD-10-CM | POA: Diagnosis not present

## 2020-06-21 DIAGNOSIS — Z96611 Presence of right artificial shoulder joint: Secondary | ICD-10-CM | POA: Diagnosis not present

## 2020-06-21 DIAGNOSIS — G8929 Other chronic pain: Secondary | ICD-10-CM | POA: Diagnosis not present

## 2020-06-26 DIAGNOSIS — M25511 Pain in right shoulder: Secondary | ICD-10-CM | POA: Diagnosis not present

## 2020-06-26 DIAGNOSIS — M6281 Muscle weakness (generalized): Secondary | ICD-10-CM | POA: Diagnosis not present

## 2020-06-26 DIAGNOSIS — M25611 Stiffness of right shoulder, not elsewhere classified: Secondary | ICD-10-CM | POA: Diagnosis not present

## 2020-06-26 DIAGNOSIS — G8929 Other chronic pain: Secondary | ICD-10-CM | POA: Diagnosis not present

## 2020-06-26 DIAGNOSIS — Z96611 Presence of right artificial shoulder joint: Secondary | ICD-10-CM | POA: Diagnosis not present

## 2020-07-03 DIAGNOSIS — I10 Essential (primary) hypertension: Secondary | ICD-10-CM | POA: Diagnosis not present

## 2020-07-03 DIAGNOSIS — Z1389 Encounter for screening for other disorder: Secondary | ICD-10-CM | POA: Diagnosis not present

## 2020-07-03 DIAGNOSIS — Z79899 Other long term (current) drug therapy: Secondary | ICD-10-CM | POA: Diagnosis not present

## 2020-07-03 DIAGNOSIS — F5101 Primary insomnia: Secondary | ICD-10-CM | POA: Diagnosis not present

## 2020-07-03 DIAGNOSIS — Z0000 Encounter for general adult medical examination without abnormal findings: Secondary | ICD-10-CM | POA: Diagnosis not present

## 2020-07-03 DIAGNOSIS — D696 Thrombocytopenia, unspecified: Secondary | ICD-10-CM | POA: Diagnosis not present

## 2020-07-03 DIAGNOSIS — C9110 Chronic lymphocytic leukemia of B-cell type not having achieved remission: Secondary | ICD-10-CM | POA: Diagnosis not present

## 2020-07-03 DIAGNOSIS — E785 Hyperlipidemia, unspecified: Secondary | ICD-10-CM | POA: Diagnosis not present

## 2020-07-03 DIAGNOSIS — Z8669 Personal history of other diseases of the nervous system and sense organs: Secondary | ICD-10-CM | POA: Diagnosis not present

## 2020-07-04 DIAGNOSIS — M6281 Muscle weakness (generalized): Secondary | ICD-10-CM | POA: Diagnosis not present

## 2020-07-04 DIAGNOSIS — M25511 Pain in right shoulder: Secondary | ICD-10-CM | POA: Diagnosis not present

## 2020-07-04 DIAGNOSIS — Z96611 Presence of right artificial shoulder joint: Secondary | ICD-10-CM | POA: Diagnosis not present

## 2020-07-04 DIAGNOSIS — G8929 Other chronic pain: Secondary | ICD-10-CM | POA: Diagnosis not present

## 2020-07-04 DIAGNOSIS — M25611 Stiffness of right shoulder, not elsewhere classified: Secondary | ICD-10-CM | POA: Diagnosis not present

## 2020-07-20 DIAGNOSIS — Z96611 Presence of right artificial shoulder joint: Secondary | ICD-10-CM | POA: Diagnosis not present

## 2020-07-20 DIAGNOSIS — M25611 Stiffness of right shoulder, not elsewhere classified: Secondary | ICD-10-CM | POA: Diagnosis not present

## 2020-07-20 DIAGNOSIS — M6281 Muscle weakness (generalized): Secondary | ICD-10-CM | POA: Diagnosis not present

## 2020-07-20 DIAGNOSIS — G8929 Other chronic pain: Secondary | ICD-10-CM | POA: Diagnosis not present

## 2020-07-20 DIAGNOSIS — M25511 Pain in right shoulder: Secondary | ICD-10-CM | POA: Diagnosis not present

## 2020-07-24 DIAGNOSIS — G8929 Other chronic pain: Secondary | ICD-10-CM | POA: Diagnosis not present

## 2020-07-24 DIAGNOSIS — M6281 Muscle weakness (generalized): Secondary | ICD-10-CM | POA: Diagnosis not present

## 2020-07-24 DIAGNOSIS — M25611 Stiffness of right shoulder, not elsewhere classified: Secondary | ICD-10-CM | POA: Diagnosis not present

## 2020-07-24 DIAGNOSIS — M25511 Pain in right shoulder: Secondary | ICD-10-CM | POA: Diagnosis not present

## 2020-07-24 DIAGNOSIS — Z96611 Presence of right artificial shoulder joint: Secondary | ICD-10-CM | POA: Diagnosis not present

## 2020-07-27 DIAGNOSIS — Z96611 Presence of right artificial shoulder joint: Secondary | ICD-10-CM | POA: Diagnosis not present

## 2020-07-27 DIAGNOSIS — M6281 Muscle weakness (generalized): Secondary | ICD-10-CM | POA: Diagnosis not present

## 2020-07-27 DIAGNOSIS — M25511 Pain in right shoulder: Secondary | ICD-10-CM | POA: Diagnosis not present

## 2020-07-27 DIAGNOSIS — G8929 Other chronic pain: Secondary | ICD-10-CM | POA: Diagnosis not present

## 2020-07-27 DIAGNOSIS — M25611 Stiffness of right shoulder, not elsewhere classified: Secondary | ICD-10-CM | POA: Diagnosis not present

## 2020-07-30 DIAGNOSIS — Z96611 Presence of right artificial shoulder joint: Secondary | ICD-10-CM | POA: Diagnosis not present

## 2020-09-05 DIAGNOSIS — I10 Essential (primary) hypertension: Secondary | ICD-10-CM | POA: Diagnosis not present

## 2020-09-05 DIAGNOSIS — E785 Hyperlipidemia, unspecified: Secondary | ICD-10-CM | POA: Diagnosis not present

## 2020-10-23 DIAGNOSIS — Z7689 Persons encountering health services in other specified circumstances: Secondary | ICD-10-CM | POA: Diagnosis not present

## 2020-10-29 DIAGNOSIS — M12811 Other specific arthropathies, not elsewhere classified, right shoulder: Secondary | ICD-10-CM | POA: Diagnosis not present

## 2020-10-29 DIAGNOSIS — M7581 Other shoulder lesions, right shoulder: Secondary | ICD-10-CM | POA: Diagnosis not present

## 2020-10-29 DIAGNOSIS — Z96611 Presence of right artificial shoulder joint: Secondary | ICD-10-CM | POA: Diagnosis not present

## 2020-10-31 DIAGNOSIS — I10 Essential (primary) hypertension: Secondary | ICD-10-CM | POA: Diagnosis not present

## 2020-10-31 DIAGNOSIS — E785 Hyperlipidemia, unspecified: Secondary | ICD-10-CM | POA: Diagnosis not present

## 2020-11-20 DIAGNOSIS — R3912 Poor urinary stream: Secondary | ICD-10-CM | POA: Diagnosis not present

## 2020-11-30 ENCOUNTER — Other Ambulatory Visit (HOSPITAL_COMMUNITY): Payer: Self-pay | Admitting: Urology

## 2020-11-30 DIAGNOSIS — R9721 Rising PSA following treatment for malignant neoplasm of prostate: Secondary | ICD-10-CM

## 2020-11-30 DIAGNOSIS — E785 Hyperlipidemia, unspecified: Secondary | ICD-10-CM | POA: Diagnosis not present

## 2020-11-30 DIAGNOSIS — I10 Essential (primary) hypertension: Secondary | ICD-10-CM | POA: Diagnosis not present

## 2020-12-19 DIAGNOSIS — I10 Essential (primary) hypertension: Secondary | ICD-10-CM | POA: Diagnosis not present

## 2020-12-21 ENCOUNTER — Encounter (HOSPITAL_COMMUNITY): Payer: Self-pay

## 2020-12-21 ENCOUNTER — Ambulatory Visit (HOSPITAL_COMMUNITY): Payer: Medicare Other

## 2021-01-09 ENCOUNTER — Other Ambulatory Visit: Payer: Self-pay | Admitting: Hematology and Oncology

## 2021-01-09 DIAGNOSIS — C9110 Chronic lymphocytic leukemia of B-cell type not having achieved remission: Secondary | ICD-10-CM

## 2021-01-10 ENCOUNTER — Inpatient Hospital Stay: Payer: Medicare Other

## 2021-01-10 ENCOUNTER — Other Ambulatory Visit: Payer: Self-pay

## 2021-01-10 ENCOUNTER — Inpatient Hospital Stay: Payer: Medicare Other | Attending: Hematology and Oncology | Admitting: Hematology and Oncology

## 2021-01-10 ENCOUNTER — Encounter: Payer: Self-pay | Admitting: Hematology and Oncology

## 2021-01-10 DIAGNOSIS — D696 Thrombocytopenia, unspecified: Secondary | ICD-10-CM | POA: Diagnosis not present

## 2021-01-10 DIAGNOSIS — C9110 Chronic lymphocytic leukemia of B-cell type not having achieved remission: Secondary | ICD-10-CM | POA: Diagnosis not present

## 2021-01-10 DIAGNOSIS — E663 Overweight: Secondary | ICD-10-CM | POA: Diagnosis not present

## 2021-01-10 DIAGNOSIS — Z79899 Other long term (current) drug therapy: Secondary | ICD-10-CM | POA: Insufficient documentation

## 2021-01-10 DIAGNOSIS — Z6825 Body mass index (BMI) 25.0-25.9, adult: Secondary | ICD-10-CM | POA: Insufficient documentation

## 2021-01-10 LAB — CBC WITH DIFFERENTIAL/PLATELET
Abs Immature Granulocytes: 0.02 10*3/uL (ref 0.00–0.07)
Basophils Absolute: 0.1 10*3/uL (ref 0.0–0.1)
Basophils Relative: 1 %
Eosinophils Absolute: 0.1 10*3/uL (ref 0.0–0.5)
Eosinophils Relative: 1 %
HCT: 44.1 % (ref 39.0–52.0)
Hemoglobin: 15.3 g/dL (ref 13.0–17.0)
Immature Granulocytes: 0 %
Lymphocytes Relative: 51 %
Lymphs Abs: 6 10*3/uL — ABNORMAL HIGH (ref 0.7–4.0)
MCH: 31.1 pg (ref 26.0–34.0)
MCHC: 34.7 g/dL (ref 30.0–36.0)
MCV: 89.6 fL (ref 80.0–100.0)
Monocytes Absolute: 0.6 10*3/uL (ref 0.1–1.0)
Monocytes Relative: 5 %
Neutro Abs: 4.8 10*3/uL (ref 1.7–7.7)
Neutrophils Relative %: 42 %
Platelets: 144 10*3/uL — ABNORMAL LOW (ref 150–400)
RBC: 4.92 MIL/uL (ref 4.22–5.81)
RDW: 13.2 % (ref 11.5–15.5)
Smear Review: NORMAL
WBC: 11.6 10*3/uL — ABNORMAL HIGH (ref 4.0–10.5)
nRBC: 0 % (ref 0.0–0.2)

## 2021-01-10 LAB — ABO/RH: ABO/RH(D): AB POS

## 2021-01-10 NOTE — Assessment & Plan Note (Signed)
He has recurrent lymphocytosis again Clinically, he has no signs or symptoms to suggest cancer progression I will see him once a year

## 2021-01-10 NOTE — Progress Notes (Signed)
[facility] OFFICE PROGRESS NOTE  Patient Care Team: Lajean Manes, MD as PCP - General (Internal Medicine) Frederik Pear, MD as Attending Physician (Orthopedic Surgery) Heath Lark, MD as Consulting Physician (Hematology and Oncology)  ASSESSMENT & PLAN:  CLL (chronic lymphocytic leukemia) He has recurrent lymphocytosis again Clinically, he has no signs or symptoms to suggest cancer progression I will see him once a year  Thrombocytopenia ([place]) The cause could be related to his underlying medical condition or fatty liver. It is mild and there is little change compared from previous platelet count. The patient denies recent history of bleeding such as epistaxis, hematuria or hematochezia. He is asymptomatic from the thrombocytopenia. I will observe for now.  he does not require further evaluation or treatment for this   Overweight (BMI 25.0-29.9) We had extensive discussions about the importance of weight loss He has successfully lost some weight since last time I saw him I continue to encourage his weight loss effort  No orders of the defined types were placed in this encounter.   All questions were answered. The patient knows to call the clinic with any problems, questions or concerns. The total time spent in the appointment was 25 minutes encounter with patients including review of chart and various tests results, discussions about plan of care and coordination of care plan   Heath Lark, MD 01/10/2021 10:36 AM  INTERVAL HISTORY: Please see below for problem oriented charting. He returns for further follow-up He is doing well No new lymphadenopathy No recent bleeding or infection   SUMMARY OF ONCOLOGIC HISTORY: Oncology History  CLL (chronic lymphocytic leukemia) ([place])  07/08/2012 Pathology Results   Accession #: [id number]  peripheral blood comfirmed CLL    06/24/2016 Pathology Results   CLL FISH showed 3.67% positive for deletion 13 q and 5% positive for  p53 deletion    01/10/2021 Cancer Staging   Staging form: Chronic Lymphocytic Leukemia / Small Lymphocytic Lymphoma, AJCC 8th Edition - Clinical stage from 01/10/2021: Modified Rai Stage 0 (Modified Rai risk: Low, Lymphocytosis: Present, Adenopathy: Absent, Organomegaly: Absent, Anemia: Absent, Thrombocytopenia: Absent) - Signed by Heath Lark, MD on 01/10/2021  Stage prefix: Initial diagnosis      REVIEW OF SYSTEMS:   Constitutional: Denies fevers, chills or abnormal weight loss Eyes: Denies blurriness of vision Ears, nose, mouth, throat, and face: Denies mucositis or sore throat Respiratory: Denies cough, dyspnea or wheezes Cardiovascular: Denies palpitation, chest discomfort or lower extremity swelling Gastrointestinal:  Denies nausea, heartburn or change in bowel habits Skin: Denies abnormal skin rashes Lymphatics: Denies new lymphadenopathy or easy bruising Neurological:Denies numbness, tingling or new weaknesses Behavioral/Psych: Mood is stable, no new changes  All other systems were reviewed with the patient and are negative.  I have reviewed the past medical history, past surgical history, social history and family history with the patient and they are unchanged from previous note.  ALLERGIES:  has No Known Allergies.  MEDICATIONS:  Current Outpatient Medications  Medication Sig Dispense Refill   amLODipine (NORVASC) 5 MG tablet Take 5 mg by mouth 2 (two) times daily.      carvedilol (COREG) 12.5 MG tablet Take 12.5 mg by mouth 2 (two) times daily with a meal.      Cholecalciferol (VITAMIN D) 50 MCG (2000 UT) tablet Take 2,000 Units by mouth daily.      COVID-19 mRNA vaccine, Pfizer, 30 MCG/0.3ML injection USE AS DIRECTED .3 mL 0   FLUoxetine (PROZAC) 40 MG capsule Take 40 mg by mouth  2 (two) times daily.      hydrochlorothiazide (HYDRODIURIL) 25 MG tablet Take 25 mg by mouth daily.      ketoconazole (NIZORAL) 2 % shampoo Apply 1 application topically daily as needed for  irritation.     lisinopril (PRINIVIL,ZESTRIL) 10 MG tablet Take 10 mg by mouth daily.     Melatonin 10 MG TABS Take 10 mg by mouth at bedtime.     naproxen sodium (ALEVE) 220 MG tablet Take 220 mg by mouth daily as needed (pain).     pravastatin (PRAVACHOL) 10 MG tablet Take 10 mg by mouth daily.     sildenafil (VIAGRA) 100 MG tablet Take 100 mg by mouth daily as needed.      No current facility-administered medications for this visit.    PHYSICAL EXAMINATION: ECOG PERFORMANCE STATUS: 0 - Asymptomatic  Vitals:   01/10/21 0925  BP: (!) 141/86  Pulse: 61  Resp: 18  Temp: 97.7 F (36.5 C)  SpO2: 98%   Filed Weights   01/10/21 0925  Weight: 208 lb 11.2 oz (94.7 kg)    GENERAL:alert, no distress and comfortable SKIN: skin color, texture, turgor are normal, no rashes or significant lesions EYES: normal, Conjunctiva are pink and non-injected, sclera clear OROPHARYNX:no exudate, no erythema and lips, buccal mucosa, and tongue normal  NECK: supple, thyroid normal size, non-tender, without nodularity LYMPH:  no palpable lymphadenopathy in the cervical, axillary or inguinal LUNGS: clear to auscultation and percussion with normal breathing effort HEART: regular rate & rhythm and no murmurs and no lower extremity edema ABDOMEN:abdomen soft, non-tender and normal bowel sounds Musculoskeletal:no cyanosis of digits and no clubbing  NEURO: alert & oriented x 3 with fluent speech, no focal motor/sensory deficits  LABORATORY DATA:  I have reviewed the data as listed    Component Value Date/Time   NA 138 04/23/2020 1319   NA 136 06/24/2016 1014   K 3.7 04/23/2020 1319   K 3.9 06/24/2016 1014   CL 101 04/23/2020 1319   CL 105 10/06/2012 1047   CO2 26 04/23/2020 1319   CO2 24 06/24/2016 1014   GLUCOSE 103 (H) 04/23/2020 1319   GLUCOSE 104 06/24/2016 1014   GLUCOSE 80 10/06/2012 1047   BUN 19 04/23/2020 1319   BUN 17.8 06/24/2016 1014   CREATININE 0.82 04/23/2020 1319   CREATININE  0.9 06/24/2016 1014   CALCIUM 9.2 04/23/2020 1319   CALCIUM 9.2 06/24/2016 1014   PROT 6.5 05/10/2018 1155   PROT 6.5 06/24/2016 1014   ALBUMIN 3.9 05/10/2018 1155   ALBUMIN 3.7 06/24/2016 1014   AST 17 05/10/2018 1155   AST 16 06/24/2016 1014   ALT 19 05/10/2018 1155   ALT 17 06/24/2016 1014   ALKPHOS 42 05/10/2018 1155   ALKPHOS 55 06/24/2016 1014   BILITOT 0.7 05/10/2018 1155   BILITOT 0.51 06/24/2016 1014   GFRNONAA >60 04/23/2020 1319   GFRAA >60 05/10/2018 1155    No results found for: SPEP, UPEP  Lab Results  Component Value Date   WBC 11.6 (H) 01/10/2021   NEUTROABS 4.8 01/10/2021   HGB 15.3 01/10/2021   HCT 44.1 01/10/2021   MCV 89.6 01/10/2021   PLT 144 (L) 01/10/2021      Chemistry      Component Value Date/Time   NA 138 04/23/2020 1319   NA 136 06/24/2016 1014   K 3.7 04/23/2020 1319   K 3.9 06/24/2016 1014   CL 101 04/23/2020 1319   CL 105 10/06/2012 1047  CO2 26 04/23/2020 1319   CO2 24 06/24/2016 1014   BUN 19 04/23/2020 1319   BUN 17.8 06/24/2016 1014   CREATININE 0.82 04/23/2020 1319   CREATININE 0.9 06/24/2016 1014      Component Value Date/Time   CALCIUM 9.2 04/23/2020 1319   CALCIUM 9.2 06/24/2016 1014   ALKPHOS 42 05/10/2018 1155   ALKPHOS 55 06/24/2016 1014   AST 17 05/10/2018 1155   AST 16 06/24/2016 1014   ALT 19 05/10/2018 1155   ALT 17 06/24/2016 1014   BILITOT 0.7 05/10/2018 1155   BILITOT 0.51 06/24/2016 1014

## 2021-01-10 NOTE — Assessment & Plan Note (Signed)
We had extensive discussions about the importance of weight loss He has successfully lost some weight since last time I saw him I continue to encourage his weight loss effort

## 2021-01-10 NOTE — Assessment & Plan Note (Signed)
The cause could be related to his underlying medical condition or fatty liver. It is mild and there is little change compared from previous platelet count. The patient denies recent history of bleeding such as epistaxis, hematuria or hematochezia. He is asymptomatic from the thrombocytopenia. I will observe for now.  he does not require further evaluation or treatment for this

## 2021-01-14 ENCOUNTER — Telehealth: Payer: Self-pay

## 2021-01-14 NOTE — Telephone Encounter (Signed)
Returned called and mailed out copy of 2018 MRI of the Brain. Confirmed address.

## 2021-01-14 NOTE — Telephone Encounter (Signed)
Returned his call. Left a message asking him to call the office back.

## 2021-02-01 DIAGNOSIS — E785 Hyperlipidemia, unspecified: Secondary | ICD-10-CM | POA: Diagnosis not present

## 2021-02-01 DIAGNOSIS — I10 Essential (primary) hypertension: Secondary | ICD-10-CM | POA: Diagnosis not present

## 2021-02-18 DIAGNOSIS — L821 Other seborrheic keratosis: Secondary | ICD-10-CM | POA: Diagnosis not present

## 2021-02-18 DIAGNOSIS — L814 Other melanin hyperpigmentation: Secondary | ICD-10-CM | POA: Diagnosis not present

## 2021-02-18 DIAGNOSIS — Z85828 Personal history of other malignant neoplasm of skin: Secondary | ICD-10-CM | POA: Diagnosis not present

## 2021-02-18 DIAGNOSIS — L570 Actinic keratosis: Secondary | ICD-10-CM | POA: Diagnosis not present

## 2021-02-21 DIAGNOSIS — R3912 Poor urinary stream: Secondary | ICD-10-CM | POA: Diagnosis not present

## 2021-02-21 DIAGNOSIS — R911 Solitary pulmonary nodule: Secondary | ICD-10-CM | POA: Diagnosis not present

## 2021-03-08 ENCOUNTER — Ambulatory Visit
Admission: RE | Admit: 2021-03-08 | Discharge: 2021-03-08 | Disposition: A | Discharge status: Home / Self Care | Payer: Self-pay | Source: Ambulatory Visit | Attending: Radiation Oncology | Admitting: Radiation Oncology

## 2021-03-08 ENCOUNTER — Ambulatory Visit: Payer: Medicare Other | Admitting: Urology

## 2021-03-08 ENCOUNTER — Telehealth: Payer: Self-pay | Admitting: *Deleted

## 2021-03-08 ENCOUNTER — Other Ambulatory Visit: Payer: Self-pay | Admitting: Radiation Oncology

## 2021-03-08 ENCOUNTER — Other Ambulatory Visit: Payer: Self-pay

## 2021-03-08 ENCOUNTER — Encounter: Payer: Self-pay | Admitting: Urology

## 2021-03-08 ENCOUNTER — Ambulatory Visit: Payer: Medicare Other

## 2021-03-08 ENCOUNTER — Ambulatory Visit
Admission: RE | Admit: 2021-03-08 | Discharge: 2021-03-08 | Disposition: A | Discharge status: Home / Self Care | Payer: Medicare Other | Source: Ambulatory Visit | Attending: Radiation Oncology | Admitting: Radiation Oncology

## 2021-03-08 ENCOUNTER — Ambulatory Visit
Admission: RE | Admit: 2021-03-08 | Discharge: 2021-03-08 | Disposition: A | Discharge status: Home / Self Care | Payer: Medicare Other | Source: Ambulatory Visit | Attending: Urology | Admitting: Urology

## 2021-03-08 VITALS — BP 136/63 | HR 66 | Temp 97.7°F | Resp 18 | Ht 72.0 in | Wt 219.6 lb

## 2021-03-08 DIAGNOSIS — Z79899 Other long term (current) drug therapy: Secondary | ICD-10-CM | POA: Insufficient documentation

## 2021-03-08 DIAGNOSIS — C7801 Secondary malignant neoplasm of right lung: Secondary | ICD-10-CM | POA: Diagnosis not present

## 2021-03-08 DIAGNOSIS — C7800 Secondary malignant neoplasm of unspecified lung: Secondary | ICD-10-CM

## 2021-03-08 DIAGNOSIS — E230 Hypopituitarism: Secondary | ICD-10-CM | POA: Diagnosis not present

## 2021-03-08 DIAGNOSIS — R918 Other nonspecific abnormal finding of lung field: Secondary | ICD-10-CM | POA: Insufficient documentation

## 2021-03-08 DIAGNOSIS — C7802 Secondary malignant neoplasm of left lung: Secondary | ICD-10-CM | POA: Insufficient documentation

## 2021-03-08 DIAGNOSIS — Z87891 Personal history of nicotine dependence: Secondary | ICD-10-CM | POA: Diagnosis not present

## 2021-03-08 DIAGNOSIS — C61 Malignant neoplasm of prostate: Secondary | ICD-10-CM

## 2021-03-08 DIAGNOSIS — C9111 Chronic lymphocytic leukemia of B-cell type in remission: Secondary | ICD-10-CM | POA: Insufficient documentation

## 2021-03-08 DIAGNOSIS — M129 Arthropathy, unspecified: Secondary | ICD-10-CM | POA: Diagnosis not present

## 2021-03-08 DIAGNOSIS — I10 Essential (primary) hypertension: Secondary | ICD-10-CM | POA: Insufficient documentation

## 2021-03-08 NOTE — Telephone Encounter (Signed)
CALLED PATIENT TO INFORM OF [place] APPT. WITH DR. Lamonte Sakai ON 04-04-21 - ARRIVAL TIME- 1:45 PM, PHONE - [phone], ADDRESS- [address], AND TELEPHONE FU WITH ASHLYN BRUNING ON 04-17-21 @ 10:30 AM, SPOKE WITH PATIENT AND HE IS AWARE OF THESE APPTS.

## 2021-03-08 NOTE — Progress Notes (Addendum)
Radiation Oncology         [phone] ________________________________  Outpatient Radiation Oncology Follow Up New Consultation  Name: Brian Bean MRN: [id number]  Date: 03/08/2021  DOB: Jul 19, 1940  [id number], Christiane Ha, MD  Ardis Hughs, MD   REFERRING PHYSICIAN: Ardis Hughs, MD  DIAGNOSIS: 80 y.o. gentleman with oligometastatic prostate cancer involving the lungs and a current PSA of 6.3 s/p EBRT in 2019 for stage T1c adenocarcinoma of the prostate with a Gleason's score of 3+3 and a PSA of 16.9 at diagnosis    ICD-10-CM   1. Malignant neoplasm of prostate metastatic to lung [facility])  [place] [facility] referral to Social Work   C78.00       HISTORY OF PRESENT ILLNESS::Brian Bean is a 80 y.o. gentleman.  He was initially diagnosed with Gleason score of 3+3 disease on transrectal ultrasound biopsy on 01/26/14 involving 2/12 core biopsies. He was followed in active surveillance and his PSA continued to rise but subsequent biopsies on 11/01/15 and 05/22/16 revealed stable Gleason 3+3 disease. An MRI of the prostate on 05/16/16 showed no restricted diffusion to suggest high-grade carcinoma and no findings to suggest locally advanced disease. There was bilateral pelvic sidewall adenopathy, felt secondary to his h/o CLL. The patient remained under active surveillance. PSA in January 2018 remained stable at 10.7 but increased to 13.3 in Sept. 2018.  This increase was noted after starting Clomid for treatment of hypogonadism.  PSA increased to 16.9 on 04/07/17 and remained elevated at 16.5 on 05/20/17 despite stopping Clomid.   At the time of follow up with Dr. Louis Meckel  on 05/26/17,  digital rectal examination was performed and revealed no nodularity.  A repeat transrectal ultrasound biopsy of the prostate on 05/26/17 again confirmed stable Gleason 3+3 disease. We initially met with him in multidisciplinary prostate cancer clinic 06/30/2017 for discussion of potential radiation  treatment options and clinical evaluation. Ultimately, he elected to proceed with a 5.5 week course of prostate IMRT but declined ST-ADT. Response to treatment with a PSA nadir at 0.6 in November 2020.  However, by November 2021 the PSA had increased to 2.5 and in May 2022 had further increased to 4.3.  Therefore, the recommendation was to proceed with PSMA PET scan for further evaluation and this exam was performed at [facility] on 12/28/2020 showing 2 hypermetabolic pulmonary nodules with a 1.4 cm right upper lobe nodule and a 1.5 cm left lower lobe nodule but no evidence of lymphadenopathy or local recurrence in the prostate.     He had a repeat PSA on 03/05/2021 which was again further elevated at 6.3.  He has reviewed the labs and imaging results with his urologist and has been kindly referred today for discussion of the potential role for radiotherapy in the management of oligometastatic prostate cancer.  Of note, the patient has a history of CLL and low testosterone with persistant elevation of FSH and LH. His CLL was diagnosed in 2014 and is followed by Dr. Alvy Bimler, felt to be in remission though there was some LAN noted on MRI in 2017.  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  He was evaluated in ENT and advised that this was felt most likely benign based on imaging features.  PREVIOUS RADIATION THERAPY: No  PAST MEDICAL HISTORY:  has a past  medical history of Anxiety, Arthritis, Cancer ([place]), CLL (chronic lymphocytic leukemia) ([place]) (01/06/2013), CLL (chronic lymphocytic leukemia) ([place]), Hypertension, Lymphocytosis, PONV (postoperative nausea and vomiting), and Prostate cancer ([place]).    PAST SURGICAL HISTORY: Past Surgical History:  Procedure Laterality Date   APPENDECTOMY      CATARACT EXTRACTION, BILATERAL     CERVICAL DISC SURGERY  00   COLONOSCOPY  2011   [facility].    FRACTURE SURGERY     rt ankle   HERNIA REPAIR     rt and  undistended testicle   PROSTATE BIOPSY     REVERSE SHOULDER ARTHROPLASTY Right 05/01/2020   Procedure: REVERSE SHOULDER ARTHROPLASTY;  Surgeon: Corky Mull, MD;  Location: [facility] ORS;  Service: Orthopedics;  Laterality: Right;   rt hip  12   pinned   TOTAL HIP ARTHROPLASTY  05/03/2012   Procedure: TOTAL HIP ARTHROPLASTY;  Surgeon: Kerin Salen, MD;  Location: [place];  Service: Orthopedics;  Laterality: Right;    FAMILY HISTORY: family history includes Cancer in his father; Heart disease in his mother.  SOCIAL HISTORY:  reports that he quit smoking about 43 years ago. His smoking use included cigarettes. He has a 5.00 pack-year smoking history. He has never used smokeless tobacco. He reports current alcohol use of about 5.0 standard drinks per week. He reports that he does not use drugs.  ALLERGIES: Patient has no known allergies.  MEDICATIONS:  Current Outpatient Medications  Medication Sig Dispense Refill   amLODipine (NORVASC) 5 MG tablet Take 5 mg by mouth 2 (two) times daily.      carvedilol (COREG) 12.5 MG tablet Take 12.5 mg by mouth 2 (two) times daily with a meal.      Cholecalciferol (VITAMIN D) 50 MCG (2000 UT) tablet Take 2,000 Units by mouth daily.      COVID-19 mRNA vaccine, Pfizer, 30 MCG/0.3ML injection USE AS DIRECTED .3 mL 0   FLUoxetine (PROZAC) 40 MG capsule Take 40 mg by mouth 2 (two) times daily.      hydrochlorothiazide (HYDRODIURIL) 25 MG tablet Take 25 mg by mouth daily.      ketoconazole (NIZORAL) 2 % shampoo Apply 1 application topically daily as needed for irritation.     lisinopril (PRINIVIL,ZESTRIL) 10 MG tablet Take 10 mg by mouth daily.     Melatonin 10 MG TABS Take 10 mg by mouth at bedtime.     naproxen sodium (ALEVE) 220 MG tablet Take 220 mg by mouth daily as needed (pain).     pravastatin  (PRAVACHOL) 10 MG tablet Take 10 mg by mouth daily.     sildenafil (VIAGRA) 100 MG tablet Take 100 mg by mouth daily as needed.      No current facility-administered medications for this encounter.    REVIEW OF SYSTEMS:  On review of systems, the patient reports that he is doing well overall. He reports loss of sleep, fatigue, dental problems, blood in urine, skin cancer, and anxiety. He denies any chest pain, shortness of breath, cough, fevers, chills, night sweats, unintended weight changes. He denies any bowel disturbances, and denies abdominal pain, nausea or vomiting. He denies any new musculoskeletal or joint aches or pains. His IPSS was 9, indicating moderate urinary symptoms. He is able to complete sexual activity with most attempts with the use of Cialis 49m. A complete review of systems is obtained and is otherwise negative.   PHYSICAL EXAM:  Wt Readings from Last 3 Encounters:  03/08/21 219 lb 9.6 oz (99.6 kg)  01/10/21 208 lb 11.2 oz (94.7 kg)  04/23/20 220 lb 3.8 oz (99.9 kg)   Temp Readings from Last 3 Encounters:  03/08/21 97.7 F (36.5 C) (Temporal)  01/10/21 97.7 F (36.5 C) (Tympanic)  05/01/20 (!) 97 F (36.1 C) (Temporal)   BP Readings from Last 3 Encounters:  03/08/21 136/63  01/10/21 (!) 141/86  05/01/20 (!) 156/83   Pulse Readings from Last 3 Encounters:  03/08/21 66  01/10/21 61  05/01/20 87   Pain Assessment Pain Score: 0-No pain/10  In general this is a well appearing Caucasian male in no acute distress.  He's alert and oriented x4 and appropriate throughout the examination. Cardiopulmonary assessment is negative for acute distress and he exhibits normal effort.   KPS = 90  100 - Normal; no complaints; no evidence of disease. 90   - Able to carry on normal activity; minor signs or symptoms of disease. 80   - Normal activity with effort; some signs or symptoms of disease. 47   - Cares for self; unable to carry on normal activity or to do active  work. 60   - Requires occasional assistance, but is able to care for most of his personal needs. 50   - Requires considerable assistance and frequent medical care. 27   - Disabled; requires special care and assistance. 33   - Severely disabled; hospital admission is indicated although death not imminent. 33   - Very sick; hospital admission necessary; active supportive treatment necessary. 10   - Moribund; fatal processes progressing rapidly. 0     - Dead  Karnofsky DA, Abelmann [place], Craver LS and Burchenal [facility] [phone]) The use of the nitrogen mustards in the palliative treatment of carcinoma: with particular reference to bronchogenic carcinoma Cancer 1 [id number]   LABORATORY DATA:  Lab Results  Component Value Date   WBC 11.6 (H) 01/10/2021   HGB 15.3 01/10/2021   HCT 44.1 01/10/2021   MCV 89.6 01/10/2021   PLT 144 (L) 01/10/2021   Lab Results  Component Value Date   NA 138 04/23/2020   K 3.7 04/23/2020   CL 101 04/23/2020   CO2 26 04/23/2020   Lab Results  Component Value Date   ALT 19 05/10/2018   AST 17 05/10/2018   ALKPHOS 42 05/10/2018   BILITOT 0.7 05/10/2018     RADIOGRAPHY: No results found.     IMPRESSION/PLAN: 80 y.o. gentleman with presumed oligometastatic prostate cancer involving the lungs and a current PSA of 6.3 s/p EBRT in 2019 for stage T1c adenocarcinoma of the prostate with a Gleason's score of 3+3 and a PSA of 16.9 at diagnosis.  Today, we talked to the patient and his wife about the findings and workup thus far. We discussed the natural history of recurrent prostate adenocarcinoma and general treatment, highlighting the role of radiotherapy in the management of oligometastatic disease.  We discussed that the lungs are an uncommon site of prostate cancer metastasis, though not unheard of, and despite the findings on PSMA PET which would indicate a high likelihood that this is indeed oligometastatic prostate cancer, the newness of this imaging utility still  leaves room for question regarding definitive diagnosis.  Therefore, we recommend proceeding with bronchoscopic biopsy for tissue confirmation prior to making a final treatment recommendation.  If this is indeed proven to be oligometastatic prostate cancer, we discussed the available radiation techniques, and focused on the details and logistics of stereotactic body radiotherapy (SBRT). We reviewed the anticipated acute and late sequelae  associated with radiation in this setting. The patient was encouraged to ask questions that were answered to his satisfaction.  At the conclusion of our conversation, the patient is in agreement with proceeding with bronchoscopic biopsy of the bilateral lung lesions for tissue confirmation.  We will make a referral to pulmonology and try to get this procedure expedited as much as possible.  We will plan to follow-up with the patient by telephone once the final pathology is available, to review these results and further discuss treatment recommendations at that time.  He appears to have a good understanding of his disease and our recommendations and is comfortable and in agreement with the stated plan.  We will share our discussion with Dr. Louis Meckel and look forward to continuing to participate in his care.  He knows that he is welcome to call at anytime in the interim with any questions or concerns related to radiation.   We personally spent 60 minutes in this encounter including chart review, reviewing radiological studies, meeting face-to-face with the patient, entering orders and completing documentation.    Nicholos Johns, PA-C    Tyler Pita, MD  [facility] Direct Dial: [phone]  Fax: [phone] [email or web address]  Skype  [email or web address]

## 2021-03-08 NOTE — Progress Notes (Signed)
Patient reports mild fatigue. No other symptoms reported at this time. Flomax taken as directed. Patient states "No urology follow-up at this time, but that he will call as soon as possible to schedule".  I-PSS Score of 9 (moderate).  Meaningful use complete.  BP 136/63 (BP Location: Left Arm, Patient Position: Sitting, Cuff Size: Normal)   Pulse 66   Temp 97.7 F (36.5 C) (Temporal)   Resp 18   Ht 6' (1.829 m)   Wt 219 lb 9.6 oz (99.6 kg)   SpO2 98%   BMI 29.78 kg/m

## 2021-03-11 ENCOUNTER — Encounter: Payer: Self-pay | Admitting: *Deleted

## 2021-03-11 NOTE — Progress Notes (Signed)
[place] Psychosocial Distress Screening Clinical Social Work  Clinical Social Work was referred by distress screening protocol.  The patient scored a 5 on the Psychosocial Distress Thermometer which indicates moderate distress. Clinical Social Worker contacted patient by phone to assess for distress and other psychosocial needs.  Patient stated he was doing well, and taking things one step at a time.  Patient stated he has an appointment with his PCP tomorrow and hope to get additional information.  CSW provided education on CSW role and support services at [facility].  Patient dis not express any concerns at this time, and was appreciative of CSW contact.  CSW provided contact information and encouraged patient to call with questions or concerns.    ON[facility]N DISTRESS SCREENING 03/08/2021  Screening Type   Distress experienced in past week (1-10) 5  Emotional problem type Adjusting to illness  Physical Problem type   Referral to support programs     Johnnye Lana, MSW, LCSW, OSW-C Clinical Social Worker [place] [phone]

## 2021-03-12 DIAGNOSIS — E7800 Pure hypercholesterolemia, unspecified: Secondary | ICD-10-CM | POA: Diagnosis not present

## 2021-03-12 DIAGNOSIS — R739 Hyperglycemia, unspecified: Secondary | ICD-10-CM | POA: Diagnosis not present

## 2021-03-12 DIAGNOSIS — R918 Other nonspecific abnormal finding of lung field: Secondary | ICD-10-CM | POA: Diagnosis not present

## 2021-03-26 ENCOUNTER — Institutional Professional Consult (permissible substitution): Payer: Medicare Other | Admitting: Internal Medicine

## 2021-04-04 ENCOUNTER — Institutional Professional Consult (permissible substitution): Payer: Medicare Other | Admitting: Emergency Medicine

## 2021-04-04 ENCOUNTER — Other Ambulatory Visit: Payer: Self-pay

## 2021-04-04 ENCOUNTER — Ambulatory Visit (INDEPENDENT_AMBULATORY_CARE_PROVIDER_SITE_OTHER): Payer: Medicare Other | Admitting: Pulmonary Disease

## 2021-04-04 ENCOUNTER — Encounter: Payer: Self-pay | Admitting: Pulmonary Disease

## 2021-04-04 ENCOUNTER — Telehealth: Payer: Self-pay | Admitting: Pulmonary Disease

## 2021-04-04 VITALS — BP 124/76 | HR 66 | Temp 98.3°F | Ht 72.0 in | Wt 217.4 lb

## 2021-04-04 DIAGNOSIS — C61 Malignant neoplasm of prostate: Secondary | ICD-10-CM

## 2021-04-04 DIAGNOSIS — R918 Other nonspecific abnormal finding of lung field: Secondary | ICD-10-CM | POA: Diagnosis not present

## 2021-04-04 DIAGNOSIS — C9110 Chronic lymphocytic leukemia of B-cell type not having achieved remission: Secondary | ICD-10-CM

## 2021-04-04 DIAGNOSIS — C7800 Secondary malignant neoplasm of unspecified lung: Secondary | ICD-10-CM | POA: Diagnosis not present

## 2021-04-04 NOTE — Progress Notes (Signed)
Synopsis: Referred in October 2020 due to for multiple pulmonary nodules by Freeman Caldron, PA-C  Subjective:   PATIENT ID: Brian Bean GENDER: male DOB: July 12, 1940, MRN: [id number]  Chief Complaint  Patient presents with   Consult    This is a 80 year old gentleman past medical history of CLL, prostate cancer.  Patient had follow-up PET scan PSMA that was completed at [facility].  This was done in July 2022.  He had follow-up images and consultation with Dr. Tammi Klippel and Salley Scarlet.  There was concern for oligometastatic prostate cancer to the lungs.  He had hypermetabolic nodules within the lungs measuring at 1.4 cm in the right upper lobe and 1.5 cm in the left lower lobe.  He had a repeat PSA in September which was again further elevated at 6.3.  Patient was referred for evaluation of bronchoscopy and tissue sampling of the lung nodules.  Patient is on no blood thinners or antiplatelets at this time.  He does take a 81 mg daily aspirin.  Of note he has had general anesthesia before and with gas anesthesia has postoperative nausea.   Past Medical History:  Diagnosis Date   Anxiety    Arthritis    Cancer ([place])    skin lesion scalp   CLL (chronic lymphocytic leukemia) ([place]) 01/06/2013   CLL (chronic lymphocytic leukemia) ([facility])    Hypertension    Lymphocytosis    PONV (postoperative nausea and vomiting)    extremely sick did not get sick after hip surgery in 2013   Prostate cancer [facility])      Family History  Problem Relation Age of Onset   Heart disease Mother    Cancer Father        unk type of cancer     Past Surgical History:  Procedure Laterality Date   APPENDECTOMY     CATARACT EXTRACTION, BILATERAL     CERVICAL DISC SURGERY  00   COLONOSCOPY  2011   [facility].    FRACTURE SURGERY     rt ankle   HERNIA REPAIR     rt and  undistended testicle   PROSTATE BIOPSY     REVERSE SHOULDER ARTHROPLASTY Right 05/01/2020   Procedure: REVERSE SHOULDER ARTHROPLASTY;  Surgeon:  Corky Mull, MD;  Location: [facility] ORS;  Service: Orthopedics;  Laterality: Right;   rt hip  12   pinned   TOTAL HIP ARTHROPLASTY  05/03/2012   Procedure: TOTAL HIP ARTHROPLASTY;  Surgeon: Kerin Salen, MD;  Location: [place];  Service: Orthopedics;  Laterality: Right;    Social History   Socioeconomic History   Marital status: Legally Separated    Spouse name: Not on file   Number of children: 1   Years of education: Not on file   Highest education level: Not on file  Occupational History    Comment: retired Copywriter, advertising ([employer]  Tobacco Use   Smoking status: Former    Packs/day: 0.50    Years: 10.00    Pack years: 5.00    Types: Cigarettes    Quit date: 04/28/1977    Years since quitting: 43.9   Smokeless tobacco: Never   Tobacco comments:    10 oz alcohol wkly  Vaping Use   Vaping Use: Never used  Substance and Sexual Activity   Alcohol use: Yes    Alcohol/week: 5.0 standard drinks    Types: 5 Glasses of wine per week   Drug use: No   Sexual activity: Yes  Other Topics  Concern   Not on file  Social History Narrative   Retired.   Significant other: Nicanor Bake   One son, Saralyn Pilar   Social Determinants of Health   Financial Resource Strain: Not on [employer] Insecurity: Not on file  Transportation Needs: Not on file  Physical Activity: Not on file  Stress: Not on file  Social Connections: Not on file  Intimate Partner Violence: Not on file     No Known Allergies   Outpatient Medications Prior to Visit  Medication Sig Dispense Refill   amLODipine (NORVASC) 5 MG tablet Take 5 mg by mouth 2 (two) times daily.      carvedilol (COREG) 12.5 MG tablet Take 12.5 mg by mouth 2 (two) times daily with a meal.      Cholecalciferol (VITAMIN D) 50 MCG (2000 UT) tablet Take 2,000 Units by mouth daily.      FLUoxetine (PROZAC) 40 MG capsule Take 40 mg by mouth 2 (two) times daily.      hydrochlorothiazide (HYDRODIURIL) 25 MG tablet Take 25 mg by mouth daily.       ketoconazole (NIZORAL) 2 % shampoo Apply 1 application topically daily as needed for irritation.     lisinopril (PRINIVIL,ZESTRIL) 10 MG tablet Take 10 mg by mouth daily.     Melatonin 10 MG TABS Take 10 mg by mouth at bedtime.     naproxen sodium (ALEVE) 220 MG tablet Take 220 mg by mouth daily as needed (pain).     pravastatin (PRAVACHOL) 10 MG tablet Take 10 mg by mouth daily.     sildenafil (VIAGRA) 100 MG tablet Take 100 mg by mouth daily as needed.      No facility-administered medications prior to visit.    Review of Systems  Constitutional:  Negative for chills, fever, malaise/fatigue and weight loss.  HENT:  Negative for hearing loss, sore throat and tinnitus.   Eyes:  Negative for blurred vision and double vision.  Respiratory:  Negative for cough, hemoptysis, sputum production, shortness of breath, wheezing and stridor.   Cardiovascular:  Negative for chest pain, palpitations, orthopnea, leg swelling and PND.  Gastrointestinal:  Negative for abdominal pain, constipation, diarrhea, heartburn, nausea and vomiting.  Genitourinary:  Negative for dysuria, hematuria and urgency.  Musculoskeletal:  Negative for joint pain and myalgias.  Skin:  Negative for itching and rash.  Neurological:  Negative for dizziness, tingling, weakness and headaches.  Endo/Heme/Allergies:  Negative for environmental allergies. Does not bruise/bleed easily.  Psychiatric/Behavioral:  Negative for depression. The patient is not nervous/anxious and does not have insomnia.   All other systems reviewed and are negative.   Objective:  Physical Exam Vitals reviewed.  Constitutional:      General: He is not in acute distress.    Appearance: He is well-developed. He is obese.  HENT:     Head: Normocephalic and atraumatic.  Eyes:     General: No scleral icterus.    Conjunctiva/sclera: Conjunctivae normal.     Pupils: Pupils are equal, round, and reactive to light.  Neck:     Vascular: No JVD.      Trachea: No tracheal deviation.  Cardiovascular:     Rate and Rhythm: Normal rate and regular rhythm.     Heart sounds: Normal heart sounds. No murmur heard. Pulmonary:     Effort: Pulmonary effort is normal. No tachypnea, accessory muscle usage or respiratory distress.     Breath sounds: No stridor. No wheezing, rhonchi or rales.  Abdominal:  General: Bowel sounds are normal. There is no distension.     Palpations: Abdomen is soft.     Tenderness: There is no abdominal tenderness.  Musculoskeletal:        General: No tenderness.     Cervical back: Neck supple.  Lymphadenopathy:     Cervical: No cervical adenopathy.  Skin:    General: Skin is warm and dry.     Capillary Refill: Capillary refill takes less than 2 seconds.     Findings: No rash.  Neurological:     Mental Status: He is alert and oriented to person, place, and time.  Psychiatric:        Behavior: Behavior normal.     Vitals:   04/04/21 1001  BP: 124/76  Pulse: 66  Temp: 98.3 F (36.8 C)  TempSrc: Oral  SpO2: 97%  Weight: 217 lb 6.4 oz (98.6 kg)  Height: 6' (1.829 m)   97% on  RA BMI Readings from Last 3 Encounters:  04/04/21 29.48 kg/m  03/08/21 29.78 kg/m  01/10/21 28.30 kg/m   Wt Readings from Last 3 Encounters:  04/04/21 217 lb 6.4 oz (98.6 kg)  03/08/21 219 lb 9.6 oz (99.6 kg)  01/10/21 208 lb 11.2 oz (94.7 kg)     CBC    Component Value Date/Time   WBC 11.6 (H) 01/10/2021 0905   RBC 4.92 01/10/2021 0905   HGB 15.3 01/10/2021 0905   HGB 14.7 06/24/2016 1013   HCT 44.1 01/10/2021 0905   HCT 43.8 06/24/2016 1013   PLT 144 (L) 01/10/2021 0905   PLT 157 06/24/2016 1013   MCV 89.6 01/10/2021 0905   MCV 91.0 06/24/2016 1013   MCH 31.1 01/10/2021 0905   MCHC 34.7 01/10/2021 0905   RDW 13.2 01/10/2021 0905   RDW 14.1 06/24/2016 1013   LYMPHSABS 6.0 (H) 01/10/2021 0905   LYMPHSABS 12.0 (H) 06/24/2016 1013   MONOABS 0.6 01/10/2021 0905   MONOABS 0.7 06/24/2016 1013   EOSABS 0.1  01/10/2021 0905   EOSABS 0.1 06/24/2016 1013   BASOSABS 0.1 01/10/2021 0905   BASOSABS 0.1 06/24/2016 1013    Chest Imaging: Nuclear medicine pet imaging July 2022 from [facility]: Images have been uploaded and reviewed.  He does have hypermetabolic lower lobe nodules on the right as well as most on the left. We reviewed these with the patient today in the office. The patient's images have been independently reviewed by me.    Pulmonary Functions Testing Results: No flowsheet data found.  FeNO:   Pathology:   Echocardiogram:   Heart Catheterization:     Assessment & Plan:     ICD-10-CM   1. Lung nodules  R91.8 Procedural/ Surgical Case Request: VIDEO BRONCHOSCOPY WITH ENDOBRONCHIAL NAVIGATION    Ambulatory referral to Pulmonology    CT Super D Chest Wo Contrast    2. Malignant neoplasm of prostate metastatic to lung ([place])  C61    C78.00     3. Prostate cancer ([place])  C61     4. CLL (chronic lymphocytic leukemia) ([place])  C91.10       Discussion:  This is a 80 year old gentleman, bilateral pulmonary nodules, prostate cancer undergoing treatment.  Recently seen radiation oncology.  Review of images and discussion has led to consideration for bronchoscopy and tissue biopsy.  Plan: We discussed in the office today the risk benefits and alternatives of proceeding with navigational bronchoscopy and tissue sampling of the pulmonary nodules. Patient is agreeable to proceed. We discussed the risk of  bleeding and pneumothorax as well as the pros and cons from TTNA versus R AB. Patient will need a super D CT scan prior to bronchoscopy. I have ordered this. Tentative bronchoscopy date on 04/19/2021. We appreciate PCC's help with scheduling and prior authorization.   Current Outpatient Medications:    amLODipine (NORVASC) 5 MG tablet, Take 5 mg by mouth 2 (two) times daily. , Disp: , Rfl:    carvedilol (COREG) 12.5 MG tablet, Take 12.5 mg by mouth 2 (two) times daily with a meal. ,  Disp: , Rfl:    Cholecalciferol (VITAMIN D) 50 MCG (2000 UT) tablet, Take 2,000 Units by mouth daily. , Disp: , Rfl:    FLUoxetine (PROZAC) 40 MG capsule, Take 40 mg by mouth 2 (two) times daily. , Disp: , Rfl:    hydrochlorothiazide (HYDRODIURIL) 25 MG tablet, Take 25 mg by mouth daily. , Disp: , Rfl:    ketoconazole (NIZORAL) 2 % shampoo, Apply 1 application topically daily as needed for irritation., Disp: , Rfl:    lisinopril (PRINIVIL,ZESTRIL) 10 MG tablet, Take 10 mg by mouth daily., Disp: , Rfl:    Melatonin 10 MG TABS, Take 10 mg by mouth at bedtime., Disp: , Rfl:    naproxen sodium (ALEVE) 220 MG tablet, Take 220 mg by mouth daily as needed (pain)., Disp: , Rfl:    pravastatin (PRAVACHOL) 10 MG tablet, Take 10 mg by mouth daily., Disp: , Rfl:    sildenafil (VIAGRA) 100 MG tablet, Take 100 mg by mouth daily as needed. , Disp: , Rfl:   I spent 63 minutes dedicated to the care of this patient on the date of this encounter to include pre-visit review of records, face-to-face time with the patient discussing conditions above, post visit ordering of testing, clinical documentation with the electronic health record, making appropriate referrals as documented, and communicating necessary findings to members of the patients care team.   Garner Nash, DO [facility] 04/04/2021 10:31 AM

## 2021-04-04 NOTE — H&P (View-Only) (Signed)
Synopsis: Referred in October 2020 due to for multiple pulmonary nodules by Freeman Caldron, PA-C  Subjective:   PATIENT ID: Brian Bean GENDER: male DOB: 1940-08-30, MRN: [id number]  Chief Complaint  Patient presents with   Consult    This is a 80 year old gentleman past medical history of CLL, prostate cancer.  Patient had follow-up PET scan PSMA that was completed at [facility].  This was done in July 2022.  He had follow-up images and consultation with Dr. Tammi Klippel and Salley Scarlet.  There was concern for oligometastatic prostate cancer to the lungs.  He had hypermetabolic nodules within the lungs measuring at 1.4 cm in the right upper lobe and 1.5 cm in the left lower lobe.  He had a repeat PSA in September which was again further elevated at 6.3.  Patient was referred for evaluation of bronchoscopy and tissue sampling of the lung nodules.  Patient is on no blood thinners or antiplatelets at this time.  He does take a 81 mg daily aspirin.  Of note he has had general anesthesia before and with gas anesthesia has postoperative nausea.   Past Medical History:  Diagnosis Date   Anxiety    Arthritis    Cancer ([place])    skin lesion scalp   CLL (chronic lymphocytic leukemia) ([place]) 01/06/2013   CLL (chronic lymphocytic leukemia) ([facility])    Hypertension    Lymphocytosis    PONV (postoperative nausea and vomiting)    extremely sick did not get sick after hip surgery in 2013   Prostate cancer [facility])      Family History  Problem Relation Age of Onset   Heart disease Mother    Cancer Father        unk type of cancer     Past Surgical History:  Procedure Laterality Date   APPENDECTOMY     CATARACT EXTRACTION, BILATERAL     CERVICAL DISC SURGERY  00   COLONOSCOPY  2011   [facility].    FRACTURE SURGERY     rt ankle   HERNIA REPAIR     rt and  undistended testicle   PROSTATE BIOPSY     REVERSE SHOULDER ARTHROPLASTY Right 05/01/2020   Procedure: REVERSE SHOULDER ARTHROPLASTY;  Surgeon:  Corky Mull, MD;  Location: [facility] ORS;  Service: Orthopedics;  Laterality: Right;   rt hip  12   pinned   TOTAL HIP ARTHROPLASTY  05/03/2012   Procedure: TOTAL HIP ARTHROPLASTY;  Surgeon: Kerin Salen, MD;  Location: [place];  Service: Orthopedics;  Laterality: Right;    Social History   Socioeconomic History   Marital status: Legally Separated    Spouse name: Not on file   Number of children: 1   Years of education: Not on file   Highest education level: Not on file  Occupational History    Comment: retired Copywriter, advertising ([employer]  Tobacco Use   Smoking status: Former    Packs/day: 0.50    Years: 10.00    Pack years: 5.00    Types: Cigarettes    Quit date: 04/28/1977    Years since quitting: 43.9   Smokeless tobacco: Never   Tobacco comments:    10 oz alcohol wkly  Vaping Use   Vaping Use: Never used  Substance and Sexual Activity   Alcohol use: Yes    Alcohol/week: 5.0 standard drinks    Types: 5 Glasses of wine per week   Drug use: No   Sexual activity: Yes  Other Topics  Concern   Not on file  Social History Narrative   Retired.   Significant other: Nicanor Bake   One son, Saralyn Pilar   Social Determinants of Health   Financial Resource Strain: Not on [employer] Insecurity: Not on file  Transportation Needs: Not on file  Physical Activity: Not on file  Stress: Not on file  Social Connections: Not on file  Intimate Partner Violence: Not on file     No Known Allergies   Outpatient Medications Prior to Visit  Medication Sig Dispense Refill   amLODipine (NORVASC) 5 MG tablet Take 5 mg by mouth 2 (two) times daily.      carvedilol (COREG) 12.5 MG tablet Take 12.5 mg by mouth 2 (two) times daily with a meal.      Cholecalciferol (VITAMIN D) 50 MCG (2000 UT) tablet Take 2,000 Units by mouth daily.      FLUoxetine (PROZAC) 40 MG capsule Take 40 mg by mouth 2 (two) times daily.      hydrochlorothiazide (HYDRODIURIL) 25 MG tablet Take 25 mg by mouth daily.       ketoconazole (NIZORAL) 2 % shampoo Apply 1 application topically daily as needed for irritation.     lisinopril (PRINIVIL,ZESTRIL) 10 MG tablet Take 10 mg by mouth daily.     Melatonin 10 MG TABS Take 10 mg by mouth at bedtime.     naproxen sodium (ALEVE) 220 MG tablet Take 220 mg by mouth daily as needed (pain).     pravastatin (PRAVACHOL) 10 MG tablet Take 10 mg by mouth daily.     sildenafil (VIAGRA) 100 MG tablet Take 100 mg by mouth daily as needed.      No facility-administered medications prior to visit.    Review of Systems  Constitutional:  Negative for chills, fever, malaise/fatigue and weight loss.  HENT:  Negative for hearing loss, sore throat and tinnitus.   Eyes:  Negative for blurred vision and double vision.  Respiratory:  Negative for cough, hemoptysis, sputum production, shortness of breath, wheezing and stridor.   Cardiovascular:  Negative for chest pain, palpitations, orthopnea, leg swelling and PND.  Gastrointestinal:  Negative for abdominal pain, constipation, diarrhea, heartburn, nausea and vomiting.  Genitourinary:  Negative for dysuria, hematuria and urgency.  Musculoskeletal:  Negative for joint pain and myalgias.  Skin:  Negative for itching and rash.  Neurological:  Negative for dizziness, tingling, weakness and headaches.  Endo/Heme/Allergies:  Negative for environmental allergies. Does not bruise/bleed easily.  Psychiatric/Behavioral:  Negative for depression. The patient is not nervous/anxious and does not have insomnia.   All other systems reviewed and are negative.   Objective:  Physical Exam Vitals reviewed.  Constitutional:      General: He is not in acute distress.    Appearance: He is well-developed. He is obese.  HENT:     Head: Normocephalic and atraumatic.  Eyes:     General: No scleral icterus.    Conjunctiva/sclera: Conjunctivae normal.     Pupils: Pupils are equal, round, and reactive to light.  Neck:     Vascular: No JVD.      Trachea: No tracheal deviation.  Cardiovascular:     Rate and Rhythm: Normal rate and regular rhythm.     Heart sounds: Normal heart sounds. No murmur heard. Pulmonary:     Effort: Pulmonary effort is normal. No tachypnea, accessory muscle usage or respiratory distress.     Breath sounds: No stridor. No wheezing, rhonchi or rales.  Abdominal:  General: Bowel sounds are normal. There is no distension.     Palpations: Abdomen is soft.     Tenderness: There is no abdominal tenderness.  Musculoskeletal:        General: No tenderness.     Cervical back: Neck supple.  Lymphadenopathy:     Cervical: No cervical adenopathy.  Skin:    General: Skin is warm and dry.     Capillary Refill: Capillary refill takes less than 2 seconds.     Findings: No rash.  Neurological:     Mental Status: He is alert and oriented to person, place, and time.  Psychiatric:        Behavior: Behavior normal.     Vitals:   04/04/21 1001  BP: 124/76  Pulse: 66  Temp: 98.3 F (36.8 C)  TempSrc: Oral  SpO2: 97%  Weight: 217 lb 6.4 oz (98.6 kg)  Height: 6' (1.829 m)   97% on  RA BMI Readings from Last 3 Encounters:  04/04/21 29.48 kg/m  03/08/21 29.78 kg/m  01/10/21 28.30 kg/m   Wt Readings from Last 3 Encounters:  04/04/21 217 lb 6.4 oz (98.6 kg)  03/08/21 219 lb 9.6 oz (99.6 kg)  01/10/21 208 lb 11.2 oz (94.7 kg)     CBC    Component Value Date/Time   WBC 11.6 (H) 01/10/2021 0905   RBC 4.92 01/10/2021 0905   HGB 15.3 01/10/2021 0905   HGB 14.7 06/24/2016 1013   HCT 44.1 01/10/2021 0905   HCT 43.8 06/24/2016 1013   PLT 144 (L) 01/10/2021 0905   PLT 157 06/24/2016 1013   MCV 89.6 01/10/2021 0905   MCV 91.0 06/24/2016 1013   MCH 31.1 01/10/2021 0905   MCHC 34.7 01/10/2021 0905   RDW 13.2 01/10/2021 0905   RDW 14.1 06/24/2016 1013   LYMPHSABS 6.0 (H) 01/10/2021 0905   LYMPHSABS 12.0 (H) 06/24/2016 1013   MONOABS 0.6 01/10/2021 0905   MONOABS 0.7 06/24/2016 1013   EOSABS 0.1  01/10/2021 0905   EOSABS 0.1 06/24/2016 1013   BASOSABS 0.1 01/10/2021 0905   BASOSABS 0.1 06/24/2016 1013    Chest Imaging: Nuclear medicine pet imaging July 2022 from [facility]: Images have been uploaded and reviewed.  He does have hypermetabolic lower lobe nodules on the right as well as most on the left. We reviewed these with the patient today in the office. The patient's images have been independently reviewed by me.    Pulmonary Functions Testing Results: No flowsheet data found.  FeNO:   Pathology:   Echocardiogram:   Heart Catheterization:     Assessment & Plan:     ICD-10-CM   1. Lung nodules  R91.8 Procedural/ Surgical Case Request: VIDEO BRONCHOSCOPY WITH ENDOBRONCHIAL NAVIGATION    Ambulatory referral to Pulmonology    CT Super D Chest Wo Contrast    2. Malignant neoplasm of prostate metastatic to lung ([place])  C61    C78.00     3. Prostate cancer ([place])  C61     4. CLL (chronic lymphocytic leukemia) ([place])  C91.10       Discussion:  This is a 80 year old gentleman, bilateral pulmonary nodules, prostate cancer undergoing treatment.  Recently seen radiation oncology.  Review of images and discussion has led to consideration for bronchoscopy and tissue biopsy.  Plan: We discussed in the office today the risk benefits and alternatives of proceeding with navigational bronchoscopy and tissue sampling of the pulmonary nodules. Patient is agreeable to proceed. We discussed the risk of  bleeding and pneumothorax as well as the pros and cons from TTNA versus R AB. Patient will need a super D CT scan prior to bronchoscopy. I have ordered this. Tentative bronchoscopy date on 04/19/2021. We appreciate PCC's help with scheduling and prior authorization.   Current Outpatient Medications:    amLODipine (NORVASC) 5 MG tablet, Take 5 mg by mouth 2 (two) times daily. , Disp: , Rfl:    carvedilol (COREG) 12.5 MG tablet, Take 12.5 mg by mouth 2 (two) times daily with a meal. ,  Disp: , Rfl:    Cholecalciferol (VITAMIN D) 50 MCG (2000 UT) tablet, Take 2,000 Units by mouth daily. , Disp: , Rfl:    FLUoxetine (PROZAC) 40 MG capsule, Take 40 mg by mouth 2 (two) times daily. , Disp: , Rfl:    hydrochlorothiazide (HYDRODIURIL) 25 MG tablet, Take 25 mg by mouth daily. , Disp: , Rfl:    ketoconazole (NIZORAL) 2 % shampoo, Apply 1 application topically daily as needed for irritation., Disp: , Rfl:    lisinopril (PRINIVIL,ZESTRIL) 10 MG tablet, Take 10 mg by mouth daily., Disp: , Rfl:    Melatonin 10 MG TABS, Take 10 mg by mouth at bedtime., Disp: , Rfl:    naproxen sodium (ALEVE) 220 MG tablet, Take 220 mg by mouth daily as needed (pain)., Disp: , Rfl:    pravastatin (PRAVACHOL) 10 MG tablet, Take 10 mg by mouth daily., Disp: , Rfl:    sildenafil (VIAGRA) 100 MG tablet, Take 100 mg by mouth daily as needed. , Disp: , Rfl:   I spent 63 minutes dedicated to the care of this patient on the date of this encounter to include pre-visit review of records, face-to-face time with the patient discussing conditions above, post visit ordering of testing, clinical documentation with the electronic health record, making appropriate referrals as documented, and communicating necessary findings to members of the patients care team.   Garner Nash, DO [facility] 04/04/2021 10:31 AM

## 2021-04-04 NOTE — Telephone Encounter (Signed)
Sched pt for 11/11 at 7:30 at [facility].  CT will be at [facility] on 11/8 & pt will go for covid test on 11/8.  Gave appt info to pt.

## 2021-04-08 DIAGNOSIS — E785 Hyperlipidemia, unspecified: Secondary | ICD-10-CM | POA: Diagnosis not present

## 2021-04-08 DIAGNOSIS — E7800 Pure hypercholesterolemia, unspecified: Secondary | ICD-10-CM | POA: Diagnosis not present

## 2021-04-08 DIAGNOSIS — I10 Essential (primary) hypertension: Secondary | ICD-10-CM | POA: Diagnosis not present

## 2021-04-15 ENCOUNTER — Other Ambulatory Visit: Payer: Self-pay

## 2021-04-15 ENCOUNTER — Encounter (HOSPITAL_COMMUNITY): Payer: Self-pay | Admitting: Pulmonary Disease

## 2021-04-15 ENCOUNTER — Telehealth: Payer: Self-pay | Admitting: Pulmonary Disease

## 2021-04-15 NOTE — Progress Notes (Signed)
[name] denies chest pain or shortness of breath.  Patient denies having any s/s of Covid in his household.  Patient denies any known exposure to Covid.  [name] reports that he became very sick when he was given anesthesia with a mask. Patient reports that he has not had any issues with the last 2 procedures that were done at [facility].  [name] has to go to [place]  office at [address] at 0800 for a CT scan, then he will come to the hospital.  PCP is Dr. Lajean Manes.    [name]  will go to a [place] for CT scan at 0800, then he will come to the hospital.  I instructed patient to shower with antibiotic soap, if it is available.  Dry off with a clean towel. Do not put lotion, powder, cologne or deodorant or makeup.No jewelry or piercings. Men may shave their face and neck. Woman should not shave. No nail polish, artificial or acrylic nails. Wear clean clothes, brush your teeth. Glasses, contact lens,dentures or partials may not be worn in the OR. If you need to wear them, please bring a case for glasses, do not wear contacts or bring a case, the hospital does not have contact cases, dentures or partials will have to be removed , make sure they are clean, we will provide a denture cup to put them in. You will need some one to drive you home and a responsible person over the age of 44 to stay with you for the first 24 hours after surgery.Marland Kitchen

## 2021-04-16 ENCOUNTER — Ambulatory Visit (HOSPITAL_COMMUNITY): Payer: Medicare Other

## 2021-04-16 ENCOUNTER — Other Ambulatory Visit (HOSPITAL_COMMUNITY): Payer: Self-pay | Admitting: Pulmonary Disease

## 2021-04-16 ENCOUNTER — Ambulatory Visit (HOSPITAL_COMMUNITY)
Admission: RE | Admit: 2021-04-16 | Discharge: 2021-04-16 | Disposition: A | Discharge status: Home / Self Care | Payer: Medicare Other | Attending: Pulmonary Disease | Admitting: Pulmonary Disease

## 2021-04-16 ENCOUNTER — Encounter: Payer: Self-pay | Admitting: Urology

## 2021-04-16 ENCOUNTER — Other Ambulatory Visit: Payer: Self-pay

## 2021-04-16 ENCOUNTER — Inpatient Hospital Stay: Admission: RE | Admit: 2021-04-16 | Payer: Medicare Other | Source: Ambulatory Visit

## 2021-04-16 ENCOUNTER — Ambulatory Visit (HOSPITAL_COMMUNITY): Payer: Medicare Other | Admitting: Anesthesiology

## 2021-04-16 ENCOUNTER — Encounter (HOSPITAL_COMMUNITY): Payer: Self-pay | Admitting: Pulmonary Disease

## 2021-04-16 ENCOUNTER — Encounter (HOSPITAL_COMMUNITY)
Admission: RE | Disposition: A | Discharge status: Home / Self Care | Payer: Self-pay | Source: Home / Self Care | Attending: Pulmonary Disease

## 2021-04-16 DIAGNOSIS — F419 Anxiety disorder, unspecified: Secondary | ICD-10-CM | POA: Insufficient documentation

## 2021-04-16 DIAGNOSIS — Z7982 Long term (current) use of aspirin: Secondary | ICD-10-CM | POA: Diagnosis not present

## 2021-04-16 DIAGNOSIS — Z856 Personal history of leukemia: Secondary | ICD-10-CM | POA: Diagnosis not present

## 2021-04-16 DIAGNOSIS — D696 Thrombocytopenia, unspecified: Secondary | ICD-10-CM | POA: Diagnosis not present

## 2021-04-16 DIAGNOSIS — C61 Malignant neoplasm of prostate: Secondary | ICD-10-CM | POA: Insufficient documentation

## 2021-04-16 DIAGNOSIS — Z87891 Personal history of nicotine dependence: Secondary | ICD-10-CM | POA: Diagnosis not present

## 2021-04-16 DIAGNOSIS — C7800 Secondary malignant neoplasm of unspecified lung: Secondary | ICD-10-CM | POA: Diagnosis not present

## 2021-04-16 DIAGNOSIS — C7801 Secondary malignant neoplasm of right lung: Secondary | ICD-10-CM | POA: Insufficient documentation

## 2021-04-16 DIAGNOSIS — Z9889 Other specified postprocedural states: Secondary | ICD-10-CM

## 2021-04-16 DIAGNOSIS — M1990 Unspecified osteoarthritis, unspecified site: Secondary | ICD-10-CM | POA: Insufficient documentation

## 2021-04-16 DIAGNOSIS — C3411 Malignant neoplasm of upper lobe, right bronchus or lung: Secondary | ICD-10-CM | POA: Diagnosis not present

## 2021-04-16 DIAGNOSIS — Z419 Encounter for procedure for purposes other than remedying health state, unspecified: Secondary | ICD-10-CM

## 2021-04-16 DIAGNOSIS — R918 Other nonspecific abnormal finding of lung field: Secondary | ICD-10-CM | POA: Diagnosis not present

## 2021-04-16 DIAGNOSIS — F32A Depression, unspecified: Secondary | ICD-10-CM | POA: Insufficient documentation

## 2021-04-16 DIAGNOSIS — Z79899 Other long term (current) drug therapy: Secondary | ICD-10-CM | POA: Diagnosis not present

## 2021-04-16 DIAGNOSIS — R911 Solitary pulmonary nodule: Secondary | ICD-10-CM | POA: Diagnosis not present

## 2021-04-16 DIAGNOSIS — I700 Atherosclerosis of aorta: Secondary | ICD-10-CM | POA: Diagnosis not present

## 2021-04-16 DIAGNOSIS — Z20822 Contact with and (suspected) exposure to covid-19: Secondary | ICD-10-CM | POA: Diagnosis not present

## 2021-04-16 DIAGNOSIS — I10 Essential (primary) hypertension: Secondary | ICD-10-CM | POA: Diagnosis not present

## 2021-04-16 DIAGNOSIS — J439 Emphysema, unspecified: Secondary | ICD-10-CM | POA: Diagnosis not present

## 2021-04-16 DIAGNOSIS — C3431 Malignant neoplasm of lower lobe, right bronchus or lung: Secondary | ICD-10-CM | POA: Diagnosis not present

## 2021-04-16 DIAGNOSIS — I517 Cardiomegaly: Secondary | ICD-10-CM | POA: Diagnosis not present

## 2021-04-16 HISTORY — PX: BRONCHIAL NEEDLE ASPIRATION BIOPSY: SHX5106

## 2021-04-16 HISTORY — PX: BRONCHIAL WASHINGS: SHX5105

## 2021-04-16 HISTORY — DX: Depression, unspecified: F32.A

## 2021-04-16 HISTORY — PX: VIDEO BRONCHOSCOPY WITH RADIAL ENDOBRONCHIAL ULTRASOUND: SHX6849

## 2021-04-16 HISTORY — PX: BRONCHIAL BRUSHINGS: SHX5108

## 2021-04-16 HISTORY — DX: Unspecified injury of head, initial encounter: S09.90XA

## 2021-04-16 HISTORY — PX: BRONCHIAL BIOPSY: SHX5109

## 2021-04-16 HISTORY — PX: VIDEO BRONCHOSCOPY WITH ENDOBRONCHIAL NAVIGATION: SHX6175

## 2021-04-16 LAB — CBC
HCT: 40.8 % (ref 39.0–52.0)
Hemoglobin: 13.4 g/dL (ref 13.0–17.0)
MCH: 30.9 pg (ref 26.0–34.0)
MCHC: 32.8 g/dL (ref 30.0–36.0)
MCV: 94.2 fL (ref 80.0–100.0)
Platelets: 139 10*3/uL — ABNORMAL LOW (ref 150–400)
RBC: 4.33 MIL/uL (ref 4.22–5.81)
RDW: 13.9 % (ref 11.5–15.5)
WBC: 9.1 10*3/uL (ref 4.0–10.5)
nRBC: 0 % (ref 0.0–0.2)

## 2021-04-16 LAB — BASIC METABOLIC PANEL
Anion gap: 9 (ref 5–15)
BUN: 18 mg/dL (ref 8–23)
CO2: 23 mmol/L (ref 22–32)
Calcium: 8.7 mg/dL — ABNORMAL LOW (ref 8.9–10.3)
Chloride: 103 mmol/L (ref 98–111)
Creatinine, Ser: 0.85 mg/dL (ref 0.61–1.24)
GFR, Estimated: >60 mL/min (ref >60)
Glucose, Bld: 107 mg/dL — ABNORMAL HIGH (ref 70–99)
Potassium: 4.2 mmol/L (ref 3.5–5.1)
Sodium: 135 mmol/L (ref 135–145)

## 2021-04-16 LAB — SARS CORONAVIRUS 2 BY RT PCR (HOSPITAL ORDER, PERFORMED IN ~~LOC~~ HOSPITAL LAB): SARS Coronavirus 2: NEGATIVE

## 2021-04-16 SURGERY — VIDEO BRONCHOSCOPY WITH ENDOBRONCHIAL NAVIGATION
Anesthesia: General | Laterality: Right

## 2021-04-16 MED ORDER — ACETAMINOPHEN 500 MG PO TABS: 1000.0000 mg | ORAL_TABLET | Freq: Once | ORAL | Status: AC

## 2021-04-16 MED ORDER — APREPITANT 40 MG PO CAPS: 40.0000 mg | ORAL_CAPSULE | Freq: Once | ORAL | Status: DC

## 2021-04-16 MED ORDER — SUGAMMADEX SODIUM 200 MG/2ML IV SOLN
INTRAVENOUS | Status: DC | PRN
  Administered 2021-04-16: 200 mg via INTRAVENOUS

## 2021-04-16 MED ORDER — ONDANSETRON HCL 4 MG/2ML IJ SOLN: 4.0000 mg | Freq: Once | INTRAMUSCULAR | Status: DC | PRN

## 2021-04-16 MED ORDER — LIDOCAINE 2% (20 MG/ML) 5 ML SYRINGE
INTRAMUSCULAR | Status: DC | PRN
  Administered 2021-04-16: 80 mg via INTRAVENOUS

## 2021-04-16 MED ORDER — LACTATED RINGERS IV SOLN: INTRAVENOUS | Status: DC

## 2021-04-16 MED ORDER — FENTANYL CITRATE (PF) 100 MCG/2ML IJ SOLN
INTRAMUSCULAR | Status: DC | PRN
  Administered 2021-04-16: 100 ug via INTRAVENOUS

## 2021-04-16 MED ORDER — ROCURONIUM BROMIDE 10 MG/ML (PF) SYRINGE
PREFILLED_SYRINGE | INTRAVENOUS | Status: DC | PRN
  Administered 2021-04-16: 30 mg via INTRAVENOUS
  Administered 2021-04-16: 70 mg via INTRAVENOUS

## 2021-04-16 MED ORDER — FENTANYL CITRATE (PF) 100 MCG/2ML IJ SOLN: 25.0000 ug | INTRAMUSCULAR | Status: DC | PRN

## 2021-04-16 MED ORDER — APREPITANT 40 MG PO CAPS: 40.0000 mg | ORAL_CAPSULE | Freq: Once | ORAL | Status: AC

## 2021-04-16 MED ORDER — PROPOFOL 10 MG/ML IV BOLUS
INTRAVENOUS | Status: DC | PRN
  Administered 2021-04-16: 170 mg via INTRAVENOUS

## 2021-04-16 MED ORDER — APREPITANT 40 MG PO CAPS
ORAL_CAPSULE | ORAL | Status: AC
  Administered 2021-04-16: 40 mg via ORAL
  Filled 2021-04-16: qty 1

## 2021-04-16 MED ORDER — ACETAMINOPHEN 500 MG PO TABS
ORAL_TABLET | ORAL | Status: AC
  Administered 2021-04-16: 1000 mg via ORAL
  Filled 2021-04-16: qty 2

## 2021-04-16 MED ORDER — ONDANSETRON HCL 4 MG/2ML IJ SOLN
INTRAMUSCULAR | Status: DC | PRN
  Administered 2021-04-16: 4 mg via INTRAVENOUS

## 2021-04-16 MED ORDER — PROPOFOL 500 MG/50ML IV EMUL
INTRAVENOUS | Status: DC | PRN
  Administered 2021-04-16: 125 ug/kg/min via INTRAVENOUS

## 2021-04-16 MED ORDER — CHLORHEXIDINE GLUCONATE 0.12 % MT SOLN
OROMUCOSAL | Status: AC
  Administered 2021-04-16: 15 mL
  Filled 2021-04-16: qty 15

## 2021-04-16 SURGICAL SUPPLY — 46 items
ADAPTER BRONCH F/PENTAX (ADAPTER) ×4 IMPLANT
ADAPTER VALVE BIOPSY EBUS (MISCELLANEOUS) IMPLANT
ADPR BSCP EDG PNTX (ADAPTER) ×2
ADPTR VALVE BIOPSY EBUS (MISCELLANEOUS)
BRUSH CYTOL CELLEBRITY 1.5X140 (MISCELLANEOUS) ×4 IMPLANT
BRUSH SUPERTRAX BIOPSY (INSTRUMENTS) IMPLANT
BRUSH SUPERTRAX NDL-TIP CYTO (INSTRUMENTS) ×4 IMPLANT
CANISTER SUCT 3000ML PPV (MISCELLANEOUS) ×4 IMPLANT
CHANNEL WORK EXTEND EDGE 180 (KITS) IMPLANT
CHANNEL WORK EXTEND EDGE 45 (KITS) IMPLANT
CHANNEL WORK EXTEND EDGE 90 (KITS) IMPLANT
CONT SPEC 4OZ CLIKSEAL STRL BL (MISCELLANEOUS) ×4 IMPLANT
COVER BACK TABLE 60X90IN (DRAPES) ×4 IMPLANT
FILTER STRAW FLUID ASPIR (MISCELLANEOUS) IMPLANT
FORCEPS BIOP SUPERTRX PREMAR (INSTRUMENTS) ×4 IMPLANT
GAUZE SPONGE 4X4 12PLY STRL (GAUZE/BANDAGES/DRESSINGS) ×4 IMPLANT
GLOVE SURG SS PI 7.5 STRL IVOR (GLOVE) ×8 IMPLANT
GOWN STRL REUS W/ TWL LRG LVL3 (GOWN DISPOSABLE) ×4 IMPLANT
GOWN STRL REUS W/TWL LRG LVL3 (GOWN DISPOSABLE) ×8
KIT CLEAN ENDO COMPLIANCE (KITS) ×4 IMPLANT
KIT LOCATABLE GUIDE (CANNULA) IMPLANT
KIT MARKER FIDUCIAL DELIVERY (KITS) IMPLANT
KIT PROCEDURE EDGE 180 (KITS) IMPLANT
KIT PROCEDURE EDGE 45 (KITS) IMPLANT
KIT PROCEDURE EDGE 90 (KITS) IMPLANT
KIT TURNOVER KIT B (KITS) ×4 IMPLANT
MARKER SKIN DUAL TIP RULER LAB (MISCELLANEOUS) ×4 IMPLANT
NEEDLE SUPERTRX PREMARK BIOPSY (NEEDLE) ×4 IMPLANT
NS IRRIG 1000ML POUR BTL (IV SOLUTION) ×4 IMPLANT
OIL SILICONE PENTAX (PARTS (SERVICE/REPAIRS)) ×4 IMPLANT
PAD ARMBOARD 7.5X6 YLW CONV (MISCELLANEOUS) ×8 IMPLANT
PATCHES PATIENT (LABEL) ×12 IMPLANT
SOL ANTI FOG 6CC (MISCELLANEOUS) ×2 IMPLANT
SOLUTION ANTI FOG 6CC (MISCELLANEOUS) ×2
SYR 20CC LL (SYRINGE) ×4 IMPLANT
SYR 20ML ECCENTRIC (SYRINGE) ×4 IMPLANT
SYR 50ML SLIP (SYRINGE) ×4 IMPLANT
TOWEL OR 17X24 6PK STRL BLUE (TOWEL DISPOSABLE) ×4 IMPLANT
TRAP SPECIMEN MUCOUS 40CC (MISCELLANEOUS) IMPLANT
TUBE CONNECTING 20'X1/4 (TUBING) ×1
TUBE CONNECTING 20X1/4 (TUBING) ×3 IMPLANT
UNDERPAD 30X30 (UNDERPADS AND DIAPERS) ×4 IMPLANT
VALVE BIOPSY  SINGLE USE (MISCELLANEOUS) ×2
VALVE BIOPSY SINGLE USE (MISCELLANEOUS) ×2 IMPLANT
VALVE SUCTION BRONCHIO DISP (MISCELLANEOUS) ×4 IMPLANT
WATER STERILE IRR 1000ML POUR (IV SOLUTION) ×4 IMPLANT

## 2021-04-16 NOTE — Telephone Encounter (Signed)
Spoke with the pt  He asked about CT from today and if it was compared to last PET  I assured him that the CT from today was compared to PET in July 2022 by radiologist  He wants to have Dr Valeta Harms review the scan asap and call him with results  Please advise, thanks!

## 2021-04-16 NOTE — Discharge Instructions (Signed)
Flexible Bronchoscopy, Care After This sheet gives you information about how to care for yourself after your test. Your doctor may also give you more specific instructions. If you have problems or questions, contact your doctor. Follow these instructions at home: Eating and drinking Do not eat or drink anything (not even water) for 2 hours after your test, or until your numbing medicine (local anesthetic) wears off. When your numbness is gone and your cough and gag reflexes have come back, you may: Eat only soft foods. Slowly drink liquids. The day after the test, go back to your normal diet. Driving Do not drive for 24 hours if you were given a medicine to help you relax (sedative). Do not drive or use heavy machinery while taking prescription pain medicine. General instructions  Take over-the-counter and prescription medicines only as told by your doctor. Return to your normal activities as told. Ask what activities are safe for you. Do not use any products that have nicotine or tobacco in them. This includes cigarettes and e-cigarettes. If you need help quitting, ask your doctor. Keep all follow-up visits as told by your doctor. This is important. It is very important if you had a tissue sample (biopsy) taken. Get help right away if: You have shortness of breath that gets worse. You get light-headed. You feel like you are going to pass out (faint). You have chest pain. You cough up: More than a little blood. More blood than before. Summary Do not eat or drink anything (not even water) for 2 hours after your test, or until your numbing medicine wears off. Do not use cigarettes. Do not use e-cigarettes. Get help right away if you have chest pain.  This information is not intended to replace advice given to you by your health care provider. Make sure you discuss any questions you have with your health care provider. Document Released: 03/23/2009 Document Revised: 05/08/2017 Document  Reviewed: 06/13/2016 Elsevier Patient Education  2020 [employer].

## 2021-04-16 NOTE — Op Note (Addendum)
Video Bronchoscopy with Robotic Assisted Bronchoscopic Navigation   Date of Operation: 04/16/2021   Pre-op Diagnosis: Multiple pulmonary nodules  Post-op Diagnosis: Multiple pulmonary nodules  Surgeon: Garner Nash, DO   Assistants: None   Anesthesia: General endotracheal anesthesia  Operation: Flexible video fiberoptic bronchoscopy with robotic assistance and biopsies.  Estimated Blood Loss: Minimal  Complications: None  Indications and History: Brian Bean is a 80 y.o. male with history of multiple pulmonary nodules. The risks, benefits, complications, treatment options and expected outcomes were discussed with the patient.  The possibilities of pneumothorax, pneumonia, reaction to medication, pulmonary aspiration, perforation of a viscus, bleeding, failure to diagnose a condition and creating a complication requiring transfusion or operation were discussed with the patient who freely signed the consent.    Description of Procedure: The patient was seen in the Preoperative Area, was examined and was deemed appropriate to proceed.  The patient was taken to [facility] endoscopy room 3, identified as Brian Bean and the procedure verified as Flexible Video Fiberoptic Bronchoscopy.  A Time Out was held and the above information confirmed.   Prior to the date of the procedure a high-resolution CT scan of the chest was performed. Utilizing ION software program a virtual tracheobronchial tree was generated to allow the creation of distinct navigation pathways to the patient's parenchymal abnormalities. After being taken to the operating room general anesthesia was initiated and the patient  was orally intubated. The video fiberoptic bronchoscope was introduced via the endotracheal tube and a general inspection was performed which showed normal right and left lung anatomy, aspiration of the bilateral mainstems was completed to remove any remaining secretions. Robotic catheter inserted into  patient's endotracheal tube.   Target #1 RLL lung nodule: The distinct navigation pathways prepared prior to this procedure were then utilized to navigate to patient's lesion identified on CT scan. The robotic catheter was secured into place and the vision probe was withdrawn.  Lesion location was approximated using fluoroscopy and radial endobronchial ultrasound for peripheral targeting.  Nodule was located using 3D cone beam CT imaging and catheter was directed to lesion with elevated software integration with Ion system. Under fluoroscopic guidance transbronchial needle brushings, transbronchial needle biopsies, and transbronchial forceps biopsies were performed to be sent for cytology and pathology.  Target #2 RUL lung nodule: The distinct navigation pathways prepared prior to this procedure were then utilized to navigate to patient's lesion identified on CT scan. The robotic catheter was secured into place and the vision probe was withdrawn.  Lesion location was approximated using fluoroscopy and radial endobronchial ultrasound for peripheral targeting.  Lesion was located using 3D cone beam CT imaging and catheter was redirected and the lesion updated within the system. Under fluoroscopic guidance transbronchial needle brushings, transbronchial needle biopsies, and transbronchial forceps biopsies were performed to be sent for cytology and pathology. A bronchioalveolar lavage was performed in the RUL nodule and sent for cytology.  At the end of the procedure a general airway inspection was performed and there was no evidence of active bleeding. The bronchoscope was removed.  The patient tolerated the procedure well. There was no significant blood loss and there were no obvious complications. A post-procedural chest x-ray is pending.  Samples Target #1: 1. Transbronchial needle brushings from RLL nodule  2. Transbronchial Wang needle biopsies from RLL nodule  3. Transbronchial forceps biopsies from  RLL nodule   Samples Target #2: 1. Transbronchial needle brushings from RUL nodule  2. Transbronchial Wang needle biopsies from RUL nodule  3. Transbronchial forceps biopsies from RUL nodule 4. Bronchoalveolar lavage from RUL nodule   Plans:  The patient will be discharged from the PACU to home when recovered from anesthesia and after chest x-ray is reviewed. We will review the cytology, pathology and microbiology results with the patient when they become available. Outpatient followup will be with Garner Nash, DO.  Garner Nash, DO [facility] 04/16/2021 1:17 PM

## 2021-04-16 NOTE — Interval H&P Note (Signed)
History and Physical Interval Note:  04/16/2021 11:28 AM  Brian Bean  has presented today for surgery, with the diagnosis of lung nodules.  The various methods of treatment have been discussed with the patient and family. After consideration of risks, benefits and other options for treatment, the patient has consented to  Procedure(s) with comments: [place] (Right) - ION w/ CIOS as a surgical intervention.  The patient's history has been reviewed, patient examined, no change in status, stable for surgery.  I have reviewed the patient's chart and labs.  Questions were answered to the patient's satisfaction.     [facility]

## 2021-04-16 NOTE — Progress Notes (Addendum)
Patient states doing well. No symptoms reported at this time.  I-PSS Score of 2 (mild). Meaningful use complete.  Currently on Flomax 0.4mg  as directed. No urology follow-up scheduled as of yet -per [facility].  Patient notified of 10:30am-04/17/21 telephone appointment and verbalized understanding.

## 2021-04-16 NOTE — Anesthesia Procedure Notes (Signed)
Procedure Name: Intubation Date/Time: 04/16/2021 11:46 AM Performed by: Kyung Rudd, CRNA Pre-anesthesia Checklist: Patient identified, Emergency Drugs available, Suction available and Patient being monitored Patient Re-evaluated:Patient Re-evaluated prior to induction Oxygen Delivery Method: Circle system utilized Preoxygenation: Pre-oxygenation with 100% oxygen Induction Type: IV induction Ventilation: Mask ventilation without difficulty Laryngoscope Size: Mac and 4 Grade View: Grade I Tube type: Oral Tube size: 8.5 mm Number of attempts: 1 Airway Equipment and Method: Stylet Placement Confirmation: ETT inserted through vocal cords under direct vision, positive ETCO2 and breath sounds checked- equal and bilateral Secured at: 22 cm Tube secured with: Tape Dental Injury: Teeth and Oropharynx as per pre-operative assessment

## 2021-04-16 NOTE — Anesthesia Postprocedure Evaluation (Signed)
Anesthesia Post Note  Patient: Pranay Hilbun  Procedure(s) Performed: VIDEO BRONCHOSCOPY WITH ENDOBRONCHIAL NAVIGATION (Right) RADIAL ENDOBRONCHIAL ULTRASOUND BRONCHIAL BIOPSIES BRONCHIAL NEEDLE ASPIRATION BIOPSIES BRONCHIAL BRUSHINGS BRONCHIAL WASHINGS     Patient location during evaluation: PACU Anesthesia Type: General Level of consciousness: awake and alert Pain management: pain level controlled Vital Signs Assessment: post-procedure vital signs reviewed and stable Respiratory status: spontaneous breathing, nonlabored ventilation, respiratory function stable and patient connected to nasal cannula oxygen Cardiovascular status: blood pressure returned to baseline and stable Postop Assessment: no apparent nausea or vomiting Anesthetic complications: no   No notable events documented.  Last Vitals:  Vitals:   04/16/21 1421 04/16/21 1449  BP: (!) 148/79 (!) 144/75  Pulse: 69 66  Resp: 20 18  Temp:  36.8 C  SpO2: 96% 96%    Last Pain:  Vitals:   04/16/21 1449  TempSrc:   PainSc: 0-No pain                 Belenda Cruise P Reyana Leisey

## 2021-04-16 NOTE — Anesthesia Preprocedure Evaluation (Addendum)
Anesthesia Evaluation  Patient identified by MRN, date of birth, ID band Patient awake    Reviewed: Allergy & Precautions, Patient's Chart, lab work & pertinent test results, reviewed documented beta blocker date and time   History of Anesthesia Complications (+) PONV and history of anesthetic complications  Airway Mallampati: II  TM Distance: >3 FB Neck ROM: Full    Dental no notable dental hx.    Pulmonary former smoker,  Former smoker, quit 1978, 5 pack year history  Lung nodules   Pulmonary exam normal        Cardiovascular hypertension, Pt. on medications and Pt. on home beta blockers  Rhythm:Regular Rate:Normal     Neuro/Psych PSYCHIATRIC DISORDERS Anxiety Depression    GI/Hepatic negative GI ROS, (+)     substance abuse  alcohol use,   Endo/Other  negative endocrine ROS  Renal/GU negative Renal ROS  negative genitourinary   Musculoskeletal  (+) Arthritis , Osteoarthritis,    Abdominal Normal abdominal exam  (+)   Peds  Hematology Hx CLL 2014   Anesthesia Other Findings   Reproductive/Obstetrics negative OB ROS                            Anesthesia Physical Anesthesia Plan  ASA: 3  Anesthesia Plan: General   Post-op Pain Management:    Induction: Intravenous  PONV Risk Score and Plan: 3 and Ondansetron, Dexamethasone, Aprepitant, Treatment may vary due to age or medical condition, Metaclopromide and Diphenhydramine  Airway Management Planned: Oral ETT and Mask  Additional Equipment: None  Intra-op Plan:   Post-operative Plan: Extubation in OR  Informed Consent: I have reviewed the patients History and Physical, chart, labs and discussed the procedure including the risks, benefits and alternatives for the proposed anesthesia with the patient or authorized representative who has indicated his/her understanding and acceptance.     Dental advisory given  Plan  Discussed with: CRNA  Anesthesia Plan Comments:        Anesthesia Quick Evaluation

## 2021-04-16 NOTE — Transfer of Care (Signed)
Immediate Anesthesia Transfer of Care Note  Patient: Brian Bean  Procedure(s) Performed: VIDEO BRONCHOSCOPY WITH ENDOBRONCHIAL NAVIGATION (Right) RADIAL ENDOBRONCHIAL ULTRASOUND BRONCHIAL BIOPSIES BRONCHIAL NEEDLE ASPIRATION BIOPSIES BRONCHIAL BRUSHINGS BRONCHIAL WASHINGS  Patient Location: PACU  Anesthesia Type:General  Level of Consciousness: awake and patient cooperative  Airway & Oxygen Therapy: Patient Spontanous Breathing and Patient connected to nasal cannula oxygen  Post-op Assessment: Report given to RN, Post -op Vital signs reviewed and stable and Patient moving all extremities  Post vital signs: Reviewed and stable  Last Vitals:  Vitals Value Taken Time  BP 157/85 04/16/21 1320  Temp    Pulse 71 04/16/21 1321  Resp 29 04/16/21 1321  SpO2 98 % 04/16/21 1321  Vitals shown include unvalidated device data.  Last Pain:  Vitals:   04/16/21 0921  TempSrc:   PainSc: 0-No pain      Patients Stated Pain Goal: 0 (67/12/45 8099)  Complications: No notable events documented.

## 2021-04-17 ENCOUNTER — Ambulatory Visit
Admission: RE | Admit: 2021-04-17 | Discharge: 2021-04-17 | Disposition: A | Discharge status: Home / Self Care | Payer: Medicare Other | Source: Ambulatory Visit | Attending: Urology | Admitting: Urology

## 2021-04-17 ENCOUNTER — Encounter: Payer: Self-pay | Admitting: Urology

## 2021-04-17 NOTE — Progress Notes (Addendum)
Patient states doing well. No symptoms reported at this time.   I-PSS Score of 2 (mild). Meaningful use complete.   Currently on Flomax 0.4mg  as directed. No urology follow-up scheduled as of yet -per [facility].   Patient notified that telephone appointment has been rescheduled to 2:00pm on 04/23/21 and verbalized understanding.

## 2021-04-19 ENCOUNTER — Telehealth: Payer: Self-pay

## 2021-04-19 DIAGNOSIS — R918 Other nonspecific abnormal finding of lung field: Secondary | ICD-10-CM | POA: Insufficient documentation

## 2021-04-19 NOTE — Telephone Encounter (Signed)
Returned call to patient who had appointment questions. Patient would like for his 2:00pm 04/23/21 telephone appointment changed to 4:00pm. I told him that we would do our best to oblige him.

## 2021-04-23 ENCOUNTER — Ambulatory Visit
Admission: RE | Admit: 2021-04-23 | Discharge: 2021-04-23 | Disposition: A | Discharge status: Home / Self Care | Payer: Medicare Other | Source: Ambulatory Visit | Attending: Urology | Admitting: Urology

## 2021-04-23 LAB — CYTOLOGY - NON PAP

## 2021-04-23 NOTE — Progress Notes (Signed)
Team,   All called and spoke with the patient regarding positive pathology results consistent with non-small cell malignancy.  I spoke with pathologist.  Prostate specific markers are still pending. Seeing rad onc tomorrow.   Thanks,  BLI  Garner Nash, DO [facility] 04/23/2021 4:22 PM

## 2021-04-24 ENCOUNTER — Inpatient Hospital Stay
Admission: RE | Admit: 2021-04-24 | Discharge: 2021-04-24 | Disposition: A | Discharge status: Home / Self Care | Payer: Medicare Other | Source: Ambulatory Visit | Attending: Urology | Admitting: Urology

## 2021-04-24 ENCOUNTER — Encounter: Payer: Self-pay | Admitting: Urology

## 2021-04-24 DIAGNOSIS — C61 Malignant neoplasm of prostate: Secondary | ICD-10-CM

## 2021-04-24 DIAGNOSIS — C7800 Secondary malignant neoplasm of unspecified lung: Secondary | ICD-10-CM | POA: Diagnosis not present

## 2021-04-24 LAB — CYTOLOGY - NON PAP

## 2021-04-24 NOTE — Progress Notes (Signed)
Patient states doing well. No symptoms reported at this time.   I-PSS Score of 2 (mild). Meaningful use complete.   Currently on Flomax 0.4mg  as directed. No urology follow-up scheduled as of yet -per [facility].   Patient notified that telephone appointment has been rescheduled to 2:00pm on 04/23/21 and verbalized understanding.

## 2021-04-24 NOTE — Progress Notes (Addendum)
Radiation Oncology         [phone]) [phone] ________________________________  Outpatient Radiation Oncology Follow Up- Conducted via Telephone to spare the patient unnecessary potential exposure in the healthcare setting during the current COVID-19 pandemic.  The patient was notified in advance and gave permission to proceed with this visit format.  Name: Brian Bean MRN: [id number]  Date: 04/24/2021  DOB: July 24, 1940  [id number], Christiane Ha, MD  Ardis Hughs, MD   REFERRING PHYSICIAN: Ardis Hughs, MD  DIAGNOSIS: 80 y.o. gentleman with oligometastatic prostate cancer involving the lungs and a current PSA of 6.3 s/p EBRT in 2019 for stage T1c adenocarcinoma of the prostate with a Gleason's score of 3+3 and a PSA of 16.9 at diagnosis    ICD-10-CM   1. Malignant neoplasm of prostate metastatic to lung [facility])  C61    C78.00       HISTORY OF PRESENT ILLNESS: Brian Bean is a 80 y.o. gentleman.  He was initially diagnosed with Gleason score of 3+3 disease on transrectal ultrasound biopsy on 01/26/14 involving 2/12 core biopsies. He was followed in active surveillance and his PSA continued to rise but subsequent biopsies on 11/01/15 and 05/22/16 revealed stable Gleason 3+3 disease. An MRI of the prostate on 05/16/16 showed no restricted diffusion to suggest high-grade carcinoma and no findings to suggest locally advanced disease. There was bilateral pelvic sidewall adenopathy, felt secondary to his h/o CLL. The patient remained under active surveillance. PSA in January 2018 remained stable at 10.7 but increased to 13.3 in Sept. 2018.  This increase was noted after starting Clomid for treatment of hypogonadism.  PSA increased to 16.9 on 04/07/17 and remained elevated at 16.5 on 05/20/17 despite stopping Clomid.   At the time of follow up with Dr. Louis Meckel  on 05/26/17,  digital rectal examination was performed and revealed no nodularity.  A repeat transrectal ultrasound biopsy of the prostate  on 05/26/17 again confirmed stable Gleason 3+3 disease. We initially met with him in multidisciplinary prostate cancer clinic 06/30/2017 for discussion of potential radiation treatment options and clinical evaluation. Ultimately, he elected to proceed with a 5.5 week course of prostate IMRT but declined ST-ADT. Response to treatment with a PSA nadir at 0.6 in November 2020.  However, by November 2021 the PSA had increased to 2.5 and in May 2022 had further increased to 4.3.  Therefore, the recommendation was to proceed with PSMA PET scan for further evaluation and this exam was performed at [facility] on 12/28/2020 showing 2 hypermetabolic pulmonary nodules with a 1.4 cm right upper lobe nodule and a 1.5 cm left lower lobe nodule but no evidence of lymphadenopathy or local recurrence in the prostate.     He had a repeat PSA on 03/05/2021 which was again further elevated at 6.3.  He reviewed the labs and imaging results with his urologist and was kindly referred to [place] on 03/08/21 to discuss the potential role for radiotherapy in the management of oligometastatic prostate cancer.  Because the lungs are an uncommon site of prostate cancer metastasis, though not unheard of, and despite the findings on PSMA PET which would indicate a high likelihood that this is indeed oligometastatic prostate cancer, the newness of this imaging utility still leaves room for question regarding definitive diagnosis so we recommended proceeding with bronchoscopic biopsy for tissue confirmation prior to making a final treatment recommendation.  He had his bronchoscopy under the care of Dr. Valeta Harms on 04/16/21 which confirmed metastatic prostate cancer in both sampled lesions in  the right upper and lower lobes.  We discussed these results today as well as treatment recommendations.  Of note, the patient has a history of CLL and low testosterone with persistant elevation of FSH and LH. His CLL was diagnosed in 2014 and is followed by Dr.  Alvy Bimler, felt to be in remission though there was some LAN noted on MRI in 2017.  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  He was evaluated in ENT and advised that this was felt most likely benign based on imaging features.  PREVIOUS RADIATION THERAPY: No  PAST MEDICAL HISTORY:  has a past medical history of Anxiety, Arthritis, Cancer ([place]), CLL (chronic lymphocytic leukemia) ([place]) (01/06/2013), CLL (chronic lymphocytic leukemia) ([place]), Depression, Head injury, Hypertension, Lymphocytosis, PONV (postoperative nausea and vomiting), and Prostate cancer ([place]).    PAST SURGICAL HISTORY: Past Surgical History:  Procedure Laterality Date   APPENDECTOMY     BRONCHIAL BIOPSY  04/16/2021   Procedure: BRONCHIAL BIOPSIES;  Surgeon: Garner Nash, DO;  Location: [place] [facility];  Service: Pulmonary;;   BRONCHIAL BRUSHINGS  04/16/2021   Procedure: BRONCHIAL BRUSHINGS;  Surgeon: Garner Nash, DO;  Location: [place] [facility];  Service: Pulmonary;;   BRONCHIAL NEEDLE ASPIRATION BIOPSY  04/16/2021   Procedure: BRONCHIAL NEEDLE ASPIRATION BIOPSIES;  Surgeon: Garner Nash, DO;  Location: [place] [facility];  Service: Pulmonary;;   BRONCHIAL WASHINGS  04/16/2021   Procedure: BRONCHIAL WASHINGS;  Surgeon: Garner Nash, DO;  Location: [place] [facility];  Service: Pulmonary;;   CATARACT EXTRACTION, BILATERAL     CERVICAL DISC SURGERY  00   COLONOSCOPY  2011   [place]     rt ankle   HERNIA REPAIR     rt and  undistended testicle   PROSTATE BIOPSY     REVERSE SHOULDER ARTHROPLASTY Right 05/01/2020   Procedure: REVERSE SHOULDER ARTHROPLASTY;  Surgeon: Corky Mull, MD;  Location: [facility];  Service: Orthopedics;  Laterality: Right;   rt hip  12   pinned   TOTAL HIP  ARTHROPLASTY  05/03/2012   Procedure: TOTAL HIP ARTHROPLASTY;  Surgeon: Kerin Salen, MD;  Location: [place];  Service: Orthopedics;  Laterality: Right;   VIDEO BRONCHOSCOPY WITH ENDOBRONCHIAL NAVIGATION Right 04/16/2021   Procedure: VIDEO BRONCHOSCOPY WITH ENDOBRONCHIAL NAVIGATION;  Surgeon: Garner Nash, DO;  Location: [place];  Service: Pulmonary;  Laterality: Right;  ION w/ CIOS   VIDEO BRONCHOSCOPY WITH RADIAL ENDOBRONCHIAL ULTRASOUND  04/16/2021   Procedure: RADIAL ENDOBRONCHIAL ULTRASOUND;  Surgeon: Garner Nash, DO;  Location: [facility];  Service: Pulmonary;;    FAMILY HISTORY: family history includes Cancer in his father; Heart disease in his mother.  SOCIAL HISTORY:  reports that he quit smoking about 44 years ago. His smoking use included cigarettes. He has a 5.00 pack-year smoking history. He has never used smokeless tobacco. He reports current alcohol use of about 21.0 standard drinks per week. He reports that he does not use drugs.  ALLERGIES: Patient has no known allergies.  MEDICATIONS:  Current Outpatient Medications  Medication Sig Dispense Refill   amLODipine (NORVASC) 5 MG tablet Take 5 mg by mouth 2 (two) times daily.      aspirin EC 81 MG tablet Take 81 mg by mouth daily. Swallow whole.     atorvastatin (LIPITOR)  20 MG tablet Take 20 mg by mouth daily.     carvedilol (COREG) 12.5 MG tablet Take 12.5 mg by mouth 2 (two) times daily with a meal.      Cholecalciferol (VITAMIN D3) 250 MCG (10000 UT) capsule Take 10,000 Units by mouth daily.     fluorouracil (EFUDEX) 5 % cream Apply 1 application topically 2 (two) times daily as needed (precancerous spots).     FLUoxetine (PROZAC) 40 MG capsule Take 80 mg by mouth daily.     hydrochlorothiazide (HYDRODIURIL) 25 MG tablet Take 25 mg by mouth daily.      ketoconazole (NIZORAL) 2 % shampoo Apply 1 application topically daily as needed for irritation.     lisinopril (PRINIVIL,ZESTRIL) 10 MG tablet Take 10 mg by  mouth daily.     Melatonin 10 MG TABS Take 10 mg by mouth at bedtime.     naproxen sodium (ALEVE) 220 MG tablet Take 220 mg by mouth daily as needed (pain).     tadalafil (CIALIS) 20 MG tablet Take 20 mg by mouth daily as needed for erectile dysfunction.     tamsulosin (FLOMAX) 0.4 MG CAPS capsule Take 0.4 mg by mouth daily.     No current facility-administered medications for this encounter.    REVIEW OF SYSTEMS:  On review of systems, the patient reports that he is doing well overall. He reports loss of sleep, fatigue, dental problems, blood in urine, skin cancer, and anxiety. He denies any chest pain, shortness of breath, cough, fevers, chills, night sweats, unintended weight changes. He denies any bowel disturbances, and denies abdominal pain, nausea or vomiting. He denies any new musculoskeletal or joint aches or pains. His IPSS was 9, indicating moderate urinary symptoms. He is able to complete sexual activity with most attempts with the use of Cialis 17m. A complete review of systems is obtained and is otherwise negative.   PHYSICAL EXAM:  Wt Readings from Last 3 Encounters:  04/16/21 215 lb (97.5 kg)  04/04/21 217 lb 6.4 oz (98.6 kg)  03/08/21 219 lb 9.6 oz (99.6 kg)   Temp Readings from Last 3 Encounters:  04/16/21 98.2 F (36.8 C)  04/04/21 98.3 F (36.8 C) (Oral)  03/08/21 97.7 F (36.5 C) (Temporal)   BP Readings from Last 3 Encounters:  04/16/21 (!) 144/75  04/04/21 124/76  03/08/21 136/63   Pulse Readings from Last 3 Encounters:  04/16/21 66  04/04/21 66  03/08/21 66   Pain Assessment Pain Score: 0-No pain/10  Unable to assess due to telephone follow up visit format.  KPS = 90  100 - Normal; no complaints; no evidence of disease. 90   - Able to carry on normal activity; minor signs or symptoms of disease. 80   - Normal activity with effort; some signs or symptoms of disease. 751  - Cares for self; unable to carry on normal activity or to do active work. 60    - Requires occasional assistance, but is able to care for most of his personal needs. 50   - Requires considerable assistance and frequent medical care. 460  - Disabled; requires special care and assistance. 349  - Severely disabled; hospital admission is indicated although death not imminent. 228  - Very sick; hospital admission necessary; active supportive treatment necessary. 10   - Moribund; fatal processes progressing rapidly. 0     - Dead  Karnofsky DA, Abelmann WH, Craver LS and [facility]([phone] The use of the nitrogen  mustards in the palliative treatment of carcinoma: with particular reference to bronchogenic carcinoma Cancer 1 [id number]   LABORATORY DATA:  Lab Results  Component Value Date   WBC 9.1 04/16/2021   HGB 13.4 04/16/2021   HCT 40.8 04/16/2021   MCV 94.2 04/16/2021   PLT 139 (L) 04/16/2021   Lab Results  Component Value Date   NA 135 04/16/2021   K 4.2 04/16/2021   CL 103 04/16/2021   CO2 23 04/16/2021   Lab Results  Component Value Date   ALT 19 05/10/2018   AST 17 05/10/2018   ALKPHOS 42 05/10/2018   BILITOT 0.7 05/10/2018     RADIOGRAPHY: DG CHEST PORT 1 VIEW  Result Date: 04/16/2021 CLINICAL DATA:  Status post bronchoscopy with biopsy EXAM: PORTABLE CHEST 1 VIEW COMPARISON:  Radiograph 04/28/2012, chest CT 04/16/2021 FINDINGS: Mild cardiomegaly. There is a nodular airspace opacity in the right peripheral mid lung. No large pleural effusion or visible pneumothorax. Known other lung nodules are better seen on recent CT. Left shoulder degenerative changes. Prior reverse right shoulder arthroplasty. Left shoulder degenerative changes. Old left clavicle injury. IMPRESSION: Nodular airspace opacities in the right peripheral mid lung, which in the setting of post endobronchial biopsy could represent mild hemorrhage and/or changes of lavage with known pulmonary nodule. Other nodules are better visualized on recent CT. No evidence of pneumothorax. Electronically  Signed   By: Maurine Simmering M.D.   On: 04/16/2021 14:08   CT Super D Chest Wo Contrast  Result Date: 04/16/2021 CLINICAL DATA:  Super D chest for preoperative planning for EMB. Patient with known pulmonary nodules. History of prostate cancer. EXAM: CT CHEST WITHOUT CONTRAST TECHNIQUE: Multidetector CT imaging of the chest was performed using thin slice collimation for electromagnetic bronchoscopy planning purposes, without intravenous contrast. COMPARISON:  Outside PET-CT scan [facility] dated 12/28/2020 FINDINGS: Cardiovascular: The heart is normal in size. No pericardial effusion. Tortuosity, ectasia and calcification of the thoracic aorta is stable. Extensive three-vessel coronary artery calcifications are noted. Mediastinum/Nodes: Small scattered mediastinal and hilar lymph nodes appears stable. These were not hypermetabolic on the prior PET-CT. The esophagus is grossly normal. Small hiatal hernia. Lungs/Pleura: Numerous predominantly right-sided pulmonary nodules consistent with metastatic disease. Index lesions: 15.5 mm right upper lobe nodule on image number 63/4. 17 mm right lower lobe nodule on image 77/4. 17.5 mm left lower lobe nodule on image number 123/4. No pleural effusions or pleural nodules. Upper Abdomen: No significant upper abdominal findings. Musculoskeletal: No chest wall mass, supraclavicular axillary adenopathy. Small scattered lymph nodes appears stable. The bony thorax is intact. No lytic or sclerotic bone lesions are identified. IMPRESSION: 1. Metastatic pulmonary nodules. 2. Stable small scattered mediastinal and hilar lymph nodes. 3. Stable advanced three-vessel coronary artery calcifications. Aortic Atherosclerosis (ICD10-I70.0) and Emphysema (ICD10-J43.9). Electronically Signed   By: Marijo Sanes M.D.   On: 04/16/2021 09:25   DG C-ARM BRONCHOSCOPY  Result Date: 04/16/2021 C-ARM BRONCHOSCOPY: Fluoroscopy was utilized by the requesting physician.  No radiographic interpretation.        IMPRESSION/PLAN: 80 y.o. gentleman with oligometastatic prostate cancer involving the lungs and a current PSA of 6.3 s/p EBRT in 2019 for stage T1c adenocarcinoma of the prostate with a Gleason's score of 3+3 and a PSA of 16.9 at diagnosis.  Today, I talked to the patient and his wife about the findings and workup thus far. We discussed the natural history of recurrent prostate adenocarcinoma and general treatment, highlighting the role of stereotactic radiotherapy in the management of  oligometastatic disease.  We discussed the available radiation techniques, and focused on the details and logistics of stereotactic body radiotherapy (SBRT). The recommendation is for a 3-5 fraction course of SBRT to the nodules in the RUL, RLL and LLL. We reviewed the anticipated acute and late sequelae associated with radiation in this setting. The patient was encouraged to ask questions that were answered to his satisfaction.  At the conclusion of our conversation, the patient is in agreement with proceeding with the recommended course of SBRT to the bilateral lung nodules.  He appears to have a good understanding of his disease and our recommendations and is comfortable and in agreement with the stated plan.  We will share our discussion with Dr. Louis Meckel and proceed with coordinating CT Sim/treatment planning in anticipation of beginning his treatments in the near future.  We enjoyed meeting with him again today and look forward to continuing to participate in his care.  He knows that he is welcome to call at anytime in the interim with any questions or concerns related to radiation.  I personally spent 30 minutes in this encounter including chart review, reviewing radiological studies, meeting face-to-face with the patient, entering orders and completing documentation.    Nicholos Johns, PA-C    Tyler Pita, MD  [facility] Direct Dial: [phone]  Fax: [phone] [email or web address]   Skype  [email or web address]

## 2021-04-26 ENCOUNTER — Telehealth: Payer: Self-pay

## 2021-04-26 NOTE — Telephone Encounter (Signed)
Brian Bean called and then came into Cancer center requests for copy of CT scan and Pathology report from 2 weeks ago.  Patient received copy of both and left the building satisfied.  Nothing else follows.

## 2021-04-28 NOTE — Progress Notes (Signed)
  Radiation Oncology         [phone] ________________________________  Name: Brian Bean MRN: [id number]  Date: 04/29/2021  DOB: [birth year]  Chart Note    ICD-10-CM   1. Malignant neoplasm of prostate metastatic to lung ([place])  C61    C78.00     2. Prostate cancer [facility])  C61       DIAGNOSIS:  80 yo man with bilateral numerous isolated lung metastases from metastatic hormone naive prostate cancer  NARRATIVE:  The patient was originally scheduled for SBRT Simulation today for bilateral biopsy proven lung metastases from prostate cancer.  In reviewing the patient's PSMA-PET from [place] on 12/28/20 and his Super-D Chest CT from 04/16/21 as part of our pre-simulation strategy, I noted that the reports each mentioned multiple bilateral nodules, and then provided measurments for 2 and 3 nodules, respectively.  This led me to assume that there were 2-3 nodules.  Unfortunately, the 2-3 reported nodules were only intended to serve as index lesions representing many more.  As we personally reviewed the imaging today, it was clear that there are numerous nodules, perhaps as many as at least 15 lung nodules.  Accordingly, we cancelled his SBRT simulation and I discussed the change in plans with the patient and his wife.  I showed them the PSMA-PET and CT images on the monitor in the simulation suite.  I shared that while SBRT to 15 targets in the lung may be technically possible, it was clinically contraindicated, due the large volume of scattered radiation likely to cause pneumonitis.  I explained that systemic therapy is the safest and most effective strategy now.  He does carry a diagnosis of hypogonadism, but, his prostate cancer is hormone naive.  I talked to the patient about making a referral to Dr. Alen Blew in medical oncology to discuss treatment options.  PLAN:  Will refer to Dr. Alen Blew for consultation.    ________________________________  Brian Bean Tammi Klippel, M.D.

## 2021-04-29 ENCOUNTER — Ambulatory Visit
Admission: RE | Admit: 2021-04-29 | Discharge: 2021-04-29 | Disposition: A | Discharge status: Home / Self Care | Payer: Medicare Other | Source: Ambulatory Visit | Attending: Radiation Oncology | Admitting: Radiation Oncology

## 2021-04-29 ENCOUNTER — Other Ambulatory Visit: Payer: Self-pay

## 2021-04-29 DIAGNOSIS — C61 Malignant neoplasm of prostate: Secondary | ICD-10-CM

## 2021-04-29 DIAGNOSIS — C7800 Secondary malignant neoplasm of unspecified lung: Secondary | ICD-10-CM

## 2021-04-29 NOTE — Addendum Note (Signed)
Encounter addended by: Freeman Caldron, PA-C on: 04/29/2021 12:53 PM  Actions taken: Clinical Note Signed

## 2021-04-29 NOTE — Addendum Note (Signed)
Encounter addended by: Freeman Caldron, PA-C on: 04/29/2021 12:51 PM  Actions taken: Clinical Note Signed

## 2021-04-30 ENCOUNTER — Telehealth: Payer: Self-pay | Admitting: Oncology

## 2021-04-30 NOTE — Telephone Encounter (Signed)
Scheduled appt per 11/21 referral. Pt's wife is aware of appt date and time.

## 2021-05-09 ENCOUNTER — Inpatient Hospital Stay: Payer: Medicare Other | Attending: Oncology | Admitting: Oncology

## 2021-05-09 ENCOUNTER — Other Ambulatory Visit: Payer: Self-pay

## 2021-05-09 ENCOUNTER — Inpatient Hospital Stay: Payer: Medicare Other

## 2021-05-09 VITALS — BP 143/75 | HR 65 | Temp 98.2°F | Resp 17 | Ht 72.0 in | Wt 218.0 lb

## 2021-05-09 DIAGNOSIS — C61 Malignant neoplasm of prostate: Secondary | ICD-10-CM | POA: Diagnosis not present

## 2021-05-09 DIAGNOSIS — C9110 Chronic lymphocytic leukemia of B-cell type not having achieved remission: Secondary | ICD-10-CM | POA: Diagnosis not present

## 2021-05-09 DIAGNOSIS — Z923 Personal history of irradiation: Secondary | ICD-10-CM | POA: Insufficient documentation

## 2021-05-09 DIAGNOSIS — C7802 Secondary malignant neoplasm of left lung: Secondary | ICD-10-CM | POA: Insufficient documentation

## 2021-05-09 DIAGNOSIS — C7801 Secondary malignant neoplasm of right lung: Secondary | ICD-10-CM | POA: Insufficient documentation

## 2021-05-09 LAB — CBC WITH DIFFERENTIAL (CANCER CENTER ONLY)
Abs Immature Granulocytes: 0.03 10*3/uL (ref 0.00–0.07)
Basophils Absolute: 0.1 10*3/uL (ref 0.0–0.1)
Basophils Relative: 1 %
Eosinophils Absolute: 0.1 10*3/uL (ref 0.0–0.5)
Eosinophils Relative: 1 %
HCT: 42.5 % (ref 39.0–52.0)
Hemoglobin: 14.6 g/dL (ref 13.0–17.0)
Immature Granulocytes: 0 %
Lymphocytes Relative: 55 %
Lymphs Abs: 5.9 10*3/uL — ABNORMAL HIGH (ref 0.7–4.0)
MCH: 31.1 pg (ref 26.0–34.0)
MCHC: 34.4 g/dL (ref 30.0–36.0)
MCV: 90.4 fL (ref 80.0–100.0)
Monocytes Absolute: 0.5 10*3/uL (ref 0.1–1.0)
Monocytes Relative: 4 %
Neutro Abs: 4.2 10*3/uL (ref 1.7–7.7)
Neutrophils Relative %: 39 %
Platelet Count: 148 10*3/uL — ABNORMAL LOW (ref 150–400)
RBC: 4.7 MIL/uL (ref 4.22–5.81)
RDW: 13.8 % (ref 11.5–15.5)
Smear Review: NORMAL
WBC Count: 10.8 10*3/uL — ABNORMAL HIGH (ref 4.0–10.5)
nRBC: 0 % (ref 0.0–0.2)

## 2021-05-09 LAB — CMP (CANCER CENTER ONLY)
ALT: 26 U/L (ref 0–44)
AST: 20 U/L (ref 15–41)
Albumin: 3.9 g/dL (ref 3.5–5.0)
Alkaline Phosphatase: 54 U/L (ref 38–126)
Anion gap: 10 (ref 5–15)
BUN: 16 mg/dL (ref 8–23)
CO2: 26 mmol/L (ref 22–32)
Calcium: 8.9 mg/dL (ref 8.9–10.3)
Chloride: 104 mmol/L (ref 98–111)
Creatinine: 0.93 mg/dL (ref 0.61–1.24)
GFR, Estimated: >60 mL/min (ref >60)
Glucose, Bld: 113 mg/dL — ABNORMAL HIGH (ref 70–99)
Potassium: 3.6 mmol/L (ref 3.5–5.1)
Sodium: 140 mmol/L (ref 135–145)
Total Bilirubin: 0.7 mg/dL (ref 0.3–1.2)
Total Protein: 6.7 g/dL (ref 6.5–8.1)

## 2021-05-09 NOTE — Progress Notes (Signed)
Reason for the request:    Pulmonary nodules  HPI: I was asked by Dr. Tammi Klippel to evaluate Brian Bean for pulmonary nodules.  He is a 80 year old with history of prostate cancer diagnosed in 2015.  He was found to have a PSA of 5.96 and underwent a biopsy in the care of Dr. Louis Meckel which showed a Gleason score of 3+3 equal 6.  He was on active surveillance till 2018 where his repeat biopsy continues to show Gleason score 6.  His PSA increased up to 16.9 in 2018.  He subsequently received definitive treatment and 2019 receiving 5.5 weeks of IMRT to the prostate without androgen deprivation.  His PSA nadir was 0.6.  In November 2021 his PSA was up to 2.5 and in May 2022 was 4.3.  He underwent PSMA PET scan in July 2022 which was performed [facility] and showed 2 hypermetabolic pulmonary nodules with 1.4 cm right upper lobe nodule and a 1.5 cm left lower lobe nodule.  No evidence of lymphadenopathy or recurrent disease in the prostate.  CT scan of the chest obtained on April 16, 2021 did not show multiple metastatic pulmonary nodules with a stable small mediastinal and hilar lymph nodes.  He underwent bronchoscopy and biopsy of his pulmonary nodules on April 16, 2021.  The biopsy showed malignant cells consistent with carcinoma although further testing could not be completed due to insufficient material.  His TTF 1, Napsin A, p63 and CK5/6 where attempted and noncontributory.  He does have history of CLL and currently on active surveillance and has not required any treatment.  He is asymptomatic from these findings.  He denies any chest pain, difficulty breathing, cough or hemoptysis.  His performance status remained reasonable.  He was considered for radiation treatment but given the number of his pulmonary nodules this has been deferred for systemic therapy.   He does not report any headaches, blurry vision, syncope or seizures. Does not report any fevers, chills or sweats.  Does not report any cough, wheezing  or hemoptysis.  Does not report any chest pain, palpitation, orthopnea or leg edema.  Does not report any nausea, vomiting or abdominal pain.  Does not report any constipation or diarrhea.  Does not report any skeletal complaints.    Does not report frequency, urgency or hematuria.  Does not report any skin rashes or lesions. Does not report any heat or cold intolerance.  Does not report any lymphadenopathy or petechiae.  Does not report any anxiety or depression.  Remaining review of systems is negative.     Past Medical History:  Diagnosis Date   Anxiety    Arthritis    Cancer ([place])    skin lesion scalp   CLL (chronic lymphocytic leukemia) ([place]) 01/06/2013   CLL (chronic lymphocytic leukemia) ([place])    Depression    Head injury    fell from a horse   Hypertension    Lymphocytosis    PONV (postoperative nausea and vomiting)    extremely sick did not get sick after hip surgery in 2013   Prostate cancer [facility])   :   Past Surgical History:  Procedure Laterality Date   APPENDECTOMY     BRONCHIAL BIOPSY  04/16/2021   Procedure: BRONCHIAL BIOPSIES;  Surgeon: Garner Nash, DO;  Location: [place] [facility];  Service: Pulmonary;;   BRONCHIAL BRUSHINGS  04/16/2021   Procedure: BRONCHIAL BRUSHINGS;  Surgeon: Garner Nash, DO;  Location: [place] [facility];  Service: Pulmonary;;   BRONCHIAL NEEDLE ASPIRATION BIOPSY  04/16/2021   Procedure: BRONCHIAL NEEDLE ASPIRATION BIOPSIES;  Surgeon: Garner Nash, DO;  Location: [place] [facility];  Service: Pulmonary;;   BRONCHIAL WASHINGS  04/16/2021   Procedure: BRONCHIAL WASHINGS;  Surgeon: Garner Nash, DO;  Location: [place] [facility];  Service: Pulmonary;;   CATARACT EXTRACTION, BILATERAL     CERVICAL DISC SURGERY  00   COLONOSCOPY  2011   [facility].    FRACTURE SURGERY     rt ankle   HERNIA REPAIR     rt and  undistended testicle   PROSTATE BIOPSY     REVERSE SHOULDER ARTHROPLASTY Right 05/01/2020   Procedure: REVERSE SHOULDER ARTHROPLASTY;  Surgeon:  Corky Mull, MD;  Location: [facility];  Service: Orthopedics;  Laterality: Right;   rt hip  12   pinned   TOTAL HIP ARTHROPLASTY  05/03/2012   Procedure: TOTAL HIP ARTHROPLASTY;  Surgeon: Kerin Salen, MD;  Location: [place];  Service: Orthopedics;  Laterality: Right;   VIDEO BRONCHOSCOPY WITH ENDOBRONCHIAL NAVIGATION Right 04/16/2021   Procedure: VIDEO BRONCHOSCOPY WITH ENDOBRONCHIAL NAVIGATION;  Surgeon: Garner Nash, DO;  Location: [place];  Service: Pulmonary;  Laterality: Right;  ION w/ CIOS   VIDEO BRONCHOSCOPY WITH RADIAL ENDOBRONCHIAL ULTRASOUND  04/16/2021   Procedure: RADIAL ENDOBRONCHIAL ULTRASOUND;  Surgeon: Garner Nash, DO;  Location: [place] [facility];  Service: Pulmonary;;  :   Current Outpatient Medications:    amLODipine (NORVASC) 5 MG tablet, Take 5 mg by mouth 2 (two) times daily. , Disp: , Rfl:    aspirin EC 81 MG tablet, Take 81 mg by mouth daily. Swallow whole., Disp: , Rfl:    atorvastatin (LIPITOR) 20 MG tablet, Take 20 mg by mouth daily., Disp: , Rfl:    carvedilol (COREG) 12.5 MG tablet, Take 12.5 mg by mouth 2 (two) times daily with a meal. , Disp: , Rfl:    Cholecalciferol (VITAMIN D3) 250 MCG (10000 UT) capsule, Take 10,000 Units by mouth daily., Disp: , Rfl:    fluorouracil (EFUDEX) 5 % cream, Apply 1 application topically 2 (two) times daily as needed (precancerous spots)., Disp: , Rfl:    FLUoxetine (PROZAC) 40 MG capsule, Take 80 mg by mouth daily., Disp: , Rfl:    hydrochlorothiazide (HYDRODIURIL) 25 MG tablet, Take 25 mg by mouth daily. , Disp: , Rfl:    ketoconazole (NIZORAL) 2 % shampoo, Apply 1 application topically daily as needed for irritation., Disp: , Rfl:    lisinopril (PRINIVIL,ZESTRIL) 10 MG tablet, Take 10 mg by mouth daily., Disp: , Rfl:    Melatonin 10 MG TABS, Take 10 mg by mouth at bedtime., Disp: , Rfl:    naproxen sodium (ALEVE) 220 MG tablet, Take 220 mg by mouth daily as needed (pain)., Disp: , Rfl:    tadalafil (CIALIS) 20 MG  tablet, Take 20 mg by mouth daily as needed for erectile dysfunction., Disp: , Rfl:    tamsulosin (FLOMAX) 0.4 MG CAPS capsule, Take 0.4 mg by mouth daily., Disp: , Rfl: :  No Known Allergies:   Family History  Problem Relation Age of Onset   Heart disease Mother    Cancer Father        unk type of cancer  :   Social History   Socioeconomic History   Marital status: Legally Separated    Spouse name: Not on file   Number of children: 1   Years of education: Not on file   Highest education level: Not on file  Occupational History  Comment: retired Copywriter, advertising ([employer]  Tobacco Use   Smoking status: Former    Packs/day: 0.50    Years: 10.00    Pack years: 5.00    Types: Cigarettes    Quit date: 04/28/1977    Years since quitting: 44.0   Smokeless tobacco: Never   Tobacco comments:    10 oz alcohol wkly  Vaping Use   Vaping Use: Never used  Substance and Sexual Activity   Alcohol use: Yes    Alcohol/week: 21.0 standard drinks    Types: 5 Glasses of wine, 16 Shots of liquor per week    Comment: 16 ounces a week   Drug use: No   Sexual activity: Yes  Other Topics Concern   Not on file  Social History Narrative   Retired.   Significant other: Nicanor Bake   One son, Saralyn Pilar   Social Determinants of Health   Financial Resource Strain: Not on [employer] Insecurity: Not on file  Transportation Needs: Not on file  Physical Activity: Not on file  Stress: Not on file  Social Connections: Not on file  Intimate Partner Violence: Not on file  :  Pertinent items are noted in HPI.  Exam: Blood pressure (!) 143/75, pulse 65, temperature 98.2 F (36.8 C), temperature source Temporal, resp. rate 17, height 6' (1.829 m), weight 218 lb (98.9 kg), SpO2 99 %. ECOG 1 General appearance: alert and cooperative appeared without distress. Head: atraumatic without any abnormalities. Eyes: conjunctivae/corneas clear. PERRL.  Sclera anicteric. Throat: lips,  mucosa, and tongue normal; without oral thrush or ulcers. Resp: clear to auscultation bilaterally without rhonchi, wheezes or dullness to percussion. Cardio: regular rate and rhythm, S1, S2 normal, no murmur, click, rub or gallop GI: soft, non-tender; bowel sounds normal; no masses,  no organomegaly Skin: Skin color, texture, turgor normal. No rashes or lesions Lymph nodes: Cervical, supraclavicular, and axillary nodes normal. Neurologic: Grossly normal without any motor, sensory or deep tendon reflexes. Musculoskeletal: No joint deformity or effusion.   DG CHEST PORT 1 VIEW  Result Date: 04/16/2021 CLINICAL DATA:  Status post bronchoscopy with biopsy EXAM: PORTABLE CHEST 1 VIEW COMPARISON:  Radiograph 04/28/2012, chest CT 04/16/2021 FINDINGS: Mild cardiomegaly. There is a nodular airspace opacity in the right peripheral mid lung. No large pleural effusion or visible pneumothorax. Known other lung nodules are better seen on recent CT. Left shoulder degenerative changes. Prior reverse right shoulder arthroplasty. Left shoulder degenerative changes. Old left clavicle injury. IMPRESSION: Nodular airspace opacities in the right peripheral mid lung, which in the setting of post endobronchial biopsy could represent mild hemorrhage and/or changes of lavage with known pulmonary nodule. Other nodules are better visualized on recent CT. No evidence of pneumothorax. Electronically Signed   By: Maurine Simmering M.D.   On: 04/16/2021 14:08   CT Super D Chest Wo Contrast  Result Date: 04/16/2021 CLINICAL DATA:  Super D chest for preoperative planning for EMB. Patient with known pulmonary nodules. History of prostate cancer. EXAM: CT CHEST WITHOUT CONTRAST TECHNIQUE: Multidetector CT imaging of the chest was performed using thin slice collimation for electromagnetic bronchoscopy planning purposes, without intravenous contrast. COMPARISON:  Outside PET-CT scan [facility] dated 12/28/2020 FINDINGS: Cardiovascular: The heart is  normal in size. No pericardial effusion. Tortuosity, ectasia and calcification of the thoracic aorta is stable. Extensive three-vessel coronary artery calcifications are noted. Mediastinum/Nodes: Small scattered mediastinal and hilar lymph nodes appears stable. These were not hypermetabolic on the prior PET-CT. The esophagus is grossly normal. Small  hiatal hernia. Lungs/Pleura: Numerous predominantly right-sided pulmonary nodules consistent with metastatic disease. Index lesions: 15.5 mm right upper lobe nodule on image number 63/4. 17 mm right lower lobe nodule on image 77/4. 17.5 mm left lower lobe nodule on image number 123/4. No pleural effusions or pleural nodules. Upper Abdomen: No significant upper abdominal findings. Musculoskeletal: No chest wall mass, supraclavicular axillary adenopathy. Small scattered lymph nodes appears stable. The bony thorax is intact. No lytic or sclerotic bone lesions are identified. IMPRESSION: 1. Metastatic pulmonary nodules. 2. Stable small scattered mediastinal and hilar lymph nodes. 3. Stable advanced three-vessel coronary artery calcifications. Aortic Atherosclerosis (ICD10-I70.0) and Emphysema (ICD10-J43.9). Electronically Signed   By: Marijo Sanes M.D.   On: 04/16/2021 09:25   DG C-ARM BRONCHOSCOPY  Result Date: 04/16/2021 C-ARM BRONCHOSCOPY: Fluoroscopy was utilized by the requesting physician.  No radiographic interpretation.    Assessment and Plan:   80 year old man with:  1.  Bilateral pulmonary nodules suspicious for metastatic prostate cancer.  He underwent a biopsy in November 2022 which showed metastatic carcinoma although further testing could not be completed due to lack of material.  The differential diagnosis of these findings were discussed at this time.  Given his history of prostate cancer, rising PSA and his PSMA PET positivity this is in all likelihood represent metastatic prostate cancer although the presentation is very unusual.  Other  possibilities including primary lung neoplasm, metastatic carcinoma from the skin could also be considered.  Management choices at this time were reviewed which include obtaining a repeat biopsy which could be problematic given the location of these nodules which might require an open biopsy versus proceeding with treatment under the presumption of advanced prostate cancer.  After discussion today, we opted to proceed with androgen deprivation therapy in the immediate future and repeat imaging studies in 3 months.  In the meantime we will continue to monitor his PSA and testosterone level and determine whether he is establishing a positive response.  If these nodules response to androgen deprivation and we are likely dealing with prostate cancer and we can consider additional therapy at that time.  If these nodules are increasing in size and a repeat biopsy would be recommended.  2.  Androgen deprivation therapy: Risks and benefits of starting Eligard were discussed.  Complication occluding weight gain, hot flashes among others were reviewed.  This will be tentatively given at 30 mg every 4 months starting on December 2.  3.  Prostate cancer: Presented with a Gleason score of 3+3 equal 6 and a PSA of 16.9.  He had received definitive therapy with radiation and is experiencing a rise in his PSA.  His PSA and testosterone level will be updated today and he will receive androgen deprivation therapy which would be reasonable regardless of the etiology of his pulmonary nodules.  Therapy escalation not beyond androgen depravation were discussed.  These would include androgen receptor pathway inhibitors and systemic chemotherapy were discussed.  These will be considered if his pulmonary nodules are indeed metastatic prostate cancer.  4.  CLL: Currently on active surveillance without any indication for treatment.  5.  Follow up: will be in the next 3 months for repeat evaluation.  60  minutes were dedicated  to this visit. The time was spent on reviewing laboratory data, imaging studies, discussing treatment options, discussing differential diagnosis and answering questions regarding future plan.     A copy of this consult has been forwarded to the requesting physician.

## 2021-05-09 NOTE — Progress Notes (Signed)
Introduced myself to patient as the prostate nurse navigator and discussed my role.  No barriers to care identified at this time.  He is here to discuss his treatment options with Dr. Alen Blew.  I gave him my business card and asked him to call me with questions or concerns.  Verbalized understanding.

## 2021-05-10 ENCOUNTER — Ambulatory Visit: Payer: Medicare Other

## 2021-05-10 ENCOUNTER — Ambulatory Visit: Payer: Medicare Other | Attending: Hematology and Oncology

## 2021-05-10 VITALS — BP 155/78 | HR 67 | Temp 98.6°F | Resp 17

## 2021-05-10 DIAGNOSIS — D696 Thrombocytopenia, unspecified: Secondary | ICD-10-CM | POA: Insufficient documentation

## 2021-05-10 DIAGNOSIS — Z79899 Other long term (current) drug therapy: Secondary | ICD-10-CM | POA: Insufficient documentation

## 2021-05-10 DIAGNOSIS — C61 Malignant neoplasm of prostate: Secondary | ICD-10-CM

## 2021-05-10 DIAGNOSIS — C9110 Chronic lymphocytic leukemia of B-cell type not having achieved remission: Secondary | ICD-10-CM | POA: Diagnosis not present

## 2021-05-10 DIAGNOSIS — C7800 Secondary malignant neoplasm of unspecified lung: Secondary | ICD-10-CM

## 2021-05-10 LAB — TESTOSTERONE: Testosterone: 279 ng/dL (ref 264–916)

## 2021-05-10 LAB — PROSTATE-SPECIFIC AG, SERUM (LABCORP): Prostate Specific Ag, Serum: 8.7 ng/mL — ABNORMAL HIGH (ref 0.0–4.0)

## 2021-05-10 MED ORDER — LEUPROLIDE ACETATE (4 MONTH) 30 MG ~~LOC~~ KIT
30.0000 mg | PACK | Freq: Once | SUBCUTANEOUS | Status: AC
  Administered 2021-05-10: 30 mg via SUBCUTANEOUS
  Filled 2021-05-10: qty 30

## 2021-05-13 ENCOUNTER — Ambulatory Visit: Payer: Medicare Other

## 2021-05-14 ENCOUNTER — Ambulatory Visit: Payer: Medicare Other

## 2021-05-15 ENCOUNTER — Ambulatory Visit: Payer: Medicare Other

## 2021-05-21 DIAGNOSIS — Z20822 Contact with and (suspected) exposure to covid-19: Secondary | ICD-10-CM | POA: Diagnosis not present

## 2021-05-21 DIAGNOSIS — R059 Cough, unspecified: Secondary | ICD-10-CM | POA: Diagnosis not present

## 2021-05-21 DIAGNOSIS — J0100 Acute maxillary sinusitis, unspecified: Secondary | ICD-10-CM | POA: Diagnosis not present

## 2021-07-08 DIAGNOSIS — C7800 Secondary malignant neoplasm of unspecified lung: Secondary | ICD-10-CM | POA: Diagnosis not present

## 2021-07-08 DIAGNOSIS — E785 Hyperlipidemia, unspecified: Secondary | ICD-10-CM | POA: Diagnosis not present

## 2021-07-08 DIAGNOSIS — J3089 Other allergic rhinitis: Secondary | ICD-10-CM | POA: Diagnosis not present

## 2021-07-08 DIAGNOSIS — Z79899 Other long term (current) drug therapy: Secondary | ICD-10-CM | POA: Diagnosis not present

## 2021-07-08 DIAGNOSIS — I10 Essential (primary) hypertension: Secondary | ICD-10-CM | POA: Diagnosis not present

## 2021-07-08 DIAGNOSIS — Z0000 Encounter for general adult medical examination without abnormal findings: Secondary | ICD-10-CM | POA: Diagnosis not present

## 2021-07-08 DIAGNOSIS — Z1389 Encounter for screening for other disorder: Secondary | ICD-10-CM | POA: Diagnosis not present

## 2021-07-08 DIAGNOSIS — R5382 Chronic fatigue, unspecified: Secondary | ICD-10-CM | POA: Diagnosis not present

## 2021-07-08 DIAGNOSIS — E7800 Pure hypercholesterolemia, unspecified: Secondary | ICD-10-CM | POA: Diagnosis not present

## 2021-07-08 DIAGNOSIS — C9110 Chronic lymphocytic leukemia of B-cell type not having achieved remission: Secondary | ICD-10-CM | POA: Diagnosis not present

## 2021-07-22 DIAGNOSIS — J309 Allergic rhinitis, unspecified: Secondary | ICD-10-CM | POA: Diagnosis not present

## 2021-07-22 DIAGNOSIS — J300 Vasomotor rhinitis: Secondary | ICD-10-CM | POA: Diagnosis not present

## 2021-07-22 DIAGNOSIS — R0981 Nasal congestion: Secondary | ICD-10-CM | POA: Diagnosis not present

## 2021-07-22 DIAGNOSIS — H6123 Impacted cerumen, bilateral: Secondary | ICD-10-CM | POA: Diagnosis not present

## 2021-07-22 DIAGNOSIS — H60543 Acute eczematoid otitis externa, bilateral: Secondary | ICD-10-CM | POA: Diagnosis not present

## 2021-07-25 DIAGNOSIS — H52223 Regular astigmatism, bilateral: Secondary | ICD-10-CM | POA: Diagnosis not present

## 2021-07-25 DIAGNOSIS — H5203 Hypermetropia, bilateral: Secondary | ICD-10-CM | POA: Diagnosis not present

## 2021-07-25 DIAGNOSIS — Z961 Presence of intraocular lens: Secondary | ICD-10-CM | POA: Diagnosis not present

## 2021-07-25 DIAGNOSIS — H524 Presbyopia: Secondary | ICD-10-CM | POA: Diagnosis not present

## 2021-08-01 ENCOUNTER — Other Ambulatory Visit: Payer: Self-pay | Admitting: *Deleted

## 2021-08-01 DIAGNOSIS — C61 Malignant neoplasm of prostate: Secondary | ICD-10-CM

## 2021-08-01 DIAGNOSIS — D696 Thrombocytopenia, unspecified: Secondary | ICD-10-CM

## 2021-08-01 NOTE — Progress Notes (Signed)
Lab orders were faxed to [facility] in [place], [place] @[phone]. Receipt of fax was received.

## 2021-08-05 DIAGNOSIS — H903 Sensorineural hearing loss, bilateral: Secondary | ICD-10-CM | POA: Diagnosis not present

## 2021-08-07 DIAGNOSIS — C9110 Chronic lymphocytic leukemia of B-cell type not having achieved remission: Secondary | ICD-10-CM | POA: Diagnosis not present

## 2021-08-07 DIAGNOSIS — C7800 Secondary malignant neoplasm of unspecified lung: Secondary | ICD-10-CM | POA: Diagnosis not present

## 2021-08-08 ENCOUNTER — Other Ambulatory Visit: Payer: Self-pay

## 2021-08-08 ENCOUNTER — Inpatient Hospital Stay (HOSPITAL_BASED_OUTPATIENT_CLINIC_OR_DEPARTMENT_OTHER): Payer: Medicare Other | Admitting: Oncology

## 2021-08-08 ENCOUNTER — Inpatient Hospital Stay: Payer: Medicare Other | Attending: Oncology

## 2021-08-08 VITALS — BP 139/76 | HR 69 | Temp 97.7°F | Resp 18 | Wt 210.5 lb

## 2021-08-08 DIAGNOSIS — R9721 Rising PSA following treatment for malignant neoplasm of prostate: Secondary | ICD-10-CM | POA: Insufficient documentation

## 2021-08-08 DIAGNOSIS — C9110 Chronic lymphocytic leukemia of B-cell type not having achieved remission: Secondary | ICD-10-CM | POA: Insufficient documentation

## 2021-08-08 DIAGNOSIS — R918 Other nonspecific abnormal finding of lung field: Secondary | ICD-10-CM | POA: Insufficient documentation

## 2021-08-08 DIAGNOSIS — C61 Malignant neoplasm of prostate: Secondary | ICD-10-CM | POA: Insufficient documentation

## 2021-08-08 DIAGNOSIS — Z79899 Other long term (current) drug therapy: Secondary | ICD-10-CM | POA: Diagnosis not present

## 2021-08-08 NOTE — Progress Notes (Signed)
Hematology and Oncology Follow Up Visit ? ?Nethan Caudillo ?[id number] ?1940-10-16 81 y.o. ?08/08/2021 2:49 PM ?Lajean Manes, MDStoneking, Christiane Ha, MD  ? ?Principle Diagnosis: 81 year old with prostate cancer diagnosed and 2015 with a PSA 5.9 and Gleason score of 6.  He developed castration-sensitive advanced disease with PSA up to 8.7 with pulmonary nodules suspicious for metastatic disease. ? ? ?Prior Therapy: ? ?He was on active surveillance between 2015 and 2019. ? ?He subsequently received definitive treatment and 2019 receiving 5.5 weeks of IMRT to the prostate without androgen deprivation.  His PSA nadir was 0.6.   ? ?In November 2021 his PSA was up to 2.5 and in May 2022 was 4.3.  He underwent PSMA PET scan in July 2022 which was performed at [facility] and showed 2 hypermetabolic pulmonary nodules with 1.4 cm right upper lobe nodule and a 1.5 cm left lower lobe nodule.  ? ? ?Current therapy: Eligard 30 mg every 4 months started on May 10, 2021. ? ?Interim History: Mr. Vitug returns today for a follow-up visit.  Since last visit, he reports no major changes in his health.  He tolerated Eligard without any major complications.  He does report some fatigue tiredness and occasional weakness associated with it.  He denies any bone pain or pathological fractures.  He denies any dyspnea on exertion. ? ? ? ? ?Medications: I have reviewed the patient's current medications.  ?Current Outpatient Medications  ?Medication Sig Dispense Refill  ? amLODipine (NORVASC) 5 MG tablet Take 5 mg by mouth 2 (two) times daily.     ? aspirin EC 81 MG tablet Take 81 mg by mouth daily. Swallow whole.    ? atorvastatin (LIPITOR) 20 MG tablet Take 20 mg by mouth daily.    ? carvedilol (COREG) 12.5 MG tablet Take 12.5 mg by mouth 2 (two) times daily with a meal.     ? Cholecalciferol (VITAMIN D3) 250 MCG (10000 UT) capsule Take 10,000 Units by mouth daily.    ? fluorouracil (EFUDEX) 5 % cream Apply 1 application topically 2 (two) times daily as  needed (precancerous spots).    ? FLUoxetine (PROZAC) 40 MG capsule Take 80 mg by mouth daily.    ? hydrochlorothiazide (HYDRODIURIL) 25 MG tablet Take 25 mg by mouth daily.     ? ketoconazole (NIZORAL) 2 % shampoo Apply 1 application topically daily as needed for irritation.    ? lisinopril (PRINIVIL,ZESTRIL) 10 MG tablet Take 10 mg by mouth daily.    ? Melatonin 10 MG TABS Take 10 mg by mouth at bedtime.    ? naproxen sodium (ALEVE) 220 MG tablet Take 220 mg by mouth daily as needed (pain).    ? tadalafil (CIALIS) 20 MG tablet Take 20 mg by mouth daily as needed for erectile dysfunction.    ? tamsulosin (FLOMAX) 0.4 MG CAPS capsule Take 0.4 mg by mouth daily.    ? ?No current facility-administered medications for this visit.  ? ? ? ?Allergies: No Known Allergies ? ? ? ?Physical Exam: ?Blood pressure 139/76, pulse 69, temperature 97.7 ?F (36.5 ?C), resp. rate 18, weight 210 lb 8 oz (95.5 kg), SpO2 99 %. ? ?ECOG: 1 ? ? ?General appearance: Comfortable appearing without any discomfort ?Head: Normocephalic without any trauma ?Oropharynx: Mucous membranes are moist and pink without any thrush or ulcers. ?Eyes: Pupils are equal and round reactive to light. ?Lymph nodes: No cervical, supraclavicular, inguinal or axillary lymphadenopathy.   ?Heart:regular rate and rhythm.  S1 and S2 without leg edema. ?  Lung: Clear without any rhonchi or wheezes.  No dullness to percussion. ?Abdomin: Soft, nontender, nondistended with good bowel sounds.  No hepatosplenomegaly. ?Musculoskeletal: No joint deformity or effusion.  Full range of motion noted. ?Neurological: No deficits noted on motor, sensory and deep tendon reflex exam. ?Skin: No petechial rash or dryness.  Appeared moist.  ? ? ? ?Lab Results: ?Lab Results  ?Component Value Date  ? WBC 10.8 (H) 05/09/2021  ? HGB 14.6 05/09/2021  ? HCT 42.5 05/09/2021  ? MCV 90.4 05/09/2021  ? PLT 148 (L) 05/09/2021  ? ?  Chemistry   ?   ?Component Value Date/Time  ? NA 140 05/09/2021 0936  ?  NA 136 06/24/2016 1014  ? K 3.6 05/09/2021 0936  ? K 3.9 06/24/2016 1014  ? CL 104 05/09/2021 0936  ? CL 105 10/06/2012 1047  ? CO2 26 05/09/2021 0936  ? CO2 24 06/24/2016 1014  ? BUN 16 05/09/2021 0936  ? BUN 17.8 06/24/2016 1014  ? CREATININE 0.93 05/09/2021 0936  ? CREATININE 0.9 06/24/2016 1014  ?    ?Component Value Date/Time  ? CALCIUM 8.9 05/09/2021 0936  ? CALCIUM 9.2 06/24/2016 1014  ? ALKPHOS 54 05/09/2021 0936  ? ALKPHOS 55 06/24/2016 1014  ? AST 20 05/09/2021 0936  ? AST 16 06/24/2016 1014  ? ALT 26 05/09/2021 0936  ? ALT 17 06/24/2016 1014  ? BILITOT 0.7 05/09/2021 0936  ? BILITOT 0.51 06/24/2016 1014  ?  ? ? ? ? ? ?Impression and Plan: ? ?81 year old man with: ? ?1.  Castration-sensitive advanced prostate cancer with rising PSA as well as pulmonary nodules suspicious for metastatic disease. ? ?He has a PSA was up to 8.7 in December 2022 and testosterone was 279.  His PSA is down to 0.2 on August 07, 2021.  The plan is to continue with androgen deprivation therapy alone and monitor his pulmonary nodules.  Therapy escalation would be considered if these pulmonary nodules or confirmed to be metastatic prostate cancer. ? ?2.  Bilateral pulmonary nodules suspicious for metastatic prostate cancer.  The plan is to update imaging studies in the next 3 months after he has received adequate androgen deprivation therapy. ?  ?3.  Androgen deprivation therapy: Next Eligard will be given in April 2023 and repeated in 4 months. ?  ? ?  ?4.  CLL: No indication for treatment at this time.  His white cell count was 4.7 on August 10, 2021 ?  ?5.  Follow up: In 3 months for a follow-up visit. ?  ?30  minutes were spent on this encounter.  Time was dedicated to reviewing laboratory data, disease status update, treatment choices and future plan of care review. ?  ?  ?  ? ? ? ? ?Zola Button, MD ?3/2/20232:49 PM ? ?

## 2021-08-15 ENCOUNTER — Telehealth: Payer: Self-pay | Admitting: Oncology

## 2021-08-15 NOTE — Telephone Encounter (Signed)
Scheduled per 03/03 los, patient has been called and notified. ?

## 2021-08-30 DIAGNOSIS — I10 Essential (primary) hypertension: Secondary | ICD-10-CM | POA: Diagnosis not present

## 2021-08-30 DIAGNOSIS — E7800 Pure hypercholesterolemia, unspecified: Secondary | ICD-10-CM | POA: Diagnosis not present

## 2021-09-10 DIAGNOSIS — R3915 Urgency of urination: Secondary | ICD-10-CM | POA: Diagnosis not present

## 2021-09-10 DIAGNOSIS — R350 Frequency of micturition: Secondary | ICD-10-CM | POA: Diagnosis not present

## 2021-09-12 ENCOUNTER — Inpatient Hospital Stay: Payer: Medicare Other | Attending: Oncology

## 2021-09-12 ENCOUNTER — Telehealth: Payer: Self-pay | Admitting: Oncology

## 2021-09-12 ENCOUNTER — Other Ambulatory Visit: Payer: Self-pay

## 2021-09-12 VITALS — BP 153/90 | HR 64 | Temp 97.9°F | Resp 18

## 2021-09-12 DIAGNOSIS — C61 Malignant neoplasm of prostate: Secondary | ICD-10-CM | POA: Diagnosis present

## 2021-09-12 DIAGNOSIS — Z79899 Other long term (current) drug therapy: Secondary | ICD-10-CM | POA: Diagnosis not present

## 2021-09-12 MED ORDER — LEUPROLIDE ACETATE (4 MONTH) 30 MG ~~LOC~~ KIT
30.0000 mg | PACK | Freq: Once | SUBCUTANEOUS | Status: AC
  Administered 2021-09-12: 30 mg via SUBCUTANEOUS
  Filled 2021-09-12: qty 30

## 2021-09-12 NOTE — Telephone Encounter (Signed)
Patient has been called and notified of upcoming appointments. ?

## 2021-09-27 DIAGNOSIS — R3915 Urgency of urination: Secondary | ICD-10-CM | POA: Diagnosis not present

## 2021-10-03 ENCOUNTER — Telehealth: Payer: Self-pay | Admitting: *Deleted

## 2021-10-03 NOTE — Telephone Encounter (Signed)
RN returned call from message left by patient on triage voicemail.  He is scheduled to see Dr Tasia Catchings next week on 10/08/21 for a new patient visit and second opinion.  He was calling to see if it would be helpful for him to have labs drawn prior to visit at [facility].  RN explained to patient that providers usually see patient first for their initial visit and then have labs drawn after the visit if needed.  Pt verbalized understanding.  ?

## 2021-10-08 ENCOUNTER — Encounter: Payer: Self-pay | Admitting: Oncology

## 2021-10-08 ENCOUNTER — Inpatient Hospital Stay: Payer: Medicare Other | Attending: Oncology | Admitting: Oncology

## 2021-10-08 ENCOUNTER — Other Ambulatory Visit: Payer: Self-pay

## 2021-10-08 ENCOUNTER — Ambulatory Visit
Admission: RE | Admit: 2021-10-08 | Discharge: 2021-10-08 | Disposition: A | Discharge status: Home / Self Care | Payer: Self-pay | Source: Ambulatory Visit | Attending: Oncology | Admitting: Oncology

## 2021-10-08 ENCOUNTER — Telehealth: Payer: Self-pay

## 2021-10-08 ENCOUNTER — Inpatient Hospital Stay: Payer: Medicare Other

## 2021-10-08 VITALS — BP 140/75 | HR 60 | Temp 97.8°F | Resp 18 | Ht 73.0 in | Wt 209.4 lb

## 2021-10-08 DIAGNOSIS — Z7189 Other specified counseling: Secondary | ICD-10-CM | POA: Diagnosis not present

## 2021-10-08 DIAGNOSIS — C7800 Secondary malignant neoplasm of unspecified lung: Secondary | ICD-10-CM | POA: Diagnosis not present

## 2021-10-08 DIAGNOSIS — Z79899 Other long term (current) drug therapy: Secondary | ICD-10-CM | POA: Diagnosis not present

## 2021-10-08 DIAGNOSIS — C61 Malignant neoplasm of prostate: Secondary | ICD-10-CM

## 2021-10-08 DIAGNOSIS — C9110 Chronic lymphocytic leukemia of B-cell type not having achieved remission: Secondary | ICD-10-CM | POA: Insufficient documentation

## 2021-10-08 DIAGNOSIS — Z7982 Long term (current) use of aspirin: Secondary | ICD-10-CM | POA: Insufficient documentation

## 2021-10-08 LAB — CBC WITH DIFFERENTIAL/PLATELET
Abs Immature Granulocytes: 0.04 10*3/uL (ref 0.00–0.07)
Basophils Absolute: 0.1 10*3/uL (ref 0.0–0.1)
Basophils Relative: 1 %
Eosinophils Absolute: 0.1 10*3/uL (ref 0.0–0.5)
Eosinophils Relative: 1 %
HCT: 40.7 % (ref 39.0–52.0)
Hemoglobin: 13.6 g/dL (ref 13.0–17.0)
Immature Granulocytes: 0 %
Lymphocytes Relative: 46 %
Lymphs Abs: 5.9 10*3/uL — ABNORMAL HIGH (ref 0.7–4.0)
MCH: 30 pg (ref 26.0–34.0)
MCHC: 33.4 g/dL (ref 30.0–36.0)
MCV: 89.6 fL (ref 80.0–100.0)
Monocytes Absolute: 0.5 10*3/uL (ref 0.1–1.0)
Monocytes Relative: 4 %
Neutro Abs: 6.3 10*3/uL (ref 1.7–7.7)
Neutrophils Relative %: 48 %
Platelets: 289 10*3/uL (ref 150–400)
RBC: 4.54 MIL/uL (ref 4.22–5.81)
RDW: 13.7 % (ref 11.5–15.5)
Smear Review: NORMAL
WBC: 12.9 10*3/uL — ABNORMAL HIGH (ref 4.0–10.5)
nRBC: 0 % (ref 0.0–0.2)

## 2021-10-08 LAB — COMPREHENSIVE METABOLIC PANEL
ALT: 17 U/L (ref 0–44)
AST: 17 U/L (ref 15–41)
Albumin: 3.5 g/dL (ref 3.5–5.0)
Alkaline Phosphatase: 55 U/L (ref 38–126)
Anion gap: 6 (ref 5–15)
BUN: 21 mg/dL (ref 8–23)
CO2: 25 mmol/L (ref 22–32)
Calcium: 8.7 mg/dL — ABNORMAL LOW (ref 8.9–10.3)
Chloride: 104 mmol/L (ref 98–111)
Creatinine, Ser: 0.76 mg/dL (ref 0.61–1.24)
GFR, Estimated: >60 mL/min (ref >60)
Glucose, Bld: 121 mg/dL — ABNORMAL HIGH (ref 70–99)
Potassium: 3.7 mmol/L (ref 3.5–5.1)
Sodium: 135 mmol/L (ref 135–145)
Total Bilirubin: 1 mg/dL (ref 0.3–1.2)
Total Protein: 7 g/dL (ref 6.5–8.1)

## 2021-10-08 LAB — PSA: Prostatic Specific Antigen: 0.05 ng/mL (ref 0.00–4.00)

## 2021-10-08 NOTE — Progress Notes (Signed)
?Hematology/Oncology Consult note ?[phone] [phone] ?  ? ?   ? ? ?Patient Care Team: ?Lajean Manes, MD as PCP - General (Internal Medicine) ?Frederik Pear, MD as Attending Physician (Orthopedic Surgery) ?Heath Lark, MD as Consulting Physician (Hematology and Oncology) ?Katheren Puller, RN as Oncology Nurse Navigator ? ?REFERRING PROVIDER: ?Lajean Manes, MD  ?CHIEF COMPLAINTS/REASON FOR VISIT:  ?Evaluation of prostate cancer ? ?HISTORY OF PRESENTING ILLNESS:  ? ?Brian Bean is a  81 y.o.  male with PMH listed below was seen in consultation at the request of  Lajean Manes, MD  for evaluation of prostate cancer. ? ?Patient's oncology care was previously with Dr. Zola Button.  Patient presents for second opinion and prefers to transfer care to our cancer center. ? ?I reviewed patient's previous oncology records. ? ?#History of CLL, currently on active surveillance since 2014. ?#History of prostate cancer, initially diagnosed in June 2015, Gleason score of 6, patient has been on active surveillance between 2015-2019, PSA gradually increasing up to 16.9. ?Received definitive radiation in 2019 without continued evaluation.  PSA decreased to 0.6. ?Per note, November 2021, PSA 2.5 ?May 2022, PSA 4.3. ?12/28/2020, PSMA at [facility] showed multiple bilateral radiotracer avid pulmonary nodules.  For example right upper lobe 1.2 x 1.4 cm and the left lower lobe 1.5 x 1.3 cm.  Prostate showed no focal radiotracer uptake to suggest local recurrence.  No PSMA avid pelvic, retroperitoneal, or mesenteric adenopathy.  Multiple pulmonary radiotracer avid nodules concerning for metastatic disease.  No osseous metastatic disease.  Severe three-vessel coronary artery calcifications. ? ?04/16/2021, patient underwent biopsy via bronchoscopy. ?Lung R LL biopsy showed malignant cells consistent with non-small cell carcinoma. ?Lung RLL brushing showed malignant cells consistent with non-small cell carcinoma, ?Lung R  UL brushing showed no malignant cells. ?Lung R UL biopsy showed malignant cells consistent with non-small cell carcinoma. ?Lung RUL lavage showed no malignant cells identified. ?Immunostains for TTF-1, Napsin A, p63 and CK5/6 were attempted but are noncontributory.  There is insufficient tumor tissue on the cellblock for additional studies. ? ? ?December 2022, PSA 8.7 ?05/10/2021, started on androgen deprivation therapy. ?Patient has been on Eligard 30 mg every 4 months.  Recently received in April 2023. ?08/07/2021, PSA 0.2. ?He had blood work done recently with urology.  PSA has decreased to 0.1. ? ?Patient has questions about whether he has metastatic prostate cancer with lung metastasis or not.  He would like to have some literature/case study to read. ? ?Patient has had some weight loss since December 2022.  He noticed hot flash, fatigue after he started on Eligard. ? ?Review of Systems  ?Constitutional:  Positive for fatigue and unexpected weight change. Negative for appetite change, chills and fever.  ?HENT:   Negative for hearing loss and voice change.   ?Eyes:  Negative for eye problems and icterus.  ?Respiratory:  Negative for chest tightness, cough and shortness of breath.   ?Cardiovascular:  Negative for chest pain and leg swelling.  ?Gastrointestinal:  Negative for abdominal distention and abdominal pain.  ?Endocrine: Positive for hot flashes.  ?Genitourinary:  Negative for difficulty urinating, dysuria and frequency.   ?Musculoskeletal:  Negative for arthralgias.  ?Skin:  Negative for itching and rash.  ?Neurological:  Negative for light-headedness and numbness.  ?Hematological:  Negative for adenopathy. Does not bruise/bleed easily.  ?Psychiatric/Behavioral:  Negative for confusion.   ? ?MEDICAL HISTORY:  ?Past Medical History:  ?Diagnosis Date  ? Anxiety   ? Arthritis   ? Cancer [facility])   ?  skin lesion scalp  ? CLL (chronic lymphocytic leukemia) ([place]) 01/06/2013  ? CLL (chronic lymphocytic leukemia) ([place])    ? Depression   ? Head injury   ? fell from a horse  ? Hypertension   ? Lymphocytosis   ? PONV (postoperative nausea and vomiting)   ? extremely sick did not get sick after hip surgery in 2013  ? Prostate cancer ([place])   ? ? ?SURGICAL HISTORY: ?Past Surgical History:  ?Procedure Laterality Date  ? APPENDECTOMY    ? BRONCHIAL BIOPSY  04/16/2021  ? Procedure: BRONCHIAL BIOPSIES;  Surgeon: [facility] Nash, DO;  Location: [facility];  Service: Pulmonary;;  ? BRONCHIAL BRUSHINGS  04/16/2021  ? Procedure: BRONCHIAL BRUSHINGS;  Surgeon: Garner Nash, DO;  Location: [facility];  Service: Pulmonary;;  ? BRONCHIAL NEEDLE ASPIRATION BIOPSY  04/16/2021  ? Procedure: BRONCHIAL NEEDLE ASPIRATION BIOPSIES;  Surgeon: [facility] Nash, DO;  Location: [place];  Service: Pulmonary;;  ? BRONCHIAL WASHINGS  04/16/2021  ? Procedure: BRONCHIAL WASHINGS;  Surgeon: [facility] Nash, DO;  Location: [facility];  Service: Pulmonary;;  ? CATARACT EXTRACTION, BILATERAL    ? CERVICAL DISC SURGERY  00  ? COLONOSCOPY  2011  ? [facility].   ? FRACTURE SURGERY    ? rt ankle  ? HERNIA REPAIR    ? rt and  undistended testicle  ? PROSTATE BIOPSY    ? REVERSE SHOULDER ARTHROPLASTY Right 05/01/2020  ? Procedure: REVERSE SHOULDER ARTHROPLASTY;  Surgeon: Corky Mull, MD;  Location: [facility];  Service: Orthopedics;  Laterality: Right;  ? rt hip  12  ? pinned  ? TOTAL HIP ARTHROPLASTY  05/03/2012  ? Procedure: TOTAL HIP ARTHROPLASTY;  Surgeon: Kerin Salen, MD;  Location: [place];  Service: Orthopedics;  Laterality: Right;  ? VIDEO BRONCHOSCOPY WITH ENDOBRONCHIAL NAVIGATION Right 04/16/2021  ? Procedure: VIDEO BRONCHOSCOPY WITH ENDOBRONCHIAL NAVIGATION;  Surgeon: [facility] Nash, DO;  Location: [place];  Service: Pulmonary;  Laterality: Right;  ION w/ CIOS  ? VIDEO BRONCHOSCOPY WITH RADIAL ENDOBRONCHIAL ULTRASOUND  04/16/2021  ? Procedure: RADIAL ENDOBRONCHIAL ULTRASOUND;  Surgeon: [facility] Nash, DO;  Location: [facility];  Service:  Pulmonary;;  ? ? ?SOCIAL HISTORY: ?Social History  ? ?Socioeconomic History  ? Marital status: Divorced  ?  Spouse name: Not on file  ? Number of children: 1  ? Years of education: Not on file  ? Highest education level: Not on file  ?Occupational History  ?  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 Smoking status: Former  ?  Packs/day: 0.50  ?  Years: 10.00  ?  Pack years: 5.00  ?  Types: Cigarettes  ?  Quit date: 04/28/1977  ?  Years since quitting: 44.4  ? Smokeless tobacco: Never  ? Tobacco comments:  ?  10 oz alcohol wkly  ?Vaping Use  ? Vaping Use: Never used  ?Substance and Sexual Activity  ? Alcohol use: Yes  ?  Alcohol/week: 21.0 standard drinks  ?  Types: 5 Glasses of wine, 16 Shots of liquor per week  ?  Comment: 14 ounces a week  ? Drug use: No  ? Sexual activity: Yes  ?Other Topics Concern  ? Not on file  ?Social History Narrative  ? Retired.  ? Significant other: Nicanor Bake  ? One son, Saralyn Pilar  ? ?Social Determinants of Health  ? ?Financial Resource Strain: Not on file  ?Food Insecurity: Not on file  ?Transportation Needs: Not on file  ?  Physical Activity: Not on file  ?Stress: Not on file  ?Social Connections: Not on file  ?Intimate Partner Violence: Not on file  ? ? ?FAMILY HISTORY: ?Family History  ?Problem Relation Age of Onset  ? Heart disease Mother   ? Cancer Father   ?     unk type of cancer  ? ? ?ALLERGIES:  has No Known Allergies. ? ?MEDICATIONS:  ?Current Outpatient Medications  ?Medication Sig Dispense Refill  ? amLODipine (NORVASC) 5 MG tablet Take 5 mg by mouth 2 (two) times daily.     ? aspirin EC 81 MG tablet Take 81 mg by mouth daily. Swallow whole.    ? atorvastatin (LIPITOR) 20 MG tablet Take 20 mg by mouth daily.    ? carvedilol (COREG) 12.5 MG tablet Take 12.5 mg by mouth 2 (two) times daily with a meal.     ? fluorouracil (EFUDEX) 5 % cream Apply 1 application topically 2 (two) times daily as needed (precancerous spots).    ? FLUoxetine (PROZAC) 40 MG  capsule Take 80 mg by mouth daily.    ? fluticasone (FLONASE ALLERGY RELIEF) 50 MCG/ACT nasal spray Place 1 spray into both nostrils daily as needed for allergies or rhinitis.    ? hydrochlorothiazide (HYDROD

## 2021-10-08 NOTE — Telephone Encounter (Signed)
Images requested for PSMA PET from 12/28/20 done at [facility].  ?

## 2021-10-09 ENCOUNTER — Encounter: Payer: Self-pay | Admitting: Urology

## 2021-10-10 ENCOUNTER — Encounter: Payer: Self-pay | Admitting: Oncology

## 2021-10-10 NOTE — Telephone Encounter (Signed)
Images available in PACS.

## 2021-10-11 ENCOUNTER — Telehealth: Payer: Self-pay | Admitting: *Deleted

## 2021-10-11 NOTE — Telephone Encounter (Signed)
Pt left message requesting results from recent labwork be faxed to Dr. Louis Meckel at [facility] at fax number [phone]. Results have been faxed to number provided.  ?

## 2021-10-12 ENCOUNTER — Encounter: Payer: Self-pay | Admitting: Oncology

## 2021-10-23 ENCOUNTER — Ambulatory Visit
Admission: RE | Admit: 2021-10-23 | Discharge: 2021-10-23 | Disposition: A | Discharge status: Home / Self Care | Payer: Medicare Other | Source: Ambulatory Visit | Attending: Oncology | Admitting: Oncology

## 2021-10-23 DIAGNOSIS — C7800 Secondary malignant neoplasm of unspecified lung: Secondary | ICD-10-CM

## 2021-10-23 DIAGNOSIS — K4090 Unilateral inguinal hernia, without obstruction or gangrene, not specified as recurrent: Secondary | ICD-10-CM | POA: Diagnosis not present

## 2021-10-23 DIAGNOSIS — C61 Malignant neoplasm of prostate: Secondary | ICD-10-CM | POA: Insufficient documentation

## 2021-10-23 DIAGNOSIS — I2510 Atherosclerotic heart disease of native coronary artery without angina pectoris: Secondary | ICD-10-CM | POA: Diagnosis not present

## 2021-10-23 DIAGNOSIS — C7801 Secondary malignant neoplasm of right lung: Secondary | ICD-10-CM | POA: Diagnosis not present

## 2021-10-23 DIAGNOSIS — C7802 Secondary malignant neoplasm of left lung: Secondary | ICD-10-CM | POA: Insufficient documentation

## 2021-10-23 MED ORDER — PIFLIFOLASTAT F 18 (PYLARIFY) INJECTION
9.0000 | Freq: Once | INTRAVENOUS | Status: AC
  Administered 2021-10-23: 9.02 via INTRAVENOUS

## 2021-10-30 ENCOUNTER — Telehealth: Payer: Self-pay

## 2021-10-30 NOTE — Telephone Encounter (Signed)
Pt informed and verbalized understanding. Please schedule MD only within the next few weeks and inform pt of appt.

## 2021-10-30 NOTE — Telephone Encounter (Signed)
-----   Message from Earlie Server, MD sent at 10/29/2021 11:14 PM EDT ----- Patient know that his PET scan showed slight improvement of the lung nodules, indicating good response to the androgen deprivation therapy.  Please schedule patient to follow-up with me in the next few weeks MD only to go over future plans.

## 2021-11-07 ENCOUNTER — Ambulatory Visit (HOSPITAL_COMMUNITY)
Admission: RE | Admit: 2021-11-07 | Discharge: 2021-11-07 | Disposition: A | Discharge status: Home / Self Care | Payer: Medicare Other | Source: Ambulatory Visit | Attending: Oncology | Admitting: Oncology

## 2021-11-07 DIAGNOSIS — C61 Malignant neoplasm of prostate: Secondary | ICD-10-CM | POA: Diagnosis present

## 2021-11-07 DIAGNOSIS — R918 Other nonspecific abnormal finding of lung field: Secondary | ICD-10-CM | POA: Insufficient documentation

## 2021-11-07 DIAGNOSIS — R911 Solitary pulmonary nodule: Secondary | ICD-10-CM | POA: Diagnosis not present

## 2021-11-08 DIAGNOSIS — C7801 Secondary malignant neoplasm of right lung: Secondary | ICD-10-CM | POA: Diagnosis not present

## 2021-11-08 DIAGNOSIS — Z79899 Other long term (current) drug therapy: Secondary | ICD-10-CM | POA: Diagnosis not present

## 2021-11-08 DIAGNOSIS — I700 Atherosclerosis of aorta: Secondary | ICD-10-CM | POA: Diagnosis not present

## 2021-11-08 DIAGNOSIS — C7802 Secondary malignant neoplasm of left lung: Secondary | ICD-10-CM | POA: Diagnosis not present

## 2021-11-08 DIAGNOSIS — E7800 Pure hypercholesterolemia, unspecified: Secondary | ICD-10-CM | POA: Diagnosis not present

## 2021-11-08 DIAGNOSIS — C9110 Chronic lymphocytic leukemia of B-cell type not having achieved remission: Secondary | ICD-10-CM | POA: Diagnosis not present

## 2021-11-08 DIAGNOSIS — I10 Essential (primary) hypertension: Secondary | ICD-10-CM | POA: Diagnosis not present

## 2021-11-12 DIAGNOSIS — Z79899 Other long term (current) drug therapy: Secondary | ICD-10-CM | POA: Diagnosis not present

## 2021-11-14 ENCOUNTER — Inpatient Hospital Stay: Payer: Medicare Other | Admitting: Oncology

## 2021-11-15 ENCOUNTER — Encounter: Payer: Self-pay | Admitting: Oncology

## 2021-11-15 ENCOUNTER — Encounter: Payer: Self-pay | Admitting: Geriatric Medicine

## 2021-11-15 ENCOUNTER — Inpatient Hospital Stay: Payer: Medicare Other | Attending: Oncology | Admitting: Oncology

## 2021-11-15 VITALS — BP 132/77 | HR 70 | Temp 98.7°F | Resp 20 | Wt 217.8 lb

## 2021-11-15 DIAGNOSIS — C9110 Chronic lymphocytic leukemia of B-cell type not having achieved remission: Secondary | ICD-10-CM | POA: Insufficient documentation

## 2021-11-15 DIAGNOSIS — C61 Malignant neoplasm of prostate: Secondary | ICD-10-CM | POA: Diagnosis not present

## 2021-11-15 DIAGNOSIS — Z79899 Other long term (current) drug therapy: Secondary | ICD-10-CM | POA: Diagnosis not present

## 2021-11-15 DIAGNOSIS — Z8546 Personal history of malignant neoplasm of prostate: Secondary | ICD-10-CM | POA: Insufficient documentation

## 2021-11-15 DIAGNOSIS — Z7189 Other specified counseling: Secondary | ICD-10-CM | POA: Diagnosis not present

## 2021-11-16 NOTE — Assessment & Plan Note (Addendum)
Rai stage 0, physical examination did not reveal any significant lymphadenopathy/organomegaly.   Continue watchful waiting.

## 2021-11-16 NOTE — Assessment & Plan Note (Addendum)
Stage IV castration sensitive prostate cancer with multiple radiotracer avid lung nodules on PSMA. Previous lung nodule biopsy results are consistent with malignant cells, non-small cell carcinoma, not enough tissue for further work-up for tissue origin.  PMSA PET scan showed interval decrease of pulmonary nodules.  Consistent with treatment response Continue Adrogen deprivation therapy.-Lupron 30 mg every 4 months, next due around February 05, 2022 Discussed about the rationale upfront chemotherapy with Docetaxel +/- oral agents.  Patient is interested in docetaxel chemotherapy. Potential side effects were reviewed and discussed with patient. Chemotherapy education was provided.  We had discussed the composition of chemotherapy regimen, length of chemo cycle, duration of treatment and the time to assess response to treatment.    I explained to the patient the risks and benefits of Docetaxel chemotherapy including but not limited to hair loss, infusion reactions, mouth sore, nausea, vomiting, diarrhea, low blood counts, bleeding, neuropathy and risk of life threatening infection and even death, secondary malignancy etc. Patient voices understanding and willing to proceed chemotherapy.   # Chemotherapy education;Antiemetics-Zofran and Compazine; EMLA cream sent to pharmacy

## 2021-11-16 NOTE — Progress Notes (Signed)
Hematology/Oncology Progress note [phone] [phone]            Patient Care Team: Lajean Manes, MD as PCP - General (Internal Medicine) Frederik Pear, MD as Attending Physician (Orthopedic Surgery) Heath Lark, MD as Consulting Physician (Hematology and Oncology) Katheren Puller, RN as Oncology Nurse Navigator  REFERRING PROVIDER: Lajean Manes, MD  CHIEF COMPLAINTS/REASON FOR VISIT:  Prostate cancer  ASSESSMENT & PLAN:   Prostate cancer ([place]) Stage IV castration sensitive prostate cancer with multiple radiotracer avid lung nodules on PSMA. Previous lung nodule biopsy results are consistent with malignant cells, non-small cell carcinoma, not enough tissue for further work-up for tissue origin.  PMSA PET scan showed interval decrease of pulmonary nodules.  Consistent with treatment response Continue Adrogen deprivation therapy.-Lupron 30 mg every 4 months, next due around February 05, 2022 Discussed about the rationale upfront chemotherapy with Docetaxel +/- oral agents.  Patient is interested in docetaxel chemotherapy. Potential side effects were reviewed and discussed with patient. Chemotherapy education was provided.  We had discussed the composition of chemotherapy regimen, length of chemo cycle, duration of treatment and the time to assess response to treatment.    I explained to the patient the risks and benefits of Docetaxel chemotherapy including but not limited to hair loss, infusion reactions, mouth sore, nausea, vomiting, diarrhea, low blood counts, bleeding, neuropathy and risk of life threatening infection and even death, secondary malignancy etc. Patient voices understanding and willing to proceed chemotherapy.   # Chemotherapy education;Antiemetics-Zofran and Compazine; EMLA cream sent to pharmacy   CLL (chronic lymphocytic leukemia) ([place]) Rai stage 0, physical examination did not reveal any significant lymphadenopathy/organomegaly.    Continue watchful waiting.   Goals of care, counseling/discussion Goals of care was reviewed and discussed with patient.  Orders Placed This Encounter  Procedures   CBC with Differential    Standing Status:   Standing    Number of Occurrences:   20    Standing Expiration Date:   11/18/2022   Comprehensive metabolic panel    Standing Status:   Standing    Number of Occurrences:   20    Standing Expiration Date:   11/18/2022   PHYSICIAN COMMUNICATION ORDER    Pathway POS96 x 6 cycles with ADT.     All questions were answered. The patient knows to call the clinic with any problems questions or concerns. Follow up  Chemotherapy education- Docetaxel.  2-3 weeks -Lab MD Docetaxel with D3 GCSF  Earlie Server, MD, PhD [facility] Hematology Oncology 11/15/2021    HISTORY OF PRESENTING ILLNESS:   Brian Bean is a  81 y.o.  male with PMH listed below was seen in consultation at the request of  Lajean Manes, MD  for evaluation of prostate cancer.  Patient's oncology care was previously with Dr. Zola Button.  Patient presents for second opinion and prefers to transfer care to our cancer center. I reviewed patient's previous oncology records.  Oncology History  CLL (chronic lymphocytic leukemia) ([place])  07/08/2012 Pathology Results   Accession #: [id number]  peripheral blood comfirmed CLL   2015 Initial Diagnosis   #History of prostate cancer, initially diagnosed in June 2015, Gleason score of 6, patient has been on active surveillance between 2015-2019, PSA gradually increasing up to 16.9.   06/24/2016 Pathology Results   CLL FISH showed 3.67% positive for deletion 13 q and 5% positive for p53 deletion   12/28/2020 Imaging   12/28/2020, PSMA at [facility] showed multiple bilateral radiotracer avid pulmonary nodules.  For example right upper lobe 1.2 x 1.4 cm and the left lower lobe 1.5 x 1.3 cm.  Prostate showed no focal radiotracer uptake to suggest local recurrence.  No PSMA avid pelvic,  retroperitoneal, or mesenteric adenopathy.  Multiple pulmonary radiotracer avid nodules concerning for metastatic disease.  No osseous metastatic disease.  Severe three-vessel coronary artery calcifications   01/10/2021 Cancer Staging   Staging form: Chronic Lymphocytic Leukemia / Small Lymphocytic Lymphoma, AJCC 8th Edition - Clinical stage from 01/10/2021: Modified Rai Stage 0 (Modified Rai risk: Low, Lymphocytosis: Present, Adenopathy: Absent, Organomegaly: Absent, Anemia: Absent, Thrombocytopenia: Absent) - Signed by Heath Lark, MD on 01/10/2021 Stage prefix: Initial diagnosis   04/16/2021 Initial Biopsy   patient underwent biopsy via bronchoscopy. Lung R LL biopsy showed malignant cells consistent with non-small cell carcinoma. Lung RLL brushing showed malignant cells consistent with non-small cell carcinoma, Lung R UL brushing showed no malignant cells. Lung R UL biopsy showed malignant cells consistent with non-small cell carcinoma. Lung RUL lavage showed no malignant cells identified. Immunostains for TTF-1, Napsin A, p63 and CK5/6 were attempted but are noncontributory.  There is insufficient tumor tissue on the cellblock for additional studies.     05/2021 Tumor Marker   PSA 8.7   05/10/2021 Miscellaneous   05/10/2021, started on androgen deprivation therapy. Eligard 30 mg every 4 months   08/2021 Tumor Marker   PSA 0.2   Prostate cancer ([place])  2015 Initial Diagnosis   #History of prostate cancer, initially diagnosed in June 2015, Gleason score of 6, patient has been on active surveillance between 2015-2019, PSA gradually increasing up to 16.9.   06/24/2016 Initial Diagnosis   Prostate cancer ([place])   10/08/2021 Cancer Staging   Staging form: Prostate, AJCC 8th Edition - Clinical: Stage IVB (ycTX, cNX, cM1) - Signed by Earlie Server, MD on 10/08/2021 Stage prefix: Post-therapy    INTERVAL HISTORY Brian Bean is a 81 y.o. male who has above history reviewed by me today presents for  follow up visit for prostate cancer. Patient reports feeling well.  Patient has chronic fatigue due to being on edge and radiation therapy. Patient has no new complaints today.  Review of Systems  Constitutional:  Positive for fatigue. Negative for appetite change, chills, fever and unexpected weight change.  HENT:   Negative for hearing loss and voice change.   Eyes:  Negative for eye problems and icterus.  Respiratory:  Negative for chest tightness, cough and shortness of breath.   Cardiovascular:  Negative for chest pain and leg swelling.  Gastrointestinal:  Negative for abdominal distention and abdominal pain.  Endocrine: Positive for hot flashes.  Genitourinary:  Negative for difficulty urinating, dysuria and frequency.   Musculoskeletal:  Negative for arthralgias.  Skin:  Negative for itching and rash.  Neurological:  Negative for light-headedness and numbness.  Hematological:  Negative for adenopathy. Does not bruise/bleed easily.  Psychiatric/Behavioral:  Negative for confusion.     MEDICAL HISTORY:  Past Medical History:  Diagnosis Date   Anxiety    Arthritis    Cancer ([place])    skin lesion scalp   CLL (chronic lymphocytic leukemia) ([place]) 01/06/2013   CLL (chronic lymphocytic leukemia) ([place])    Depression    Head injury    fell from a horse   Hypertension    Lymphocytosis    PONV (postoperative nausea and vomiting)    extremely sick did not get sick after hip surgery in 2013   Prostate cancer ([place])  SURGICAL HISTORY: Past Surgical History:  Procedure Laterality Date   APPENDECTOMY     BRONCHIAL BIOPSY  04/16/2021   Procedure: BRONCHIAL BIOPSIES;  Surgeon: Garner Nash, DO;  Location: [facility];  Service: Pulmonary;;   BRONCHIAL BRUSHINGS  04/16/2021   Procedure: BRONCHIAL BRUSHINGS;  Surgeon: Garner Nash, DO;  Location: [facility];  Service: Pulmonary;;   BRONCHIAL NEEDLE ASPIRATION BIOPSY  04/16/2021   Procedure: BRONCHIAL NEEDLE ASPIRATION  BIOPSIES;  Surgeon: Garner Nash, DO;  Location: [facility];  Service: Pulmonary;;   BRONCHIAL WASHINGS  04/16/2021   Procedure: BRONCHIAL WASHINGS;  Surgeon: Garner Nash, DO;  Location: [facility];  Service: Pulmonary;;   CATARACT EXTRACTION, BILATERAL     CERVICAL DISC SURGERY  00   COLONOSCOPY  2011   [place]     rt ankle   HERNIA REPAIR     rt and  undistended testicle   PROSTATE BIOPSY     REVERSE SHOULDER ARTHROPLASTY Right 05/01/2020   Procedure: REVERSE SHOULDER ARTHROPLASTY;  Surgeon: Corky Mull, MD;  Location: [facility];  Service: Orthopedics;  Laterality: Right;   rt hip  12   pinned   TOTAL HIP ARTHROPLASTY  05/03/2012   Procedure: TOTAL HIP ARTHROPLASTY;  Surgeon: Kerin Salen, MD;  Location: [facility];  Service: Orthopedics;  Laterality: Right;   VIDEO BRONCHOSCOPY WITH ENDOBRONCHIAL NAVIGATION Right 04/16/2021   Procedure: VIDEO BRONCHOSCOPY WITH ENDOBRONCHIAL NAVIGATION;  Surgeon: Garner Nash, DO;  Location: [facility];  Service: Pulmonary;  Laterality: Right;  ION w/ CIOS   VIDEO BRONCHOSCOPY WITH RADIAL ENDOBRONCHIAL ULTRASOUND  04/16/2021   Procedure: RADIAL ENDOBRONCHIAL ULTRASOUND;  Surgeon: Garner Nash, DO;  Location: [facility];  Service: Pulmonary;;    SOCIAL HISTORY: Social History   Socioeconomic History   Marital status: Divorced    Spouse name: Not on file   Number of children: 1   Years of education: Not on file   Highest education level: Not on file  Occupational History    Comment: retired Copywriter, advertising ([employer]  Tobacco Use   Smoking status: Former    Packs/day: 0.50    Years: 10.00    Total pack years: 5.00    Types: Cigarettes    Quit date: 04/28/1977    Years since quitting: 44.5   Smokeless tobacco: Never   Tobacco comments:    10 oz alcohol wkly  Vaping Use   Vaping Use: Never used  Substance and Sexual Activity   Alcohol use: Yes    Alcohol/week: 21.0 standard drinks of  alcohol    Types: 5 Glasses of wine, 16 Shots of liquor per week    Comment: 14 ounces a week   Drug use: No   Sexual activity: Yes  Other Topics Concern   Not on file  Social History Narrative   Retired.   Significant other: Nicanor Bake   One son, Saralyn Pilar   Social Determinants of Health   Financial Resource Strain: Not on [employer] Insecurity: Not on file  Transportation Needs: Not on file  Physical Activity: Not on file  Stress: Not on file  Social Connections: Not on file  Intimate Partner Violence: Not on file    FAMILY HISTORY: Family History  Problem Relation Age of Onset   Heart disease Mother    Cancer Father        unk type of cancer    ALLERGIES:  has No Known Allergies.  MEDICATIONS:  Current Outpatient Medications  Medication Sig Dispense Refill   amLODipine (NORVASC) 5 MG tablet Take 5 mg by mouth 2 (two) times daily.      aspirin EC 81 MG tablet Take 81 mg by mouth daily. Swallow whole.     atorvastatin (LIPITOR) 20 MG tablet Take 20 mg by mouth daily.     carvedilol (COREG) 12.5 MG tablet Take 12.5 mg by mouth 2 (two) times daily with a meal.      fluorouracil (EFUDEX) 5 % cream Apply 1 application topically 2 (two) times daily as needed (precancerous spots).     FLUoxetine (PROZAC) 40 MG capsule Take 80 mg by mouth daily.     fluticasone (FLONASE ALLERGY RELIEF) 50 MCG/ACT nasal spray Place 1 spray into both nostrils daily as needed for allergies or rhinitis.     hydrochlorothiazide (HYDRODIURIL) 25 MG tablet Take 25 mg by mouth daily.      IPRATROPIUM BROMIDE IN Inhale into the lungs as needed.     ketoconazole (NIZORAL) 2 % shampoo Apply 1 application topically daily as needed for irritation.     lisinopril (PRINIVIL,ZESTRIL) 10 MG tablet Take 10 mg by mouth daily.     Melatonin 10 MG TABS Take 10 mg by mouth at bedtime.     naproxen sodium (ALEVE) 220 MG tablet Take 220 mg by mouth daily as needed (pain).     tadalafil (CIALIS) 20 MG tablet Take 20  mg by mouth daily as needed for erectile dysfunction.     tamsulosin (FLOMAX) 0.4 MG CAPS capsule Take 0.4 mg by mouth daily.     No current facility-administered medications for this visit.     PHYSICAL EXAMINATION: ECOG PERFORMANCE STATUS: 1 - Symptomatic but completely ambulatory Vitals:   11/15/21 1210  BP: 132/77  Pulse: 70  Resp: 20  Temp: 98.7 F (37.1 C)  SpO2: 97%   Filed Weights   11/15/21 1210  Weight: 217 lb 12.8 oz (98.8 kg)    Physical Exam Constitutional:      General: He is not in acute distress. HENT:     Head: Normocephalic and atraumatic.  Eyes:     General: No scleral icterus. Cardiovascular:     Rate and Rhythm: Normal rate and regular rhythm.     Heart sounds: Normal heart sounds.  Pulmonary:     Effort: Pulmonary effort is normal. No respiratory distress.     Breath sounds: No wheezing.  Abdominal:     General: Bowel sounds are normal. There is no distension.     Palpations: Abdomen is soft.  Musculoskeletal:        General: No deformity. Normal range of motion.     Cervical back: Normal range of motion and neck supple.  Skin:    General: Skin is warm and dry.     Findings: No erythema or rash.  Neurological:     Mental Status: He is alert and oriented to person, place, and time. Mental status is at baseline.     Cranial Nerves: No cranial nerve deficit.     Coordination: Coordination normal.  Psychiatric:        Mood and Affect: Mood normal.     LABORATORY DATA:  I have reviewed the data as listed Lab Results  Component Value Date   WBC 12.9 (H) 10/08/2021   HGB 13.6 10/08/2021   HCT 40.7 10/08/2021   MCV 89.6 10/08/2021   PLT 289 10/08/2021   Recent Labs    04/16/21 0837  05/09/21 0936 10/08/21 1157  NA 135 140 135  K 4.2 3.6 3.7  CL 103 104 104  CO2 23 26 25   GLUCOSE 107* 113* 121*  BUN 18 16 21   CREATININE 0.85 0.93 0.76  CALCIUM 8.7* 8.9 8.7*  GFRNONAA >60 >60 >60  PROT  --  6.7 7.0  ALBUMIN  --  3.9 3.5  AST   --  20 17  ALT  --  26 17  ALKPHOS  --  54 55  BILITOT  --  0.7 1.0    Iron/TIBC/Ferritin/ %Sat No results found for: "IRON", "TIBC", "FERRITIN", "IRONPCTSAT"    RADIOGRAPHIC STUDIES: I have personally reviewed the radiological images as listed and agreed with the findings in the report. CT Chest Wo Contrast  Result Date: 11/08/2021 CLINICAL DATA:  Prostate cancer, radiotracer avid pulmonary nodules on PSMA PET. * Tracking Code: BO * EXAM: CT CHEST WITHOUT CONTRAST TECHNIQUE: Multidetector CT imaging of the chest was performed following the standard protocol without IV contrast. RADIATION DOSE REDUCTION: This exam was performed according to the departmental dose-optimization program which includes automated exposure control, adjustment of the mA and/or kV according to patient size and/or use of iterative reconstruction technique. COMPARISON:  10/23/2021 FINDINGS: Cardiovascular: Coronary, aortic arch, and branch vessel atherosclerotic vascular disease. Mediastinum/Nodes: Numerous scattered mostly small mediastinal lymph nodes are present., although these have increased in size compared to 04/16/2021. For example, a left paratracheal node measuring 1.0 cm in diameter on image 50 of series 2 previously measured 0.6 cm in short axis diameter on 04/16/2021. A right paratracheal node measures 1.0 cm in short axis on image 30 series 2, formerly 0.7 cm. However, there is been reduction in size of the right subpectoral lymph nodes, with the more medial of the subpectoral lymph nodes measuring 1.0 cm in short axis on image 23 series 2, formerly 1.2 cm. Lungs/Pleura: Given that the recent PSMA PET is from less than 2 weeks ago, lung nodule sizes are compared to the prior diagnostic CT chest from 04/16/2021. The scattered lung nodules are mostly reduced in size compared to 04/16/2021. For example, index nodule in the superior segment right lower lobe measures 1.5 by 1.1 cm on image 48 series 5, formerly 1.9 by 1.9  cm. Index left lower lobe nodule 1.3 by 1.1 cm on image 94 series 5, formerly 1.8 by 1.5 cm. The other scattered pulmonary nodules are likewise mildly reduced in size compared to 04/16/2021. I do not see any new nodules. Upper Abdomen: Nonspecific 8 mm hypodensity near the gallbladder fossa of the liver on image 147 series 2. Atherosclerosis is present, including aortoiliac atherosclerotic disease. Scattered diverticula of the transverse colon. Musculoskeletal: Right shoulder arthroplasty. Old left rib deformities and old left inferior scapular deformity. Thoracic kyphosis and spondylosis. No compelling findings of osseous metastatic disease in the chest. IMPRESSION: 1. The scattered pulmonary nodules are mildly reduced in size compared to 04/16/2021. No new nodules identified. 2. Scattered small to borderline enlarged mediastinal lymph nodes are slightly increased in size from 04/16/2021, although right subpectoral lymph nodes are reduced in size. 3.  Aortic Atherosclerosis (ICD10-I70.0).  Coronary atherosclerosis. Electronically Signed   By: Van Clines M.D.   On: 11/08/2021 10:58   NM PET (PSMA) SKULL TO MID THIGH  Result Date: 10/24/2021 CLINICAL DATA:  Metastatic prostate carcinoma. EXAM: NUCLEAR MEDICINE PET SKULL BASE TO THIGH TECHNIQUE: 9.0 mCi F18 Piflufolastat (Pylarify) was injected intravenously. Full-ring PET imaging was performed from the skull base to thigh after the  radiotracer. CT data was obtained and used for attenuation correction and anatomic localization. COMPARISON:  Outside PET-CT from [facility] dated 12/28/2020 FINDINGS: NECK No radiotracer activity in neck lymph nodes. Incidental CT finding: None CHEST No radiotracer accumulation within mediastinal or hilar lymph nodes. Multiple small pulmonary nodules are again seen bilaterally which show radiopharmaceutical uptake. Several of these nodules show mild decrease in size since previous study. Index nodule in the posterior left lower lobe  on image 119/2 measures 12 mm, compared to 15 mm previously. This has SUV max of 12.6 on today's exam. Another index nodule in the posterior right upper lobe abutting the fissure measures 11 mm on image 92/2, compared to 14 mm previously. This has SUV max of 12.4 on today's exam. No new or enlarging pulmonary nodules are seen on CT images. Incidental CT finding: Aortic and coronary atherosclerotic calcification incidentally noted. ABDOMEN/PELVIS Prostate: No focal activity within the prostate. Lymph nodes: No abnormal radiotracer accumulation within pelvic or abdominal nodes. Liver: No evidence of liver metastasis Incidental CT finding: Colonic diverticulosis, without evidence of diverticulitis. Small left inguinal hernia, which contains only fat. Aortic atherosclerotic calcification incidentally noted. SKELETON No focal  activity to suggest skeletal metastasis. IMPRESSION: Small bilateral pulmonary metastases show intense radiopharmaceutical uptake, which show slight decrease in size since prior outside PET-CT. No new or progressive metastatic disease. Electronically Signed   By: Marlaine Hind M.D.   On: 10/24/2021 13:43       Orders Placed This Encounter  Procedures   CBC with Differential    Standing Status:   Standing    Number of Occurrences:   20    Standing Expiration Date:   11/18/2022   Comprehensive metabolic panel    Standing Status:   Standing    Number of Occurrences:   20    Standing Expiration Date:   11/18/2022   PHYSICIAN COMMUNICATION ORDER    Pathway POS96 x 6 cycles with ADT.      cc Lajean Manes, MD

## 2021-11-17 ENCOUNTER — Encounter: Payer: Self-pay | Admitting: Oncology

## 2021-11-17 MED ORDER — DEXAMETHASONE 4 MG PO TABS: 8.0000 mg | ORAL_TABLET | Freq: Two times a day (BID) | ORAL | 1 refills | Status: DC

## 2021-11-17 MED ORDER — PROCHLORPERAZINE MALEATE 10 MG PO TABS: 10.0000 mg | ORAL_TABLET | Freq: Four times a day (QID) | ORAL | 1 refills | Status: DC | PRN

## 2021-11-17 MED ORDER — ONDANSETRON HCL 8 MG PO TABS: 8.0000 mg | ORAL_TABLET | Freq: Two times a day (BID) | ORAL | 1 refills | Status: DC | PRN

## 2021-11-17 NOTE — Progress Notes (Signed)
START ON PATHWAY REGIMEN - Prostate     Darolutamide: A cycle is every 28 days:     Darolutamide    Docetaxel cycles 1 through 6: A cycle is every 21 days:     Docetaxel   **Always confirm dose/schedule in your pharmacy ordering system**  Patient Characteristics: Adenocarcinoma, Recurrent/New Systemic Disease (Including Biochemical Recurrence), Non-Castrate, M1 Histology: Adenocarcinoma Therapeutic Status: Recurrent/New Systemic Disease (Including Biochemical Recurrence) Intent of Therapy: Non-Curative / Palliative Intent, Discussed with Patient

## 2021-11-17 NOTE — Assessment & Plan Note (Signed)
Goals of care was reviewed and discussed with patient.

## 2021-11-18 ENCOUNTER — Other Ambulatory Visit: Payer: Self-pay

## 2021-11-18 ENCOUNTER — Inpatient Hospital Stay: Payer: Medicare Other

## 2021-11-18 ENCOUNTER — Telehealth: Payer: Self-pay

## 2021-11-18 MED ORDER — MONTELUKAST SODIUM 10 MG PO TABS: 10.0000 mg | ORAL_TABLET | ORAL | 0 refills | Status: DC

## 2021-11-18 NOTE — Telephone Encounter (Signed)
-----   Message from Earlie Server, MD sent at 11/17/2021 12:21 AM EDT ----- Premeds-sent to pharmacy.  Please advise patient to take dexamethasone and Singulair today prior to chemotherapy and for 2 days after chemotherapy.

## 2021-11-18 NOTE — Telephone Encounter (Signed)
Pt informed of MD recommendation. Pt verbalized understanding.

## 2021-11-22 ENCOUNTER — Telehealth: Payer: Self-pay | Admitting: *Deleted

## 2021-11-22 DIAGNOSIS — R350 Frequency of micturition: Secondary | ICD-10-CM | POA: Diagnosis not present

## 2021-11-22 NOTE — Telephone Encounter (Signed)
Returned pt's call. Pt is requesting to move new tx appt on 6/28 out 2 more weeks to 7/12. He states that his partner is currently on radiation tx and once she is done he will be the one caring for her and if he does the tx he is afraid that he will not be able to help her. Pt was informed that it is important to start tx asap, but he states that he is aware and knows the consequences and is still requesting to r/s to 7/12. He also states that the regimen that he is currently on is showing results.

## 2021-11-22 NOTE — Telephone Encounter (Signed)
per pt request please r/s appts and inform him of new appt details  Lab/MD/ Docetaxel NEW on 7/12 Day 3 neulasta inj

## 2021-11-22 NOTE — Telephone Encounter (Signed)
Called pt back. Pt states that he was told to increase fluids and has had frequency with urination. He is going to call urology to inquire about getting a cathter for the period of time that he needs to increase his fluids. He wanted to know if it was ok for him to get a catheter from oncology aspect.

## 2021-11-22 NOTE — Telephone Encounter (Signed)
Pt left message requesting to speak with Dr. Collie Siad nurse regarding upcoming appts and treatment.

## 2021-11-26 ENCOUNTER — Telehealth: Payer: Self-pay

## 2021-11-26 NOTE — Telephone Encounter (Signed)
Return call made to patient about questions.  Patient question how many treatments are currently scheduled and I told him 6 cycles as of now.    Patient also asked about Prostate support groups and I sent him an e-mail with all of our resources in addition to the specific support group for Prostate at [facility].

## 2021-12-04 ENCOUNTER — Ambulatory Visit: Payer: Medicare Other

## 2021-12-04 ENCOUNTER — Ambulatory Visit: Payer: Medicare Other | Admitting: Oncology

## 2021-12-04 ENCOUNTER — Other Ambulatory Visit: Payer: Medicare Other

## 2021-12-06 ENCOUNTER — Inpatient Hospital Stay: Payer: Medicare Other | Attending: Oncology | Admitting: Oncology

## 2021-12-06 ENCOUNTER — Ambulatory Visit: Payer: Medicare Other

## 2021-12-06 ENCOUNTER — Other Ambulatory Visit: Payer: Self-pay

## 2021-12-06 VITALS — BP 132/74 | HR 65 | Temp 98.6°F | Resp 16 | Ht 73.0 in | Wt 219.2 lb

## 2021-12-06 DIAGNOSIS — C61 Malignant neoplasm of prostate: Secondary | ICD-10-CM | POA: Diagnosis present

## 2021-12-06 DIAGNOSIS — C7802 Secondary malignant neoplasm of left lung: Secondary | ICD-10-CM | POA: Insufficient documentation

## 2021-12-06 DIAGNOSIS — Z79899 Other long term (current) drug therapy: Secondary | ICD-10-CM | POA: Diagnosis not present

## 2021-12-06 DIAGNOSIS — C7801 Secondary malignant neoplasm of right lung: Secondary | ICD-10-CM | POA: Diagnosis not present

## 2021-12-06 NOTE — Progress Notes (Signed)
Hematology and Oncology Follow Up Visit  Brian Bean [id number] 1940/07/14 81 y.o. 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  He was initially diagnosed in 2015 with a PSA 5.9 and Gleason score of 6.   Prior Therapy:  He was on active surveillance between 2015 and 2019.  He subsequently received definitive treatment and 2019 receiving 5.5 weeks of IMRT to the prostate without androgen deprivation.  His PSA nadir was 0.6.    In November 2021 his PSA was up to 2.5 and in May 2022 was 4.3.  He underwent PSMA PET scan in July 2022 which was performed at [facility] and showed 2 hypermetabolic pulmonary nodules with 1.4 cm right upper lobe nodule and a 1.5 cm left lower lobe nodule.    Current therapy:   Eligard 30 mg every 4 months started on May 10, 2021.  His last injection was given on September 12, 2021.  He is under consideration t for additional treatment.   Interim History: Brian Bean presents today for a follow-up visit.  Since the last visit, he establish care with Dr. Tasia Catchings at [facility] and scheduled to start systemic chemotherapy in the near future.  He denies any complications at this time.  He denies nausea, vomiting or shortness of breath.  He denies any hospitalizations or illnesses.     Medications: Updated on review. Current Outpatient Medications  Medication Sig Dispense Refill   amLODipine (NORVASC) 5 MG tablet Take 5 mg by mouth 2 (two) times daily.      aspirin EC 81 MG tablet Take 81 mg by mouth daily. Swallow whole.     atorvastatin (LIPITOR) 20 MG tablet Take 20 mg by mouth daily.     carvedilol (COREG) 12.5 MG tablet Take 12.5 mg by mouth 2 (two) times daily with a meal.      dexamethasone (DECADRON) 4 MG tablet Take 2 tablets (8 mg total) by mouth 2 (two) times daily. Start the day before Taxotere. Then daily after chemo for 2 days.  30 tablet 1   fluorouracil (EFUDEX) 5 % cream Apply 1 application topically 2 (two) times daily as needed (precancerous spots).     FLUoxetine (PROZAC) 40 MG capsule Take 80 mg by mouth daily.     fluticasone (FLONASE ALLERGY RELIEF) 50 MCG/ACT nasal spray Place 1 spray into both nostrils daily as needed for allergies or rhinitis.     hydrochlorothiazide (HYDRODIURIL) 25 MG tablet Take 25 mg by mouth daily.      IPRATROPIUM BROMIDE IN Inhale into the lungs as needed.     ketoconazole (NIZORAL) 2 % shampoo Apply 1 application topically daily as needed for irritation.     lisinopril (PRINIVIL,ZESTRIL) 10 MG tablet Take 10 mg by mouth daily.     Melatonin 10 MG TABS Take 10 mg by mouth at bedtime.     montelukast (SINGULAIR) 10 MG tablet Take 1 tablet (10 mg total) by mouth See admin instructions. Take 1 tablet daily. Start the day before Taxotere. Then daily after chemo for 2 days 20 tablet 0   naproxen sodium (ALEVE) 220 MG tablet Take 220 mg by mouth daily as needed (pain).     ondansetron (ZOFRAN) 8 MG tablet Take 1 tablet (8 mg total) by mouth 2 (two) times daily as needed for refractory nausea / vomiting. 30 tablet 1   prochlorperazine (COMPAZINE) 10 MG tablet Take 1 tablet (10 mg  total) by mouth every 6 (six) hours as needed (Nausea or vomiting). 30 tablet 1   tadalafil (CIALIS) 20 MG tablet Take 20 mg by mouth daily as needed for erectile dysfunction.     tamsulosin (FLOMAX) 0.4 MG CAPS capsule Take 0.4 mg by mouth daily.     No current facility-administered medications for this visit.     Allergies: No Known Allergies    Physical Exam:  Blood pressure 132/74, pulse 65, temperature 98.6 F (37 C), temperature source Temporal, resp. rate 16, height 6\' 1"  (1.854 m), weight 219 lb 3.2 oz (99.4 kg), SpO2 100 %.  ECOG: 1   General appearance: Alert, awake without any distress. Head: Atraumatic without abnormalities Oropharynx: Without any thrush or ulcers. Eyes: No scleral  icterus. Lymph nodes: No lymphadenopathy noted in the cervical, supraclavicular, or axillary nodes Heart:regular rate and rhythm, without any murmurs or gallops.   Lung: Clear to auscultation without any rhonchi, wheezes or dullness to percussion. Abdomin: Soft, nontender without any shifting dullness or ascites. Musculoskeletal: No clubbing or cyanosis. Neurological: No motor or sensory deficits. Skin: No rashes or lesions.     Lab Results: Lab Results  Component Value Date   WBC 12.9 (H) 10/08/2021   HGB 13.6 10/08/2021   HCT 40.7 10/08/2021   MCV 89.6 10/08/2021   PLT 289 10/08/2021     Chemistry      Component Value Date/Time   NA 135 10/08/2021 1157   NA 136 06/24/2016 1014   K 3.7 10/08/2021 1157   K 3.9 06/24/2016 1014   CL 104 10/08/2021 1157   CL 105 10/06/2012 1047   CO2 25 10/08/2021 1157   CO2 24 06/24/2016 1014   BUN 21 10/08/2021 1157   BUN 17.8 06/24/2016 1014   CREATININE 0.76 10/08/2021 1157   CREATININE 0.93 05/09/2021 0936   CREATININE 0.9 06/24/2016 1014      Component Value Date/Time   CALCIUM 8.7 (L) 10/08/2021 1157   CALCIUM 9.2 06/24/2016 1014   ALKPHOS 55 10/08/2021 1157   ALKPHOS 55 06/24/2016 1014   AST 17 10/08/2021 1157   AST 20 05/09/2021 0936   AST 16 06/24/2016 1014   ALT 17 10/08/2021 1157   ALT 26 05/09/2021 0936   ALT 17 06/24/2016 1014   BILITOT 1.0 10/08/2021 1157   BILITOT 0.7 05/09/2021 0936   BILITOT 0.51 06/24/2016 1014       IMPRESSION: 1. The scattered pulmonary nodules are mildly reduced in size compared to 04/16/2021. No new nodules identified. 2. Scattered small to borderline enlarged mediastinal lymph nodes are slightly increased in size from 04/16/2021, although right subpectoral lymph nodes are reduced in size. 3.  Aortic Atherosclerosis (ICD10-I70.0).  Coronary atherosclerosis.   IMPRESSION: Small bilateral pulmonary metastases show intense radiopharmaceutical uptake, which show slight decrease in  size since prior outside PET-CT.   No new or progressive metastatic disease.   Impression and Plan:  81 year old man with:  1.  Advanced prostate cancer with pulmonary nodules noted in 2022.  He has castration-sensitive disease at this time.  The natural course of this disease was reviewed again in detail including reviewing his imaging studies CT scan of the chest as well as with PSMA PET scan.  The response to androgen deprivation therapy correlating with a decline in his PSA likely indicates that the primary area of metastasis include pulmonary nodules.  Therapy escalation options including oral targeted therapy with androgen receptor pathway inhibitors or systemic chemotherapy is the standard of care at  this time.  Complications associated with these treatments were discussed today in detail including the improvement and progression free survival and overall survival based on multitude of studies.  After discussion today, he opted against any therapy escalation at this time.  He is preferring quality of life at this time which is his #1 consideration.  For the time being, I have recommended continuing androgen deprivation therapy alone and updating his staging scan in the next 6 months.  He has less than adequate response that would be in favor of at least adding androgen receptor pathway inhibitor in addition to androgen deprivation.  His preference is to continue to receive his care in [place].  I urged him to inform Dr. Tasia Catchings of his decision and his preference.   2.  Bilateral pulmonary nodules: Likely related to prostate cancer given their response to therapy.  We will continue to monitor and update staging scans in the next 6 months.   3.  Androgen deprivation therapy: He will receive Eligard again in August 2023.    4.  Follow up: He will return in 4 months for a follow-up visit.   30  minutes were dedicated to this encounter.  The time was spent on reviewing laboratory data,  disease status update and outlining future plan of care discussion.           Zola Button, MD 6/30/20234:00 PM

## 2021-12-11 ENCOUNTER — Telehealth: Payer: Self-pay | Admitting: Oncology

## 2021-12-11 NOTE — Telephone Encounter (Signed)
Pt left VM to let us know he will keep appts.

## 2021-12-11 NOTE — Telephone Encounter (Signed)
Called to inform patient that Dr. Tasia Catchings is ok with delaying tx. Patient did not answer. Left message to call back if he is wanting to keep his appt for 712 to discuss further options. Advised to call back.

## 2021-12-11 NOTE — Telephone Encounter (Signed)
Pt called and stated he wanted to speak to a nurse about waiting 6 months to start his tx. He would like a call back.

## 2021-12-17 MED FILL — Dexamethasone Sodium Phosphate Inj 100 MG/10ML: INTRAMUSCULAR | Qty: 1 | Status: AC

## 2021-12-18 ENCOUNTER — Encounter: Payer: Self-pay | Admitting: Oncology

## 2021-12-18 ENCOUNTER — Telehealth: Payer: Self-pay | Admitting: *Deleted

## 2021-12-18 ENCOUNTER — Inpatient Hospital Stay (HOSPITAL_BASED_OUTPATIENT_CLINIC_OR_DEPARTMENT_OTHER): Payer: Medicare Other | Admitting: Oncology

## 2021-12-18 ENCOUNTER — Inpatient Hospital Stay: Payer: Medicare Other | Attending: Oncology

## 2021-12-18 ENCOUNTER — Inpatient Hospital Stay: Payer: Medicare Other

## 2021-12-18 DIAGNOSIS — Z79899 Other long term (current) drug therapy: Secondary | ICD-10-CM | POA: Diagnosis not present

## 2021-12-18 DIAGNOSIS — C61 Malignant neoplasm of prostate: Secondary | ICD-10-CM

## 2021-12-18 DIAGNOSIS — C9110 Chronic lymphocytic leukemia of B-cell type not having achieved remission: Secondary | ICD-10-CM | POA: Insufficient documentation

## 2021-12-18 DIAGNOSIS — C7801 Secondary malignant neoplasm of right lung: Secondary | ICD-10-CM | POA: Diagnosis not present

## 2021-12-18 DIAGNOSIS — C7802 Secondary malignant neoplasm of left lung: Secondary | ICD-10-CM | POA: Diagnosis not present

## 2021-12-18 LAB — COMPREHENSIVE METABOLIC PANEL
ALT: 19 U/L (ref 0–44)
AST: 19 U/L (ref 15–41)
Albumin: 3.9 g/dL (ref 3.5–5.0)
Alkaline Phosphatase: 51 U/L (ref 38–126)
Anion gap: 6 (ref 5–15)
BUN: 24 mg/dL — ABNORMAL HIGH (ref 8–23)
CO2: 28 mmol/L (ref 22–32)
Calcium: 8.8 mg/dL — ABNORMAL LOW (ref 8.9–10.3)
Chloride: 105 mmol/L (ref 98–111)
Creatinine, Ser: 0.8 mg/dL (ref 0.61–1.24)
GFR, Estimated: >60 mL/min (ref >60)
Glucose, Bld: 116 mg/dL — ABNORMAL HIGH (ref 70–99)
Potassium: 3.9 mmol/L (ref 3.5–5.1)
Sodium: 139 mmol/L (ref 135–145)
Total Bilirubin: 0.4 mg/dL (ref 0.3–1.2)
Total Protein: 6.4 g/dL — ABNORMAL LOW (ref 6.5–8.1)

## 2021-12-18 LAB — CBC WITH DIFFERENTIAL/PLATELET
Abs Immature Granulocytes: 0.03 10*3/uL (ref 0.00–0.07)
Basophils Absolute: 0.1 10*3/uL (ref 0.0–0.1)
Basophils Relative: 1 %
Eosinophils Absolute: 0.2 10*3/uL (ref 0.0–0.5)
Eosinophils Relative: 2 %
HCT: 41.5 % (ref 39.0–52.0)
Hemoglobin: 13.5 g/dL (ref 13.0–17.0)
Immature Granulocytes: 0 %
Lymphocytes Relative: 52 %
Lymphs Abs: 5.8 10*3/uL — ABNORMAL HIGH (ref 0.7–4.0)
MCH: 29.6 pg (ref 26.0–34.0)
MCHC: 32.5 g/dL (ref 30.0–36.0)
MCV: 91 fL (ref 80.0–100.0)
Monocytes Absolute: 0.5 10*3/uL (ref 0.1–1.0)
Monocytes Relative: 5 %
Neutro Abs: 4.4 10*3/uL (ref 1.7–7.7)
Neutrophils Relative %: 40 %
Platelets: 171 10*3/uL (ref 150–400)
RBC: 4.56 MIL/uL (ref 4.22–5.81)
RDW: 14.6 % (ref 11.5–15.5)
Smear Review: NORMAL
WBC: 11.1 10*3/uL — ABNORMAL HIGH (ref 4.0–10.5)
nRBC: 0 % (ref 0.0–0.2)

## 2021-12-18 NOTE — Telephone Encounter (Signed)
Received PC from patient stating he is transferring his care to [place] with Dr Tasia Catchings & he would like to cancel all appointments at [facility].  Dr. Alen Blew aware.

## 2021-12-18 NOTE — Progress Notes (Signed)
Hematology/Oncology Progress note [phone] [phone]            Patient Care Team: Lajean Manes, MD as PCP - General (Internal Medicine) Frederik Pear, MD as Attending Physician (Orthopedic Surgery) Katheren Puller, RN as Oncology Nurse Navigator  REFERRING PROVIDER: Lajean Manes, MD  CHIEF COMPLAINTS/REASON FOR VISIT:  Prostate cancer  ASSESSMENT & PLAN:   Prostate cancer ([place]) Stage IV castration sensitive prostate cancer with multiple radiotracer avid lung nodules on PSMA. Previous lung nodule biopsy results are consistent with malignant cells, non-small cell carcinoma, not enough tissue for further work-up for tissue origin. Nodules are avid on PSMA, likely metastatic prostate cancer lung involvement.  PMSA PET scan showed interval decrease of pulmonary nodules.  Consistent with treatment response. Continue Adrogen deprivation therapy.-Lupron 30 mg every 4 months, next due in August 2023 We previously discussed about upfront docetaxel every 3 weeks for 6 cycles.  Patient initially agreed and today he would like to defer chemotherapy for now.  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  We discussed about oral agents options.  Patient appreciate explanation.  He prefers to stay on androgen deprivation therapy for now and keep chemotherapy options open for the future. He will return in August for Lupron injection  CLL (chronic lymphocytic leukemia) ([place]) Diagnosed on 07/08/2012.  Currently on watchful waiting  Patient was seen by Dr.Shadad and Dr.Gorsuch for prostate cancer and CLL. He tells me that he would like to continue his oncology care locally at [facility] with me and I encourage him to notify [facility] office about his preference.,   Orders Placed This Encounter  Procedures   PSA    Standing Status:   Future    Standing Expiration Date:   12/19/2022   CBC with Differential/Platelet    Standing Status:    Future    Standing Expiration Date:   12/19/2022   Comprehensive metabolic panel    Standing Status:   Future    Standing Expiration Date:   12/18/2022   PSA    Standing Status:   Future    Standing Expiration Date:   12/19/2022     All questions were answered. The patient knows to call the clinic with any problems questions or concerns. Follow up   Follow up   8/7 Lab PSA only -leupron 16 weeks flex after 8/7 lab cbc cmp PSA prior to MD + leupron   Earlie Server, MD, PhD [facility] Hematology Oncology 12/18/2021    HISTORY OF PRESENTING ILLNESS:   Brian Bean is a  81 y.o.  male with PMH listed below was seen in consultation at the request of  Lajean Manes, MD  for evaluation of prostate cancer.  Patient's oncology care was previously with Dr. Zola Button.  Patient presents for second opinion and prefers to transfer care to our cancer center. I reviewed patient's previous oncology records.  Oncology History  CLL (chronic lymphocytic leukemia) ([place])  07/08/2012 Pathology Results   Accession #: [id number]  peripheral blood comfirmed CLL   2015 Initial Diagnosis   #History of prostate cancer, initially diagnosed in June 2015, Gleason score of 6, patient has been on active surveillance between 2015-2019, PSA gradually increasing up to 16.9.   06/24/2016 Pathology Results   CLL FISH showed 3.67% positive for deletion 13 q and 5% positive for p53 deletion   01/10/2021 Cancer Staging   Staging form: Chronic Lymphocytic Leukemia / Small Lymphocytic Lymphoma, AJCC 8th Edition -  Clinical stage from 01/10/2021: Modified Rai Stage 0 (Modified Rai risk: Low, Lymphocytosis: Present, Adenopathy: Absent, Organomegaly: Absent, Anemia: Absent, Thrombocytopenia: Absent) - Signed by Heath Lark, MD on 01/10/2021 Stage prefix: Initial diagnosis   Prostate cancer [facility])  2015 Initial Diagnosis   #History of prostate cancer, initially diagnosed in June 2015, Gleason score of 6, patient has been on  active surveillance between 2015-2019, PSA gradually increasing up to 16.9.   06/24/2016 Initial Diagnosis   Prostate cancer ([place])   12/28/2020 Imaging   12/28/2020, PSMA at [facility] showed multiple bilateral radiotracer avid pulmonary nodules.  For example right upper lobe 1.2 x 1.4 cm and the left lower lobe 1.5 x 1.3 cm.  Prostate showed no focal radiotracer uptake to suggest local recurrence.  No PSMA avid pelvic, retroperitoneal, or mesenteric adenopathy.  Multiple pulmonary radiotracer avid nodules concerning for metastatic disease.  No osseous metastatic disease.  Severe three-vessel coronary artery calcifications   04/16/2021 Initial Biopsy   patient underwent biopsy via bronchoscopy. Lung R LL biopsy showed malignant cells consistent with non-small cell carcinoma. Lung RLL brushing showed malignant cells consistent with non-small cell carcinoma, Lung R UL brushing showed no malignant cells. Lung R UL biopsy showed malignant cells consistent with non-small cell carcinoma. Lung RUL lavage showed no malignant cells identified. Immunostains for TTF-1, Napsin A, p63 and CK5/6 were attempted but are noncontributory.  There is insufficient tumor tissue on the cellblock for additional studies.     05/2021 Tumor Marker   PSA 8.7   05/10/2021 Miscellaneous   05/10/2021, started on androgen deprivation therapy. Eligard 30 mg every 4 months   08/2021 Tumor Marker   PSA 0.2   10/08/2021 Cancer Staging   Staging form: Prostate, AJCC 8th Edition - Clinical: Stage IVB (ycTX, cNX, cM1) - Signed by Earlie Server, MD on 10/08/2021 Stage prefix: Post-therapy   10/23/2021 Imaging   PSMA PET scan showed Small bilateral pulmonary metastases show intense radiopharmaceutical uptake, which show slight decrease in size since prior outside PET-CT. No new or progressive metastatic disease.   11/08/2021 Imaging   CT chest wo contrast showed the scattered pulmonary nodules are mildly reduced in size compared to  04/16/2021. No new nodules identified.    INTERVAL HISTORY Brian Bean is a 81 y.o. male who has above history reviewed by me today presents for follow up visit for prostate cancer. Patient reports feeling well.  Patient has chronic fatigue due to being on edge and radiation therapy.Patient has no new complaints today.  Review of Systems  Constitutional:  Positive for fatigue. Negative for appetite change, chills, fever and unexpected weight change.  HENT:   Negative for hearing loss and voice change.   Eyes:  Negative for eye problems and icterus.  Respiratory:  Negative for chest tightness, cough and shortness of breath.   Cardiovascular:  Negative for chest pain and leg swelling.  Gastrointestinal:  Negative for abdominal distention and abdominal pain.  Endocrine: Positive for hot flashes.  Genitourinary:  Negative for difficulty urinating, dysuria and frequency.   Musculoskeletal:  Negative for arthralgias.  Skin:  Negative for itching and rash.  Neurological:  Negative for light-headedness and numbness.  Hematological:  Negative for adenopathy. Does not bruise/bleed easily.  Psychiatric/Behavioral:  Negative for confusion.     MEDICAL HISTORY:  Past Medical History:  Diagnosis Date   Anxiety    Arthritis    Cancer ([place])    skin lesion scalp   CLL (chronic lymphocytic leukemia) ([place]) 01/06/2013   CLL (  chronic lymphocytic leukemia) ([place])    Depression    Head injury    fell from a horse   Hypertension    Lymphocytosis    PONV (postoperative nausea and vomiting)    extremely sick did not get sick after hip surgery in 2013   Prostate cancer [facility])     SURGICAL HISTORY: Past Surgical History:  Procedure Laterality Date   APPENDECTOMY     BRONCHIAL BIOPSY  04/16/2021   Procedure: BRONCHIAL BIOPSIES;  Surgeon: Garner Nash, DO;  Location: [place] [facility];  Service: Pulmonary;;   BRONCHIAL BRUSHINGS  04/16/2021   Procedure: BRONCHIAL BRUSHINGS;  Surgeon: Garner Nash, DO;  Location: [place] [facility];  Service: Pulmonary;;   BRONCHIAL NEEDLE ASPIRATION BIOPSY  04/16/2021   Procedure: BRONCHIAL NEEDLE ASPIRATION BIOPSIES;  Surgeon: Garner Nash, DO;  Location: [place] [facility];  Service: Pulmonary;;   BRONCHIAL WASHINGS  04/16/2021   Procedure: BRONCHIAL WASHINGS;  Surgeon: Garner Nash, DO;  Location: [place] [facility];  Service: Pulmonary;;   CATARACT EXTRACTION, BILATERAL     CERVICAL DISC SURGERY  00   COLONOSCOPY  2011   [place]     rt ankle   HERNIA REPAIR     rt and  undistended testicle   PROSTATE BIOPSY     REVERSE SHOULDER ARTHROPLASTY Right 05/01/2020   Procedure: REVERSE SHOULDER ARTHROPLASTY;  Surgeon: Corky Mull, MD;  Location: [facility]S;  Service: Orthopedics;  Laterality: Right;   rt hip  12   pinned   TOTAL HIP ARTHROPLASTY  05/03/2012   Procedure: TOTAL HIP ARTHROPLASTY;  Surgeon: Kerin Salen, MD;  Location: [place];  Service: Orthopedics;  Laterality: Right;   VIDEO BRONCHOSCOPY WITH ENDOBRONCHIAL NAVIGATION Right 04/16/2021   Procedure: VIDEO BRONCHOSCOPY WITH ENDOBRONCHIAL NAVIGATION;  Surgeon: Garner Nash, DO;  Location: [place];  Service: Pulmonary;  Laterality: Right;  ION w/ CIOS   VIDEO BRONCHOSCOPY WITH RADIAL ENDOBRONCHIAL ULTRASOUND  04/16/2021   Procedure: RADIAL ENDOBRONCHIAL ULTRASOUND;  Surgeon: Garner Nash, DO;  Location: [facility];  Service: Pulmonary;;    SOCIAL HISTORY: Social History   Socioeconomic History   Marital status: Divorced    Spouse name: Not on file   Number of children: 1   Years of education: Not on file   Highest education level: Not on file  Occupational History    Comment: retired Copywriter, advertising ([employer]  Tobacco Use   Smoking status: Former    Packs/day: 0.50    Years: 10.00    Total pack years: 5.00    Types: Cigarettes    Quit date: 04/28/1977    Years since quitting: 44.6   Smokeless tobacco: Never   Tobacco comments:    10 oz  alcohol wkly  Vaping Use   Vaping Use: Never used  Substance and Sexual Activity   Alcohol use: Yes    Alcohol/week: 21.0 standard drinks of alcohol    Types: 5 Glasses of wine, 16 Shots of liquor per week    Comment: 14 ounces a week   Drug use: No   Sexual activity: Yes  Other Topics Concern   Not on file  Social History Narrative   Retired.   Significant other: Nicanor Bake   One son, Saralyn Pilar   Social Determinants of Health   Financial Resource Strain: Not on [employer] Insecurity: Not on file  Transportation Needs: Not on file  Physical Activity: Not on file  Stress: Not on file  Social Connections: Not on file  Intimate Partner Violence: Not on file    FAMILY HISTORY: Family History  Problem Relation Age of Onset   Heart disease Mother    Cancer Father        unk type of cancer    ALLERGIES:  has No Known Allergies.  MEDICATIONS:  Current Outpatient Medications  Medication Sig Dispense Refill   amLODipine (NORVASC) 5 MG tablet Take 5 mg by mouth 2 (two) times daily.      aspirin EC 81 MG tablet Take 81 mg by mouth daily. Swallow whole.     atorvastatin (LIPITOR) 20 MG tablet Take 20 mg by mouth daily.     carvedilol (COREG) 12.5 MG tablet Take 12.5 mg by mouth 2 (two) times daily with a meal.      fluorouracil (EFUDEX) 5 % cream Apply 1 application topically 2 (two) times daily as needed (precancerous spots).     FLUoxetine (PROZAC) 40 MG capsule Take 80 mg by mouth daily.     fluticasone (FLONASE ALLERGY RELIEF) 50 MCG/ACT nasal spray Place 1 spray into both nostrils daily as needed for allergies or rhinitis.     hydrochlorothiazide (HYDRODIURIL) 25 MG tablet Take 25 mg by mouth daily.      IPRATROPIUM BROMIDE IN Inhale into the lungs as needed.     ketoconazole (NIZORAL) 2 % shampoo Apply 1 application topically daily as needed for irritation.     lisinopril (PRINIVIL,ZESTRIL) 10 MG tablet Take 10 mg by mouth daily.     Melatonin 10 MG TABS Take 10 mg by  mouth at bedtime.     montelukast (SINGULAIR) 10 MG tablet Take 1 tablet (10 mg total) by mouth See admin instructions. Take 1 tablet daily. Start the day before Taxotere. Then daily after chemo for 2 days 20 tablet 0   naproxen sodium (ALEVE) 220 MG tablet Take 220 mg by mouth daily as needed (pain).     tadalafil (CIALIS) 20 MG tablet Take 20 mg by mouth daily as needed for erectile dysfunction.     tamsulosin (FLOMAX) 0.4 MG CAPS capsule Take 0.4 mg by mouth daily.     dexamethasone (DECADRON) 4 MG tablet Take 2 tablets (8 mg total) by mouth 2 (two) times daily. Start the day before Taxotere. Then daily after chemo for 2 days. (Patient not taking: Reported on 12/18/2021) 30 tablet 1   ondansetron (ZOFRAN) 8 MG tablet Take 1 tablet (8 mg total) by mouth 2 (two) times daily as needed for refractory nausea / vomiting. (Patient not taking: Reported on 12/18/2021) 30 tablet 1   prochlorperazine (COMPAZINE) 10 MG tablet Take 1 tablet (10 mg total) by mouth every 6 (six) hours as needed (Nausea or vomiting). (Patient not taking: Reported on 12/18/2021) 30 tablet 1   No current facility-administered medications for this visit.     PHYSICAL EXAMINATION: ECOG PERFORMANCE STATUS: 1 - Symptomatic but completely ambulatory Vitals:   12/18/21 0838  BP: 128/75  Pulse: 63  Resp: 18  Temp: (!) 97.1 F (36.2 C)  SpO2: 100%   Filed Weights   12/18/21 0838  Weight: 220 lb 14.4 oz (100.2 kg)    Physical Exam Constitutional:      General: He is not in acute distress. HENT:     Head: Normocephalic and atraumatic.  Eyes:     General: No scleral icterus. Cardiovascular:     Rate and Rhythm: Normal rate and regular rhythm.     Heart sounds: Normal heart  sounds.  Pulmonary:     Effort: Pulmonary effort is normal. No respiratory distress.     Breath sounds: No wheezing.  Abdominal:     General: Bowel sounds are normal. There is no distension.     Palpations: Abdomen is soft.  Musculoskeletal:         General: No deformity. Normal range of motion.     Cervical back: Normal range of motion and neck supple.  Skin:    General: Skin is warm and dry.     Findings: No erythema or rash.  Neurological:     Mental Status: He is alert and oriented to person, place, and time. Mental status is at baseline.     Cranial Nerves: No cranial nerve deficit.     Coordination: Coordination normal.  Psychiatric:        Mood and Affect: Mood normal.     LABORATORY DATA:  I have reviewed the data as listed Lab Results  Component Value Date   WBC 11.1 (H) 12/18/2021   HGB 13.5 12/18/2021   HCT 41.5 12/18/2021   MCV 91.0 12/18/2021   PLT 171 12/18/2021   Recent Labs    05/09/21 0936 10/08/21 1157 12/18/21 0822  NA 140 135 139  K 3.6 3.7 3.9  CL 104 104 105  CO2 26 25 28   GLUCOSE 113* 121* 116*  BUN 16 21 24*  CREATININE 0.93 0.76 0.80  CALCIUM 8.9 8.7* 8.8*  GFRNONAA >60 >60 >60  PROT 6.7 7.0 6.4*  ALBUMIN 3.9 3.5 3.9  AST 20 17 19   ALT 26 17 19   ALKPHOS 54 55 51  BILITOT 0.7 1.0 0.4    Iron/TIBC/Ferritin/ %Sat No results found for: "IRON", "TIBC", "FERRITIN", "IRONPCTSAT"    RADIOGRAPHIC STUDIES: I have personally reviewed the radiological images as listed and agreed with the findings in the report. CT Chest Wo Contrast  Result Date: 11/08/2021 CLINICAL DATA:  Prostate cancer, radiotracer avid pulmonary nodules on PSMA PET. * Tracking Code: BO * EXAM: CT CHEST WITHOUT CONTRAST TECHNIQUE: Multidetector CT imaging of the chest was performed following the standard protocol without IV contrast. RADIATION DOSE REDUCTION: This exam was performed according to the departmental dose-optimization program which includes automated exposure control, adjustment of the mA and/or kV according to patient size and/or use of iterative reconstruction technique. COMPARISON:  10/23/2021 FINDINGS: Cardiovascular: Coronary, aortic arch, and branch vessel atherosclerotic vascular disease. Mediastinum/Nodes:  Numerous scattered mostly small mediastinal lymph nodes are present., although these have increased in size compared to 04/16/2021. For example, a left paratracheal node measuring 1.0 cm in diameter on image 50 of series 2 previously measured 0.6 cm in short axis diameter on 04/16/2021. A right paratracheal node measures 1.0 cm in short axis on image 30 series 2, formerly 0.7 cm. However, there is been reduction in size of the right subpectoral lymph nodes, with the more medial of the subpectoral lymph nodes measuring 1.0 cm in short axis on image 23 series 2, formerly 1.2 cm. Lungs/Pleura: Given that the recent PSMA PET is from less than 2 weeks ago, lung nodule sizes are compared to the prior diagnostic CT chest from 04/16/2021. The scattered lung nodules are mostly reduced in size compared to 04/16/2021. For example, index nodule in the superior segment right lower lobe measures 1.5 by 1.1 cm on image 48 series 5, formerly 1.9 by 1.9 cm. Index left lower lobe nodule 1.3 by 1.1 cm on image 94 series 5, formerly 1.8 by 1.5 cm. The other  scattered pulmonary nodules are likewise mildly reduced in size compared to 04/16/2021. I do not see any new nodules. Upper Abdomen: Nonspecific 8 mm hypodensity near the gallbladder fossa of the liver on image 147 series 2. Atherosclerosis is present, including aortoiliac atherosclerotic disease. Scattered diverticula of the transverse colon. Musculoskeletal: Right shoulder arthroplasty. Old left rib deformities and old left inferior scapular deformity. Thoracic kyphosis and spondylosis. No compelling findings of osseous metastatic disease in the chest. IMPRESSION: 1. The scattered pulmonary nodules are mildly reduced in size compared to 04/16/2021. No new nodules identified. 2. Scattered small to borderline enlarged mediastinal lymph nodes are slightly increased in size from 04/16/2021, although right subpectoral lymph nodes are reduced in size. 3.  Aortic Atherosclerosis  (ICD10-I70.0).  Coronary atherosclerosis. Electronically Signed   By: Van Clines M.D.   On: 11/08/2021 10:58   NM PET (PSMA) SKULL TO MID THIGH  Result Date: 10/24/2021 CLINICAL DATA:  Metastatic prostate carcinoma. EXAM: NUCLEAR MEDICINE PET SKULL BASE TO THIGH TECHNIQUE: 9.0 mCi F18 Piflufolastat (Pylarify) was injected intravenously. Full-ring PET imaging was performed from the skull base to thigh after the radiotracer. CT data was obtained and used for attenuation correction and anatomic localization. COMPARISON:  Outside PET-CT from [place] dated 12/28/2020 FINDINGS: NECK No radiotracer activity in neck lymph nodes. Incidental CT finding: None CHEST No radiotracer accumulation within mediastinal or hilar lymph nodes. Multiple small pulmonary nodules are again seen bilaterally which show radiopharmaceutical uptake. Several of these nodules show mild decrease in size since previous study. Index nodule in the posterior left lower lobe on image 119/2 measures 12 mm, compared to 15 mm previously. This has SUV max of 12.6 on today's exam. Another index nodule in the posterior right upper lobe abutting the fissure measures 11 mm on image 92/2, compared to 14 mm previously. This has SUV max of 12.4 on today's exam. No new or enlarging pulmonary nodules are seen on CT images. Incidental CT finding: Aortic and coronary atherosclerotic calcification incidentally noted. ABDOMEN/PELVIS Prostate: No focal activity within the prostate. Lymph nodes: No abnormal radiotracer accumulation within pelvic or abdominal nodes. Liver: No evidence of liver metastasis Incidental CT finding: Colonic diverticulosis, without evidence of diverticulitis. Small left inguinal hernia, which contains only fat. Aortic atherosclerotic calcification incidentally noted. SKELETON No focal  activity to suggest skeletal metastasis. IMPRESSION: Small bilateral pulmonary metastases show intense radiopharmaceutical uptake, which show slight  decrease in size since prior outside PET-CT. No new or progressive metastatic disease. Electronically Signed   By: Marlaine Hind M.D.   On: 10/24/2021 13:43      cc Lajean Manes, MD

## 2021-12-18 NOTE — Progress Notes (Signed)
Patient here for follow up. Pt reports that he has no plans of starting tx today.

## 2021-12-18 NOTE — Assessment & Plan Note (Addendum)
Stage IV castration sensitive prostate cancer with multiple radiotracer avid lung nodules on PSMA. Previous lung nodule biopsy results are consistent with malignant cells, non-small cell carcinoma, not enough tissue for further work-up for tissue origin. Nodules are avid on PSMA, likely metastatic prostate cancer lung involvement.  PMSA PET scan showed interval decrease of pulmonary nodules.  Consistent with treatment response. Continue Adrogen deprivation therapy.-Lupron 30 mg every 4 months, next due in August 2023 We previously discussed about upfront docetaxel every 3 weeks for 6 cycles.  Patient initially agreed and today he would like to defer chemotherapy for now.  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  We discussed about oral agents/androgen receptor pathway inhibitor options.  Patient appreciate explanation.  He prefers to stay on androgen deprivation therapy only for now and keep chemotherapy options open for the future. He will return in August for Lupron injection

## 2021-12-18 NOTE — Assessment & Plan Note (Addendum)
Diagnosed on 07/08/2012.  Currently on watchful waiting

## 2021-12-20 ENCOUNTER — Inpatient Hospital Stay: Payer: Medicare Other

## 2021-12-30 ENCOUNTER — Other Ambulatory Visit: Payer: Self-pay

## 2021-12-30 DIAGNOSIS — E7800 Pure hypercholesterolemia, unspecified: Secondary | ICD-10-CM | POA: Diagnosis not present

## 2021-12-30 DIAGNOSIS — I10 Essential (primary) hypertension: Secondary | ICD-10-CM | POA: Diagnosis not present

## 2022-01-13 ENCOUNTER — Ambulatory Visit: Payer: Medicare Other | Admitting: Hematology and Oncology

## 2022-01-13 ENCOUNTER — Other Ambulatory Visit: Payer: Medicare Other

## 2022-01-13 ENCOUNTER — Ambulatory Visit: Payer: Medicare Other

## 2022-01-18 ENCOUNTER — Encounter: Payer: Self-pay | Admitting: Oncology

## 2022-01-20 ENCOUNTER — Telehealth: Payer: Self-pay

## 2022-01-20 NOTE — Telephone Encounter (Signed)
Patient had Eligard last on 09/12/21. No further injections scheduled. Please advise on when next dose should be.

## 2022-01-20 NOTE — Telephone Encounter (Signed)
Patient sent mychart message regarding scheduling an injection. Sent him message back to inform him of the plan.   Please schedule pt for:   lab/ Eligard this week.  Cancel appts in November.  Schedule labs 4 months after current injections, then MD/ eligard 2-3 days after labs.  Please inform pt of appts.

## 2022-01-22 ENCOUNTER — Other Ambulatory Visit: Payer: Self-pay

## 2022-01-23 ENCOUNTER — Inpatient Hospital Stay: Payer: Medicare Other | Attending: Oncology

## 2022-01-23 ENCOUNTER — Inpatient Hospital Stay: Payer: Medicare Other

## 2022-01-23 DIAGNOSIS — C61 Malignant neoplasm of prostate: Secondary | ICD-10-CM | POA: Insufficient documentation

## 2022-01-23 DIAGNOSIS — Z79899 Other long term (current) drug therapy: Secondary | ICD-10-CM | POA: Diagnosis not present

## 2022-01-23 LAB — PSA: Prostatic Specific Antigen: 0.02 ng/mL (ref 0.00–4.00)

## 2022-01-23 MED ORDER — LEUPROLIDE ACETATE (4 MONTH) 30 MG ~~LOC~~ KIT
30.0000 mg | PACK | Freq: Once | SUBCUTANEOUS | Status: AC
  Administered 2022-01-23: 30 mg via SUBCUTANEOUS
  Filled 2022-01-23: qty 30

## 2022-01-25 DIAGNOSIS — S2242XA Multiple fractures of ribs, left side, initial encounter for closed fracture: Secondary | ICD-10-CM | POA: Diagnosis not present

## 2022-01-25 DIAGNOSIS — S299XXA Unspecified injury of thorax, initial encounter: Secondary | ICD-10-CM | POA: Diagnosis not present

## 2022-01-26 ENCOUNTER — Other Ambulatory Visit: Payer: Self-pay

## 2022-01-26 ENCOUNTER — Emergency Department: Payer: Medicare Other

## 2022-01-26 ENCOUNTER — Encounter: Payer: Self-pay | Admitting: Emergency Medicine

## 2022-01-26 ENCOUNTER — Inpatient Hospital Stay
Admission: EM | Admit: 2022-01-26 | Discharge: 2022-01-29 | DRG: 199 | Disposition: A | Discharge status: Home Health | Payer: Medicare Other | Attending: Internal Medicine | Admitting: Internal Medicine

## 2022-01-26 DIAGNOSIS — Z96641 Presence of right artificial hip joint: Secondary | ICD-10-CM | POA: Diagnosis present

## 2022-01-26 DIAGNOSIS — W19XXXA Unspecified fall, initial encounter: Secondary | ICD-10-CM | POA: Diagnosis not present

## 2022-01-26 DIAGNOSIS — Z79899 Other long term (current) drug therapy: Secondary | ICD-10-CM | POA: Diagnosis not present

## 2022-01-26 DIAGNOSIS — R531 Weakness: Secondary | ICD-10-CM | POA: Diagnosis not present

## 2022-01-26 DIAGNOSIS — C7800 Secondary malignant neoplasm of unspecified lung: Secondary | ICD-10-CM | POA: Diagnosis present

## 2022-01-26 DIAGNOSIS — M47812 Spondylosis without myelopathy or radiculopathy, cervical region: Secondary | ICD-10-CM | POA: Diagnosis not present

## 2022-01-26 DIAGNOSIS — S270XXA Traumatic pneumothorax, initial encounter: Secondary | ICD-10-CM | POA: Diagnosis not present

## 2022-01-26 DIAGNOSIS — R29898 Other symptoms and signs involving the musculoskeletal system: Secondary | ICD-10-CM

## 2022-01-26 DIAGNOSIS — C9110 Chronic lymphocytic leukemia of B-cell type not having achieved remission: Secondary | ICD-10-CM | POA: Diagnosis not present

## 2022-01-26 DIAGNOSIS — Z9842 Cataract extraction status, left eye: Secondary | ICD-10-CM

## 2022-01-26 DIAGNOSIS — F419 Anxiety disorder, unspecified: Secondary | ICD-10-CM | POA: Diagnosis present

## 2022-01-26 DIAGNOSIS — J9601 Acute respiratory failure with hypoxia: Secondary | ICD-10-CM | POA: Diagnosis not present

## 2022-01-26 DIAGNOSIS — S3993XA Unspecified injury of pelvis, initial encounter: Secondary | ICD-10-CM | POA: Diagnosis not present

## 2022-01-26 DIAGNOSIS — S2242XA Multiple fractures of ribs, left side, initial encounter for closed fracture: Secondary | ICD-10-CM | POA: Diagnosis not present

## 2022-01-26 DIAGNOSIS — R062 Wheezing: Secondary | ICD-10-CM | POA: Diagnosis not present

## 2022-01-26 DIAGNOSIS — J939 Pneumothorax, unspecified: Secondary | ICD-10-CM | POA: Diagnosis not present

## 2022-01-26 DIAGNOSIS — Y92009 Unspecified place in unspecified non-institutional (private) residence as the place of occurrence of the external cause: Secondary | ICD-10-CM

## 2022-01-26 DIAGNOSIS — S2249XA Multiple fractures of ribs, unspecified side, initial encounter for closed fracture: Secondary | ICD-10-CM | POA: Diagnosis not present

## 2022-01-26 DIAGNOSIS — Z79818 Long term (current) use of other agents affecting estrogen receptors and estrogen levels: Secondary | ICD-10-CM

## 2022-01-26 DIAGNOSIS — I10 Essential (primary) hypertension: Secondary | ICD-10-CM | POA: Diagnosis present

## 2022-01-26 DIAGNOSIS — S0990XA Unspecified injury of head, initial encounter: Secondary | ICD-10-CM | POA: Diagnosis not present

## 2022-01-26 DIAGNOSIS — I499 Cardiac arrhythmia, unspecified: Secondary | ICD-10-CM | POA: Diagnosis not present

## 2022-01-26 DIAGNOSIS — Z85828 Personal history of other malignant neoplasm of skin: Secondary | ICD-10-CM

## 2022-01-26 DIAGNOSIS — S2242XS Multiple fractures of ribs, left side, sequela: Secondary | ICD-10-CM | POA: Diagnosis not present

## 2022-01-26 DIAGNOSIS — S2241XA Multiple fractures of ribs, right side, initial encounter for closed fracture: Secondary | ICD-10-CM | POA: Diagnosis not present

## 2022-01-26 DIAGNOSIS — F32A Depression, unspecified: Secondary | ICD-10-CM | POA: Diagnosis not present

## 2022-01-26 DIAGNOSIS — M1990 Unspecified osteoarthritis, unspecified site: Secondary | ICD-10-CM | POA: Diagnosis not present

## 2022-01-26 DIAGNOSIS — W010XXA Fall on same level from slipping, tripping and stumbling without subsequent striking against object, initial encounter: Secondary | ICD-10-CM | POA: Diagnosis present

## 2022-01-26 DIAGNOSIS — Z87891 Personal history of nicotine dependence: Secondary | ICD-10-CM

## 2022-01-26 DIAGNOSIS — E785 Hyperlipidemia, unspecified: Secondary | ICD-10-CM | POA: Diagnosis not present

## 2022-01-26 DIAGNOSIS — C61 Malignant neoplasm of prostate: Secondary | ICD-10-CM | POA: Diagnosis not present

## 2022-01-26 DIAGNOSIS — Z96611 Presence of right artificial shoulder joint: Secondary | ICD-10-CM | POA: Diagnosis present

## 2022-01-26 DIAGNOSIS — Z8249 Family history of ischemic heart disease and other diseases of the circulatory system: Secondary | ICD-10-CM | POA: Diagnosis not present

## 2022-01-26 DIAGNOSIS — Z809 Family history of malignant neoplasm, unspecified: Secondary | ICD-10-CM

## 2022-01-26 DIAGNOSIS — Z7982 Long term (current) use of aspirin: Secondary | ICD-10-CM | POA: Diagnosis not present

## 2022-01-26 DIAGNOSIS — S270XXS Traumatic pneumothorax, sequela: Secondary | ICD-10-CM | POA: Diagnosis not present

## 2022-01-26 DIAGNOSIS — R0689 Other abnormalities of breathing: Secondary | ICD-10-CM | POA: Diagnosis not present

## 2022-01-26 DIAGNOSIS — R6889 Other general symptoms and signs: Secondary | ICD-10-CM | POA: Diagnosis not present

## 2022-01-26 DIAGNOSIS — Z9841 Cataract extraction status, right eye: Secondary | ICD-10-CM

## 2022-01-26 DIAGNOSIS — S3991XA Unspecified injury of abdomen, initial encounter: Secondary | ICD-10-CM | POA: Diagnosis not present

## 2022-01-26 DIAGNOSIS — Z743 Need for continuous supervision: Secondary | ICD-10-CM | POA: Diagnosis not present

## 2022-01-26 DIAGNOSIS — Z043 Encounter for examination and observation following other accident: Secondary | ICD-10-CM | POA: Diagnosis not present

## 2022-01-26 LAB — BASIC METABOLIC PANEL
Anion gap: 10 (ref 5–15)
BUN: 24 mg/dL — ABNORMAL HIGH (ref 8–23)
CO2: 28 mmol/L (ref 22–32)
Calcium: 9.4 mg/dL (ref 8.9–10.3)
Chloride: 101 mmol/L (ref 98–111)
Creatinine, Ser: 0.66 mg/dL (ref 0.61–1.24)
GFR, Estimated: >60 mL/min (ref >60)
Glucose, Bld: 144 mg/dL — ABNORMAL HIGH (ref 70–99)
Potassium: 3.5 mmol/L (ref 3.5–5.1)
Sodium: 139 mmol/L (ref 135–145)

## 2022-01-26 LAB — CBC WITH DIFFERENTIAL/PLATELET
Abs Immature Granulocytes: 0.06 10*3/uL (ref 0.00–0.07)
Basophils Absolute: 0 10*3/uL (ref 0.0–0.1)
Basophils Relative: 0 %
Eosinophils Absolute: 0 10*3/uL (ref 0.0–0.5)
Eosinophils Relative: 0 %
HCT: 41.1 % (ref 39.0–52.0)
Hemoglobin: 13.4 g/dL (ref 13.0–17.0)
Immature Granulocytes: 0 %
Lymphocytes Relative: 38 %
Lymphs Abs: 6.3 10*3/uL — ABNORMAL HIGH (ref 0.7–4.0)
MCH: 29.4 pg (ref 26.0–34.0)
MCHC: 32.6 g/dL (ref 30.0–36.0)
MCV: 90.1 fL (ref 80.0–100.0)
Monocytes Absolute: 0.9 10*3/uL (ref 0.1–1.0)
Monocytes Relative: 5 %
Neutro Abs: 9.5 10*3/uL — ABNORMAL HIGH (ref 1.7–7.7)
Neutrophils Relative %: 57 %
Platelets: 194 10*3/uL (ref 150–400)
RBC: 4.56 MIL/uL (ref 4.22–5.81)
RDW: 14.6 % (ref 11.5–15.5)
WBC: 16.8 10*3/uL — ABNORMAL HIGH (ref 4.0–10.5)
nRBC: 0 % (ref 0.0–0.2)

## 2022-01-26 LAB — HEPATIC FUNCTION PANEL
ALT: 21 U/L (ref 0–44)
AST: 20 U/L (ref 15–41)
Albumin: 4.2 g/dL (ref 3.5–5.0)
Alkaline Phosphatase: 49 U/L (ref 38–126)
Bilirubin, Direct: 0.2 mg/dL (ref 0.0–0.2)
Indirect Bilirubin: 1 mg/dL — ABNORMAL HIGH (ref 0.3–0.9)
Total Bilirubin: 1.2 mg/dL (ref 0.3–1.2)
Total Protein: 7 g/dL (ref 6.5–8.1)

## 2022-01-26 LAB — CBC
HCT: 41.1 % (ref 39.0–52.0)
Hemoglobin: 13.2 g/dL (ref 13.0–17.0)
MCH: 28.9 pg (ref 26.0–34.0)
MCHC: 32.1 g/dL (ref 30.0–36.0)
MCV: 89.9 fL (ref 80.0–100.0)
Platelets: 173 10*3/uL (ref 150–400)
RBC: 4.57 MIL/uL (ref 4.22–5.81)
RDW: 14.6 % (ref 11.5–15.5)
WBC: 16.6 10*3/uL — ABNORMAL HIGH (ref 4.0–10.5)
nRBC: 0 % (ref 0.0–0.2)

## 2022-01-26 LAB — TROPONIN I (HIGH SENSITIVITY): Troponin I (High Sensitivity): 7 ng/L (ref <18)

## 2022-01-26 MED ORDER — CARVEDILOL 6.25 MG PO TABS
12.5000 mg | ORAL_TABLET | Freq: Two times a day (BID) | ORAL | Status: DC
  Administered 2022-01-26 – 2022-01-29 (×6): 12.5 mg via ORAL
  Filled 2022-01-26 (×6): qty 2

## 2022-01-26 MED ORDER — ONDANSETRON HCL 4 MG/2ML IJ SOLN: 4.0000 mg | Freq: Four times a day (QID) | INTRAMUSCULAR | Status: DC | PRN

## 2022-01-26 MED ORDER — AMLODIPINE BESYLATE 5 MG PO TABS
5.0000 mg | ORAL_TABLET | Freq: Two times a day (BID) | ORAL | Status: DC
  Administered 2022-01-26 – 2022-01-29 (×6): 5 mg via ORAL
  Filled 2022-01-26 (×6): qty 1

## 2022-01-26 MED ORDER — ACETAMINOPHEN 325 MG PO TABS: 650.0000 mg | ORAL_TABLET | Freq: Four times a day (QID) | ORAL | Status: DC | PRN

## 2022-01-26 MED ORDER — MORPHINE SULFATE (PF) 4 MG/ML IV SOLN
4.0000 mg | Freq: Once | INTRAVENOUS | Status: AC
  Administered 2022-01-26: 4 mg via INTRAVENOUS
  Filled 2022-01-26: qty 1

## 2022-01-26 MED ORDER — LIDOCAINE 5 % EX PTCH
1.0000 | MEDICATED_PATCH | CUTANEOUS | Status: DC
  Administered 2022-01-26 – 2022-01-29 (×4): 1 via TRANSDERMAL
  Filled 2022-01-26 (×5): qty 1

## 2022-01-26 MED ORDER — ALUM & MAG HYDROXIDE-SIMETH 200-200-20 MG/5ML PO SUSP
30.0000 mL | Freq: Once | ORAL | Status: AC
  Administered 2022-01-26: 30 mL via ORAL
  Filled 2022-01-26: qty 30

## 2022-01-26 MED ORDER — IOHEXOL 300 MG/ML  SOLN
100.0000 mL | Freq: Once | INTRAMUSCULAR | Status: AC | PRN
  Administered 2022-01-26: 100 mL via INTRAVENOUS

## 2022-01-26 MED ORDER — OXYCODONE HCL 5 MG PO TABS
5.0000 mg | ORAL_TABLET | ORAL | Status: DC | PRN
  Administered 2022-01-26 – 2022-01-28 (×5): 5 mg via ORAL
  Filled 2022-01-26 (×5): qty 1

## 2022-01-26 MED ORDER — SENNOSIDES-DOCUSATE SODIUM 8.6-50 MG PO TABS: 1.0000 | ORAL_TABLET | Freq: Every evening | ORAL | Status: DC | PRN

## 2022-01-26 MED ORDER — ACETAMINOPHEN 650 MG RE SUPP: 650.0000 mg | Freq: Four times a day (QID) | RECTAL | Status: DC | PRN

## 2022-01-26 MED ORDER — ONDANSETRON HCL 4 MG PO TABS: 4.0000 mg | ORAL_TABLET | Freq: Four times a day (QID) | ORAL | Status: DC | PRN

## 2022-01-26 MED ORDER — LISINOPRIL 10 MG PO TABS
10.0000 mg | ORAL_TABLET | Freq: Every day | ORAL | Status: DC
  Administered 2022-01-26 – 2022-01-29 (×4): 10 mg via ORAL
  Filled 2022-01-26 (×4): qty 1

## 2022-01-26 MED ORDER — FLUOXETINE HCL 20 MG PO CAPS
80.0000 mg | ORAL_CAPSULE | Freq: Every day | ORAL | Status: DC
  Administered 2022-01-27 – 2022-01-29 (×3): 80 mg via ORAL
  Filled 2022-01-26 (×4): qty 4

## 2022-01-26 MED ORDER — HYDROMORPHONE HCL 1 MG/ML IJ SOLN
0.5000 mg | INTRAMUSCULAR | Status: DC | PRN
  Administered 2022-01-26 – 2022-01-27 (×4): 1 mg via INTRAVENOUS
  Filled 2022-01-26 (×4): qty 1

## 2022-01-26 MED ORDER — ENOXAPARIN SODIUM 40 MG/0.4ML IJ SOSY
40.0000 mg | PREFILLED_SYRINGE | INTRAMUSCULAR | Status: DC
  Administered 2022-01-26: 40 mg via SUBCUTANEOUS
  Filled 2022-01-26: qty 0.4

## 2022-01-26 MED ORDER — FLUTICASONE PROPIONATE 50 MCG/ACT NA SUSP
1.0000 | Freq: Every day | NASAL | Status: DC | PRN
  Administered 2022-01-27: 1 via NASAL
  Filled 2022-01-26: qty 16

## 2022-01-26 MED ORDER — SODIUM CHLORIDE 0.9% FLUSH
3.0000 mL | Freq: Two times a day (BID) | INTRAVENOUS | Status: DC
  Administered 2022-01-26 – 2022-01-29 (×6): 3 mL via INTRAVENOUS

## 2022-01-26 MED ORDER — IPRATROPIUM-ALBUTEROL 0.5-2.5 (3) MG/3ML IN SOLN
3.0000 mL | RESPIRATORY_TRACT | Status: DC | PRN
  Administered 2022-01-26 – 2022-01-27 (×2): 3 mL via RESPIRATORY_TRACT
  Filled 2022-01-26 (×2): qty 3

## 2022-01-26 MED ORDER — MONTELUKAST SODIUM 10 MG PO TABS
10.0000 mg | ORAL_TABLET | Freq: Every day | ORAL | Status: DC
  Administered 2022-01-26 – 2022-01-29 (×4): 10 mg via ORAL
  Filled 2022-01-26 (×4): qty 1

## 2022-01-26 MED ORDER — ATORVASTATIN CALCIUM 20 MG PO TABS
20.0000 mg | ORAL_TABLET | Freq: Every day | ORAL | Status: DC
  Administered 2022-01-27 – 2022-01-29 (×3): 20 mg via ORAL
  Filled 2022-01-26 (×4): qty 1

## 2022-01-26 MED ORDER — HYDROCHLOROTHIAZIDE 25 MG PO TABS
25.0000 mg | ORAL_TABLET | Freq: Every day | ORAL | Status: DC
  Administered 2022-01-27 – 2022-01-29 (×3): 25 mg via ORAL
  Filled 2022-01-26 (×4): qty 1

## 2022-01-26 NOTE — ED Triage Notes (Addendum)
Pt in via [facility] from home with c/o SOB. Pt fell yesterday and broke his ribs and was seen at [facility]. Pt got up today and started to have SOB. EMS reports rr 30/min. EMS reports O2 sats in 80's upon their arrival. Pt was placed on 4L of oxygen and now O2 is 94% 174/84

## 2022-01-26 NOTE — ED Provider Notes (Signed)
[facility] Provider Note    Event Date/Time   First MD Initiated Contact with Patient 01/26/22 1404     (approximate)   History   Chief Complaint Shortness of Breath   HPI  Brian Bean is a 81 y.o. male with past medical history of hypertension, CLL, and metastatic prostate cancer who presents to the ED complaining of shortness of breath.  Patient reports that he initially lost his balance and fell yesterday morning at 3 AM while attempting to let the dogs out.  He fell onto his left side and had immediate onset of pain along his left chest wall.  He was seen at the [facility] walk-in clinic, subsequently diagnosed with 4 posterior rib fractures.  He was prescribed Norco and discharged home for close follow-up, states that he was feeling worse ever since waking up this morning.  He describes weakness, increased left chest wall pain, and difficulty breathing.  Pain is not alleviated by his Norco, he denies significant fevers or cough.  He does admit to hitting his head when he fell, denies any loss of consciousness.  He denies any headache or neck pain, the only anticoagulant he takes is a daily baby aspirin.     Physical Exam   Triage Vital Signs: ED Triage Vitals  Enc Vitals Group     BP 01/26/22 1351 (!) 159/67     Pulse Rate 01/26/22 1351 92     Resp 01/26/22 1351 (!) 30     Temp 01/26/22 1351 98.2 F (36.8 C)     Temp Source 01/26/22 1351 Oral     SpO2 01/26/22 1351 (!) 89 %     Weight --      Height --      Head Circumference --      Peak Flow --      Pain Score 01/26/22 1352 5     Pain Loc --      Pain Edu? --      Excl. in [place]? --     Most recent vital signs: Vitals:   01/26/22 1351 01/26/22 1405  BP: (!) 159/67 (!) 163/80  Pulse: 92 86  Resp: (!) 30 19  Temp: 98.2 F (36.8 C)   SpO2: (!) 89% 93%    Constitutional: Alert and oriented. Eyes: Conjunctivae are normal. Head: Atraumatic. Nose: No congestion/rhinnorhea. Mouth/Throat:  Mucous membranes are moist.  Neck: No midline cervical spine tenderness to palpation. Cardiovascular: Normal rate, regular rhythm. Grossly normal heart sounds.  2+ radial pulses bilaterally. Respiratory: Normal respiratory effort.  No retractions. Lungs CTAB.  Left chest wall tenderness to light palpation. Gastrointestinal: Soft and nontender. No distention. Musculoskeletal: No lower extremity tenderness nor edema.  No upper extremity bony tenderness to palpation. Neurologic:  Normal speech and language. No gross focal neurologic deficits are appreciated.    ED Results / Procedures / Treatments   Labs (all labs ordered are listed, but only abnormal results are displayed) Labs Reviewed  CBC - Abnormal; Notable for the following components:      Result Value   WBC 16.6 (*)    All other components within normal limits  BASIC METABOLIC PANEL - Abnormal; Notable for the following components:   Glucose, Bld 144 (*)    BUN 24 (*)    All other components within normal limits  HEPATIC FUNCTION PANEL - Abnormal; Notable for the following components:   Indirect Bilirubin 1.0 (*)    All other components within normal limits  TROPONIN  I (HIGH SENSITIVITY)     EKG  ED ECG REPORT I, Blake Divine, the attending physician, personally viewed and interpreted this ECG.   Date: 01/26/2022  EKG Time: 13:56  Rate: 91  Rhythm: normal sinus rhythm  Axis: Normal  Intervals:right bundle branch block  ST&T Change: None  RADIOLOGY CT head reviewed and interpreted by me with no hemorrhage or midline shift.  CT cervical spine reviewed and interpreted by me with no fracture or dislocation.  PROCEDURES:  Critical Care performed: Yes, see critical care procedure note(s)  .Critical Care  Performed by: Blake Divine, MD Authorized by: Blake Divine, MD   Critical care provider statement:    Critical care time (minutes):  30   Critical care time was exclusive of:  Separately billable  procedures and treating other patients and teaching time   Critical care was necessary to treat or prevent imminent or life-threatening deterioration of the following conditions:  Respiratory failure   Critical care was time spent personally by me on the following activities:  Development of treatment plan with patient or surrogate, discussions with consultants, evaluation of patient's response to treatment, examination of patient, ordering and review of laboratory studies, ordering and review of radiographic studies, ordering and performing treatments and interventions, pulse oximetry, re-evaluation of patient's condition and review of old charts   I assumed direction of critical care for this patient from another provider in my specialty: no     Care discussed with: admitting provider      MEDICATIONS ORDERED IN ED: Medications  lidocaine (LIDODERM) 5 % 1 patch (1 patch Transdermal Patch Applied 01/26/22 1428)  morphine (PF) 4 MG/ML injection 4 mg (4 mg Intravenous Given 01/26/22 1429)  iohexol (OMNIPAQUE) 300 MG/ML solution 100 mL (100 mLs Intravenous Contrast Given 01/26/22 1437)     IMPRESSION / MDM / [place] / ED COURSE  I reviewed the triage vital signs and the nursing notes.                              81 y.o. male with past medical history of hypertension, CLL, and prostate cancer who presents to the ED complaining of left chest wall pain and worsening difficulty breathing after falling early yesterday morning and subsequently being diagnosed with 4 rib fractures.  Patient's presentation is most consistent with acute presentation with potential threat to life or bodily function.  Differential diagnosis includes, but is not limited to, rib fractures, hemothorax, pneumothorax, pulmonary contusion, splenic injury, intracranial injury, cervical spine injury.  Patient uncomfortable appearing and in mild respiratory distress on my assessment, noted to be hypoxic to 89% on room  air, subsequently placed on 4 L nasal cannula in triage.  This was weaned to 2 L during my assessment, after which she is maintaining oxygen saturations at 93%, respiratory rate does appear to be gradually improving.  Remainder vital signs are reassuring, initial labs are reassuring with no significant anemia, does have leukocytosis and we will further assess with CT scan for developing pneumonia versus pneumo or hemothorax.  No significant electrolyte abnormality noted, LFTs within normal limits, troponin also unremarkable.  We will treat pain with IV morphine and place Lidoderm patch, anticipate admission to the hospital.  CT head and cervical spine are negative for acute traumatic injury.  CT of chest/abdomen/pelvis does show multiple left-sided rib fractures with small associated pneumothorax and small effusion, likely hemothorax.  Patient remains hemodynamically stable at this time,  oxygen saturations and respiratory rate also remain improved on 2 L nasal cannula.  Given very small size of pneumothorax and hemothorax, do not feel chest tube is indicated at this time.  Case discussed with hospitalist for admission.      FINAL CLINICAL IMPRESSION(S) / ED DIAGNOSES   Final diagnoses:  Closed fracture of multiple ribs of left side, initial encounter  Traumatic pneumothorax, initial encounter     Rx / DC Orders   ED Discharge Orders     None        Note:  This document was prepared using Dragon voice recognition software and may include unintentional dictation errors.   Blake Divine, MD 01/26/22 [phone]

## 2022-01-26 NOTE — ED Notes (Signed)
Patient transported to CT 

## 2022-01-26 NOTE — H&P (Signed)
History and Physical    Brian Bean HER:[id number] DOB: [birth year] DOA: 01/26/2022  PCP: Lajean Manes, MD  Patient coming from: Home Chief Complaint: Pain, SOB  HPI: Brian Bean is a 80 y.o. male with medical history significant of arthritis, HTN, CLL (watchful waiting), Stage IV prostate cancer on Lupron q 4 months, BPH, HLD, h/o skin cancer, ED who presents with pain and SOB.  Brian Bean reports that yesterday morning, he fell when letting the dogs out.  He presented to urgent care, was found to have non displaced rib fractures and was sent home with pain medications.  He awoke this morning feeling worse and noting increased SOB.  On arrival to the ED, he was noted to be < 90% on room air, and this recovered with oxygen supplementation.  He noted constant 4/10 pain in the area of the fractured ribs.  He will get occasional sharp radiating pain to the lateral chest and frontal chest with movement and deep breathing.  He further notes a sensation of weakness.  He has no other symptoms.  He did not lose consciousness with the fall.    ED Course: In the ED, he was noted to have a BUN of 24 (unchanged from yesterday, WBC of 16.6 (chronically elevated lymphocytes 2/2 CLL, differential from today pending). CT chest showed again the rib fractures and a small apical pneumothorax and small left pleural effusion.  He has multiple pulmonary nodules which are known metastatic disease from his prostate cancer.  He has fractures of the 4th through ninth rib.  Given age and number of ribs broken, he is at higher risk for morbidity from the fractures.   Review of Systems: As per HPI otherwise all other systems reviewed and are negative.  Past Medical History:  Diagnosis Date   Anxiety    Arthritis    Cancer ([place])    skin lesion scalp   CLL (chronic lymphocytic leukemia) ([place]) 01/06/2013   CLL (chronic lymphocytic leukemia) ([place])    Depression    Head injury    fell from a horse   Hypertension     Lymphocytosis    PONV (postoperative nausea and vomiting)    extremely sick did not get sick after hip surgery in 2013   Prostate cancer [facility])     Past Surgical History:  Procedure Laterality Date   APPENDECTOMY     BRONCHIAL BIOPSY  04/16/2021   Procedure: BRONCHIAL BIOPSIES;  Surgeon: Garner Nash, DO;  Location: [facility];  Service: Pulmonary;;   BRONCHIAL BRUSHINGS  04/16/2021   Procedure: BRONCHIAL BRUSHINGS;  Surgeon: Garner Nash, DO;  Location: [place] [facility];  Service: Pulmonary;;   BRONCHIAL NEEDLE ASPIRATION BIOPSY  04/16/2021   Procedure: BRONCHIAL NEEDLE ASPIRATION BIOPSIES;  Surgeon: Garner Nash, DO;  Location: [place] [facility];  Service: Pulmonary;;   BRONCHIAL WASHINGS  04/16/2021   Procedure: BRONCHIAL WASHINGS;  Surgeon: Garner Nash, DO;  Location: [place] [facility];  Service: Pulmonary;;   CATARACT EXTRACTION, BILATERAL     CERVICAL [place] SURGERY  00   COLONOSCOPY  2011   [place]     rt ankle   HERNIA REPAIR     rt and  undistended testicle   PROSTATE BIOPSY     REVERSE SHOULDER ARTHROPLASTY Right 05/01/2020   Procedure: REVERSE SHOULDER ARTHROPLASTY;  Surgeon: Corky Mull, MD;  Location: [facility];  Service: Orthopedics;  Laterality: Right;   rt hip  12   pinned   TOTAL HIP  ARTHROPLASTY  05/03/2012   Procedure: TOTAL HIP ARTHROPLASTY;  Surgeon: Kerin Salen, MD;  Location: [place];  Service: Orthopedics;  Laterality: Right;   VIDEO BRONCHOSCOPY WITH ENDOBRONCHIAL NAVIGATION Right 04/16/2021   Procedure: VIDEO BRONCHOSCOPY WITH ENDOBRONCHIAL NAVIGATION;  Surgeon: Garner Nash, DO;  Location: [place];  Service: Pulmonary;  Laterality: Right;  ION w/ CIOS   VIDEO BRONCHOSCOPY WITH RADIAL ENDOBRONCHIAL ULTRASOUND  04/16/2021   Procedure: RADIAL ENDOBRONCHIAL ULTRASOUND;  Surgeon: Garner Nash, DO;  Location: [place] [facility];  Service: Pulmonary;;    Social History  reports that he quit smoking about 44 years ago. His smoking use  included cigarettes. He has a 5.00 pack-year smoking history. He has never used smokeless tobacco. He reports current alcohol use of about 21.0 standard drinks of alcohol per week. He reports that he does not use drugs.  No Known Allergies  Family History  Problem Relation Age of Onset   Heart disease Mother    Cancer Father        unk type of cancer    Prior to Admission medications   Medication Sig Start Date End Date Taking? Authorizing Provider  amLODipine (NORVASC) 5 MG tablet Take 5 mg by mouth 2 (two) times daily.     [provider]  aspirin EC 81 MG tablet Take 81 mg by mouth daily. Swallow whole.    [provider]  atorvastatin (LIPITOR) 20 MG tablet Take 20 mg by mouth daily. 04/09/21   [provider]  carvedilol (COREG) 12.5 MG tablet Take 12.5 mg by mouth 2 (two) times daily with a meal.     [provider]  dexamethasone (DECADRON) 4 MG tablet Take 2 tablets (8 mg total) by mouth 2 (two) times daily. Start the day before Taxotere. Then daily after chemo for 2 days. Patient not taking: Reported on 12/18/2021 11/17/21   Earlie Server, MD  fluorouracil (EFUDEX) 5 % cream Apply 1 application topically 2 (two) times daily as needed (precancerous spots).    [provider]  FLUoxetine (PROZAC) 40 MG capsule Take 80 mg by mouth daily.    [provider]  fluticasone (FLONASE ALLERGY RELIEF) 50 MCG/ACT nasal spray Place 1 spray into both nostrils daily as needed for allergies or rhinitis.    [provider]  hydrochlorothiazide (HYDRODIURIL) 25 MG tablet Take 25 mg by mouth daily.     [provider]  IPRATROPIUM BROMIDE IN Inhale into the lungs as needed.    [provider]  ketoconazole (NIZORAL) 2 % shampoo Apply 1 application topically daily as needed for irritation.    [provider]  lisinopril (PRINIVIL,ZESTRIL) 10 MG tablet Take 10 mg by mouth daily.    [provider]  Melatonin 10  MG TABS Take 10 mg by mouth at bedtime.    [provider]  montelukast (SINGULAIR) 10 MG tablet Take 1 tablet (10 mg total) by mouth See admin instructions. Take 1 tablet daily. Start the day before Taxotere. Then daily after chemo for 2 days 11/18/21   Earlie Server, MD  naproxen sodium (ALEVE) 220 MG tablet Take 220 mg by mouth daily as needed (pain).    [provider]  ondansetron (ZOFRAN) 8 MG tablet Take 1 tablet (8 mg total) by mouth 2 (two) times daily as needed for refractory nausea / vomiting. Patient not taking: Reported on 12/18/2021 11/17/21   Earlie Server, MD  prochlorperazine (COMPAZINE) 10 MG tablet Take 1 tablet (10 mg total) by  mouth every 6 (six) hours as needed (Nausea or vomiting). Patient not taking: Reported on 12/18/2021 11/17/21   Earlie Server, MD  tadalafil (CIALIS) 20 MG tablet Take 20 mg by mouth daily as needed for erectile dysfunction.    [provider]  tamsulosin (FLOMAX) 0.4 MG CAPS capsule Take 0.4 mg by mouth daily.    [provider]    Physical Exam: Vitals:   01/26/22 1351 01/26/22 1405  BP: (!) 159/67 (!) 163/80  Pulse: 92 86  Resp: (!) 30 19  Temp: 98.2 F (36.8 C)   TempSrc: Oral   SpO2: (!) 89% 93%    Constitutional: NAD, calm, He is uncomfortable in bed due to pain.  Eyes: ids and conjunctivae normal ENMT: Mucous membranes are dry. Poor dentition Neck: normal, supple Respiratory: He is unable to take a deep breath without pain, he has crackles at the base on the left.  He has some mild upper airway wheezing.  Cardiovascular: RR, NR, no murmur noted, he has 2+ pitting edema to the knees bilaterally, which he reports is chronic.   Abdomen: + distention, but soft.  NT, +BS Musculoskeletal: Normal bulk and tone, he has severe tenderness over the ribs on the posterior back on the left.  Skin: no rashes, lesions, ulcers on exposed skin.  He has chronic skin changes on the legs 2/2 stasis Neurologic: Non focal, moves easily in  bed, but with pain.  Psychiatric: Normal judgment and insight. Alert and oriented x 3. Normal mood.    Labs on Admission: I have personally reviewed following labs and imaging studies  CBC: Recent Labs  Lab 01/26/22 1356  WBC 16.6*  HGB 13.2  HCT 41.1  MCV 89.9  PLT 062    Basic Metabolic Panel: Recent Labs  Lab 01/26/22 1356  NA 139  K 3.5  CL 101  CO2 28  GLUCOSE 144*  BUN 24*  CREATININE 0.66  CALCIUM 9.4    GFR: CrCl cannot be calculated (Unknown ideal weight.).  Liver Function Tests: Recent Labs  Lab 01/26/22 1356  AST 20  ALT 21  ALKPHOS 49  BILITOT 1.2  PROT 7.0  ALBUMIN 4.2       Radiological Exams on Admission: CT Head Wo Contrast  Result Date: 01/26/2022 CLINICAL DATA:  Status post fall, shortness of breath, multiple broken ribs EXAM: CT HEAD WITHOUT CONTRAST CT CERVICAL SPINE WITHOUT CONTRAST TECHNIQUE: Multidetector CT imaging of the head and cervical spine was performed following the standard protocol without intravenous contrast. Multiplanar CT image reconstructions of the cervical spine were also generated. RADIATION DOSE REDUCTION: This exam was performed according to the departmental dose-optimization program which includes automated exposure control, adjustment of the mA and/or kV according to patient size and/or use of iterative reconstruction technique. COMPARISON:  None Available. FINDINGS: CT HEAD FINDINGS Brain: No evidence of acute infarction, hemorrhage, extra-axial collection, ventriculomegaly, or mass effect. Generalized cerebral atrophy. Periventricular white matter low attenuation likely secondary to microangiopathy. Vascular: Cerebrovascular atherosclerotic calcifications are noted. No hyperdense vessels. Skull: Negative for fracture or focal lesion. Sinuses/Orbits: Visualized portions of the orbits are unremarkable. Visualized portions of the paranasal sinuses are unremarkable. Visualized portions of the mastoid air cells are  unremarkable. Other: None. CT CERVICAL SPINE FINDINGS Alignment: Normal. Skull base and vertebrae: No acute fracture. No primary bone lesion or focal pathologic process. Old spinous process avulsion fractures. Soft tissues and spinal canal: No prevertebral fluid or swelling. No visible canal hematoma. Disc levels: Anterior cervical fusion at C5-6  without hardware failure or complication and solid osseous bridging across the disc space. Developmental fusion of the C2-3 vertebral bodies and posterior elements. At C3-4 there is degenerative disease with disc height loss, broad-based disc osteophyte complex, bilateral uncovertebral degenerative changes, bilateral facet arthropathy, and bilateral foraminal stenosis. At C4-5 there is a broad-based disc osteophyte complex, bilateral uncovertebral degenerative changes, bilateral facet arthropathy and bilateral foraminal stenosis. At C5-6 there is interbody fusion, mild bilateral foraminal stenosis and bilateral mild facet arthropathy. At C6-7 there is disc height loss, bilateral uncovertebral degenerative changes, and mild bilateral foraminal stenosis. Upper chest: 9 mm left apical pulmonary nodule. Trace left apical pneumothorax. Other: Bilateral carotid artery atherosclerosis. IMPRESSION: 1. No acute intracranial pathology. 2.  No acute osseous injury of the cervical spine. 3. Cervical spine spondylosis as described above. 4. Trace left apical pneumothorax. Electronically Signed   By: Kathreen Devoid M.D.   On: 01/26/2022 15:12   CT Cervical Spine Wo Contrast  Result Date: 01/26/2022 CLINICAL DATA:  Status post fall, shortness of breath, multiple broken ribs EXAM: CT HEAD WITHOUT CONTRAST CT CERVICAL SPINE WITHOUT CONTRAST TECHNIQUE: Multidetector CT imaging of the head and cervical spine was performed following the standard protocol without intravenous contrast. Multiplanar CT image reconstructions of the cervical spine were also generated. RADIATION DOSE REDUCTION:  This exam was performed according to the departmental dose-optimization program which includes automated exposure control, adjustment of the mA and/or kV according to patient size and/or use of iterative reconstruction technique. COMPARISON:  None Available. FINDINGS: CT HEAD FINDINGS Brain: No evidence of acute infarction, hemorrhage, extra-axial collection, ventriculomegaly, or mass effect. Generalized cerebral atrophy. Periventricular white matter low attenuation likely secondary to microangiopathy. Vascular: Cerebrovascular atherosclerotic calcifications are noted. No hyperdense vessels. Skull: Negative for fracture or focal lesion. Sinuses/Orbits: Visualized portions of the orbits are unremarkable. Visualized portions of the paranasal sinuses are unremarkable. Visualized portions of the mastoid air cells are unremarkable. Other: None. CT CERVICAL SPINE FINDINGS Alignment: Normal. Skull base and vertebrae: No acute fracture. No primary bone lesion or focal pathologic process. Old spinous process avulsion fractures. Soft tissues and spinal canal: No prevertebral fluid or swelling. No visible canal hematoma. Disc levels: Anterior cervical fusion at C5-6 without hardware failure or complication and solid osseous bridging across the disc space. Developmental fusion of the C2-3 vertebral bodies and posterior elements. At C3-4 there is degenerative disease with disc height loss, broad-based disc osteophyte complex, bilateral uncovertebral degenerative changes, bilateral facet arthropathy, and bilateral foraminal stenosis. At C4-5 there is a broad-based disc osteophyte complex, bilateral uncovertebral degenerative changes, bilateral facet arthropathy and bilateral foraminal stenosis. At C5-6 there is interbody fusion, mild bilateral foraminal stenosis and bilateral mild facet arthropathy. At C6-7 there is disc height loss, bilateral uncovertebral degenerative changes, and mild bilateral foraminal stenosis. Upper chest:  9 mm left apical pulmonary nodule. Trace left apical pneumothorax. Other: Bilateral carotid artery atherosclerosis. IMPRESSION: 1. No acute intracranial pathology. 2.  No acute osseous injury of the cervical spine. 3. Cervical spine spondylosis as described above. 4. Trace left apical pneumothorax. Electronically Signed   By: Kathreen Devoid M.D.   On: 01/26/2022 15:12   CT CHEST ABDOMEN PELVIS W CONTRAST  Result Date: 01/26/2022 CLINICAL DATA:  Blunt poly trauma, status post fall yesterday. EXAM: CT CHEST, ABDOMEN, AND PELVIS WITH CONTRAST TECHNIQUE: Multidetector CT imaging of the chest, abdomen and pelvis was performed following the standard protocol during bolus administration of intravenous contrast. RADIATION DOSE REDUCTION: This exam was performed according to the  departmental dose-optimization program which includes automated exposure control, adjustment of the mA and/or kV according to patient size and/or use of iterative reconstruction technique. CONTRAST:  181mL OMNIPAQUE IOHEXOL 300 MG/ML  SOLN COMPARISON:  PET-CT 10/23/2021, CT chest 11/07/2021 FINDINGS: CT CHEST FINDINGS Cardiovascular: Stable cardiomegaly. No pericardial effusion. Thoracic aortic atherosclerosis. Coronary artery atherosclerosis. Thoracic aorta is normal in caliber. Mediastinum/Nodes: Multiple small subcentimeter mediastinal lymph nodes. No axillary or hilar lymphadenopathy. Trachea is unremarkable. Thyroid gland is unremarkable. Fluid-filled esophagus as can be seen with gastroesophageal reflux. Lungs/Pleura: Small left pleural effusion with left basilar atelectasis. Scattered bilateral pulmonary nodules again noted similar in appearance to the prior examination of 11/07/2021. Largest left upper lobe pulmonary nodule measures 7 mm. Largest right upper lobe pulmonary nodule measures 11 mm. Largest left lower lobe pulmonary nodule in the superior segment measures 12 mm. Small left pneumothorax. Musculoskeletal: Nondisplaced fracture of  the left posterolateral fourth rib. Nondisplaced fracture of the left posterolateral fifth rib. Minimally displaced fracture of the left posterior sixth rib. Minimally displaced fracture of the left lateral fourth rib. Comminuted fracture of the left posterior seventh rib. Nondisplaced fracture of the left posterior eighth rib. Nondisplaced fracture of the left posterior ninth rib. CT ABDOMEN PELVIS FINDINGS Hepatobiliary: No focal liver abnormality is seen. No gallstones, gallbladder wall thickening, or biliary dilatation. Pancreas: Unremarkable. No pancreatic ductal dilatation or surrounding inflammatory changes. Spleen: Normal in size without focal abnormality. Adrenals/Urinary Tract: Adrenal glands are unremarkable. Kidneys are normal, without renal calculi, focal lesion, or hydronephrosis. Left parapelvic cyst. Bladder is unremarkable. Stomach/Bowel: Stomach is within normal limits. Diverticulosis without evidence of diverticulitis. Large amount of stool in the ascending colon. No evidence of bowel wall thickening, distention, or inflammatory changes. Vascular/Lymphatic: No significant vascular findings are present. No enlarged abdominal or pelvic lymph nodes. Reproductive: Prostate is unremarkable. Other: Fat containing left inguinal hernia.  No ascites. Musculoskeletal: Dextrocurvature of the lumbar spine. Bilateral facet arthropathy throughout the lumbar spine. Right total hip arthroplasty. Minimal osteoarthritis of the left hip. No aggressive osseous lesion. IMPRESSION: 1. Multiple acute left-sided rib fractures as described above. Small left pneumothorax. 2. Small left pleural effusion with left basilar atelectasis. 3. No acute injury of the abdomen or pelvis. 4. Diverticulosis without evidence of diverticulitis. 5. Multiple bilateral pulmonary nodules unchanged compared with most recent CT of the chest dated 11/07/2021 consistent with stable metastatic disease. 6. Aortic Atherosclerosis (ICD10-I70.0).  Coronary artery atherosclerosis. Electronically Signed   By: Kathreen Devoid M.D.   On: 01/26/2022 15:07    EKG: Independently reviewed. NSR, RBBB, unchanged from previous.   Assessment/Plan  Multiple rib fractures New onset hypoxia Small apical pneumothorax - Given number of fractures, age, h/o stage IV prostate cancer, risk of morbidity is high - Oxygen to keep saturation > 92% - Pain control with tylenol, oxycodone, hydromorphone - Pulmonary toilet with IS daily - Repeat CXR in the AM to evaluate PTX - no indication for chest tube currently.  Repeat CXR for any change in status.  - PT/OT - Telemetry - Lidocaine patch placed by ED team.  - Duonebs as needed - Continue singulair  CLL (chronic lymphocytic leukemia) ([place]) Malignant neoplasm of prostate metastatic to lung ([place]) - Add on differential today - WBC is consistent with known h/o CLL - Currently receiving Lupron SQ for metastatic prostate cancer - Would consider contacting patients Hem/Onc team tomorrow about this diagnosis.     Arthritis - Chronic, pain control as above    Hypertension - Continue home amlodipine (source  of swelling likely per patient report), hctz, coreg, lisinopril  Depression - Continue prozac  H/O skin cancer - Monitor, on efudex PRN at home  H/O BPH - Reports not being on tamsulosin, monitor.   DVT prophylaxis: Lovenox  Code Status:   Full  Disposition Plan:   Patient is from:  Home  Anticipated DC to:  Home  Anticipated DC date:  01/27/22  Anticipated DC barriers: Pain control  Consults called:  None  Admission status:  Obs, telemetry  Severity of Illness: The appropriate patient status for this patient is OBSERVATION. Observation status is judged to be reasonable and necessary in order to provide the required intensity of service to ensure the patient's safety. The patient's presenting symptoms, physical exam findings, and initial radiographic and laboratory data in the context of their  medical condition is felt to place them at decreased risk for further clinical deterioration. Furthermore, it is anticipated that the patient will be medically stable for discharge from the hospital within 2 midnights of admission.     Gilles Chiquito MD [facility]  How to contact the [facility] Attending or Consulting provider [place] or covering provider during after hours [place], for this patient?   Check the care team in [facility] and look for a) attending/consulting [facility] provider listed and b) the [facility] team listed Log into [email or web address] and use [place]'s universal password to access. If you do not have the password, please contact the hospital operator. Locate the [facility] provider you are looking for under [facility] and page to a number that you can be directly reached. If you still have difficulty reaching the provider, please page the [facility] (Director on Call) for the Hospitalists listed on amion for assistance.  01/26/2022, 4:19 PM

## 2022-01-27 ENCOUNTER — Observation Stay: Payer: Medicare Other

## 2022-01-27 DIAGNOSIS — C61 Malignant neoplasm of prostate: Secondary | ICD-10-CM | POA: Diagnosis present

## 2022-01-27 DIAGNOSIS — S270XXS Traumatic pneumothorax, sequela: Secondary | ICD-10-CM | POA: Diagnosis not present

## 2022-01-27 DIAGNOSIS — Z79899 Other long term (current) drug therapy: Secondary | ICD-10-CM | POA: Diagnosis not present

## 2022-01-27 DIAGNOSIS — C9110 Chronic lymphocytic leukemia of B-cell type not having achieved remission: Secondary | ICD-10-CM

## 2022-01-27 DIAGNOSIS — Z9842 Cataract extraction status, left eye: Secondary | ICD-10-CM | POA: Diagnosis not present

## 2022-01-27 DIAGNOSIS — Z79818 Long term (current) use of other agents affecting estrogen receptors and estrogen levels: Secondary | ICD-10-CM | POA: Diagnosis not present

## 2022-01-27 DIAGNOSIS — W010XXA Fall on same level from slipping, tripping and stumbling without subsequent striking against object, initial encounter: Secondary | ICD-10-CM | POA: Diagnosis present

## 2022-01-27 DIAGNOSIS — C7800 Secondary malignant neoplasm of unspecified lung: Secondary | ICD-10-CM

## 2022-01-27 DIAGNOSIS — S42002A Fracture of unspecified part of left clavicle, initial encounter for closed fracture: Secondary | ICD-10-CM | POA: Diagnosis not present

## 2022-01-27 DIAGNOSIS — Z8249 Family history of ischemic heart disease and other diseases of the circulatory system: Secondary | ICD-10-CM | POA: Diagnosis not present

## 2022-01-27 DIAGNOSIS — E785 Hyperlipidemia, unspecified: Secondary | ICD-10-CM | POA: Diagnosis not present

## 2022-01-27 DIAGNOSIS — I10 Essential (primary) hypertension: Secondary | ICD-10-CM

## 2022-01-27 DIAGNOSIS — Z96611 Presence of right artificial shoulder joint: Secondary | ICD-10-CM | POA: Diagnosis present

## 2022-01-27 DIAGNOSIS — M1990 Unspecified osteoarthritis, unspecified site: Secondary | ICD-10-CM | POA: Diagnosis present

## 2022-01-27 DIAGNOSIS — R29898 Other symptoms and signs involving the musculoskeletal system: Secondary | ICD-10-CM | POA: Diagnosis not present

## 2022-01-27 DIAGNOSIS — J939 Pneumothorax, unspecified: Secondary | ICD-10-CM | POA: Diagnosis not present

## 2022-01-27 DIAGNOSIS — Z87891 Personal history of nicotine dependence: Secondary | ICD-10-CM | POA: Diagnosis not present

## 2022-01-27 DIAGNOSIS — J90 Pleural effusion, not elsewhere classified: Secondary | ICD-10-CM | POA: Diagnosis not present

## 2022-01-27 DIAGNOSIS — R911 Solitary pulmonary nodule: Secondary | ICD-10-CM | POA: Diagnosis not present

## 2022-01-27 DIAGNOSIS — S2242XS Multiple fractures of ribs, left side, sequela: Secondary | ICD-10-CM

## 2022-01-27 DIAGNOSIS — Y92009 Unspecified place in unspecified non-institutional (private) residence as the place of occurrence of the external cause: Secondary | ICD-10-CM | POA: Diagnosis not present

## 2022-01-27 DIAGNOSIS — S2242XA Multiple fractures of ribs, left side, initial encounter for closed fracture: Secondary | ICD-10-CM | POA: Diagnosis not present

## 2022-01-27 DIAGNOSIS — S270XXA Traumatic pneumothorax, initial encounter: Secondary | ICD-10-CM | POA: Diagnosis present

## 2022-01-27 DIAGNOSIS — J9601 Acute respiratory failure with hypoxia: Secondary | ICD-10-CM

## 2022-01-27 DIAGNOSIS — Z809 Family history of malignant neoplasm, unspecified: Secondary | ICD-10-CM | POA: Diagnosis not present

## 2022-01-27 DIAGNOSIS — S2249XA Multiple fractures of ribs, unspecified side, initial encounter for closed fracture: Secondary | ICD-10-CM | POA: Diagnosis present

## 2022-01-27 DIAGNOSIS — Z7982 Long term (current) use of aspirin: Secondary | ICD-10-CM | POA: Diagnosis not present

## 2022-01-27 DIAGNOSIS — J9811 Atelectasis: Secondary | ICD-10-CM | POA: Diagnosis not present

## 2022-01-27 DIAGNOSIS — Z9841 Cataract extraction status, right eye: Secondary | ICD-10-CM | POA: Diagnosis not present

## 2022-01-27 DIAGNOSIS — R062 Wheezing: Secondary | ICD-10-CM

## 2022-01-27 DIAGNOSIS — Z96641 Presence of right artificial hip joint: Secondary | ICD-10-CM | POA: Diagnosis present

## 2022-01-27 DIAGNOSIS — F32A Depression, unspecified: Secondary | ICD-10-CM | POA: Diagnosis present

## 2022-01-27 DIAGNOSIS — Z85828 Personal history of other malignant neoplasm of skin: Secondary | ICD-10-CM | POA: Diagnosis not present

## 2022-01-27 DIAGNOSIS — F419 Anxiety disorder, unspecified: Secondary | ICD-10-CM | POA: Diagnosis present

## 2022-01-27 DIAGNOSIS — M19012 Primary osteoarthritis, left shoulder: Secondary | ICD-10-CM | POA: Diagnosis not present

## 2022-01-27 DIAGNOSIS — R531 Weakness: Secondary | ICD-10-CM | POA: Diagnosis present

## 2022-01-27 LAB — CBC
HCT: 36.1 % — ABNORMAL LOW (ref 39.0–52.0)
Hemoglobin: 11.8 g/dL — ABNORMAL LOW (ref 13.0–17.0)
MCH: 29.6 pg (ref 26.0–34.0)
MCHC: 32.7 g/dL (ref 30.0–36.0)
MCV: 90.5 fL (ref 80.0–100.0)
Platelets: 151 10*3/uL (ref 150–400)
RBC: 3.99 MIL/uL — ABNORMAL LOW (ref 4.22–5.81)
RDW: 14.3 % (ref 11.5–15.5)
WBC: 13.6 10*3/uL — ABNORMAL HIGH (ref 4.0–10.5)
nRBC: 0 % (ref 0.0–0.2)

## 2022-01-27 LAB — BASIC METABOLIC PANEL
Anion gap: 6 (ref 5–15)
BUN: 24 mg/dL — ABNORMAL HIGH (ref 8–23)
CO2: 30 mmol/L (ref 22–32)
Calcium: 8.8 mg/dL — ABNORMAL LOW (ref 8.9–10.3)
Chloride: 102 mmol/L (ref 98–111)
Creatinine, Ser: 0.71 mg/dL (ref 0.61–1.24)
GFR, Estimated: >60 mL/min (ref >60)
Glucose, Bld: 136 mg/dL — ABNORMAL HIGH (ref 70–99)
Potassium: 3.5 mmol/L (ref 3.5–5.1)
Sodium: 138 mmol/L (ref 135–145)

## 2022-01-27 MED ORDER — METHYLPREDNISOLONE SODIUM SUCC 40 MG IJ SOLR
40.0000 mg | Freq: Every day | INTRAMUSCULAR | Status: DC
  Administered 2022-01-27 – 2022-01-29 (×3): 40 mg via INTRAVENOUS
  Filled 2022-01-27 (×3): qty 1

## 2022-01-27 MED ORDER — GUAIFENESIN 100 MG/5ML PO LIQD
5.0000 mL | ORAL | Status: DC | PRN
  Administered 2022-01-27 – 2022-01-28 (×2): 5 mL via ORAL
  Filled 2022-01-27 (×2): qty 10

## 2022-01-27 MED ORDER — HYDROCOD POLI-CHLORPHE POLI ER 10-8 MG/5ML PO SUER
5.0000 mL | Freq: Two times a day (BID) | ORAL | Status: DC | PRN
  Administered 2022-01-28: 5 mL via ORAL
  Filled 2022-01-27: qty 5

## 2022-01-27 MED ORDER — IPRATROPIUM-ALBUTEROL 0.5-2.5 (3) MG/3ML IN SOLN
3.0000 mL | Freq: Four times a day (QID) | RESPIRATORY_TRACT | Status: DC
Start: 2022-01-27 — End: 2022-01-28
  Administered 2022-01-27 – 2022-01-28 (×5): 3 mL via RESPIRATORY_TRACT
  Filled 2022-01-27 (×5): qty 3

## 2022-01-27 MED ORDER — BUDESONIDE 0.25 MG/2ML IN SUSP
0.2500 mg | Freq: Two times a day (BID) | RESPIRATORY_TRACT | Status: DC
  Administered 2022-01-27 – 2022-01-29 (×5): 0.25 mg via RESPIRATORY_TRACT
  Filled 2022-01-27 (×5): qty 2

## 2022-01-27 MED ORDER — ENOXAPARIN SODIUM 60 MG/0.6ML IJ SOSY
0.5000 mg/kg | PREFILLED_SYRINGE | INTRAMUSCULAR | Status: DC
  Administered 2022-01-27 – 2022-01-28 (×2): 52.5 mg via SUBCUTANEOUS
  Filled 2022-01-27 (×2): qty 0.6

## 2022-01-27 NOTE — Assessment & Plan Note (Signed)
Multiple rib fractures seen on CT scan.  Required IV Dilaudid this morning.  Asked nurse to use oral pain medications also.

## 2022-01-27 NOTE — Progress Notes (Signed)
  Progress Note   Patient: Brian Bean EXB:[id number] DOB: 10/15/1940 DOA: 01/26/2022     0 DOS: the patient was seen and examined on 01/27/2022     Assessment and Plan: * Multiple rib fractures Multiple rib fractures seen on CT scan.  Required IV Dilaudid this morning.  Asked nurse to use oral pain medications also.  Acute respiratory failure with hypoxia (HCC) Pulse ox of 89% on room air.  Patient currently on 2 L.  The patient does not wear oxygen at home so we will try to taper off oxygen.  Patient was also tachypneic and using accessory muscles with shallow breathing secondary to rib fractures.  Pneumothorax Repeat chest x-ray shows small pneumothorax.  Continue to monitor  Wheeze Add Solu-Medrol and nebulizer treatments.  Hyperlipidemia, unspecified Continue atorvastatin  Hypertension Continue Coreg and hydrochlorothiazide  Malignant neoplasm of prostate metastatic to lung [facility]) Will need follow-up as outpatient  CLL (chronic lymphocytic leukemia) ([place]) Follow-up as outpatient        Subjective: Patient with 10 out of 10 pain in his ribs this morning requiring IV Dilaudid.  Patient had a fall on Saturday.  Asking how long the pain for rib fractures last.  Physical Exam: Vitals:   01/26/22 1926 01/27/22 0044 01/27/22 0357 01/27/22 0744  BP: 135/78 (!) 140/86 (!) 158/64 139/78  Pulse: 83 88 82 78  Resp: 17 19 17 18   Temp: 98.7 F (37.1 C) 97.8 F (36.6 C) 98.2 F (36.8 C) 97.9 F (36.6 C)  TempSrc:      SpO2: 91% 91% 95% 95%  Weight:      Height:       Physical Exam HENT:     Head: Normocephalic.     Mouth/Throat:     Pharynx: No oropharyngeal exudate.  Eyes:     General: Lids are normal.     Conjunctiva/sclera: Conjunctivae normal.  Cardiovascular:     Rate and Rhythm: Normal rate and regular rhythm.     Heart sounds: Normal heart sounds, S1 normal and S2 normal.  Pulmonary:     Effort: Accessory muscle usage present.     Breath sounds:  Decreased air movement present. Examination of the right-middle field reveals wheezing. Examination of the left-middle field reveals wheezing. Examination of the right-lower field reveals decreased breath sounds. Examination of the left-lower field reveals decreased breath sounds. Decreased breath sounds and wheezing present. No rhonchi or rales.  Abdominal:     Palpations: Abdomen is soft.     Tenderness: There is no abdominal tenderness.  Musculoskeletal:     Right lower leg: Swelling present.     Left lower leg: Swelling present.  Skin:    General: Skin is warm.     Findings: No rash.  Neurological:     Mental Status: He is alert and oriented to person, place, and time.     Data Reviewed: White blood cell count 13.6, hemoglobin 11.8, platelet count 151, creatinine 0.71  Family Communication:   Disposition: Status is: Inpatient Changed to inpatient because he still on oxygen and he does not wear oxygen at home, using accessory muscles, trying to get better pain control.  Patient also wheezing and starting Solu-Medrol nebulizer treatments and cough medications. Planned Discharge Destination: Home    Time spent: 28 minutes  Author: Loletha Grayer, MD 01/27/2022 11:26 AM  For on call review [email or web address].

## 2022-01-27 NOTE — Evaluation (Signed)
Physical Therapy Evaluation Patient Details Name: Brian Bean MRN: [id number] DOB: Apr 06, 1941 Today's Date: 01/27/2022  History of Present Illness  Pt is a 81 y.o. male presenting to hospital 8/20 with c/o SOB.  Pt had lost his balance and fell at 3 AM while attempting to let the dogs out (fell onto L side and hit his head; immediate onset of L chest wall pain; seen at [facility] walk-in clinic and dx with 4-9 L rib fx's); feeling worse after waking up this morning (c/o weakness, increased L chest wall pain, and difficulty breathing).  Imaging showing small L apical pneumothorax.  Pt admitted with multiple rib fx's, new onset hypoxia, small apical pneumothorax, CLL.  PMH includes htn, CLL, metastatic prostate CA, R reverse shoulder arthroplasty, R THA.  Clinical Impression  PT/OT co-evaluation performed.  Prior to hospital admission, pt was independent with functional mobility (using RW last couple days d/t fall and pain; lives with his significant other in 1 level home with 6 STE B railings).  Pain L ribs 3/10 at rest but 10/10 with coughing.  Currently pt is mod assist semi-supine to sitting edge of bed; CGA with transfers using RW; and CGA to ambulate 20 feet with RW use (2nd assist for line management).  Pt's O2 sats initially 86-89% on 2 L O2 with activity; 89-90% on 3 L O2 with ambulation; pt kept on 3 L O2 end of session at 92% end of session (nurse notified).  Pt would benefit from skilled PT to address noted impairments and functional limitations (see below for any additional details).  Upon hospital discharge, pt would benefit from HHPT and support/assist at home as needed.    Recommendations for follow up therapy are one component of a multi-disciplinary discharge planning process, led by the attending physician.  Recommendations may be updated based on patient status, additional functional criteria and insurance authorization.  Follow Up Recommendations Home health PT      Assistance  Recommended at Discharge Intermittent Supervision/Assistance  Patient can return home with the following  A little help with walking and/or transfers;A little help with bathing/dressing/bathroom;Assistance with cooking/housework;Assist for transportation;Help with stairs or ramp for entrance    Equipment Recommendations Rolling walker (2 wheels);BSC/3in1  Recommendations for Other Services  OT consult    Functional Status Assessment Patient has had a recent decline in their functional status and demonstrates the ability to make significant improvements in function in a reasonable and predictable amount of time.     Precautions / Restrictions Precautions Precautions: Fall Precaution Comments: L rib fx's Restrictions Weight Bearing Restrictions: No      Mobility  Bed Mobility Overal bed mobility: Needs Assistance Bed Mobility: Supine to Sit     Supine to sit: Mod assist, HOB elevated     General bed mobility comments: assist for trunk    Transfers Overall transfer level: Needs assistance Equipment used: Rolling walker (2 wheels) Transfers: Sit to/from Stand, Bed to chair/wheelchair/BSC Sit to Stand: Min guard   Step pivot transfers: Min guard       General transfer comment: x1 trial standing from bed and x1 trial standing from recliner; stand step turn bed to recliner with RW use; vc's for UE/LE placement for transfers; increased effort to stand    Ambulation/Gait Ambulation/Gait assistance: Min guard, +2 safety/equipment (2nd assist for lines) Gait Distance (Feet): 20 Feet Assistive device: Rolling walker (2 wheels)   Gait velocity: decreased     General Gait Details: partial step through gait pattern; SOB noted  Stairs            Wheelchair Mobility    Modified Rankin (Stroke Patients Only)       Balance Overall balance assessment: Needs assistance Sitting-balance support: No upper extremity supported Sitting balance-Leahy Scale: Good Sitting  balance - Comments: steady sitting reaching within BOS   Standing balance support: Bilateral upper extremity supported, During functional activity Standing balance-Leahy Scale: Fair Standing balance comment: steady ambulating with RW                             Pertinent Vitals/Pain Pain Assessment Pain Assessment: 0-10 Pain Score: 3  (3/10 at rest; 10/10 with coughing) Pain Location: L ribs Pain Descriptors / Indicators: Tender, Aching, Constant, Discomfort, Grimacing, Guarding Pain Intervention(s): Limited activity within patient's tolerance, Monitored during session, Repositioned HR in 80's bpm during sessions activities.  Respiratory rate low 20's at rest to 37 with activity.    Home Living Family/patient expects to be discharged to:: Private residence Living Arrangements: Spouse/significant other (Significant other) Available Help at Discharge: Family;Friend(s) Type of Home: House Home Access: Stairs to enter Entrance Stairs-Rails: Right;Left;Can reach both Entrance Stairs-Number of Steps: 6   Home Layout: One level Home Equipment: Grab bars - tub/shower;Rolling Walker (2 wheels)      Prior Function Prior Level of Function : Independent/Modified Independent             Mobility Comments: Using RW last 48 hours but normally walks unassisted.       Hand Dominance        Extremity/Trunk Assessment   Upper Extremity Assessment Upper Extremity Assessment: Defer to OT evaluation (pt not moving L UE much d/t causing L rib pain)    Lower Extremity Assessment Lower Extremity Assessment: Generalized weakness    Cervical / Trunk Assessment Cervical / Trunk Assessment: Normal  Communication   Communication: No difficulties  Cognition Arousal/Alertness: Awake/alert Behavior During Therapy: WFL for tasks assessed/performed Overall Cognitive Status: Within Functional Limits for tasks assessed                                           General Comments  Nursing cleared pt for participation in physical therapy.  Pt agreeable to PT session.    Exercises     Assessment/Plan    PT Assessment Patient needs continued PT services  PT Problem List Decreased strength;Decreased activity tolerance;Decreased balance;Decreased mobility;Decreased knowledge of use of DME;Decreased knowledge of precautions;Pain       PT Treatment Interventions DME instruction;Gait training;Stair training;Functional mobility training;Therapeutic activities;Therapeutic exercise;Balance training;Patient/family education    PT Goals (Current goals can be found in the Care Plan section)  Acute Rehab PT Goals Patient Stated Goal: to improve pain and breathing PT Goal Formulation: With patient Time For Goal Achievement: 02/10/22 Potential to Achieve Goals: Fair    Frequency Min 2X/week     Co-evaluation PT/OT/SLP Co-Evaluation/Treatment: Yes Reason for Co-Treatment: To address functional/ADL transfers PT goals addressed during session: Mobility/safety with mobility;Balance;Proper use of DME OT goals addressed during session: ADL's and self-care       AM-PAC PT "6 Clicks" Mobility  Outcome Measure Help needed turning from your back to your side while in a flat bed without using bedrails?: A Little Help needed moving from lying on your back to sitting on the side of a flat bed without using  bedrails?: A Lot Help needed moving to and from a bed to a chair (including a wheelchair)?: A Little Help needed standing up from a chair using your arms (e.g., wheelchair or bedside chair)?: A Little Help needed to walk in hospital room?: A Little Help needed climbing 3-5 steps with a railing? : A Lot 6 Click Score: 16    End of Session Equipment Utilized During Treatment: Gait belt;Oxygen Activity Tolerance: Patient limited by pain Patient left: in chair;with call bell/phone within reach;with chair alarm set;with family/visitor present;with  nursing/sitter in room Nurse Communication: Mobility status;Precautions;Other (comment) (pt's pain) PT Visit Diagnosis: Other abnormalities of gait and mobility (R26.89);Muscle weakness (generalized) (M62.81);History of falling (Z91.81);Pain    Time: 0940-1003 PT Time Calculation (min) (ACUTE ONLY): 23 min   Charges:   PT Evaluation $PT Eval Low Complexity: 1 Low         Leora Platt, PT 01/27/22, 12:23 PM

## 2022-01-27 NOTE — Progress Notes (Signed)
Anticoagulation monitoring(Lovenox):  80yo  M ordered Lovenox 40 mg Q24h    Filed Weights   01/26/22 1600  Weight: 102.5 kg (225 lb 15.5 oz)   BMI 30.65   Lab Results  Component Value Date   CREATININE 0.71 01/27/2022   CREATININE 0.66 01/26/2022   CREATININE 0.80 12/18/2021   Estimated Creatinine Clearance: 94.3 mL/min (by C-G formula based on SCr of 0.71 mg/dL). Hemoglobin & Hematocrit     Component Value Date/Time   HGB 11.8 (L) 01/27/2022 0519   HGB 14.6 05/09/2021 0936   HGB 14.7 06/24/2016 1013   HCT 36.1 (L) 01/27/2022 0519   HCT 43.8 06/24/2016 1013     Per Protocol for Patient with estCrcl > 30 ml/min and BMI > 30, will transition to Lovenox 0.5 mg/kg Q24h       Chinita Greenland PharmD Clinical Pharmacist 01/27/2022

## 2022-01-27 NOTE — Assessment & Plan Note (Signed)
Wheeze improved.  Better air entry today.  Continue Solu-Medrol and nebulizer treatments.

## 2022-01-27 NOTE — Assessment & Plan Note (Signed)
Continue atorvastatin

## 2022-01-27 NOTE — Assessment & Plan Note (Signed)
Will need follow-up as outpatient

## 2022-01-27 NOTE — Assessment & Plan Note (Signed)
Follow up as outpatient.  

## 2022-01-27 NOTE — Assessment & Plan Note (Signed)
Repeat chest x-ray shows small pneumothorax.  Continue to monitor.

## 2022-01-27 NOTE — Assessment & Plan Note (Signed)
Continue Coreg and hydrochlorothiazide

## 2022-01-27 NOTE — Assessment & Plan Note (Signed)
Pulse ox of 89% on room air.  Patient currently on 2 L.  The patient does not wear oxygen at home so we will try to taper off oxygen.  Patient was also tachypneic and using accessory muscles with shallow breathing secondary to rib fractures.

## 2022-01-27 NOTE — Evaluation (Signed)
Occupational Therapy Evaluation Patient Details Name: Brian Bean MRN: [id number] DOB: 1940-12-27 Today's Date: 01/27/2022   History of Present Illness Pt is a 81 y.o. male presenting to hospital 8/20 with c/o SOB.  Pt had lost his balance and fell at 3 AM while attempting to let the dogs out (fell onto L side and hit his head; immediate onset of L chest wall pain; seen at [facility] walk-in clinic and dx with 4-9 L rib fx's); feeling worse after waking up this morning (c/o weakness, increased L chest wall pain, and difficulty breathing).  Imaging showing small L apical pneumothorax.  Pt admitted with multiple rib fx's, new onset hypoxia, small apical pneumothorax, CLL.  PMH includes htn, CLL, metastatic prostate CA, R reverse shoulder arthroplasty, R THA.   Clinical Impression   Pt seen for OT evaluation and co-tx with PT performed.  Prior to hospital admission, pt was independent with functional mobility (using RW last couple days d/t fall and pain; lives with his significant other in 1 level home with 6 STE B railings).  Pain L ribs 3/10 at rest but 10/10 with coughing. Pt required mod assist semi-supine to sitting edge of bed with PT, CGA with transfers using RW, and CGA to ambulate 20 feet with RW use (2nd assist for line management).  Pt's O2 sats initially 86-89% on 2 L O2 at rest and with bed mobility; 89-90% on 3 L O2 with ambulation; pt kept on 3 L O2 end of session at 92% end of session (nurse notified). Pt requires MIN A for LB ADL tasks primarily due to rib pain with LUE use and bending. Pt educated in PLB during exertion to support improving SpO2 and minimize SOB. Pt would benefit from skilled OT to address noted impairments and functional limitations (see below for any additional details).  Upon hospital discharge, pt would benefit from [facility] and support/assist at home as needed.      Recommendations for follow up therapy are one component of a multi-disciplinary discharge planning  process, led by the attending physician.  Recommendations may be updated based on patient status, additional functional criteria and insurance authorization.   Follow Up Recommendations  Home health OT    Assistance Recommended at Discharge Intermittent Supervision/Assistance  Patient can return home with the following A little help with walking and/or transfers;Assistance with cooking/housework;A little help with bathing/dressing/bathroom;Help with stairs or ramp for entrance;Assist for transportation    Functional Status Assessment  Patient has had a recent decline in their functional status and demonstrates the ability to make significant improvements in function in a reasonable and predictable amount of time.  Equipment Recommendations  Tub/shower seat    Recommendations for Other Services       Precautions / Restrictions Precautions Precautions: Fall Precaution Comments: L rib fx's Restrictions Weight Bearing Restrictions: No      Mobility Bed Mobility Overal bed mobility: Needs Assistance Bed Mobility: Supine to Sit     Supine to sit: Mod assist, HOB elevated     General bed mobility comments: assist for trunk    Transfers Overall transfer level: Needs assistance Equipment used: Rolling walker (2 wheels) Transfers: Sit to/from Stand, Bed to chair/wheelchair/BSC Sit to Stand: Min guard     Step pivot transfers: Min guard, +2 safety/equipment     General transfer comment: x1 trial standing from bed and x1 trial standing from recliner; stand step turn bed to recliner with RW use; vc's for UE/LE placement for transfers; increased effort to stand; +2  for safety/lines mgt      Balance Overall balance assessment: Needs assistance Sitting-balance support: No upper extremity supported Sitting balance-Leahy Scale: Good     Standing balance support: Bilateral upper extremity supported, During functional activity Standing balance-Leahy Scale: Fair                              ADL either performed or assessed with clinical judgement   ADL                                         General ADL Comments: Pt currently requires MIN A For LB ADL tasks 2/2 L rib pain with bending, CGA for ADL transfers with RW     Vision         Perception     Praxis      Pertinent Vitals/Pain Pain Assessment Pain Assessment: 0-10 Pain Score: 10-Worst pain ever Pain Location: L ribs with coughing, 3/10 at rest Pain Descriptors / Indicators: Tender, Aching, Constant, Discomfort, Grimacing, Guarding Pain Intervention(s): Limited activity within patient's tolerance, Monitored during session, Premedicated before session, Repositioned     Hand Dominance     Extremity/Trunk Assessment Upper Extremity Assessment Upper Extremity Assessment:  (pt not moving L UE much d/t causing L rib pain, RUE WFL)   Lower Extremity Assessment Lower Extremity Assessment: Generalized weakness   Cervical / Trunk Assessment Cervical / Trunk Assessment: Normal   Communication Communication Communication: No difficulties   Cognition Arousal/Alertness: Awake/alert Behavior During Therapy: WFL for tasks assessed/performed Overall Cognitive Status: Within Functional Limits for tasks assessed                                       General Comments       Exercises Other Exercises Other Exercises: Pt educated in PLB to support O2/HR during exertion with intermittent VC during session   Shoulder Instructions      Home Living Family/patient expects to be discharged to:: Private residence Living Arrangements: Spouse/significant other (Significant other) Available Help at Discharge: Family;Friend(s) Type of Home: House Home Access: Stairs to enter [employer] of Steps: 6 Entrance Stairs-Rails: Right;Left;Can reach both Home Layout: One level     Bathroom Shower/Tub: Teacher, early years/pre: Handicapped  height     Home Equipment: Grab bars - tub/shower;Rolling Walker (2 wheels)          Prior Functioning/Environment Prior Level of Function : Independent/Modified Independent             Mobility Comments: Using RW last 48 hours but normally walks unassisted.          OT Problem List: Decreased strength;Decreased activity tolerance;Cardiopulmonary status limiting activity;Pain;Impaired balance (sitting and/or standing)      OT Treatment/Interventions: Self-care/ADL training;Therapeutic exercise;Therapeutic activities;Energy conservation;DME and/or AE instruction;Patient/family education;Balance training    OT Goals(Current goals can be found in the care plan section) Acute Rehab OT Goals Patient Stated Goal: get better, have less pain OT Goal Formulation: With patient/family Time For Goal Achievement: 02/10/22 Potential to Achieve Goals: Good ADL Goals Pt Will Perform Upper Body Dressing: with caregiver independent in assisting;sitting Pt Will Perform Lower Body Dressing: sit to/from stand;with adaptive equipment;with supervision Pt Will Transfer to Toilet: with supervision;ambulating (LRAD, elevated commode) Pt  Will Perform Toileting - Clothing Manipulation and hygiene: with modified independence  OT Frequency: Min 2X/week    Co-evaluation PT/OT/SLP Co-Evaluation/Treatment: Yes Reason for Co-Treatment: For patient/therapist safety;To address functional/ADL transfers PT goals addressed during session: Mobility/safety with mobility;Balance;Proper use of DME OT goals addressed during session: ADL's and self-care      AM-PAC OT "6 Clicks" Daily Activity     Outcome Measure Help from another person eating meals?: None Help from another person taking care of personal grooming?: A Little Help from another person toileting, which includes using toliet, bedpan, or urinal?: A Little Help from another person bathing (including washing, rinsing, drying)?: A Little Help from  another person to put on and taking off regular upper body clothing?: A Little Help from another person to put on and taking off regular lower body clothing?: A Little 6 Click Score: 19   End of Session Equipment Utilized During Treatment: Gait belt;Oxygen;Rolling walker (2 wheels) Nurse Communication: Mobility status  Activity Tolerance: Patient tolerated treatment well Patient left: in chair;with call bell/phone within reach;with chair alarm set;with family/visitor present;with nursing/sitter in room  OT Visit Diagnosis: Other abnormalities of gait and mobility (R26.89);History of falling (Z91.81)                Time: 0925-1001 OT Time Calculation (min): 36 min Charges:  OT General Charges $OT Visit: 1 Visit OT Evaluation $OT Eval Moderate Complexity: 1 Mod OT Treatments $Therapeutic Activity: 8-22 mins  Ardeth Perfect., MPH, MS, OTR/L ascom [phone] 01/27/22, 1:35 PM

## 2022-01-28 DIAGNOSIS — S270XXS Traumatic pneumothorax, sequela: Secondary | ICD-10-CM | POA: Diagnosis not present

## 2022-01-28 DIAGNOSIS — R29898 Other symptoms and signs involving the musculoskeletal system: Secondary | ICD-10-CM

## 2022-01-28 DIAGNOSIS — R062 Wheezing: Secondary | ICD-10-CM | POA: Diagnosis not present

## 2022-01-28 DIAGNOSIS — J9601 Acute respiratory failure with hypoxia: Secondary | ICD-10-CM | POA: Diagnosis not present

## 2022-01-28 DIAGNOSIS — S2242XS Multiple fractures of ribs, left side, sequela: Secondary | ICD-10-CM | POA: Diagnosis not present

## 2022-01-28 MED ORDER — ACETAMINOPHEN 650 MG RE SUPP: 650.0000 mg | Freq: Four times a day (QID) | RECTAL | Status: DC | PRN

## 2022-01-28 MED ORDER — ACETAMINOPHEN 325 MG PO TABS
325.0000 mg | ORAL_TABLET | Freq: Four times a day (QID) | ORAL | Status: DC | PRN
  Administered 2022-01-28: 325 mg via ORAL
  Filled 2022-01-28: qty 1

## 2022-01-28 MED ORDER — IPRATROPIUM-ALBUTEROL 0.5-2.5 (3) MG/3ML IN SOLN
3.0000 mL | Freq: Three times a day (TID) | RESPIRATORY_TRACT | Status: DC
  Administered 2022-01-28 – 2022-01-29 (×3): 3 mL via RESPIRATORY_TRACT
  Filled 2022-01-28 (×4): qty 3

## 2022-01-28 NOTE — Hospital Course (Addendum)
81 year old man past medical history of prostate cancer metastatic to lungs, CLL, depression, hypertension, arthritis.  He presents after a fall and found to have 5 rib fractures on CT scan of the chest.  Patient had some wheezing on 01/27/2022 so started on nebulizer treatments and Solu-Medrol.  On 01/28/2022 with ambulation on room air he was hypoxic and placed back on oxygen.

## 2022-01-28 NOTE — Progress Notes (Signed)
Progress Note   Patient: Brian Bean DGU:[id number] DOB: 12-07-1940 DOA: 01/26/2022     1 DOS: the patient was seen and examined on 01/28/2022   Brief hospital course: 81 year old man past medical history of prostate cancer metastatic to lungs, CLL, depression, hypertension, arthritis.  He presents after a fall and found to have 5 rib fractures on CT scan of the chest.  Patient had some wheezing on 01/27/2022 so started on nebulizer treatments and Solu-Medrol.  On 01/28/2022 with ambulation on room air he was hypoxic and placed back on oxygen.  Assessment and Plan: * Multiple rib fractures Multiple rib fractures (5) seen on CT scan.  Pain seems better controlled today.  Continue incentive spirometry, nebulizer treatments.  Continue oral pain medications.  Acute respiratory failure with hypoxia (HCC) Pulse ox of 89% on room air.  Patient at rest able to come off oxygen but with ambulation did desaturate to 82%.  Try again with ambulation tomorrow.  Pneumothorax Repeat chest x-ray shows small pneumothorax.  Continue to monitor.  Repeat x-ray for tomorrow morning.  Wheeze Wheeze improved.  Better air entry today.  Continue Solu-Medrol and nebulizer treatments.  Left arm weakness Continue working with occupational therapy.  Likely trauma from fall and unable to lift his left arm up over his head.  We will get x-ray of the shoulder tomorrow.  Hyperlipidemia, unspecified Continue atorvastatin  Hypertension Continue Coreg and hydrochlorothiazide  Malignant neoplasm of prostate metastatic to lung [facility]) Will need follow-up as outpatient  CLL (chronic lymphocytic leukemia) ([place]) Follow-up as outpatient        Subjective: Patient feeling a little bit better than yesterday.  Breathing a little bit more comfortably.  Still has severe pain.  Still has some cough.  Using incentive spirometer.  Came in with a fall and found to have rib fractures.  Having difficulty lifting his left arm up  over his head.  Physical Exam: Vitals:   01/28/22 1015 01/28/22 1018 01/28/22 1229 01/28/22 1424  BP:  119/65    Pulse:  70  79  Resp:      Temp:      TempSrc:      SpO2: 90% 95% 90% 96%  Weight:      Height:       Physical Exam HENT:     Head: Normocephalic.     Mouth/Throat:     Pharynx: No oropharyngeal exudate.  Eyes:     General: Lids are normal.     Conjunctiva/sclera: Conjunctivae normal.  Cardiovascular:     Rate and Rhythm: Normal rate and regular rhythm.     Heart sounds: Normal heart sounds, S1 normal and S2 normal.  Pulmonary:     Effort: No accessory muscle usage.     Breath sounds: No decreased air movement. Examination of the right-lower field reveals decreased breath sounds. Examination of the left-lower field reveals decreased breath sounds. Decreased breath sounds present. No wheezing, rhonchi or rales.  Abdominal:     Palpations: Abdomen is soft.     Tenderness: There is no abdominal tenderness.  Musculoskeletal:     Right lower leg: Swelling present.     Left lower leg: Swelling present.  Skin:    General: Skin is warm.     Findings: No rash.  Neurological:     Mental Status: He is alert and oriented to person, place, and time.     Data Reviewed: Labs creatinine 0.71, last white count 13.6/hemoglobin 11.8  Family Communication: Spoke with significant other  Disposition: Status is: Inpatient Remains inpatient appropriate because: Unable to come off oxygen today  Planned Discharge Destination: Home    Time spent: 26 minutes  Author: Loletha Grayer, MD 01/28/2022 3:25 PM  For on call review [email or web address].

## 2022-01-28 NOTE — Progress Notes (Signed)
Occupational Therapy Treatment Patient Details Name: Brian Bean MRN: [id number] DOB: 1941-05-16 Today's Date: 01/28/2022   History of present illness Pt is a 81 y.o. male presenting to hospital 8/20 with c/o SOB.  Pt had lost his balance and fell at 3 AM while attempting to let the dogs out (fell onto L side and hit his head; immediate onset of L chest wall pain; seen at [facility] walk-in clinic and dx with 4-9 L rib fx's); feeling worse after waking up this morning (c/o weakness, increased L chest wall pain, and difficulty breathing).  Imaging showing small L apical pneumothorax.  Pt admitted with multiple rib fx's, new onset hypoxia, small apical pneumothorax, CLL.  PMH includes htn, CLL, metastatic prostate CA, R reverse shoulder arthroplasty, R THA.   OT comments  Pt seen ambulated with the RW in the hallway near the nurses station by himself. When approached by OT pt endorses feeling a bit better. SpO2 checked and down to 85-86% on room air standing in hall. Pt encouraged to walk back to his room with OT to ensure SpO2 improved with seated rest break after it did not improve with brief standing rest break. Once sitting, SpO2 up to 87% on RA, 1L O2 via nasal cannula applied and with encouraged to use PLB improved to 92-93%. Pt educated in falls prevention, need for some assist from staff for mobility, IS use with noted improvement in technique with additional instruction. Pt able to return to demo proper technique with improvement in results. RN notified of pt's mobility and O2 sats. Continue to recommend [place] services to support return to PLOF.    Recommendations for follow up therapy are one component of a multi-disciplinary discharge planning process, led by the attending physician.  Recommendations may be updated based on patient status, additional functional criteria and insurance authorization.    Follow Up Recommendations  Home health OT    Assistance Recommended at Discharge  Intermittent Supervision/Assistance  Patient can return home with the following  A little help with walking and/or transfers;Assistance with cooking/housework;A little help with bathing/dressing/bathroom;Help with stairs or ramp for entrance;Assist for transportation   Equipment Recommendations       Recommendations for Other Services      Precautions / Restrictions Precautions Precautions: Fall Precaution Comments: L rib fx's Restrictions Weight Bearing Restrictions: No       Mobility Bed Mobility               General bed mobility comments: NT, up in recliner    Transfers Overall transfer level: Needs assistance Equipment used: Rolling walker (2 wheels) Transfers: Sit to/from Stand Sit to Stand: Supervision           General transfer comment: Pt found ambulating in the hall with RW and no assist. Once returned to room, required supv for transfer to recliner.     Balance Overall balance assessment: Needs assistance Sitting-balance support: No upper extremity supported, Feet supported Sitting balance-Leahy Scale: Good     Standing balance support: Bilateral upper extremity supported, During functional activity Standing balance-Leahy Scale: Fair                             ADL either performed or assessed with clinical judgement   ADL  Extremity/Trunk Assessment              Vision       Perception     Praxis      Cognition Arousal/Alertness: Awake/alert Behavior During Therapy: WFL for tasks assessed/performed Overall Cognitive Status: Within Functional Limits for tasks assessed                                 General Comments: mildly inappropriate, does redirect with cues        Exercises Other Exercises Other Exercises: Pt educated in falls prevention, need for some assist from staff for mobility, IS use with noted improvement in technique with  additional instruction. Pt able to return to demo proper technique    Shoulder Instructions       General Comments      Pertinent Vitals/ Pain       Pain Assessment Pain Assessment: 0-10 Pain Location: 2-3/10 pain at rest, 10/10 L rib pain w/ coughing Pain Descriptors / Indicators: Tender, Aching, Constant, Discomfort, Grimacing, Guarding Pain Intervention(s): Limited activity within patient's tolerance, Monitored during session, Premedicated before session, Repositioned, Patient requesting pain meds-RN notified  Home Living                                          Prior Functioning/Environment              Frequency  Min 2X/week        Progress Toward Goals  OT Goals(current goals can now be found in the care plan section)  Progress towards OT goals: Progressing toward goals  Acute Rehab OT Goals Patient Stated Goal: get better, have less pain OT Goal Formulation: With patient/family Time For Goal Achievement: 02/10/22 Potential to Achieve Goals: Good  Plan Discharge plan remains appropriate;Frequency remains appropriate    Co-evaluation                 AM-PAC OT "6 Clicks" Daily Activity     Outcome Measure   Help from another person eating meals?: None Help from another person taking care of personal grooming?: None Help from another person toileting, which includes using toliet, bedpan, or urinal?: A Little Help from another person bathing (including washing, rinsing, drying)?: A Little Help from another person to put on and taking off regular upper body clothing?: A Little Help from another person to put on and taking off regular lower body clothing?: A Little 6 Click Score: 20    End of Session Equipment Utilized During Treatment: Rolling walker (2 wheels);Oxygen  OT Visit Diagnosis: Other abnormalities of gait and mobility (R26.89);History of falling (Z91.81)   Activity Tolerance Patient tolerated treatment well    Patient Left in chair;with call bell/phone within reach   Nurse Communication Mobility status;Other (comment) (O2 and mobility- RN endorsed plan to go see pt promptly)        Time: 1401-1411 OT Time Calculation (min): 10 min  Charges: OT General Charges $OT Visit: 1 Visit OT Treatments $Self Care/Home Management : 8-22 mins  Ardeth Perfect., MPH, MS, OTR/L ascom [phone] 01/28/22, 3:06 PM

## 2022-01-28 NOTE — Assessment & Plan Note (Addendum)
Continue working with occupational therapy.  Likely trauma from fall and unable to lift his left arm up over his head.  We will get x-ray of the shoulder tomorrow.

## 2022-01-28 NOTE — Progress Notes (Signed)
Mobility Specialist - Progress Note   Pre-mobility: 92 SpO2 (without [place]) During mobility: 82 SpO2 (without [place]) During mobility with O2 88 SpO2 (With 1L [place]) Post-mobility: 94 SPO2 (With 1L [place])   01/28/22 1457  Mobility  Activity Ambulated independently in hallway;Ambulated with assistance in hallway  Level of Assistance Standby assist, set-up cues, supervision of patient - no hands on  Assistive Device Front wheel walker  Distance Ambulated (ft) 160 ft  Activity Response Tolerated well  $Mobility charge 1 Mobility   Pt sitting in recliner on RA upon arrival. Pt STS and ambulates in hallway SBA. Pt ambulates 80 ft without Oxygen but SpO2 drops to 82. 1L added and short rest break to allow SpO2 to rise to 88 before pt walks the second 80 ft. Pt returns to recliner with needs in reach and chair alarm on.   Gretchen Short  Mobility Specialist  01/28/22 3:11 PM

## 2022-01-29 ENCOUNTER — Inpatient Hospital Stay: Payer: Medicare Other

## 2022-01-29 DIAGNOSIS — J9601 Acute respiratory failure with hypoxia: Secondary | ICD-10-CM | POA: Diagnosis not present

## 2022-01-29 DIAGNOSIS — S270XXS Traumatic pneumothorax, sequela: Secondary | ICD-10-CM | POA: Diagnosis not present

## 2022-01-29 DIAGNOSIS — S2242XS Multiple fractures of ribs, left side, sequela: Secondary | ICD-10-CM | POA: Diagnosis not present

## 2022-01-29 MED ORDER — OXYCODONE HCL 5 MG PO TABS
5.0000 mg | ORAL_TABLET | ORAL | 0 refills | Status: DC | PRN
Start: 2022-01-29 — End: 2022-05-26

## 2022-01-29 MED ORDER — STERILE WATER FOR INJECTION IJ SOLN
INTRAMUSCULAR | Status: AC
  Administered 2022-01-29: 10 mL
  Filled 2022-01-29: qty 10

## 2022-01-29 NOTE — Progress Notes (Signed)
Physical Therapy Treatment Patient Details Name: Brian Bean MRN: [id number] DOB: 05/10/1941 Today's Date: 01/29/2022   History of Present Illness Pt is a 81 y.o. male presenting to hospital 8/20 with c/o SOB.  Pt had lost his balance and fell at 3 AM while attempting to let the dogs out (fell onto L side and hit his head; immediate onset of L chest wall pain; seen at [facility] walk-in clinic and dx with 4-9 L rib fx's); feeling worse after waking up this morning (c/o weakness, increased L chest wall pain, and difficulty breathing).  Imaging showing small L apical pneumothorax.  Pt admitted with multiple rib fx's, new onset hypoxia, small apical pneumothorax, CLL.  PMH includes htn, CLL, metastatic prostate CA, R reverse shoulder arthroplasty, R THA.    PT Comments    Patient received in bed, pleasant, cooperative. Agrees to PT session. Reports he is going home today. Received on room air with sats at 90% at rest. He performed bed mobility with min A due to L rib pain,  transfers with mod I. Ambulated 150 feet with RW and supervision. No lob. O2 sats down to 86% with ambulating on room air. RN notified. He will benefit from continued skilled PT to improve endurance and safety.    Recommendations for follow up therapy are one component of a multi-disciplinary discharge planning process, led by the attending physician.  Recommendations may be updated based on patient status, additional functional criteria and insurance authorization.  Follow Up Recommendations  Home health PT     Assistance Recommended at Discharge Intermittent Supervision/Assistance  Patient can return home with the following A little help with bathing/dressing/bathroom;A little help with walking and/or transfers;Help with stairs or ramp for entrance;Assistance with cooking/housework;Assist for transportation   Equipment Recommendations  None recommended by PT    Recommendations for Other Services       Precautions /  Restrictions Precautions Precautions: Fall Precaution Comments: L rib fx's Restrictions Weight Bearing Restrictions: No     Mobility  Bed Mobility Overal bed mobility: Needs Assistance Bed Mobility: Supine to Sit     Supine to sit: Min assist, HOB elevated     General bed mobility comments: Needs min A to bring legs off edge of bed, bed rail assist to get sitting up    Transfers Overall transfer level: Modified independent Equipment used: Rolling walker (2 wheels) Transfers: Sit to/from Stand Sit to Stand: Modified independent (Device/Increase time)                Ambulation/Gait Ambulation/Gait assistance: Min guard, Supervision Gait Distance (Feet): 150 Feet Assistive device: Rolling walker (2 wheels) Gait Pattern/deviations: Step-through pattern Gait velocity: WFL     General Gait Details: patient moving well, O2 sats dropped from 90% to 86% with ambulation on room air. Able to bring sats up to 92% with PLB and seated rest.   Stairs             Wheelchair Mobility    Modified Rankin (Stroke Patients Only)       Balance Overall balance assessment: Modified Independent Sitting-balance support: Feet supported Sitting balance-Leahy Scale: Normal     Standing balance support: Bilateral upper extremity supported, During functional activity Standing balance-Leahy Scale: Good Standing balance comment: steady ambulating with RW                            Cognition Arousal/Alertness: Awake/alert Behavior During Therapy: WFL for tasks assessed/performed Overall Cognitive  Status: Within Functional Limits for tasks assessed                                          Exercises      General Comments        Pertinent Vitals/Pain Pain Assessment Pain Assessment: Faces Faces Pain Scale: Hurts a little bit Pain Location: pain with moving from supine to sit Pain Descriptors / Indicators: Tender, Discomfort, Sharp Pain  Intervention(s): Monitored during session, Repositioned    Home Living                          Prior Function            PT Goals (current goals can now be found in the care plan section) Acute Rehab PT Goals Patient Stated Goal: to improve pain and breathing PT Goal Formulation: With patient Time For Goal Achievement: 02/10/22 Potential to Achieve Goals: Good Progress towards PT goals: Progressing toward goals    Frequency    Min 2X/week      PT Plan Current plan remains appropriate    Co-evaluation              AM-PAC PT "6 Clicks" Mobility   Outcome Measure  Help needed turning from your back to your side while in a flat bed without using bedrails?: A Little Help needed moving from lying on your back to sitting on the side of a flat bed without using bedrails?: A Little Help needed moving to and from a bed to a chair (including a wheelchair)?: A Little Help needed standing up from a chair using your arms (e.g., wheelchair or bedside chair)?: A Little Help needed to walk in hospital room?: A Little Help needed climbing 3-5 steps with a railing? : A Little 6 Click Score: 18    End of Session Equipment Utilized During Treatment: Gait belt Activity Tolerance: Patient tolerated treatment well Patient left: in chair;with call bell/phone within reach;with chair alarm set Nurse Communication: Mobility status;Other (comment) (need for home O2) PT Visit Diagnosis: Other abnormalities of gait and mobility (R26.89);Muscle weakness (generalized) (M62.81);History of falling (Z91.81);Pain Pain - Right/Left: Left Pain - part of body:  (ribs)     Time: 1000-1014 PT Time Calculation (min) (ACUTE ONLY): 14 min  Charges:  $Gait Training: 8-22 mins                     Brian Bean, PT, GCS 01/29/22,10:28 AM

## 2022-01-29 NOTE — Progress Notes (Signed)
Mobility Specialist - Progress Note  Pre-mobility: 91 SpO2 During mobility: 86 SpO2 Post-mobility: 88 SPO2   01/29/22 0800  Mobility  Activity Ambulated independently in hallway;Stood at bedside;Dangled on edge of bed  Level of Assistance Standby assist, set-up cues, supervision of patient - no hands on  Assistive Device Front wheel walker  Distance Ambulated (ft) 240 ft  Activity Response Tolerated well  $Mobility charge 1 Mobility   Pt semi-supine in bed on RA upon arrival. Pt scoots to EOB SBA with extra time and increasing head of bed height. Pt STS and ambulates in hallway SBA. (No hands on). Pt does not utilize [place] during walk but takes 2 standing rest breaks. Pt returns to recliner with needs in reach and chair alarm on.   Brian Bean  Mobility Specialist  01/29/22 8:59 AM

## 2022-01-29 NOTE — TOC Initial Note (Signed)
Transition of Care [facility]) - Initial/Assessment Note    Patient Details  Name: Brian Bean MRN: [id number] Date of Birth: 1940-11-01  [facility] CM/SW Contact:    Pete Pelt, RN Phone Number: 01/29/2022, 3:32 PM  Clinical Narrative:      Patient lives at home with spouse.  Patient was initially apprehensive about going home with home health.  He asked to speak with therapy supervisor, K. Brown in to see patient.  Patient is amenable to going home with home health at this time.  Patient has DME at home.  Spouse assists with care and transportation.       Expected Discharge Plan: [place] Barriers to Discharge: Continued Medical Work up   Patient Goals and CMS Choice        Expected Discharge Plan and Services Expected Discharge Plan: [place]   Discharge Planning Services: CM Consult Post Acute Care Choice: Home Health   Expected Discharge Date: 01/29/22                             Date HH Agency Contacted: 01/29/22 Time HH Agency Contacted: 1941    Prior Living Arrangements/Services   Lives with:: Self, Spouse Patient language and need for interpreter reviewed:: Yes Do you feel safe going back to the place where you live?: Yes      Need for Family Participation in Patient Care: Yes (Comment) Care giver support system in place?: Yes (comment)   Criminal Activity/Legal Involvement Pertinent to Current Situation/Hospitalization: No - Comment as needed  Activities of Daily Living Home Assistive Devices/Equipment: None, Walker (specify type) ADL Screening (condition at time of admission) Patient's cognitive ability adequate to safely complete daily activities?: Yes Is the patient deaf or have difficulty hearing?: No Does the patient have difficulty seeing, even when wearing glasses/contacts?: No Does the patient have difficulty concentrating, remembering, or making decisions?: No Patient able to express  need for assistance with ADLs?: Yes Does the patient have difficulty dressing or bathing?: Yes Independently performs ADLs?: No Communication: Needs assistance Is this a change from baseline?: Change from baseline, expected to last <3 days Dressing (OT): Needs assistance Is this a change from baseline?: Change from baseline, expected to last <3days Feeding: Independent Is this a change from baseline?: Pre-admission baseline Bathing: Needs assistance Is this a change from baseline?: Change from baseline, expected to last <3 days Toileting: Needs assistance Is this a change from baseline?: Change from baseline, expected to last <3 days In/Out Bed: Needs assistance Is this a change from baseline?: Change from baseline, expected to last <3 days Walks in Home: Needs assistance Is this a change from baseline?: Change from baseline, expected to last <3 days Does the patient have difficulty walking or climbing stairs?: No Weakness of Legs: Both Weakness of Arms/Hands: Both  Permission Sought/Granted Permission sought to share information with : Case Manager Permission granted to share information with : Yes, Verbal Permission Granted     Permission granted to share info w AGENCY: home heatlh        Emotional Assessment Appearance:: Appears stated age Attitude/Demeanor/Rapport: Apprehensive Affect (typically observed): Apprehensive Orientation: : Oriented to Self, Oriented to Place, Oriented to  Time, Oriented to Situation Alcohol / Substance Use: Not Applicable Psych Involvement: No (comment)  Admission diagnosis:  Multiple rib fractures [S22.49XA] Traumatic pneumothorax, initial encounter [S27.0XXA] Closed fracture of multiple ribs of left side, initial encounter [  S22.42XA] Patient Active Problem List   Diagnosis Date Noted   Left arm weakness 01/28/2022   Pneumothorax 01/27/2022   Wheeze 01/27/2022   Hyperlipidemia, unspecified 01/27/2022   Acute respiratory failure with  hypoxia (HCC) 01/27/2022   Multiple rib fractures 01/26/2022   Arthritis 01/26/2022   Hypertension 01/26/2022   Goals of care, counseling/discussion 10/08/2021   Malignant neoplasm of prostate metastatic to lung ([place]) 03/08/2021   Lung nodules 03/08/2021   History of skin cancer 08/20/2017   Overweight (BMI 25.0-29.9) 08/20/2017   Prostate cancer ([place]) 06/24/2016   Lymphadenopathy, abdominal 06/24/2016   Thrombocytopenia ([place]) 06/26/2014   CLL (chronic lymphocytic leukemia) ([place]) 01/06/2013   Avascular necrosis of right hip 05/05/2012   PCP:  Lajean Manes, MD Pharmacy:   [id number] [place], [place] [address] AT [address] [place] [address] Phone: [phone] Fax: [phone]     Social Determinants of Health (SDOH) Interventions    Readmission Risk Interventions     No data to display

## 2022-01-29 NOTE — Progress Notes (Signed)
PT Contact Note  To room to speak with patient at the request of patient and [facility] colleague.  Patient voicing some concern over his abilities and level of assist required in home; fearful of falling and significant other not being able to assist in recovery.  Provided active listening and reviewed course of therapy, including mobility progression and current discharge recommendations. Patient initially requesting consideration for rehab, but after reviewing and educating patient on options and implications (home with HH/outpatient or rehab), patient ultimately voiced preference for transition home with [facility] services. Patient made quick phone call to significant other, Zigmund Daniel, who agreed with patient and also voiced preference for transition home.  Confirmed that patient has equipment recommended (RW) and confirmed patient's agreement with HHPT, [place], [place] and [facility].  Reviewed safety needs/precautions and encouraged patient to communicate any specific mobility needs/goals post-discharge to home therapy team as needed. Patient voiced understanding/agreement.  Patient thanked therapist for time and for assistance.  Therapist number provided for any further questions or assistance.  Ronny Ruddell H. Owens Shark, PT, DPT, NCS 01/29/22, 4:49 PM [phone]

## 2022-01-29 NOTE — Progress Notes (Signed)
SATURATION QUALIFICATIONS: (This note is used to comply with regulatory documentation for home oxygen)  Patient Saturations on Room Air at Rest = 94%  Patient Saturations on Room Air while Ambulating = 89%  Patient Saturations on 2 Liters of oxygen while Ambulating = 92%  Please briefly explain why patient needs home oxygen: Patient stayed between 92-96% on 2 liters of oxygen while ambulating.

## 2022-01-29 NOTE — Discharge Summary (Signed)
Physician Discharge Summary   Patient: Brian Bean MRN: [id number] DOB: 26-Jul-1940  Admit date:     01/26/2022  Discharge date: 01/29/22  Discharge Physician: Sharen Hones   PCP: Lajean Manes, MD   Recommendations at discharge:   Follow-up with PCP in 1 week.  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 *  Hospital Course: 81 year old man past medical history of prostate cancer metastatic to lungs, CLL, depression, hypertension, arthritis.  He presents after a fall and found to have 5 rib fractures on CT scan of the chest.  Patient had some wheezing on 01/27/2022 so started on nebulizer treatments and Solu-Medrol.  On 01/28/2022 with ambulation on room air he was hypoxic and placed back on oxygen.  Assessment and Plan: * Multiple rib fractures Multiple rib fractures (5) seen on CT scan.  Patient was given pain medicine, condition improving.  At this point, continue as needed Percocet.  Patient had a minimal requirement over the last 24 hours.  Repeated chest x-ray today appears to be stable.   Acute respiratory failure with hypoxia (HCC) This is secondary to rib fracture.  Patient condition had improved, he is off oxygen now with good saturation.  Pneumothorax Secondary to rib fractures, condition improved.  Repeated chest x-ray today did not show any pneumothorax.  Patient is medically stable to be discharged.   Wheeze Condition has improved, patient has no respite symptoms today.  Discontinue steroids.   Left arm weakness X-ray did not show any shoulder fracture.   Hyperlipidemia, unspecified Continue atorvastatin    Hypertension Continue Coreg and hydrochlorothiazide   Malignant neoplasm of prostate metastatic to lung [facility]) Patient be followed by PCP as outpatient.   CLL (chronic lymphocytic leukemia) ([place]) Follow-up as outpatient       Consultants: None Procedures performed: None  Disposition: Home Diet recommendation:  Discharge Diet Orders (From admission, onward)     Start     Ordered   01/29/22 0000  Diet - low sodium heart healthy        01/29/22 0948           Cardiac diet DISCHARGE MEDICATION: Allergies as of 01/29/2022   No Known Allergies      Medication List     STOP taking these medications    dexamethasone 4 MG tablet Commonly known as: DECADRON   HYDROcodone-acetaminophen 5-325 MG tablet Commonly known as: NORCO/VICODIN   ondansetron 8 MG tablet Commonly known as: Zofran   prochlorperazine 10 MG tablet Commonly known as: COMPAZINE       TAKE these medications    amLODipine 5 MG tablet Commonly known as: NORVASC Take 5 mg by mouth 2 (two) times daily.   aspirin EC 81 MG tablet Take 81 mg by mouth daily. Swallow whole.   atorvastatin 20 MG tablet Commonly known as: LIPITOR Take 20 mg by mouth daily.   carvedilol 12.5 MG tablet Commonly known as: COREG Take 12.5 mg by mouth 2 (two) times daily with a meal.   Flonase Allergy Relief 50 MCG/ACT nasal spray Generic drug: fluticasone Place 1 spray into both nostrils daily as needed for allergies or rhinitis.   fluorouracil 5 %  cream Commonly known as: EFUDEX Apply 1 application topically 2 (two) times daily as needed (precancerous spots).   FLUoxetine 40 MG capsule Commonly known as: PROZAC Take 80 mg by mouth daily.   hydrochlorothiazide 25 MG tablet Commonly known as: HYDRODIURIL Take 25 mg by mouth daily.   IPRATROPIUM BROMIDE IN Inhale into the lungs as needed.   ketoconazole 2 % shampoo Commonly known as: NIZORAL Apply 1 application topically daily as needed for  irritation.   lisinopril 10 MG tablet Commonly known as: ZESTRIL Take 10 mg by mouth daily.   montelukast 10 MG tablet Commonly known as: Singulair Take 1 tablet (10 mg total) by mouth See admin instructions. Take 1 tablet daily. Start the day before Taxotere. Then daily after chemo for 2 days   naproxen sodium 220 MG tablet Commonly known as: ALEVE Take 220 mg by mouth daily as needed (pain).   OLANZapine 2.5 MG tablet Commonly known as: ZYPREXA Take 2.5 mg by mouth at bedtime.   oxyCODONE 5 MG immediate release tablet Commonly known as: Oxy IR/ROXICODONE Take 1 tablet (5 mg total) by mouth every 4 (four) hours as needed for moderate pain.   tadalafil 20 MG tablet Commonly known as: CIALIS Take 20 mg by mouth daily as needed for erectile dysfunction.        Follow-up Information     Stoneking, Hal, MD Follow up in 1 week(s).   Specialty: Internal Medicine Contact information: [address] [place] [address] [phone]                Discharge Exam: Danley Danker Weights   01/26/22 1600  Weight: 102.5 kg   General exam: Appears calm and comfortable  Respiratory system: Clear to auscultation. Respiratory effort normal. Cardiovascular system: S1 & S2 heard, RRR. No JVD, murmurs, rubs, gallops or clicks. No pedal edema. Gastrointestinal system: Abdomen is nondistended, soft and nontender. No organomegaly or masses felt. Normal bowel sounds heard. Central nervous system: Alert and oriented. No focal neurological deficits. Extremities: Symmetric 5 x 5 power. Skin: No rashes, lesions or ulcers Psychiatry: Judgement and insight appear normal. Mood & affect appropriate.    Condition at discharge: good  The results of significant diagnostics from this hospitalization (including imaging, microbiology, ancillary and laboratory) are listed below for reference.   Imaging Studies: DG Shoulder Left  Result Date: 01/29/2022 CLINICAL DATA:  Left  shoulder pain after fall. EXAM: LEFT SHOULDER - 2+ VIEW COMPARISON:  January 26, 2022. FINDINGS: Old left clavicular fracture is noted. Multiple left rib fractures are noted as described on prior CT scan. Moderate degenerative changes seen involving the left acromioclavicular joint. Glenohumeral joint is unremarkable. IMPRESSION: Multiple acute left rib fractures as noted on prior CT scan. No other acute abnormality seen. Electronically Signed   By: Marijo Conception M.D.   On: 01/29/2022 08:21   DG Chest Port 1 View  Result Date: 01/29/2022 CLINICAL DATA:  Follow-up pneumothorax EXAM: PORTABLE CHEST 1 VIEW COMPARISON:  Chest x-ray dated January 27, 2022 FINDINGS: Cardiac and mediastinal contours are unchanged. Small left pleural effusion. Left basilar atelectasis. No visible pneumothorax. Multiple left-sided rib fractures. IMPRESSION: Small left pleural effusion.  No visible pneumothorax. Electronically Signed   By: Yetta Glassman M.D.   On: 01/29/2022 08:20   X-ray chest PA and lateral  Result Date: 01/27/2022 CLINICAL DATA:  Follow-up pneumothorax EXAM: CHEST - 2 VIEW COMPARISON:  Yesterday FINDINGS: Small left apical pneumothorax. Opacification at the left base which is  pleural fluid and atelectasis by CT. Extensive posttraumatic distortion of the left ribs. Remote left clavicle and scapular fracture. Cardiomegaly and vascular pedicle widening. Pulmonary nodules in the right lung are not well redemonstrated. IMPRESSION: 1. Small left apical pneumothorax. 2. Unchanged pleural fluid and atelectasis at the left base. Electronically Signed   By: Jorje Guild M.D.   On: 01/27/2022 06:58   CT Head Wo Contrast  Result Date: 01/26/2022 CLINICAL DATA:  Status post fall, shortness of breath, multiple broken ribs EXAM: CT HEAD WITHOUT CONTRAST CT CERVICAL SPINE WITHOUT CONTRAST TECHNIQUE: Multidetector CT imaging of the head and cervical spine was performed following the standard protocol without intravenous  contrast. Multiplanar CT image reconstructions of the cervical spine were also generated. RADIATION DOSE REDUCTION: This exam was performed according to the departmental dose-optimization program which includes automated exposure control, adjustment of the mA and/or kV according to patient size and/or use of iterative reconstruction technique. COMPARISON:  None Available. FINDINGS: CT HEAD FINDINGS Brain: No evidence of acute infarction, hemorrhage, extra-axial collection, ventriculomegaly, or mass effect. Generalized cerebral atrophy. Periventricular white matter low attenuation likely secondary to microangiopathy. Vascular: Cerebrovascular atherosclerotic calcifications are noted. No hyperdense vessels. Skull: Negative for fracture or focal lesion. Sinuses/Orbits: Visualized portions of the orbits are unremarkable. Visualized portions of the paranasal sinuses are unremarkable. Visualized portions of the mastoid air cells are unremarkable. Other: None. CT CERVICAL SPINE FINDINGS Alignment: Normal. Skull base and vertebrae: No acute fracture. No primary bone lesion or focal pathologic process. Old spinous process avulsion fractures. Soft tissues and spinal canal: No prevertebral fluid or swelling. No visible canal hematoma. Disc levels: Anterior cervical fusion at C5-6 without hardware failure or complication and solid osseous bridging across the disc space. Developmental fusion of the C2-3 vertebral bodies and posterior elements. At C3-4 there is degenerative disease with disc height loss, broad-based disc osteophyte complex, bilateral uncovertebral degenerative changes, bilateral facet arthropathy, and bilateral foraminal stenosis. At C4-5 there is a broad-based disc osteophyte complex, bilateral uncovertebral degenerative changes, bilateral facet arthropathy and bilateral foraminal stenosis. At C5-6 there is interbody fusion, mild bilateral foraminal stenosis and bilateral mild facet arthropathy. At C6-7 there is  disc height loss, bilateral uncovertebral degenerative changes, and mild bilateral foraminal stenosis. Upper chest: 9 mm left apical pulmonary nodule. Trace left apical pneumothorax. Other: Bilateral carotid artery atherosclerosis. IMPRESSION: 1. No acute intracranial pathology. 2.  No acute osseous injury of the cervical spine. 3. Cervical spine spondylosis as described above. 4. Trace left apical pneumothorax. Electronically Signed   By: Kathreen Devoid M.D.   On: 01/26/2022 15:12   CT Cervical Spine Wo Contrast  Result Date: 01/26/2022 CLINICAL DATA:  Status post fall, shortness of breath, multiple broken ribs EXAM: CT HEAD WITHOUT CONTRAST CT CERVICAL SPINE WITHOUT CONTRAST TECHNIQUE: Multidetector CT imaging of the head and cervical spine was performed following the standard protocol without intravenous contrast. Multiplanar CT image reconstructions of the cervical spine were also generated. RADIATION DOSE REDUCTION: This exam was performed according to the departmental dose-optimization program which includes automated exposure control, adjustment of the mA and/or kV according to patient size and/or use of iterative reconstruction technique. COMPARISON:  None Available. FINDINGS: CT HEAD FINDINGS Brain: No evidence of acute infarction, hemorrhage, extra-axial collection, ventriculomegaly, or mass effect. Generalized cerebral atrophy. Periventricular white matter low attenuation likely secondary to microangiopathy. Vascular: Cerebrovascular atherosclerotic calcifications are noted. No hyperdense vessels. Skull: Negative for fracture or focal lesion. Sinuses/Orbits: Visualized portions of the orbits are unremarkable. Visualized portions of  the paranasal sinuses are unremarkable. Visualized portions of the mastoid air cells are unremarkable. Other: None. CT CERVICAL SPINE FINDINGS Alignment: Normal. Skull base and vertebrae: No acute fracture. No primary bone lesion or focal pathologic process. Old spinous  process avulsion fractures. Soft tissues and spinal canal: No prevertebral fluid or swelling. No visible canal hematoma. Disc levels: Anterior cervical fusion at C5-6 without hardware failure or complication and solid osseous bridging across the disc space. Developmental fusion of the C2-3 vertebral bodies and posterior elements. At C3-4 there is degenerative disease with disc height loss, broad-based disc osteophyte complex, bilateral uncovertebral degenerative changes, bilateral facet arthropathy, and bilateral foraminal stenosis. At C4-5 there is a broad-based disc osteophyte complex, bilateral uncovertebral degenerative changes, bilateral facet arthropathy and bilateral foraminal stenosis. At C5-6 there is interbody fusion, mild bilateral foraminal stenosis and bilateral mild facet arthropathy. At C6-7 there is disc height loss, bilateral uncovertebral degenerative changes, and mild bilateral foraminal stenosis. Upper chest: 9 mm left apical pulmonary nodule. Trace left apical pneumothorax. Other: Bilateral carotid artery atherosclerosis. IMPRESSION: 1. No acute intracranial pathology. 2.  No acute osseous injury of the cervical spine. 3. Cervical spine spondylosis as described above. 4. Trace left apical pneumothorax. Electronically Signed   By: Kathreen Devoid M.D.   On: 01/26/2022 15:12   CT CHEST ABDOMEN PELVIS W CONTRAST  Result Date: 01/26/2022 CLINICAL DATA:  Blunt poly trauma, status post fall yesterday. EXAM: CT CHEST, ABDOMEN, AND PELVIS WITH CONTRAST TECHNIQUE: Multidetector CT imaging of the chest, abdomen and pelvis was performed following the standard protocol during bolus administration of intravenous contrast. RADIATION DOSE REDUCTION: This exam was performed according to the departmental dose-optimization program which includes automated exposure control, adjustment of the mA and/or kV according to patient size and/or use of iterative reconstruction technique. CONTRAST:  152mL OMNIPAQUE IOHEXOL  300 MG/ML  SOLN COMPARISON:  PET-CT 10/23/2021, CT chest 11/07/2021 FINDINGS: CT CHEST FINDINGS Cardiovascular: Stable cardiomegaly. No pericardial effusion. Thoracic aortic atherosclerosis. Coronary artery atherosclerosis. Thoracic aorta is normal in caliber. Mediastinum/Nodes: Multiple small subcentimeter mediastinal lymph nodes. No axillary or hilar lymphadenopathy. Trachea is unremarkable. Thyroid gland is unremarkable. Fluid-filled esophagus as can be seen with gastroesophageal reflux. Lungs/Pleura: Small left pleural effusion with left basilar atelectasis. Scattered bilateral pulmonary nodules again noted similar in appearance to the prior examination of 11/07/2021. Largest left upper lobe pulmonary nodule measures 7 mm. Largest right upper lobe pulmonary nodule measures 11 mm. Largest left lower lobe pulmonary nodule in the superior segment measures 12 mm. Small left pneumothorax. Musculoskeletal: Nondisplaced fracture of the left posterolateral fourth rib. Nondisplaced fracture of the left posterolateral fifth rib. Minimally displaced fracture of the left posterior sixth rib. Minimally displaced fracture of the left lateral fourth rib. Comminuted fracture of the left posterior seventh rib. Nondisplaced fracture of the left posterior eighth rib. Nondisplaced fracture of the left posterior ninth rib. CT ABDOMEN PELVIS FINDINGS Hepatobiliary: No focal liver abnormality is seen. No gallstones, gallbladder wall thickening, or biliary dilatation. Pancreas: Unremarkable. No pancreatic ductal dilatation or surrounding inflammatory changes. Spleen: Normal in size without focal abnormality. Adrenals/Urinary Tract: Adrenal glands are unremarkable. Kidneys are normal, without renal calculi, focal lesion, or hydronephrosis. Left parapelvic cyst. Bladder is unremarkable. Stomach/Bowel: Stomach is within normal limits. Diverticulosis without evidence of diverticulitis. Large amount of stool in the ascending colon. No  evidence of bowel wall thickening, distention, or inflammatory changes. Vascular/Lymphatic: No significant vascular findings are present. No enlarged abdominal or pelvic lymph nodes. Reproductive: Prostate is unremarkable. Other: Fat  containing left inguinal hernia.  No ascites. Musculoskeletal: Dextrocurvature of the lumbar spine. Bilateral facet arthropathy throughout the lumbar spine. Right total hip arthroplasty. Minimal osteoarthritis of the left hip. No aggressive osseous lesion. IMPRESSION: 1. Multiple acute left-sided rib fractures as described above. Small left pneumothorax. 2. Small left pleural effusion with left basilar atelectasis. 3. No acute injury of the abdomen or pelvis. 4. Diverticulosis without evidence of diverticulitis. 5. Multiple bilateral pulmonary nodules unchanged compared with most recent CT of the chest dated 11/07/2021 consistent with stable metastatic disease. 6. Aortic Atherosclerosis (ICD10-I70.0). Coronary artery atherosclerosis. Electronically Signed   By: Kathreen Devoid M.D.   On: 01/26/2022 15:07    Microbiology: Results for orders placed or performed during the hospital encounter of 04/16/21  SARS Coronavirus 2 by RT PCR (hospital order, performed in [facility] hospital lab) Nasopharyngeal Nasopharyngeal Swab     Status: None   Collection Time: 04/16/21  8:23 AM   Specimen: Nasopharyngeal Swab  Result Value Ref Range Status   SARS Coronavirus 2 NEGATIVE NEGATIVE Final    Comment: (NOTE) SARS-CoV-2 target nucleic acids are NOT DETECTED.  The SARS-CoV-2 RNA is generally detectable in upper and lower respiratory specimens during the acute phase of infection. The lowest concentration of SARS-CoV-2 viral copies this assay can detect is 250 copies / mL. A negative result does not preclude SARS-CoV-2 infection and should not be used as the sole basis for treatment or other patient management decisions.  A negative result may occur with improper specimen collection /  handling, submission of specimen other than nasopharyngeal swab, presence of viral mutation(s) within the areas targeted by this assay, and inadequate number of viral copies (<250 copies / mL). A negative result must be combined with clinical observations, patient history, and epidemiological information.  Fact Sheet for Patients:   StrictlyIdeas.no  Fact Sheet for Healthcare Providers: BankingDealers.co.za  This test is not yet approved or  cleared by the [place] FDA and has been authorized for detection and/or diagnosis of SARS-CoV-2 by FDA under an Emergency Use Authorization (EUA).  This EUA will remain in effect (meaning this test can be used) for the duration of the COVID-19 declaration under Section 564(b)(1) of the Act, 21 U.S.C. section 360bbb-3(b)(1), unless the authorization is terminated or revoked sooner.  Performed at [facility], [place] [address] [place], [place] [address]     Labs: CBC: Recent Labs  Lab 01/26/22 1356 01/27/22 0519  WBC 16.8*  16.6* 13.6*  NEUTROABS 9.5*  --   HGB 13.4  13.2 11.8*  HCT 41.1  41.1 36.1*  MCV 90.1  89.9 90.5  PLT 194  173 176   Basic Metabolic Panel: Recent Labs  Lab 01/26/22 1356 01/27/22 0519  NA 139 138  K 3.5 3.5  CL 101 102  CO2 28 30  GLUCOSE 144* 136*  BUN 24* 24*  CREATININE 0.66 0.71  CALCIUM 9.4 8.8*   Liver Function Tests: Recent Labs  Lab 01/26/22 1356  AST 20  ALT 21  ALKPHOS 49  BILITOT 1.2  PROT 7.0  ALBUMIN 4.2   CBG: No results for input(s): "GLUCAP" in the last 168 hours.  Discharge time spent: greater than 30 minutes.  Signed: Sharen Hones, MD [facility] 01/29/2022

## 2022-01-29 NOTE — Progress Notes (Signed)
SATURATION QUALIFICATIONS: (This note is used to comply with regulatory documentation for home oxygen)  Patient Saturations on Room Air at Rest = 94%  Patient Saturations on Room Air while Ambulating = 85%  Patient Saturations on 2 Liters of oxygen while Ambulating = 93-95%  Please briefly explain why patient needs home oxygen: With exertion on room air patient dropped to 85%.

## 2022-01-29 NOTE — Progress Notes (Signed)
Patient discharge paperwork in this morning, peripheral IV removed and discharge instructions provided around 11:40 AM. Patient been awaiting oxygen delivery. Patient walked out of his room and left without his oxygen around 5pm. MD made aware. MD request to send oxygen to home. TOC made aware.

## 2022-02-02 ENCOUNTER — Other Ambulatory Visit: Payer: Self-pay

## 2022-02-04 DIAGNOSIS — I700 Atherosclerosis of aorta: Secondary | ICD-10-CM | POA: Diagnosis not present

## 2022-02-04 DIAGNOSIS — S2242XA Multiple fractures of ribs, left side, initial encounter for closed fracture: Secondary | ICD-10-CM | POA: Diagnosis not present

## 2022-02-05 DIAGNOSIS — R0602 Shortness of breath: Secondary | ICD-10-CM | POA: Diagnosis not present

## 2022-02-05 DIAGNOSIS — J90 Pleural effusion, not elsewhere classified: Secondary | ICD-10-CM | POA: Diagnosis not present

## 2022-02-06 ENCOUNTER — Other Ambulatory Visit: Payer: Self-pay | Admitting: Pulmonary Disease

## 2022-02-06 DIAGNOSIS — J90 Pleural effusion, not elsewhere classified: Secondary | ICD-10-CM

## 2022-02-07 ENCOUNTER — Ambulatory Visit
Admission: RE | Admit: 2022-02-07 | Discharge: 2022-02-07 | Disposition: A | Discharge status: Home / Self Care | Payer: Medicare Other | Source: Ambulatory Visit | Attending: Pulmonary Disease | Admitting: Pulmonary Disease

## 2022-02-07 ENCOUNTER — Ambulatory Visit
Admission: RE | Admit: 2022-02-07 | Discharge: 2022-02-07 | Disposition: A | Discharge status: Home / Self Care | Payer: Medicare Other | Source: Ambulatory Visit | Attending: Student | Admitting: Student

## 2022-02-07 ENCOUNTER — Other Ambulatory Visit: Payer: Self-pay | Admitting: Pulmonary Disease

## 2022-02-07 DIAGNOSIS — R9721 Rising PSA following treatment for malignant neoplasm of prostate: Secondary | ICD-10-CM | POA: Diagnosis not present

## 2022-02-07 DIAGNOSIS — R911 Solitary pulmonary nodule: Secondary | ICD-10-CM | POA: Insufficient documentation

## 2022-02-07 DIAGNOSIS — J90 Pleural effusion, not elsewhere classified: Secondary | ICD-10-CM

## 2022-02-07 DIAGNOSIS — R3912 Poor urinary stream: Secondary | ICD-10-CM | POA: Diagnosis not present

## 2022-02-07 DIAGNOSIS — C9110 Chronic lymphocytic leukemia of B-cell type not having achieved remission: Secondary | ICD-10-CM | POA: Diagnosis not present

## 2022-02-07 DIAGNOSIS — C7800 Secondary malignant neoplasm of unspecified lung: Secondary | ICD-10-CM | POA: Insufficient documentation

## 2022-02-07 DIAGNOSIS — Z8546 Personal history of malignant neoplasm of prostate: Secondary | ICD-10-CM | POA: Insufficient documentation

## 2022-02-07 NOTE — Procedures (Signed)
PROCEDURE SUMMARY:  Successful US guided left thoracentesis. Yielded 700 ml of bloody fluid. Pt tolerated procedure well. No immediate complications.  Specimen sent for labs. CXR ordered; no post-procedure pneumothorax identified  EBL < 2 mL  Theresa Duty, NP 02/07/2022 10:36 AM

## 2022-02-11 LAB — CYTOLOGY - NON PAP

## 2022-02-14 ENCOUNTER — Telehealth: Payer: Self-pay

## 2022-02-14 NOTE — Patient Outreach (Signed)
  Care Coordination   02/14/2022 Name: Brian Bean MRN: [id number] DOB: December 11, 1940   Care Coordination Outreach Attempts:  An unsuccessful telephone outreach was attempted today to offer the patient information about available care coordination services as a benefit of their health plan.   Follow Up Plan:  Additional outreach attempts will be made to offer the patient care coordination information and services.   Encounter Outcome:  No Answer  Care Coordination Interventions Activated:  No   Care Coordination Interventions:  No, not indicated    [facility] [phone]

## 2022-02-16 ENCOUNTER — Other Ambulatory Visit: Payer: Self-pay | Admitting: Oncology

## 2022-02-16 NOTE — Progress Notes (Signed)
ON PATHWAY REGIMEN - Prostate  No Change  Continue With Treatment as Ordered.  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

## 2022-02-17 ENCOUNTER — Other Ambulatory Visit: Payer: Self-pay

## 2022-02-19 DIAGNOSIS — R3915 Urgency of urination: Secondary | ICD-10-CM | POA: Diagnosis not present

## 2022-02-19 DIAGNOSIS — R109 Unspecified abdominal pain: Secondary | ICD-10-CM | POA: Diagnosis not present

## 2022-02-19 DIAGNOSIS — N281 Cyst of kidney, acquired: Secondary | ICD-10-CM | POA: Diagnosis not present

## 2022-02-19 DIAGNOSIS — K4090 Unilateral inguinal hernia, without obstruction or gangrene, not specified as recurrent: Secondary | ICD-10-CM | POA: Diagnosis not present

## 2022-02-19 DIAGNOSIS — K5730 Diverticulosis of large intestine without perforation or abscess without bleeding: Secondary | ICD-10-CM | POA: Diagnosis not present

## 2022-02-19 DIAGNOSIS — I700 Atherosclerosis of aorta: Secondary | ICD-10-CM | POA: Diagnosis not present

## 2022-03-10 ENCOUNTER — Other Ambulatory Visit: Payer: Self-pay

## 2022-03-17 ENCOUNTER — Other Ambulatory Visit: Payer: Self-pay

## 2022-03-18 ENCOUNTER — Other Ambulatory Visit: Payer: Self-pay

## 2022-04-15 DIAGNOSIS — N3021 Other chronic cystitis with hematuria: Secondary | ICD-10-CM | POA: Diagnosis not present

## 2022-04-15 DIAGNOSIS — R350 Frequency of micturition: Secondary | ICD-10-CM | POA: Diagnosis not present

## 2022-04-23 DIAGNOSIS — E785 Hyperlipidemia, unspecified: Secondary | ICD-10-CM | POA: Diagnosis not present

## 2022-04-23 DIAGNOSIS — I10 Essential (primary) hypertension: Secondary | ICD-10-CM | POA: Diagnosis not present

## 2022-04-30 ENCOUNTER — Ambulatory Visit: Payer: Medicare Other | Admitting: Oncology

## 2022-04-30 ENCOUNTER — Other Ambulatory Visit: Payer: Medicare Other

## 2022-05-05 ENCOUNTER — Other Ambulatory Visit: Payer: Medicare Other

## 2022-05-05 ENCOUNTER — Ambulatory Visit: Payer: Medicare Other | Admitting: Oncology

## 2022-05-09 ENCOUNTER — Encounter: Payer: Self-pay | Admitting: Oncology

## 2022-05-13 DIAGNOSIS — N3021 Other chronic cystitis with hematuria: Secondary | ICD-10-CM | POA: Diagnosis not present

## 2022-05-13 DIAGNOSIS — R3912 Poor urinary stream: Secondary | ICD-10-CM | POA: Diagnosis not present

## 2022-05-13 DIAGNOSIS — R350 Frequency of micturition: Secondary | ICD-10-CM | POA: Diagnosis not present

## 2022-05-22 ENCOUNTER — Inpatient Hospital Stay: Payer: Medicare Other | Attending: Oncology

## 2022-05-22 DIAGNOSIS — C61 Malignant neoplasm of prostate: Secondary | ICD-10-CM | POA: Insufficient documentation

## 2022-05-22 DIAGNOSIS — Z79899 Other long term (current) drug therapy: Secondary | ICD-10-CM | POA: Insufficient documentation

## 2022-05-23 ENCOUNTER — Inpatient Hospital Stay: Payer: Medicare Other

## 2022-05-23 DIAGNOSIS — C61 Malignant neoplasm of prostate: Secondary | ICD-10-CM

## 2022-05-23 DIAGNOSIS — Z79899 Other long term (current) drug therapy: Secondary | ICD-10-CM | POA: Diagnosis not present

## 2022-05-23 LAB — COMPREHENSIVE METABOLIC PANEL
ALT: 22 U/L (ref 0–44)
AST: 18 U/L (ref 15–41)
Albumin: 3.7 g/dL (ref 3.5–5.0)
Alkaline Phosphatase: 50 U/L (ref 38–126)
Anion gap: 11 (ref 5–15)
BUN: 28 mg/dL — ABNORMAL HIGH (ref 8–23)
CO2: 26 mmol/L (ref 22–32)
Calcium: 8.8 mg/dL — ABNORMAL LOW (ref 8.9–10.3)
Chloride: 99 mmol/L (ref 98–111)
Creatinine, Ser: 0.86 mg/dL (ref 0.61–1.24)
GFR, Estimated: >60 mL/min (ref >60)
Glucose, Bld: 113 mg/dL — ABNORMAL HIGH (ref 70–99)
Potassium: 3 mmol/L — ABNORMAL LOW (ref 3.5–5.1)
Sodium: 136 mmol/L (ref 135–145)
Total Bilirubin: 0.8 mg/dL (ref 0.3–1.2)
Total Protein: 6.5 g/dL (ref 6.5–8.1)

## 2022-05-23 LAB — CBC WITH DIFFERENTIAL/PLATELET
Abs Immature Granulocytes: 0.03 10*3/uL (ref 0.00–0.07)
Basophils Absolute: 0.1 10*3/uL (ref 0.0–0.1)
Basophils Relative: 1 %
Eosinophils Absolute: 0.1 10*3/uL (ref 0.0–0.5)
Eosinophils Relative: 1 %
HCT: 39.1 % (ref 39.0–52.0)
Hemoglobin: 13.4 g/dL (ref 13.0–17.0)
Immature Granulocytes: 0 %
Lymphocytes Relative: 53 %
Lymphs Abs: 5.9 10*3/uL — ABNORMAL HIGH (ref 0.7–4.0)
MCH: 29.8 pg (ref 26.0–34.0)
MCHC: 34.3 g/dL (ref 30.0–36.0)
MCV: 87.1 fL (ref 80.0–100.0)
Monocytes Absolute: 0.5 10*3/uL (ref 0.1–1.0)
Monocytes Relative: 5 %
Neutro Abs: 4.4 10*3/uL (ref 1.7–7.7)
Neutrophils Relative %: 40 %
Platelets: 180 10*3/uL (ref 150–400)
RBC: 4.49 MIL/uL (ref 4.22–5.81)
RDW: 13.9 % (ref 11.5–15.5)
WBC: 11 10*3/uL — ABNORMAL HIGH (ref 4.0–10.5)
nRBC: 0 % (ref 0.0–0.2)

## 2022-05-23 LAB — PSA: Prostatic Specific Antigen: 0.02 ng/mL (ref 0.00–4.00)

## 2022-05-26 ENCOUNTER — Encounter: Payer: Self-pay | Admitting: Oncology

## 2022-05-26 ENCOUNTER — Inpatient Hospital Stay: Payer: Medicare Other

## 2022-05-26 ENCOUNTER — Inpatient Hospital Stay (HOSPITAL_BASED_OUTPATIENT_CLINIC_OR_DEPARTMENT_OTHER): Payer: Medicare Other | Admitting: Oncology

## 2022-05-26 VITALS — BP 139/73 | HR 73 | Temp 98.1°F | Resp 18 | Wt 244.4 lb

## 2022-05-26 DIAGNOSIS — C7800 Secondary malignant neoplasm of unspecified lung: Secondary | ICD-10-CM

## 2022-05-26 DIAGNOSIS — C61 Malignant neoplasm of prostate: Secondary | ICD-10-CM | POA: Diagnosis not present

## 2022-05-26 DIAGNOSIS — C9110 Chronic lymphocytic leukemia of B-cell type not having achieved remission: Secondary | ICD-10-CM

## 2022-05-26 DIAGNOSIS — Z79899 Other long term (current) drug therapy: Secondary | ICD-10-CM | POA: Diagnosis not present

## 2022-05-26 MED ORDER — LEUPROLIDE ACETATE (4 MONTH) 30 MG ~~LOC~~ KIT
30.0000 mg | PACK | Freq: Once | SUBCUTANEOUS | Status: AC
  Administered 2022-05-26: 30 mg via SUBCUTANEOUS
  Filled 2022-05-26: qty 30

## 2022-05-26 MED ORDER — POTASSIUM CHLORIDE CRYS ER 20 MEQ PO TBCR: 20.0000 meq | EXTENDED_RELEASE_TABLET | Freq: Every day | ORAL | 0 refills | Status: DC

## 2022-05-26 NOTE — Progress Notes (Unsigned)
Pt here for follow up. Pt has questions regarding labs and treatment.

## 2022-05-27 ENCOUNTER — Encounter: Payer: Self-pay | Admitting: Oncology

## 2022-05-27 ENCOUNTER — Other Ambulatory Visit: Payer: Self-pay

## 2022-05-27 NOTE — Progress Notes (Signed)
Hematology/Oncology Progress note [phone] [phone]            Patient Care Team: Lajean Manes, MD as PCP - General (Internal Medicine) Frederik Pear, MD as Attending Physician (Orthopedic Surgery) Katheren Puller, RN as Oncology Nurse Navigator  REFERRING PROVIDER: Lajean Manes, MD  CHIEF COMPLAINTS/REASON FOR VISIT:  Prostate cancer  ASSESSMENT & PLAN:   Cancer Staging  CLL (chronic lymphocytic leukemia) ([place]) Staging form: Chronic Lymphocytic Leukemia / Small Lymphocytic Lymphoma, AJCC 8th Edition - Clinical stage from 01/10/2021: Modified Rai Stage 0 (Modified Rai risk: Low, Lymphocytosis: Present, Adenopathy: Absent, Organomegaly: Absent, Anemia: Absent, Thrombocytopenia: Absent) - Signed by Heath Lark, MD on 01/10/2021  Prostate cancer [facility]) Staging form: Prostate, AJCC 8th Edition - Clinical: Stage IVB (ycTX, cNX, cM1) - Signed by Earlie Server, MD on 10/08/2021   Prostate cancer ([place]) Stage IV castration sensitive prostate cancer with multiple radiotracer avid lung nodules on PSMA. Previous lung nodule biopsy results are consistent with malignant cells, non-small cell carcinoma, not enough tissue for further work-up for tissue origin. Nodules are avid on PSMA, likely metastatic prostate cancer lung involvement.  PMSA PET scan showed interval decrease of pulmonary nodules.  Consistent with treatment response. Continue Adrogen deprivation therapy.-Lupron 30 mg every 4 months,  Previously there was plan of Docetaxel x 6 cycles, patient deferred chemotherapy.  We discussed about oral agents/androgen receptor pathway inhibitor options.   He prefers to stay on androgen deprivation therapy only  Follow up in 4 months.   CLL (chronic lymphocytic leukemia) ([place]) Diagnosed on 07/08/2012.  Currently on watchful waiting  Orders Placed This Encounter  Procedures   CBC with Differential/Platelet    Standing Status:   Future    Standing Expiration Date:    05/27/2023   Comprehensive metabolic panel    Standing Status:   Future    Standing Expiration Date:   05/26/2023   PSA    Standing Status:   Future    Standing Expiration Date:   05/27/2023     All questions were answered. The patient knows to call the clinic with any problems questions or concerns. Follow up   Follow up  4 months.    Earlie Server, MD, PhD [facility] Hematology Oncology 05/26/2022    HISTORY OF PRESENTING ILLNESS:   Brian Bean is a  81 y.o.  male with PMH listed below was seen in consultation at the request of  Lajean Manes, MD  for evaluation of prostate cancer.  Patient's oncology care was previously with Dr. Zola Button.  Patient presents for second opinion and prefers to transfer care to our cancer center. I reviewed patient's previous oncology records.  Oncology History  CLL (chronic lymphocytic leukemia) ([place])  07/08/2012 Pathology Results   Accession #: [id number]  peripheral blood comfirmed CLL   2015 Initial Diagnosis   #History of prostate cancer, initially diagnosed in June 2015, Gleason score of 6, patient has been on active surveillance between 2015-2019, PSA gradually increasing up to 16.9.   06/24/2016 Pathology Results   CLL FISH showed 3.67% positive for deletion 13 q and 5% positive for p53 deletion   01/10/2021 Cancer Staging   Staging form: Chronic Lymphocytic Leukemia / Small Lymphocytic Lymphoma, AJCC 8th Edition - Clinical stage from 01/10/2021: Modified Rai Stage 0 (Modified Rai risk: Low, Lymphocytosis: Present, Adenopathy: Absent, Organomegaly: Absent, Anemia: Absent, Thrombocytopenia: Absent) - Signed by Heath Lark, MD on 01/10/2021 Stage prefix: Initial diagnosis   Prostate cancer ([place])  2015 Initial Diagnosis   #  History of prostate cancer, initially diagnosed in June 2015, Gleason score of 6, patient has been on active surveillance between 2015-2019, PSA gradually increasing up to 16.9.   06/24/2016 Initial Diagnosis   Prostate  cancer ([place])   12/28/2020 Imaging   12/28/2020, PSMA at [facility] showed multiple bilateral radiotracer avid pulmonary nodules.  For example right upper lobe 1.2 x 1.4 cm and the left lower lobe 1.5 x 1.3 cm.  Prostate showed no focal radiotracer uptake to suggest local recurrence.  No PSMA avid pelvic, retroperitoneal, or mesenteric adenopathy.  Multiple pulmonary radiotracer avid nodules concerning for metastatic disease.  No osseous metastatic disease.  Severe three-vessel coronary artery calcifications   04/16/2021 Initial Biopsy   patient underwent biopsy via bronchoscopy. Lung R LL biopsy showed malignant cells consistent with non-small cell carcinoma. Lung RLL brushing showed malignant cells consistent with non-small cell carcinoma, Lung R UL brushing showed no malignant cells. Lung R UL biopsy showed malignant cells consistent with non-small cell carcinoma. Lung RUL lavage showed no malignant cells identified. Immunostains for TTF-1, Napsin A, p63 and CK5/6 were attempted but are noncontributory.  There is insufficient tumor tissue on the cellblock for additional studies.     05/2021 Tumor Marker   PSA 8.7   05/10/2021 Miscellaneous   05/10/2021, started on androgen deprivation therapy. Eligard 30 mg every 4 months   08/2021 Tumor Marker   PSA 0.2   10/08/2021 Cancer Staging   Staging form: Prostate, AJCC 8th Edition - Clinical: Stage IVB (ycTX, cNX, cM1) - Signed by Earlie Server, MD on 10/08/2021 Stage prefix: Post-therapy   10/23/2021 Imaging   PSMA PET scan showed Small bilateral pulmonary metastases show intense radiopharmaceutical uptake, which show slight decrease in size since prior outside PET-CT. No new or progressive metastatic disease.   11/08/2021 Imaging   CT chest wo contrast showed the scattered pulmonary nodules are mildly reduced in size compared to 04/16/2021. No new nodules identified.   02/07/2022 Procedure   Status post left pleural effusion thoracentesis -cytology not  diagnostic of malignancy.    INTERVAL HISTORY Brian Bean is a 81 y.o. male who has above history reviewed by me today presents for follow up visit for prostate cancer. Patient reports feeling well.   No new complaints. today Review of Systems  Constitutional:  Positive for fatigue. Negative for appetite change, chills, fever and unexpected weight change.  HENT:   Negative for hearing loss and voice change.   Eyes:  Negative for eye problems and icterus.  Respiratory:  Negative for chest tightness, cough and shortness of breath.   Cardiovascular:  Negative for chest pain and leg swelling.  Gastrointestinal:  Negative for abdominal distention and abdominal pain.  Endocrine: Positive for hot flashes.  Genitourinary:  Negative for difficulty urinating, dysuria and frequency.   Musculoskeletal:  Negative for arthralgias.  Skin:  Negative for itching and rash.  Neurological:  Negative for light-headedness and numbness.  Hematological:  Negative for adenopathy. Does not bruise/bleed easily.  Psychiatric/Behavioral:  Negative for confusion.     MEDICAL HISTORY:  Past Medical History:  Diagnosis Date   Anxiety    Arthritis    Cancer ([place])    skin lesion scalp   CLL (chronic lymphocytic leukemia) ([place]) 01/06/2013   CLL (chronic lymphocytic leukemia) ([place])    Depression    Head injury    fell from a horse   Hypertension    Lymphocytosis    PONV (postoperative nausea and vomiting)    extremely sick did  not get sick after hip surgery in 2013   Prostate cancer [facility])     SURGICAL HISTORY: Past Surgical History:  Procedure Laterality Date   APPENDECTOMY     BRONCHIAL BIOPSY  04/16/2021   Procedure: BRONCHIAL BIOPSIES;  Surgeon: Garner Nash, DO;  Location: [place] [facility];  Service: Pulmonary;;   BRONCHIAL BRUSHINGS  04/16/2021   Procedure: BRONCHIAL BRUSHINGS;  Surgeon: Garner Nash, DO;  Location: [place] [facility];  Service: Pulmonary;;   BRONCHIAL NEEDLE ASPIRATION BIOPSY   04/16/2021   Procedure: BRONCHIAL NEEDLE ASPIRATION BIOPSIES;  Surgeon: Garner Nash, DO;  Location: [place] [facility];  Service: Pulmonary;;   BRONCHIAL WASHINGS  04/16/2021   Procedure: BRONCHIAL WASHINGS;  Surgeon: Garner Nash, DO;  Location: [place] [facility];  Service: Pulmonary;;   CATARACT EXTRACTION, BILATERAL     CERVICAL DISC SURGERY  00   COLONOSCOPY  2011   [place]     rt ankle   HERNIA REPAIR     rt and  undistended testicle   PROSTATE BIOPSY     REVERSE SHOULDER ARTHROPLASTY Right 05/01/2020   Procedure: REVERSE SHOULDER ARTHROPLASTY;  Surgeon: Corky Mull, MD;  Location: [facility];  Service: Orthopedics;  Laterality: Right;   rt hip  12   pinned   TOTAL HIP ARTHROPLASTY  05/03/2012   Procedure: TOTAL HIP ARTHROPLASTY;  Surgeon: Kerin Salen, MD;  Location: [place];  Service: Orthopedics;  Laterality: Right;   VIDEO BRONCHOSCOPY WITH ENDOBRONCHIAL NAVIGATION Right 04/16/2021   Procedure: VIDEO BRONCHOSCOPY WITH ENDOBRONCHIAL NAVIGATION;  Surgeon: Garner Nash, DO;  Location: [place];  Service: Pulmonary;  Laterality: Right;  ION w/ CIOS   VIDEO BRONCHOSCOPY WITH RADIAL ENDOBRONCHIAL ULTRASOUND  04/16/2021   Procedure: RADIAL ENDOBRONCHIAL ULTRASOUND;  Surgeon: Garner Nash, DO;  Location: [facility];  Service: Pulmonary;;    SOCIAL HISTORY: Social History   Socioeconomic History   Marital status: Divorced    Spouse name: Not on file   Number of children: 1   Years of education: Not on file   Highest education level: Not on file  Occupational History    Comment: retired Copywriter, advertising ([employer]  Tobacco Use   Smoking status: Former    Packs/day: 0.50    Years: 10.00    Total pack years: 5.00    Types: Cigarettes    Quit date: 04/28/1977    Years since quitting: 45.1   Smokeless tobacco: Never   Tobacco comments:    10 oz alcohol wkly  Vaping Use   Vaping Use: Never used  Substance and Sexual Activity   Alcohol  use: Yes    Alcohol/week: 21.0 standard drinks of alcohol    Types: 5 Glasses of wine, 16 Shots of liquor per week    Comment: 14 ounces a week   Drug use: No   Sexual activity: Yes  Other Topics Concern   Not on file  Social History Narrative   Retired.   Significant other: Nicanor Bake   One son, Saralyn Pilar   Social Determinants of Health   Financial Resource Strain: Not on [employer] Insecurity: Not on file  Transportation Needs: Not on file  Physical Activity: Not on file  Stress: Not on file  Social Connections: Not on file  Intimate Partner Violence: Not on file    FAMILY HISTORY: Family History  Problem Relation Age of Onset   Heart disease Mother    Cancer Father  unk type of cancer    ALLERGIES:  has No Known Allergies.  MEDICATIONS:  Current Outpatient Medications  Medication Sig Dispense Refill   amLODipine (NORVASC) 5 MG tablet Take 5 mg by mouth 2 (two) times daily.      aspirin EC 81 MG tablet Take 81 mg by mouth daily. Swallow whole.     atorvastatin (LIPITOR) 20 MG tablet Take 20 mg by mouth daily.     carvedilol (COREG) 12.5 MG tablet Take 12.5 mg by mouth 2 (two) times daily with a meal.      FLUoxetine (PROZAC) 40 MG capsule Take 80 mg by mouth daily.     hydrochlorothiazide (HYDRODIURIL) 25 MG tablet Take 25 mg by mouth daily.      ipratropium (ATROVENT) 0.06 % nasal spray Place 2 sprays into both nostrils as needed for rhinitis.     ketoconazole (NIZORAL) 2 % shampoo Apply 1 application topically daily as needed for irritation.     lisinopril (PRINIVIL,ZESTRIL) 10 MG tablet Take 10 mg by mouth daily.     mirabegron ER (MYRBETRIQ) 25 MG TB24 tablet Take 25 mg by mouth daily.     potassium chloride SA (KLOR-CON M) 20 MEQ tablet Take 1 tablet (20 mEq total) by mouth daily. 7 tablet 0   tamsulosin (FLOMAX) 0.4 MG CAPS capsule Take 0.4 mg by mouth daily.     fluorouracil (EFUDEX) 5 % cream Apply 1 application topically 2 (two) times daily as needed  (precancerous spots). (Patient not taking: Reported on 05/26/2022)     naproxen sodium (ALEVE) 220 MG tablet Take 220 mg by mouth daily as needed (pain). (Patient not taking: Reported on 05/26/2022)     OLANZapine (ZYPREXA) 2.5 MG tablet Take 2.5 mg by mouth at bedtime. (Patient not taking: Reported on 05/26/2022)     No current facility-administered medications for this visit.     PHYSICAL EXAMINATION: ECOG PERFORMANCE STATUS: 1 - Symptomatic but completely ambulatory Vitals:   05/26/22 1449  BP: 139/73  Pulse: 73  Resp: 18  Temp: 98.1 F (36.7 C)   Filed Weights   05/26/22 1449  Weight: 244 lb 6.4 oz (110.9 kg)    Physical Exam Constitutional:      General: He is not in acute distress. HENT:     Head: Normocephalic and atraumatic.  Eyes:     General: No scleral icterus. Cardiovascular:     Rate and Rhythm: Normal rate and regular rhythm.     Heart sounds: Normal heart sounds.  Pulmonary:     Effort: Pulmonary effort is normal. No respiratory distress.     Breath sounds: No wheezing.  Abdominal:     General: Bowel sounds are normal. There is no distension.     Palpations: Abdomen is soft.  Musculoskeletal:        General: No deformity. Normal range of motion.     Cervical back: Normal range of motion and neck supple.  Skin:    General: Skin is warm and dry.     Findings: No erythema or rash.  Neurological:     Mental Status: He is alert and oriented to person, place, and time. Mental status is at baseline.     Cranial Nerves: No cranial nerve deficit.     Coordination: Coordination normal.  Psychiatric:        Mood and Affect: Mood normal.     LABORATORY DATA:  I have reviewed the data as listed Lab Results  Component Value Date  WBC 11.0 (H) 05/23/2022   HGB 13.4 05/23/2022   HCT 39.1 05/23/2022   MCV 87.1 05/23/2022   PLT 180 05/23/2022   Recent Labs    12/18/21 0822 01/26/22 1356 01/27/22 0519 05/23/22 0935  NA 139 139 138 136  K 3.9 3.5 3.5  3.0*  CL 105 101 102 99  CO2 _0 GLUCOSE 116* 144* 136* 113*  BUN 24* 24* 24* 28*  CREATININE 0.80 0.66 0.71 0.86  CALCIUM 8.8* 9.4 8.8* 8.8*  GFRNONAA >60 >60 >60 >60  PROT 6.4* 7.0  --  6.5  ALBUMIN 3.9 4.2  --  3.7  AST 19 20  --  18  ALT 19 21  --  22  ALKPHOS 51 49  --  50  BILITOT 0.4 1.2  --  0.8  BILIDIR  --  0.2  --   --   IBILI  --  1.0*  --   --     Iron/TIBC/Ferritin/ %Sat No results found for: "IRON", "TIBC", "FERRITIN", "IRONPCTSAT"    RADIOGRAPHIC STUDIES: I have personally reviewed the radiological images as listed and agreed with the findings in the report. No results found.    cc Lajean Manes, MD

## 2022-05-27 NOTE — Assessment & Plan Note (Signed)
Stage IV castration sensitive prostate cancer with multiple radiotracer avid lung nodules on PSMA. Previous lung nodule biopsy results are consistent with malignant cells, non-small cell carcinoma, not enough tissue for further work-up for tissue origin. Nodules are avid on PSMA, likely metastatic prostate cancer lung involvement.  PMSA PET scan showed interval decrease of pulmonary nodules.  Consistent with treatment response. Continue Adrogen deprivation therapy.-Lupron 30 mg every 4 months,  Previously there was plan of Docetaxel x 6 cycles, patient deferred chemotherapy.  We discussed about oral agents/androgen receptor pathway inhibitor options.   He prefers to stay on androgen deprivation therapy only  Follow up in 4 months.

## 2022-05-27 NOTE — Assessment & Plan Note (Signed)
Diagnosed on 07/08/2012.  Currently on watchful waiting

## 2022-05-28 DIAGNOSIS — C7802 Secondary malignant neoplasm of left lung: Secondary | ICD-10-CM | POA: Diagnosis not present

## 2022-05-28 DIAGNOSIS — C7801 Secondary malignant neoplasm of right lung: Secondary | ICD-10-CM | POA: Diagnosis not present

## 2022-06-11 ENCOUNTER — Encounter: Payer: Self-pay | Admitting: Oncology

## 2022-06-12 ENCOUNTER — Telehealth: Payer: Self-pay | Admitting: Oncology

## 2022-06-12 NOTE — Telephone Encounter (Signed)
FYI, what tx does pt need chemo edu for?

## 2022-06-12 NOTE — Telephone Encounter (Addendum)
Spoke to pt and he states that he would like to proceed with IV chemo rather than oral agents. Informed him that Dr. Tasia Catchings would like to have a discussion with him prior to starting treatment.   Pt requesting appt next week. Please contact pt to set up appt (the end of next weeks looks better for appts) - MD only. Ok to use chemo slot next Thursday, if pt can do.

## 2022-06-12 NOTE — Telephone Encounter (Signed)
This patient left a voicemail stating he would like to start treatment the 3rd week of this month and request Benjamine Mola give him a call.

## 2022-06-13 ENCOUNTER — Inpatient Hospital Stay: Payer: Medicare Other | Admitting: Oncology

## 2022-06-19 ENCOUNTER — Telehealth: Payer: Self-pay | Admitting: Pharmacist

## 2022-06-19 ENCOUNTER — Other Ambulatory Visit (HOSPITAL_COMMUNITY): Payer: Self-pay

## 2022-06-19 ENCOUNTER — Telehealth: Payer: Self-pay

## 2022-06-19 ENCOUNTER — Encounter: Payer: Self-pay | Admitting: Oncology

## 2022-06-19 ENCOUNTER — Inpatient Hospital Stay: Payer: Medicare Other | Attending: Oncology | Admitting: Oncology

## 2022-06-19 VITALS — BP 127/63 | HR 64 | Temp 96.6°F | Resp 18 | Wt 214.4 lb

## 2022-06-19 DIAGNOSIS — C7801 Secondary malignant neoplasm of right lung: Secondary | ICD-10-CM | POA: Insufficient documentation

## 2022-06-19 DIAGNOSIS — C9110 Chronic lymphocytic leukemia of B-cell type not having achieved remission: Secondary | ICD-10-CM

## 2022-06-19 DIAGNOSIS — C61 Malignant neoplasm of prostate: Secondary | ICD-10-CM

## 2022-06-19 DIAGNOSIS — C7802 Secondary malignant neoplasm of left lung: Secondary | ICD-10-CM | POA: Diagnosis not present

## 2022-06-19 DIAGNOSIS — Z79899 Other long term (current) drug therapy: Secondary | ICD-10-CM | POA: Insufficient documentation

## 2022-06-19 NOTE — Assessment & Plan Note (Addendum)
Stage IV castration sensitive prostate cancer with multiple radiotracer avid lung nodules on PSMA. Previous lung nodule biopsy results are consistent with malignant cells, non-small cell carcinoma, not enough tissue for further work-up for tissue origin. Nodules are avid on PSMA, likely metastatic prostate cancer lung involvement.  10/24/21 PMSA PET scan showed interval decrease of pulmonary nodules-treatment response.  Continue Adrogen deprivation therapy.-Lupron 30 mg every 4 months Patient previously declined second systemic treatment.  Brian Bean changed his mind recently and today we had a lengthy discussion about options: Docetaxel 75 mg/m2 every 3 weeks x 6 versus oral systemic agent.  Rationale and potential side effects were reviewed and discussed with patient. Considering his age, other medical problems, caregiver for his partner, she decision was made to proceed with oral systemic agent-ie darolutamide, apalutamide etc.  Check insurance approval Brian Bean agrees with the plan.

## 2022-06-19 NOTE — Telephone Encounter (Signed)
Oral Oncology Patient Advocate Encounter   Received notification that prior authorization for [facility] is required.   PA submitted on 06/19/22  Key [id number]  Status is pending     Berdine Addison, [facility] [place]  [phone] (phone) [phone] (fax) 06/19/2022 2:31 PM

## 2022-06-19 NOTE — Progress Notes (Signed)
Hematology/Oncology Progress note [phone] [phone]            Patient Care Team: Lajean Manes, MD as PCP - General (Internal Medicine) Frederik Pear, MD as Attending Physician (Orthopedic Surgery) Katheren Puller, RN as Oncology Nurse Navigator  REFERRING PROVIDER: Lajean Manes, MD  CHIEF COMPLAINTS/REASON FOR VISIT:  Prostate cancer  ASSESSMENT & PLAN:   Cancer Staging  CLL (chronic lymphocytic leukemia) ([place]) Staging form: Chronic Lymphocytic Leukemia / Small Lymphocytic Lymphoma, AJCC 8th Edition - Clinical stage from 01/10/2021: Modified Rai Stage 0 (Modified Rai risk: Low, Lymphocytosis: Present, Adenopathy: Absent, Organomegaly: Absent, Anemia: Absent, Thrombocytopenia: Absent) - Signed by Heath Lark, MD on 01/10/2021  Prostate cancer [facility]) Staging form: Prostate, AJCC 8th Edition - Clinical: Stage IVB (ycTX, cNX, cM1) - Signed by Earlie Server, MD on 10/08/2021   Prostate cancer ([place]) Stage IV castration sensitive prostate cancer with multiple radiotracer avid lung nodules on PSMA. Previous lung nodule biopsy results are consistent with malignant cells, non-small cell carcinoma, not enough tissue for further work-up for tissue origin. Nodules are avid on PSMA, likely metastatic prostate cancer lung involvement.  10/24/21 PMSA PET scan showed interval decrease of pulmonary nodules-treatment response.  Continue Adrogen deprivation therapy.-Lupron 30 mg every 4 months Patient previously declined second systemic treatment.  He changed his mind recently and today we had a lengthy discussion about options: Docetaxel 75 mg/m2 every 3 weeks x 6 versus oral systemic agent.  Rationale and potential side effects were reviewed and discussed with patient. Considering his age, other medical problems, caregiver for his partner, she decision was made to proceed with oral systemic agent-ie darolutamide, apalutamide etc.  Check insurance approval He agrees with the  plan.  CLL (chronic lymphocytic leukemia) ([place]) Diagnosed on 07/08/2012.  Currently on watchful waiting  No orders of the defined types were placed in this encounter.    All questions were answered. The patient knows to call the clinic with any problems questions or concerns. Follow up to be determined.  Plan to see patient 2 weeks after he starts oral chemotherapy.  Earlie Server, MD, PhD [facility] Hematology Oncology 06/19/2022    HISTORY OF PRESENTING ILLNESS:   Brian Bean is a  82 y.o.  male with PMH listed below was seen in consultation at the request of  Lajean Manes, MD  for evaluation of prostate cancer.  Patient's oncology care was previously with Dr. Zola Button.  Patient presents for second opinion and prefers to transfer care to our cancer center. I reviewed patient's previous oncology records.  Oncology History  CLL (chronic lymphocytic leukemia) ([place])  07/08/2012 Pathology Results   Accession #: [id number]  peripheral blood comfirmed CLL   2015 Initial Diagnosis   #History of prostate cancer, initially diagnosed in June 2015, Gleason score of 6, patient has been on active surveillance between 2015-2019, PSA gradually increasing up to 16.9.   06/24/2016 Pathology Results   CLL FISH showed 3.67% positive for deletion 13 q and 5% positive for p53 deletion   01/10/2021 Cancer Staging   Staging form: Chronic Lymphocytic Leukemia / Small Lymphocytic Lymphoma, AJCC 8th Edition - Clinical stage from 01/10/2021: Modified Rai Stage 0 (Modified Rai risk: Low, Lymphocytosis: Present, Adenopathy: Absent, Organomegaly: Absent, Anemia: Absent, Thrombocytopenia: Absent) - Signed by Heath Lark, MD on 01/10/2021 Stage prefix: Initial diagnosis   Prostate cancer [facility])  2015 Initial Diagnosis   #History of prostate cancer, initially diagnosed in June 2015, Gleason score of 6, patient has been on active surveillance  between 2015-2019, PSA gradually increasing up to 16.9.   06/24/2016  Initial Diagnosis   Prostate cancer ([place])   12/28/2020 Imaging   12/28/2020, PSMA at [facility] showed multiple bilateral radiotracer avid pulmonary nodules.  For example right upper lobe 1.2 x 1.4 cm and the left lower lobe 1.5 x 1.3 cm.  Prostate showed no focal radiotracer uptake to suggest local recurrence.  No PSMA avid pelvic, retroperitoneal, or mesenteric adenopathy.  Multiple pulmonary radiotracer avid nodules concerning for metastatic disease.  No osseous metastatic disease.  Severe three-vessel coronary artery calcifications   04/16/2021 Initial Biopsy   patient underwent biopsy via bronchoscopy. Lung R LL biopsy showed malignant cells consistent with non-small cell carcinoma. Lung RLL brushing showed malignant cells consistent with non-small cell carcinoma, Lung R UL brushing showed no malignant cells. Lung R UL biopsy showed malignant cells consistent with non-small cell carcinoma. Lung RUL lavage showed no malignant cells identified. Immunostains for TTF-1, Napsin A, p63 and CK5/6 were attempted but are noncontributory.  There is insufficient tumor tissue on the cellblock for additional studies.     05/2021 Tumor Marker   PSA 8.7   05/10/2021 Miscellaneous   05/10/2021, started on androgen deprivation therapy. Eligard 30 mg every 4 months   08/2021 Tumor Marker   PSA 0.2   10/08/2021 Cancer Staging   Staging form: Prostate, AJCC 8th Edition - Clinical: Stage IVB (ycTX, cNX, cM1) - Signed by Earlie Server, MD on 10/08/2021 Stage prefix: Post-therapy   10/23/2021 Imaging   PSMA PET scan showed Small bilateral pulmonary metastases show intense radiopharmaceutical uptake, which show slight decrease in size since prior outside PET-CT. No new or progressive metastatic disease.   11/08/2021 Imaging   CT chest wo contrast showed the scattered pulmonary nodules are mildly reduced in size compared to 04/16/2021. No new nodules identified.   02/07/2022 Procedure   Status post left pleural effusion  thoracentesis -cytology not diagnostic of malignancy.    INTERVAL HISTORY Ramar Nobrega is a 82 y.o. male who has above history reviewed by me today presents for follow up visit for prostate cancer. Patient reports feeling well.   No new complaints. today.  During interval, he has changed his mind.  He is now interested in chemotherapy for his prostate cancer.  He is here for further discussion. Review of Systems  Constitutional:  Positive for fatigue. Negative for appetite change, chills, fever and unexpected weight change.  HENT:   Negative for hearing loss and voice change.   Eyes:  Negative for eye problems and icterus.  Respiratory:  Negative for chest tightness, cough and shortness of breath.   Cardiovascular:  Negative for chest pain and leg swelling.  Gastrointestinal:  Negative for abdominal distention and abdominal pain.  Endocrine: Positive for hot flashes.  Genitourinary:  Negative for difficulty urinating, dysuria and frequency.   Musculoskeletal:  Negative for arthralgias.  Skin:  Negative for itching and rash.  Neurological:  Negative for light-headedness and numbness.  Hematological:  Negative for adenopathy. Does not bruise/bleed easily.  Psychiatric/Behavioral:  Negative for confusion.     MEDICAL HISTORY:  Past Medical History:  Diagnosis Date   Anxiety    Arthritis    Cancer ([place])    skin lesion scalp   CLL (chronic lymphocytic leukemia) ([place]) 01/06/2013   CLL (chronic lymphocytic leukemia) ([place])    Depression    Head injury    fell from a horse   Hypertension    Lymphocytosis    PONV (postoperative nausea and  vomiting)    extremely sick did not get sick after hip surgery in 2013   Prostate cancer [facility])     SURGICAL HISTORY: Past Surgical History:  Procedure Laterality Date   APPENDECTOMY     BRONCHIAL BIOPSY  04/16/2021   Procedure: BRONCHIAL BIOPSIES;  Surgeon: Garner Nash, DO;  Location: [facility];  Service: Pulmonary;;   BRONCHIAL  BRUSHINGS  04/16/2021   Procedure: BRONCHIAL BRUSHINGS;  Surgeon: Garner Nash, DO;  Location: [facility];  Service: Pulmonary;;   BRONCHIAL NEEDLE ASPIRATION BIOPSY  04/16/2021   Procedure: BRONCHIAL NEEDLE ASPIRATION BIOPSIES;  Surgeon: Garner Nash, DO;  Location: [place] [facility];  Service: Pulmonary;;   BRONCHIAL WASHINGS  04/16/2021   Procedure: BRONCHIAL WASHINGS;  Surgeon: Garner Nash, DO;  Location: [place] [facility];  Service: Pulmonary;;   CATARACT EXTRACTION, BILATERAL     CERVICAL DISC SURGERY  00   COLONOSCOPY  2011   [place]     rt ankle   HERNIA REPAIR     rt and  undistended testicle   PROSTATE BIOPSY     REVERSE SHOULDER ARTHROPLASTY Right 05/01/2020   Procedure: REVERSE SHOULDER ARTHROPLASTY;  Surgeon: Corky Mull, MD;  Location: [facility];  Service: Orthopedics;  Laterality: Right;   rt hip  12   pinned   TOTAL HIP ARTHROPLASTY  05/03/2012   Procedure: TOTAL HIP ARTHROPLASTY;  Surgeon: Kerin Salen, MD;  Location: [place];  Service: Orthopedics;  Laterality: Right;   VIDEO BRONCHOSCOPY WITH ENDOBRONCHIAL NAVIGATION Right 04/16/2021   Procedure: VIDEO BRONCHOSCOPY WITH ENDOBRONCHIAL NAVIGATION;  Surgeon: Garner Nash, DO;  Location: [place];  Service: Pulmonary;  Laterality: Right;  ION w/ CIOS   VIDEO BRONCHOSCOPY WITH RADIAL ENDOBRONCHIAL ULTRASOUND  04/16/2021   Procedure: RADIAL ENDOBRONCHIAL ULTRASOUND;  Surgeon: Garner Nash, DO;  Location: [facility];  Service: Pulmonary;;    SOCIAL HISTORY: Social History   Socioeconomic History   Marital status: Divorced    Spouse name: Not on file   Number of children: 1   Years of education: Not on file   Highest education level: Not on file  Occupational History    Comment: retired Copywriter, advertising ([employer]  Tobacco Use   Smoking status: Former    Packs/day: 0.50    Years: 10.00    Total pack years: 5.00    Types: Cigarettes    Quit date: 04/28/1977    Years  since quitting: 45.1   Smokeless tobacco: Never   Tobacco comments:    10 oz alcohol wkly  Vaping Use   Vaping Use: Never used  Substance and Sexual Activity   Alcohol use: Yes    Alcohol/week: 21.0 standard drinks of alcohol    Types: 5 Glasses of wine, 16 Shots of liquor per week    Comment: 14 ounces a week   Drug use: No   Sexual activity: Yes  Other Topics Concern   Not on file  Social History Narrative   Retired.   Significant other: Nicanor Bake   One son, Saralyn Pilar   Social Determinants of Health   Financial Resource Strain: Not on [employer] Insecurity: Not on file  Transportation Needs: Not on file  Physical Activity: Not on file  Stress: Not on file  Social Connections: Not on file  Intimate Partner Violence: Not on file    FAMILY HISTORY: Family History  Problem Relation Age of Onset   Heart disease Mother  Cancer Father        unk type of cancer    ALLERGIES:  has No Known Allergies.  MEDICATIONS:  Current Outpatient Medications  Medication Sig Dispense Refill   amLODipine (NORVASC) 5 MG tablet Take 5 mg by mouth 2 (two) times daily.      aspirin EC 81 MG tablet Take 81 mg by mouth daily. Swallow whole.     atorvastatin (LIPITOR) 20 MG tablet Take 20 mg by mouth daily.     carvedilol (COREG) 12.5 MG tablet Take 12.5 mg by mouth 2 (two) times daily with a meal.      FLUoxetine (PROZAC) 40 MG capsule Take 80 mg by mouth daily.     hydrochlorothiazide (HYDRODIURIL) 25 MG tablet Take 25 mg by mouth daily.      ipratropium (ATROVENT) 0.06 % nasal spray Place 2 sprays into both nostrils as needed for rhinitis.     ketoconazole (NIZORAL) 2 % shampoo Apply 1 application topically daily as needed for irritation.     lisinopril (PRINIVIL,ZESTRIL) 10 MG tablet Take 10 mg by mouth daily.     mirabegron ER (MYRBETRIQ) 25 MG TB24 tablet Take 25 mg by mouth daily.     potassium chloride SA (KLOR-CON M) 20 MEQ tablet Take 1 tablet (20 mEq total) by mouth daily. 7  tablet 0   tamsulosin (FLOMAX) 0.4 MG CAPS capsule Take 0.4 mg by mouth daily.     fluorouracil (EFUDEX) 5 % cream Apply 1 application topically 2 (two) times daily as needed (precancerous spots). (Patient not taking: Reported on 05/26/2022)     naproxen sodium (ALEVE) 220 MG tablet Take 220 mg by mouth daily as needed (pain). (Patient not taking: Reported on 05/26/2022)     OLANZapine (ZYPREXA) 2.5 MG tablet Take 2.5 mg by mouth at bedtime. (Patient not taking: Reported on 05/26/2022)     No current facility-administered medications for this visit.     PHYSICAL EXAMINATION: ECOG PERFORMANCE STATUS: 1 - Symptomatic but completely ambulatory Vitals:   06/19/22 0838  BP: 127/63  Pulse: 64  Resp: 18  Temp: (!) 96.6 F (35.9 C)   Filed Weights   06/19/22 0838  Weight: 214 lb 6.4 oz (97.3 kg)    Physical Exam Constitutional:      General: He is not in acute distress. HENT:     Head: Normocephalic and atraumatic.  Eyes:     General: No scleral icterus. Cardiovascular:     Rate and Rhythm: Normal rate and regular rhythm.     Heart sounds: Normal heart sounds.  Pulmonary:     Effort: Pulmonary effort is normal. No respiratory distress.     Breath sounds: No wheezing.  Abdominal:     General: Bowel sounds are normal. There is no distension.     Palpations: Abdomen is soft.  Musculoskeletal:        General: No deformity. Normal range of motion.     Cervical back: Normal range of motion and neck supple.  Skin:    General: Skin is warm and dry.     Findings: No erythema or rash.  Neurological:     Mental Status: He is alert and oriented to person, place, and time. Mental status is at baseline.     Cranial Nerves: No cranial nerve deficit.     Coordination: Coordination normal.  Psychiatric:        Mood and Affect: Mood normal.     LABORATORY DATA:  I have reviewed the data  as listed Lab Results  Component Value Date   WBC 11.0 (H) 05/23/2022   HGB 13.4 05/23/2022    HCT 39.1 05/23/2022   MCV 87.1 05/23/2022   PLT 180 05/23/2022   Recent Labs    12/18/21 0822 01/26/22 1356 01/27/22 0519 05/23/22 0935  NA 139 139 138 136  K 3.9 3.5 3.5 3.0*  CL 105 101 102 99  CO2 28 28 30 26   GLUCOSE 116* 144* 136* 113*  BUN 24* 24* 24* 28*  CREATININE 0.80 0.66 0.71 0.86  CALCIUM 8.8* 9.4 8.8* 8.8*  GFRNONAA >60 >60 >60 >60  PROT 6.4* 7.0  --  6.5  ALBUMIN 3.9 4.2  --  3.7  AST 19 20  --  18  ALT 19 21  --  22  ALKPHOS 51 49  --  50  BILITOT 0.4 1.2  --  0.8  BILIDIR  --  0.2  --   --   IBILI  --  1.0*  --   --     Iron/TIBC/Ferritin/ %Sat No results found for: "IRON", "TIBC", "FERRITIN", "IRONPCTSAT"    RADIOGRAPHIC STUDIES: I have personally reviewed the radiological images as listed and agreed with the findings in the report. No results found.    cc Lajean Manes, MD

## 2022-06-19 NOTE — Assessment & Plan Note (Signed)
Diagnosed on 07/08/2012.  Currently on watchful waiting

## 2022-06-19 NOTE — Telephone Encounter (Signed)
Oral Oncology Patient Advocate Encounter  Prior Authorization for [facility] has been approved.    PA# [id number]  Effective dates: 06/19/22 through 06/09/23  Patients co-pay is $3,250.92.    Berdine Addison, [place] [facility] Patient [place]  [phone] (phone) [phone] (fax) 06/19/2022 3:12 PM

## 2022-06-19 NOTE — Telephone Encounter (Signed)
Oral Oncology Pharmacist Encounter  Received new prescription for Erleada (apalutamide) for the treatment of metastatic castration sensitive prostate cancer in conjunction with ADT, planned duration until disease progression or unacceptable drug toxicity.  Prescription dose and frequency assessed.   Current medication list in Epic reviewed, a few DDIs with apalutamide identified: Apalutamide may decrease the serum concentration of amlodipine, atorvastatin, and mirabegron. Monitor patient for decreased effectiveness of the listed medications. No baseline dose adjustment needed.   Evaluated chart and no patient barriers to medication adherence identified.   Prescription has been e-scribed to the [facility] for benefits analysis and approval.  Oral Oncology Clinic will continue to follow for insurance authorization, copayment issues, initial counseling and start date.   Darl Pikes, PharmD, BCPS, BCOP, CPP Hematology/Oncology Clinical Pharmacist Practitioner [place]/DB/AP [facility] [facility] [phone]  06/19/2022 4:37 PM

## 2022-06-20 ENCOUNTER — Telehealth: Payer: Self-pay

## 2022-06-20 ENCOUNTER — Other Ambulatory Visit (HOSPITAL_COMMUNITY): Payer: Self-pay

## 2022-06-20 NOTE — Telephone Encounter (Signed)
Oral Oncology Patient Advocate Encounter   Began application for assistance for Erleada through [facility] Patient [facility].   Application will be submitted upon completion of necessary supporting documentation.   [employer]'s phone number [phone].   I will continue to check the status until final determination.   Ardeen Fillers, CPhT Oncology Pharmacy Patient Advocate  [facility]  [phone] (phone) [phone] (fax) 06/20/2022 3:51 PM

## 2022-06-23 NOTE — Telephone Encounter (Signed)
Oral Oncology Patient Advocate Encounter  Reached out and spoke with patient regarding PAP paperwork, explained that I would send it to their preferred email via DocuSign.   Confirmed email address: [email or web address].    Patient expressed understanding and consent.  Will follow up once paperwork has been signed and returned.   Ardeen Fillers, CPhT Oncology Pharmacy Patient Advocate  [facility]  [phone] (phone) [phone] (fax) 06/23/2022 4:00 PM

## 2022-06-24 NOTE — Telephone Encounter (Signed)
Received notification that application was received and is being processed. I will continue to follow and update until final determination.   Ardeen Fillers, CPhT Oncology Pharmacy Patient Advocate  [facility]  [phone] (phone) [phone] (fax) 06/24/2022 4:02 PM

## 2022-06-24 NOTE — Telephone Encounter (Signed)
Oral Oncology Patient Advocate Encounter   Submitted application for assistance for Erleada to [employer].   Application submitted via e-fax to [phone]   [employer]'s phone number [phone].   I will continue to check the status until final determination.   Ardeen Fillers, CPhT Oncology Pharmacy Patient Advocate  [facility]  [phone] (phone) [phone] (fax) 06/24/2022 10:00 AM

## 2022-07-02 NOTE — Telephone Encounter (Addendum)
Oral Oncology Patient Advocate Encounter   Received notification that the application for assistance for Erleada through [facility] Patient [facility] has been approved.   [employer] phone number [phone].   Effective dates: 06/26/22 through 06/09/23  I have spoken to the patient.  Berdine Addison, [place] [facility] Patient [place]  [phone] (phone) [phone] (fax) 07/02/2022 1:23 PM

## 2022-07-02 NOTE — Telephone Encounter (Signed)
Oral Chemotherapy Pharmacist Encounter  Patient Education I spoke with patient for overview of new oral chemotherapy medication: Erleada (apalutamide) for the treatment of metastatic castration sensitive prostate cancer in conjunction with ADT, planned duration until disease progression or unacceptable drug toxicity .   Pt is doing well. Counseled patient on administration, dosing, side effects, monitoring, drug-food interactions, safe handling, storage, and disposal. Patient will take 4 tablets (240 mg) by mouth daily. .  Side effects include but not limited to: rash, fatigue, decreased wbc.    Reviewed with patient importance of keeping a medication schedule and plan for any missed doses.  After discussion with patient no patient barriers to medication adherence identified.   [name] voiced understanding and appreciation. All questions answered. Medication handout provided.  Provided patient with Oral [place] [facility] phone number. Patient knows to call the office with questions or concerns. Oral Chemotherapy Navigation Clinic will continue to follow.  Darl Pikes, PharmD, BCPS, BCOP, CPP Hematology/Oncology Clinical Pharmacist Practitioner [place]/DB/AP [facility] [place] [facility] [phone]  07/02/2022 2:40 PM

## 2022-07-05 ENCOUNTER — Encounter: Payer: Self-pay | Admitting: Oncology

## 2022-07-07 ENCOUNTER — Other Ambulatory Visit: Payer: Self-pay

## 2022-07-07 DIAGNOSIS — C9110 Chronic lymphocytic leukemia of B-cell type not having achieved remission: Secondary | ICD-10-CM

## 2022-07-15 ENCOUNTER — Encounter: Payer: Self-pay | Admitting: Oncology

## 2022-07-15 ENCOUNTER — Inpatient Hospital Stay: Payer: Medicare Other | Attending: Oncology

## 2022-07-15 ENCOUNTER — Inpatient Hospital Stay: Payer: Medicare Other | Admitting: Oncology

## 2022-07-15 ENCOUNTER — Inpatient Hospital Stay: Payer: Medicare Other | Admitting: Pharmacist

## 2022-07-15 VITALS — BP 132/70 | HR 64 | Temp 97.9°F | Wt 213.3 lb

## 2022-07-15 DIAGNOSIS — Z8546 Personal history of malignant neoplasm of prostate: Secondary | ICD-10-CM | POA: Diagnosis not present

## 2022-07-15 DIAGNOSIS — C7801 Secondary malignant neoplasm of right lung: Secondary | ICD-10-CM | POA: Insufficient documentation

## 2022-07-15 DIAGNOSIS — C61 Malignant neoplasm of prostate: Secondary | ICD-10-CM

## 2022-07-15 DIAGNOSIS — Z856 Personal history of leukemia: Secondary | ICD-10-CM | POA: Diagnosis not present

## 2022-07-15 DIAGNOSIS — Z79899 Other long term (current) drug therapy: Secondary | ICD-10-CM | POA: Insufficient documentation

## 2022-07-15 DIAGNOSIS — C3431 Malignant neoplasm of lower lobe, right bronchus or lung: Secondary | ICD-10-CM | POA: Diagnosis not present

## 2022-07-15 DIAGNOSIS — Z5111 Encounter for antineoplastic chemotherapy: Secondary | ICD-10-CM | POA: Diagnosis not present

## 2022-07-15 DIAGNOSIS — C9110 Chronic lymphocytic leukemia of B-cell type not having achieved remission: Secondary | ICD-10-CM

## 2022-07-15 DIAGNOSIS — C7802 Secondary malignant neoplasm of left lung: Secondary | ICD-10-CM | POA: Insufficient documentation

## 2022-07-15 LAB — COMPREHENSIVE METABOLIC PANEL
ALT: 17 U/L (ref 0–44)
AST: 19 U/L (ref 15–41)
Albumin: 4 g/dL (ref 3.5–5.0)
Alkaline Phosphatase: 54 U/L (ref 38–126)
Anion gap: 10 (ref 5–15)
BUN: 22 mg/dL (ref 8–23)
CO2: 28 mmol/L (ref 22–32)
Calcium: 9 mg/dL (ref 8.9–10.3)
Chloride: 100 mmol/L (ref 98–111)
Creatinine, Ser: 0.99 mg/dL (ref 0.61–1.24)
GFR, Estimated: >60 mL/min (ref >60)
Glucose, Bld: 125 mg/dL — ABNORMAL HIGH (ref 70–99)
Potassium: 3.5 mmol/L (ref 3.5–5.1)
Sodium: 138 mmol/L (ref 135–145)
Total Bilirubin: 0.6 mg/dL (ref 0.3–1.2)
Total Protein: 6.6 g/dL (ref 6.5–8.1)

## 2022-07-15 LAB — PSA: Prostatic Specific Antigen: 0.01 ng/mL (ref 0.00–4.00)

## 2022-07-15 LAB — CBC WITH DIFFERENTIAL/PLATELET
Abs Immature Granulocytes: 0.03 10*3/uL (ref 0.00–0.07)
Basophils Absolute: 0.1 10*3/uL (ref 0.0–0.1)
Basophils Relative: 0 %
Eosinophils Absolute: 0.1 10*3/uL (ref 0.0–0.5)
Eosinophils Relative: 1 %
HCT: 42 % (ref 39.0–52.0)
Hemoglobin: 14 g/dL (ref 13.0–17.0)
Immature Granulocytes: 0 %
Lymphocytes Relative: 58 %
Lymphs Abs: 6.9 10*3/uL — ABNORMAL HIGH (ref 0.7–4.0)
MCH: 30.2 pg (ref 26.0–34.0)
MCHC: 33.3 g/dL (ref 30.0–36.0)
MCV: 90.5 fL (ref 80.0–100.0)
Monocytes Absolute: 0.4 10*3/uL (ref 0.1–1.0)
Monocytes Relative: 3 %
Neutro Abs: 4.5 10*3/uL (ref 1.7–7.7)
Neutrophils Relative %: 38 %
Platelets: 192 10*3/uL (ref 150–400)
RBC: 4.64 MIL/uL (ref 4.22–5.81)
RDW: 14.5 % (ref 11.5–15.5)
WBC: 11.9 10*3/uL — ABNORMAL HIGH (ref 4.0–10.5)
nRBC: 0 % (ref 0.0–0.2)

## 2022-07-15 NOTE — Assessment & Plan Note (Signed)
Diagnosed on 07/08/2012.  Currently on watchful waiting

## 2022-07-15 NOTE — Assessment & Plan Note (Signed)
Stage IV castration sensitive prostate cancer with multiple radiotracer avid lung nodules on PSMA. Previous lung nodule biopsy results are consistent with malignant cells, non-small cell carcinoma, not enough tissue for further work-up for tissue origin. Nodules are avid on PSMA, likely metastatic prostate cancer lung involvement.  10/24/21 PMSA PET scan showed interval decrease of pulmonary nodules-treatment response.  Continue Adrogen deprivation therapy.-Lupron 30 mg every 4 months Labs are reviewed and discussed with patient. Continue Apalutamide 240mg  daily

## 2022-07-15 NOTE — Progress Notes (Signed)
[place]  Telephone:([phone] [phone]  Patient Care Team: Lajean Manes, MD as PCP - General (Internal Medicine) Frederik Pear, MD as Attending Physician (Orthopedic Surgery) Katheren Puller, RN as Oncology Nurse Navigator   Name of the patient: Brian Bean  [id number]  1940/07/26   Date of visit: 07/15/22  HPI: Patient is a 82 y.o. male with metastatic castration sensitive prostate cancer. Currently receiving ADT and adding on treatment with Erleada (apalutamide).   Reason for Consult: Oral chemotherapy follow-up for apalutamide therapy.   PAST MEDICAL HISTORY: Past Medical History:  Diagnosis Date   Anxiety    Arthritis    Cancer ([place])    skin lesion scalp   CLL (chronic lymphocytic leukemia) ([place]) 01/06/2013   CLL (chronic lymphocytic leukemia) ([place])    Depression    Head injury    fell from a horse   Hypertension    Lymphocytosis    PONV (postoperative nausea and vomiting)    extremely sick did not get sick after hip surgery in 2013   Prostate cancer [facility])     HEMATOLOGY/ONCOLOGY HISTORY:  Oncology History  CLL (chronic lymphocytic leukemia) ([place])  07/08/2012 Pathology Results   Accession #: [id number]  peripheral blood comfirmed CLL   2015 Initial Diagnosis   #History of prostate cancer, initially diagnosed in June 2015, Gleason score of 6, patient has been on active surveillance between 2015-2019, PSA gradually increasing up to 16.9.   06/24/2016 Pathology Results   CLL FISH showed 3.67% positive for deletion 13 q and 5% positive for p53 deletion   01/10/2021 Cancer Staging   Staging form: Chronic Lymphocytic Leukemia / Small Lymphocytic Lymphoma, AJCC 8th Edition - Clinical stage from 01/10/2021: Modified Rai Stage 0 (Modified Rai risk: Low, Lymphocytosis: Present, Adenopathy: Absent, Organomegaly: Absent, Anemia: Absent, Thrombocytopenia: Absent) - Signed by Heath Lark, MD on 01/10/2021 Stage prefix:  Initial diagnosis   07/02/2022 -  Chemotherapy   Started on apalutamide 240mg     Prostate cancer ([place])  2015 Initial Diagnosis   #History of prostate cancer, initially diagnosed in June 2015, Gleason score of 6, patient has been on active surveillance between 2015-2019, PSA gradually increasing up to 16.9.   06/24/2016 Initial Diagnosis   Prostate cancer ([place])   12/28/2020 Imaging   12/28/2020, PSMA at [facility] showed multiple bilateral radiotracer avid pulmonary nodules.  For example right upper lobe 1.2 x 1.4 cm and the left lower lobe 1.5 x 1.3 cm.  Prostate showed no focal radiotracer uptake to suggest local recurrence.  No PSMA avid pelvic, retroperitoneal, or mesenteric adenopathy.  Multiple pulmonary radiotracer avid nodules concerning for metastatic disease.  No osseous metastatic disease.  Severe three-vessel coronary artery calcifications   04/16/2021 Initial Biopsy   patient underwent biopsy via bronchoscopy. Lung R LL biopsy showed malignant cells consistent with non-small cell carcinoma. Lung RLL brushing showed malignant cells consistent with non-small cell carcinoma, Lung R UL brushing showed no malignant cells. Lung R UL biopsy showed malignant cells consistent with non-small cell carcinoma. Lung RUL lavage showed no malignant cells identified. Immunostains for TTF-1, Napsin A, p63 and CK5/6 were attempted but are noncontributory.  There is insufficient tumor tissue on the cellblock for additional studies.     05/2021 Tumor Marker   PSA 8.7   05/10/2021 Miscellaneous   05/10/2021, started on androgen deprivation therapy. Eligard 30 mg every 4 months   08/2021 Tumor Marker   PSA 0.2   10/08/2021 Cancer Staging   Staging form:  Prostate, AJCC 8th Edition - Clinical: Stage IVB (ycTX, cNX, cM1) - Signed by Earlie Server, MD on 10/08/2021 Stage prefix: Post-therapy   10/23/2021 Imaging   PSMA PET scan showed Small bilateral pulmonary metastases show intense radiopharmaceutical uptake,  which show slight decrease in size since prior outside PET-CT. No new or progressive metastatic disease.   11/08/2021 Imaging   CT chest wo contrast showed the scattered pulmonary nodules are mildly reduced in size compared to 04/16/2021. No new nodules identified.   02/07/2022 Procedure   Status post left pleural effusion thoracentesis -cytology not diagnostic of malignancy.     ALLERGIES:  has No Known Allergies.  MEDICATIONS:  Current Outpatient Medications  Medication Sig Dispense Refill   amLODipine (NORVASC) 5 MG tablet Take 5 mg by mouth 2 (two) times daily.      apalutamide (ERLEADA) 60 MG tablet Take 240 mg by mouth daily.     aspirin EC 81 MG tablet Take 81 mg by mouth daily. Swallow whole.     atorvastatin (LIPITOR) 20 MG tablet Take 20 mg by mouth daily.     carvedilol (COREG) 12.5 MG tablet Take 12.5 mg by mouth 2 (two) times daily with a meal.      fluorouracil (EFUDEX) 5 % cream Apply 1 application topically 2 (two) times daily as needed (precancerous spots). (Patient not taking: Reported on 05/26/2022)     FLUoxetine (PROZAC) 40 MG capsule Take 80 mg by mouth daily.     hydrochlorothiazide (HYDRODIURIL) 25 MG tablet Take 25 mg by mouth daily.      ipratropium (ATROVENT) 0.06 % nasal spray Place 2 sprays into both nostrils as needed for rhinitis.     ketoconazole (NIZORAL) 2 % shampoo Apply 1 application topically daily as needed for irritation.     lisinopril (PRINIVIL,ZESTRIL) 10 MG tablet Take 10 mg by mouth daily.     mirabegron ER (MYRBETRIQ) 25 MG TB24 tablet Take 25 mg by mouth daily.     naproxen sodium (ALEVE) 220 MG tablet Take 220 mg by mouth daily as needed (pain). (Patient not taking: Reported on 05/26/2022)     OLANZapine (ZYPREXA) 2.5 MG tablet Take 2.5 mg by mouth at bedtime. (Patient not taking: Reported on 05/26/2022)     tamsulosin (FLOMAX) 0.4 MG CAPS capsule Take 0.4 mg by mouth daily.     No current facility-administered medications for this visit.     VITAL SIGNS: There were no vitals taken for this visit. There were no vitals filed for this visit.  Estimated body mass index is 28.93 kg/m as calculated from the following:   Height as of 01/26/22: 6' (1.829 m).   Weight as of an earlier encounter on 07/15/22: 96.8 kg (213 lb 4.8 oz).  LABS: CBC:    Component Value Date/Time   WBC 11.9 (H) 07/15/2022 0952   HGB 14.0 07/15/2022 0952   HGB 14.6 05/09/2021 0936   HGB 14.7 06/24/2016 1013   HCT 42.0 07/15/2022 0952   HCT 43.8 06/24/2016 1013   PLT 192 07/15/2022 0952   PLT 148 (L) 05/09/2021 0936   PLT 157 06/24/2016 1013   MCV 90.5 07/15/2022 0952   MCV 91.0 06/24/2016 1013   NEUTROABS 4.5 07/15/2022 0952   NEUTROABS 5.1 06/24/2016 1013   LYMPHSABS 6.9 (H) 07/15/2022 0952   LYMPHSABS 12.0 (H) 06/24/2016 1013   MONOABS 0.4 07/15/2022 0952   MONOABS 0.7 06/24/2016 1013   EOSABS 0.1 07/15/2022 0952   EOSABS 0.1 06/24/2016 1013   BASOSABS  0.1 07/15/2022 0952   BASOSABS 0.1 06/24/2016 1013   Comprehensive Metabolic Panel:    Component Value Date/Time   NA 138 07/15/2022 0952   NA 136 06/24/2016 1014   K 3.5 07/15/2022 0952   K 3.9 06/24/2016 1014   CL 100 07/15/2022 0952   CL 105 10/06/2012 1047   CO2 28 07/15/2022 0952   CO2 24 06/24/2016 1014   BUN 22 07/15/2022 0952   BUN 17.8 06/24/2016 1014   CREATININE 0.99 07/15/2022 0952   CREATININE 0.93 05/09/2021 0936   CREATININE 0.9 06/24/2016 1014   GLUCOSE 125 (H) 07/15/2022 0952   GLUCOSE 104 06/24/2016 1014   GLUCOSE 80 10/06/2012 1047   CALCIUM 9.0 07/15/2022 0952   CALCIUM 9.2 06/24/2016 1014   AST 19 07/15/2022 0952   AST 20 05/09/2021 0936   AST 16 06/24/2016 1014   ALT 17 07/15/2022 0952   ALT 26 05/09/2021 0936   ALT 17 06/24/2016 1014   ALKPHOS 54 07/15/2022 0952   ALKPHOS 55 06/24/2016 1014   BILITOT 0.6 07/15/2022 0952   BILITOT 0.7 05/09/2021 0936   BILITOT 0.51 06/24/2016 1014   PROT 6.6 07/15/2022 0952   PROT 6.5 06/24/2016 1014   ALBUMIN  4.0 07/15/2022 0952   ALBUMIN 3.7 06/24/2016 1014     Present during today's visit: patient and his significant other  Assessment and Plan: Continue apalutamide 240mg  daily   Oral Chemotherapy Side Effect/Intolerance:  Fatigue: slight increase in fatigue but not as much as when he started in ADT inj No reported edema, nausea, diarrhea, or rash  Oral Chemotherapy Adherence: no missed doses reported No patient barriers to medication adherence identified.   New medications: None reported  Medication Access Issues: no issues, pt enrolled in mfg assistance program  Patient expressed understanding and was in agreement with this plan. He also understands that He can call clinic at any time with any questions, concerns, or complaints.   Follow-up plan: RTC in 4 weeks  Thank you for allowing me to participate in the care of this very pleasant patient.   Time Total: 15 mins  Visit consisted of counseling and education on dealing with issues of symptom management in the setting of serious and potentially life-threatening illness.Greater than 50%  of this time was spent counseling and coordinating care related to the above assessment and plan.  Signed by: Darl Pikes, PharmD, BCPS, Salley Slaughter, CPP Hematology/Oncology Clinical Pharmacist Practitioner [place] [phone]  07/15/2022 3:24 PM

## 2022-07-15 NOTE — Assessment & Plan Note (Signed)
Treatment plan as listed above.

## 2022-07-15 NOTE — Progress Notes (Signed)
Hematology/Oncology Progress note [phone] [phone]      REFERRING PROVIDER: Lajean Manes, MD   CHIEF COMPLAINTS/REASON FOR VISIT:  Prostate cancer  ASSESSMENT & PLAN:   Cancer Staging  CLL (chronic lymphocytic leukemia) ([place]) Staging form: Chronic Lymphocytic Leukemia / Small Lymphocytic Lymphoma, AJCC 8th Edition - Clinical stage from 01/10/2021: Modified Rai Stage 0 (Modified Rai risk: Low, Lymphocytosis: Present, Adenopathy: Absent, Organomegaly: Absent, Anemia: Absent, Thrombocytopenia: Absent) - Signed by Heath Lark, MD on 01/10/2021  Prostate cancer [facility]) Staging form: Prostate, AJCC 8th Edition - Clinical: Stage IVB (ycTX, cNX, cM1) - Signed by Earlie Server, MD on 10/08/2021   Prostate cancer ([place]) Stage IV castration sensitive prostate cancer with multiple radiotracer avid lung nodules on PSMA. Previous lung nodule biopsy results are consistent with malignant cells, non-small cell carcinoma, not enough tissue for further work-up for tissue origin. Nodules are avid on PSMA, likely metastatic prostate cancer lung involvement.  10/24/21 PMSA PET scan showed interval decrease of pulmonary nodules-treatment response.  Continue Adrogen deprivation therapy.-Lupron 30 mg every 4 months Labs are reviewed and discussed with patient. Continue Apalutamide 240mg  daily   CLL (chronic lymphocytic leukemia) ([place]) Diagnosed on 07/08/2012.  Currently on watchful waiting  Encounter for antineoplastic chemotherapy Treatment plan as listed above.  Orders Placed This Encounter  Procedures   CBC with Differential/Platelet    Standing Status:   Future    Standing Expiration Date:   07/15/2023   Comprehensive metabolic panel    Standing Status:   Future    Standing Expiration Date:   07/15/2023   PSA    Standing Status:   Future    Standing Expiration Date:   07/16/2023     Follow-up in 4 weeks. All questions were answered. The patient knows to call the clinic with  any problems questions or concerns.   Earlie Server, MD, PhD [facility] Hematology Oncology 07/15/2022    HISTORY OF PRESENTING ILLNESS:   Brian Bean is a  81 y.o.  male with PMH listed below was seen in consultation at the request of  Lajean Manes, MD  for evaluation of prostate cancer.  Patient's oncology care was previously with Dr. Zola Button.  Patient presents for second opinion and prefers to transfer care to our cancer center. I reviewed patient's previous oncology records.  Oncology History  CLL (chronic lymphocytic leukemia) ([place])  07/08/2012 Pathology Results   Accession #: [id number]  peripheral blood comfirmed CLL   2015 Initial Diagnosis   #History of prostate cancer, initially diagnosed in June 2015, Gleason score of 6, patient has been on active surveillance between 2015-2019, PSA gradually increasing up to 16.9.   06/24/2016 Pathology Results   CLL FISH showed 3.67% positive for deletion 13 q and 5% positive for p53 deletion   01/10/2021 Cancer Staging   Staging form: Chronic Lymphocytic Leukemia / Small Lymphocytic Lymphoma, AJCC 8th Edition - Clinical stage from 01/10/2021: Modified Rai Stage 0 (Modified Rai risk: Low, Lymphocytosis: Present, Adenopathy: Absent, Organomegaly: Absent, Anemia: Absent, Thrombocytopenia: Absent) - Signed by Heath Lark, MD on 01/10/2021 Stage prefix: Initial diagnosis   07/02/2022 -  Chemotherapy   Started on apalutamide 240mg     Prostate cancer ([place])  2015 Initial Diagnosis   #History of prostate cancer, initially diagnosed in June 2015, Gleason score of 6, patient has been on active surveillance between 2015-2019, PSA gradually increasing up to 16.9.   06/24/2016 Initial Diagnosis   Prostate cancer ([place])   12/28/2020 Imaging   12/28/2020, PSMA at  [facility] showed multiple bilateral radiotracer avid pulmonary nodules.  For example right upper lobe 1.2 x 1.4 cm and the left lower lobe 1.5 x 1.3 cm.  Prostate showed no focal radiotracer uptake  to suggest local recurrence.  No PSMA avid pelvic, retroperitoneal, or mesenteric adenopathy.  Multiple pulmonary radiotracer avid nodules concerning for metastatic disease.  No osseous metastatic disease.  Severe three-vessel coronary artery calcifications   04/16/2021 Initial Biopsy   patient underwent biopsy via bronchoscopy. Lung R LL biopsy showed malignant cells consistent with non-small cell carcinoma. Lung RLL brushing showed malignant cells consistent with non-small cell carcinoma, Lung R UL brushing showed no malignant cells. Lung R UL biopsy showed malignant cells consistent with non-small cell carcinoma. Lung RUL lavage showed no malignant cells identified. Immunostains for TTF-1, Napsin A, p63 and CK5/6 were attempted but are noncontributory.  There is insufficient tumor tissue on the cellblock for additional studies.     05/2021 Tumor Marker   PSA 8.7   05/10/2021 Miscellaneous   05/10/2021, started on androgen deprivation therapy. Eligard 30 mg every 4 months   08/2021 Tumor Marker   PSA 0.2   10/08/2021 Cancer Staging   Staging form: Prostate, AJCC 8th Edition - Clinical: Stage IVB (ycTX, cNX, cM1) - Signed by Earlie Server, MD on 10/08/2021 Stage prefix: Post-therapy   10/23/2021 Imaging   PSMA PET scan showed Small bilateral pulmonary metastases show intense radiopharmaceutical uptake, which show slight decrease in size since prior outside PET-CT. No new or progressive metastatic disease.   11/08/2021 Imaging   CT chest wo contrast showed the scattered pulmonary nodules are mildly reduced in size compared to 04/16/2021. No new nodules identified.   02/07/2022 Procedure   Status post left pleural effusion thoracentesis -cytology not diagnostic of malignancy.    INTERVAL HISTORY Brian Bean is a 82 y.o. male who has above history reviewed by me today presents for follow up visit for prostate cancer. Patient reports feeling well.   07/02/2022 apalutamide 240mg .  Overall he  tolerates treatment with no significant side effects.  Review of Systems  Constitutional:  Positive for fatigue. Negative for appetite change, chills, fever and unexpected weight change.  HENT:   Negative for hearing loss and voice change.   Eyes:  Negative for eye problems and icterus.  Respiratory:  Negative for chest tightness, cough and shortness of breath.   Cardiovascular:  Negative for chest pain and leg swelling.  Gastrointestinal:  Negative for abdominal distention and abdominal pain.  Endocrine: Positive for hot flashes.  Genitourinary:  Negative for difficulty urinating, dysuria and frequency.   Musculoskeletal:  Negative for arthralgias.  Skin:  Negative for itching and rash.  Neurological:  Negative for light-headedness and numbness.  Hematological:  Negative for adenopathy. Does not bruise/bleed easily.  Psychiatric/Behavioral:  Negative for confusion.     MEDICAL HISTORY:  Past Medical History:  Diagnosis Date   Anxiety    Arthritis    Cancer ([place])    skin lesion scalp   CLL (chronic lymphocytic leukemia) ([place]) 01/06/2013   CLL (chronic lymphocytic leukemia) ([place])    Depression    Head injury    fell from a horse   Hypertension    Lymphocytosis    PONV (postoperative nausea and vomiting)    extremely sick did not get sick after hip surgery in 2013   Prostate cancer [facility])     SURGICAL HISTORY: Past Surgical History:  Procedure Laterality Date   APPENDECTOMY     BRONCHIAL BIOPSY  04/16/2021   Procedure: BRONCHIAL BIOPSIES;  Surgeon: Garner Nash, DO;  Location: [place] [facility];  Service: Pulmonary;;   BRONCHIAL BRUSHINGS  04/16/2021   Procedure: BRONCHIAL BRUSHINGS;  Surgeon: Garner Nash, DO;  Location: [place] [facility];  Service: Pulmonary;;   BRONCHIAL NEEDLE ASPIRATION BIOPSY  04/16/2021   Procedure: BRONCHIAL NEEDLE ASPIRATION BIOPSIES;  Surgeon: Garner Nash, DO;  Location: [place] [facility];  Service: Pulmonary;;   BRONCHIAL WASHINGS  04/16/2021    Procedure: BRONCHIAL WASHINGS;  Surgeon: Garner Nash, DO;  Location: [place] [facility];  Service: Pulmonary;;   CATARACT EXTRACTION, BILATERAL     CERVICAL DISC SURGERY  00   COLONOSCOPY  2011   [place]     rt ankle   HERNIA REPAIR     rt and  undistended testicle   PROSTATE BIOPSY     REVERSE SHOULDER ARTHROPLASTY Right 05/01/2020   Procedure: REVERSE SHOULDER ARTHROPLASTY;  Surgeon: Corky Mull, MD;  Location: [facility];  Service: Orthopedics;  Laterality: Right;   rt hip  12   pinned   TOTAL HIP ARTHROPLASTY  05/03/2012   Procedure: TOTAL HIP ARTHROPLASTY;  Surgeon: Kerin Salen, MD;  Location: [place];  Service: Orthopedics;  Laterality: Right;   VIDEO BRONCHOSCOPY WITH ENDOBRONCHIAL NAVIGATION Right 04/16/2021   Procedure: VIDEO BRONCHOSCOPY WITH ENDOBRONCHIAL NAVIGATION;  Surgeon: Garner Nash, DO;  Location: [place];  Service: Pulmonary;  Laterality: Right;  ION w/ CIOS   VIDEO BRONCHOSCOPY WITH RADIAL ENDOBRONCHIAL ULTRASOUND  04/16/2021   Procedure: RADIAL ENDOBRONCHIAL ULTRASOUND;  Surgeon: Garner Nash, DO;  Location: [facility];  Service: Pulmonary;;    SOCIAL HISTORY: Social History   Socioeconomic History   Marital status: Divorced    Spouse name: Not on file   Number of children: 1   Years of education: Not on file   Highest education level: Not on file  Occupational History    Comment: retired Copywriter, advertising ([employer]  Tobacco Use   Smoking status: Former    Packs/day: 0.50    Years: 10.00    Total pack years: 5.00    Types: Cigarettes    Quit date: 04/28/1977    Years since quitting: 45.2   Smokeless tobacco: Never   Tobacco comments:    10 oz alcohol wkly  Vaping Use   Vaping Use: Never used  Substance and Sexual Activity   Alcohol use: Yes    Alcohol/week: 21.0 standard drinks of alcohol    Types: 5 Glasses of wine, 16 Shots of liquor per week    Comment: 14 ounces a week   Drug use: No   Sexual  activity: Yes  Other Topics Concern   Not on file  Social History Narrative   Retired.   Significant other: Nicanor Bake   One son, Saralyn Pilar   Social Determinants of Health   Financial Resource Strain: Not on [employer] Insecurity: Not on file  Transportation Needs: Not on file  Physical Activity: Not on file  Stress: Not on file  Social Connections: Not on file  Intimate Partner Violence: Not on file    FAMILY HISTORY: Family History  Problem Relation Age of Onset   Heart disease Mother    Cancer Father        unk type of cancer    ALLERGIES:  has No Known Allergies.  MEDICATIONS:  Current Outpatient Medications  Medication Sig Dispense Refill   amLODipine (NORVASC) 5 MG tablet Take 5 mg  by mouth 2 (two) times daily.      apalutamide (ERLEADA) 60 MG tablet Take 240 mg by mouth daily.     aspirin EC 81 MG tablet Take 81 mg by mouth daily. Swallow whole.     atorvastatin (LIPITOR) 20 MG tablet Take 20 mg by mouth daily.     carvedilol (COREG) 12.5 MG tablet Take 12.5 mg by mouth 2 (two) times daily with a meal.      FLUoxetine (PROZAC) 40 MG capsule Take 80 mg by mouth daily.     hydrochlorothiazide (HYDRODIURIL) 25 MG tablet Take 25 mg by mouth daily.      ipratropium (ATROVENT) 0.06 % nasal spray Place 2 sprays into both nostrils as needed for rhinitis.     ketoconazole (NIZORAL) 2 % shampoo Apply 1 application topically daily as needed for irritation.     lisinopril (PRINIVIL,ZESTRIL) 10 MG tablet Take 10 mg by mouth daily.     mirabegron ER (MYRBETRIQ) 25 MG TB24 tablet Take 25 mg by mouth daily.     tamsulosin (FLOMAX) 0.4 MG CAPS capsule Take 0.4 mg by mouth daily.     fluorouracil (EFUDEX) 5 % cream Apply 1 application topically 2 (two) times daily as needed (precancerous spots). (Patient not taking: Reported on 05/26/2022)     naproxen sodium (ALEVE) 220 MG tablet Take 220 mg by mouth daily as needed (pain). (Patient not taking: Reported on 05/26/2022)      OLANZapine (ZYPREXA) 2.5 MG tablet Take 2.5 mg by mouth at bedtime. (Patient not taking: Reported on 05/26/2022)     potassium chloride SA (KLOR-CON M) 20 MEQ tablet Take 1 tablet (20 mEq total) by mouth daily. (Patient not taking: Reported on 07/15/2022) 7 tablet 0   No current facility-administered medications for this visit.     PHYSICAL EXAMINATION: ECOG PERFORMANCE STATUS: 1 - Symptomatic but completely ambulatory Vitals:   07/15/22 1009  BP: 132/70  Pulse: 64  Temp: 97.9 F (36.6 C)  SpO2: 99%   Filed Weights   07/15/22 1009  Weight: 213 lb 4.8 oz (96.8 kg)    Physical Exam Constitutional:      General: He is not in acute distress. HENT:     Head: Normocephalic and atraumatic.  Eyes:     General: No scleral icterus. Cardiovascular:     Rate and Rhythm: Normal rate and regular rhythm.     Heart sounds: Normal heart sounds.  Pulmonary:     Effort: Pulmonary effort is normal. No respiratory distress.     Breath sounds: No wheezing.  Abdominal:     General: Bowel sounds are normal. There is no distension.     Palpations: Abdomen is soft.  Musculoskeletal:        General: No deformity. Normal range of motion.     Cervical back: Normal range of motion and neck supple.  Skin:    General: Skin is warm and dry.     Findings: No erythema or rash.  Neurological:     Mental Status: He is alert and oriented to person, place, and time. Mental status is at baseline.     Cranial Nerves: No cranial nerve deficit.     Coordination: Coordination normal.  Psychiatric:        Mood and Affect: Mood normal.     LABORATORY DATA:  I have reviewed the data as listed Lab Results  Component Value Date   WBC 11.9 (H) 07/15/2022   HGB 14.0 07/15/2022   HCT 42.0  07/15/2022   MCV 90.5 07/15/2022   PLT 192 07/15/2022   Recent Labs    01/26/22 1356 01/27/22 0519 05/23/22 0935 07/15/22 0952  NA 139 138 136 138  K 3.5 3.5 3.0* 3.5  CL 101 102 99 100  CO2 28 30 26 28   GLUCOSE  144* 136* 113* 125*  BUN 24* 24* 28* 22  CREATININE 0.66 0.71 0.86 0.99  CALCIUM 9.4 8.8* 8.8* 9.0  GFRNONAA >60 >60 >60 >60  PROT 7.0  --  6.5 6.6  ALBUMIN 4.2  --  3.7 4.0  AST 20  --  18 19  ALT 21  --  22 17  ALKPHOS 49  --  50 54  BILITOT 1.2  --  0.8 0.6  BILIDIR 0.2  --   --   --   IBILI 1.0*  --   --   --     Iron/TIBC/Ferritin/ %Sat No results found for: "IRON", "TIBC", "FERRITIN", "IRONPCTSAT"    RADIOGRAPHIC STUDIES: I have personally reviewed the radiological images as listed and agreed with the findings in the report. No results found.    cc Lajean Manes, MD

## 2022-07-17 LAB — COMP PANEL: LEUKEMIA/LYMPHOMA: Immunophenotypic Profile: 42

## 2022-07-24 DIAGNOSIS — C7800 Secondary malignant neoplasm of unspecified lung: Secondary | ICD-10-CM | POA: Diagnosis not present

## 2022-08-13 ENCOUNTER — Inpatient Hospital Stay: Payer: Medicare Other | Attending: Oncology

## 2022-08-13 ENCOUNTER — Encounter: Payer: Self-pay | Admitting: Oncology

## 2022-08-13 ENCOUNTER — Inpatient Hospital Stay: Payer: Medicare Other | Admitting: Oncology

## 2022-08-13 VITALS — BP 132/71 | HR 65 | Temp 97.0°F | Wt 212.6 lb

## 2022-08-13 DIAGNOSIS — C7802 Secondary malignant neoplasm of left lung: Secondary | ICD-10-CM | POA: Diagnosis not present

## 2022-08-13 DIAGNOSIS — C7801 Secondary malignant neoplasm of right lung: Secondary | ICD-10-CM | POA: Diagnosis not present

## 2022-08-13 DIAGNOSIS — Z809 Family history of malignant neoplasm, unspecified: Secondary | ICD-10-CM | POA: Diagnosis not present

## 2022-08-13 DIAGNOSIS — Z79899 Other long term (current) drug therapy: Secondary | ICD-10-CM | POA: Diagnosis not present

## 2022-08-13 DIAGNOSIS — Z87891 Personal history of nicotine dependence: Secondary | ICD-10-CM | POA: Insufficient documentation

## 2022-08-13 DIAGNOSIS — C61 Malignant neoplasm of prostate: Secondary | ICD-10-CM | POA: Insufficient documentation

## 2022-08-13 DIAGNOSIS — Z8249 Family history of ischemic heart disease and other diseases of the circulatory system: Secondary | ICD-10-CM | POA: Insufficient documentation

## 2022-08-13 DIAGNOSIS — Z5111 Encounter for antineoplastic chemotherapy: Secondary | ICD-10-CM | POA: Diagnosis not present

## 2022-08-13 DIAGNOSIS — C9110 Chronic lymphocytic leukemia of B-cell type not having achieved remission: Secondary | ICD-10-CM | POA: Diagnosis not present

## 2022-08-13 DIAGNOSIS — I10 Essential (primary) hypertension: Secondary | ICD-10-CM | POA: Insufficient documentation

## 2022-08-13 DIAGNOSIS — R5383 Other fatigue: Secondary | ICD-10-CM | POA: Diagnosis not present

## 2022-08-13 DIAGNOSIS — Z7289 Other problems related to lifestyle: Secondary | ICD-10-CM | POA: Insufficient documentation

## 2022-08-13 DIAGNOSIS — Z9049 Acquired absence of other specified parts of digestive tract: Secondary | ICD-10-CM | POA: Insufficient documentation

## 2022-08-13 LAB — CBC WITH DIFFERENTIAL/PLATELET
Abs Immature Granulocytes: 0.03 10*3/uL (ref 0.00–0.07)
Basophils Absolute: 0 10*3/uL (ref 0.0–0.1)
Basophils Relative: 1 %
Eosinophils Absolute: 0.1 10*3/uL (ref 0.0–0.5)
Eosinophils Relative: 2 %
HCT: 42.4 % (ref 39.0–52.0)
Hemoglobin: 14.3 g/dL (ref 13.0–17.0)
Immature Granulocytes: 0 %
Lymphocytes Relative: 47 %
Lymphs Abs: 4.2 10*3/uL — ABNORMAL HIGH (ref 0.7–4.0)
MCH: 30.5 pg (ref 26.0–34.0)
MCHC: 33.7 g/dL (ref 30.0–36.0)
MCV: 90.4 fL (ref 80.0–100.0)
Monocytes Absolute: 0.5 10*3/uL (ref 0.1–1.0)
Monocytes Relative: 6 %
Neutro Abs: 3.8 10*3/uL (ref 1.7–7.7)
Neutrophils Relative %: 44 %
Platelets: 166 10*3/uL (ref 150–400)
RBC: 4.69 MIL/uL (ref 4.22–5.81)
RDW: 14.1 % (ref 11.5–15.5)
WBC: 8.7 10*3/uL (ref 4.0–10.5)
nRBC: 0 % (ref 0.0–0.2)

## 2022-08-13 LAB — COMPREHENSIVE METABOLIC PANEL
ALT: 19 U/L (ref 0–44)
AST: 21 U/L (ref 15–41)
Albumin: 4 g/dL (ref 3.5–5.0)
Alkaline Phosphatase: 50 U/L (ref 38–126)
Anion gap: 11 (ref 5–15)
BUN: 22 mg/dL (ref 8–23)
CO2: 27 mmol/L (ref 22–32)
Calcium: 9.1 mg/dL (ref 8.9–10.3)
Chloride: 101 mmol/L (ref 98–111)
Creatinine, Ser: 0.9 mg/dL (ref 0.61–1.24)
GFR, Estimated: >60 mL/min (ref >60)
Glucose, Bld: 116 mg/dL — ABNORMAL HIGH (ref 70–99)
Potassium: 3.5 mmol/L (ref 3.5–5.1)
Sodium: 139 mmol/L (ref 135–145)
Total Bilirubin: 0.6 mg/dL (ref 0.3–1.2)
Total Protein: 7 g/dL (ref 6.5–8.1)

## 2022-08-13 LAB — PSA: Prostatic Specific Antigen: 0.01 ng/mL (ref 0.00–4.00)

## 2022-08-13 NOTE — Assessment & Plan Note (Signed)
Diagnosed on 07/08/2012.  Currently on watchful waiting

## 2022-08-13 NOTE — Assessment & Plan Note (Addendum)
Stage IV castration sensitive prostate cancer with multiple radiotracer avid lung nodules on PSMA. Previous lung nodule biopsy results are consistent with malignant cells, non-small cell carcinoma, not enough tissue for further work-up for tissue origin. Nodules are avid on PSMA, likely metastatic prostate cancer lung involvement.  10/24/21 PMSA PET scan showed interval decrease of pulmonary nodules-treatment response.  Continue Adrogen deprivation therapy.- next due Lupron '45mg'$  Q6 months - April 2024 Labs are reviewed and discussed with patient. Continue Apalutamide '240mg'$  daily

## 2022-08-13 NOTE — Progress Notes (Signed)
Hematology/Oncology Progress note [phone] [phone]    CHIEF COMPLAINTS/REASON FOR VISIT:  Prostate cancer  ASSESSMENT & PLAN:   Cancer Staging  CLL (chronic lymphocytic leukemia) ([place]) Staging form: Chronic Lymphocytic Leukemia / Small Lymphocytic Lymphoma, AJCC 8th Edition - Clinical stage from 01/10/2021: Modified Rai Stage 0 (Modified Rai risk: Low, Lymphocytosis: Present, Adenopathy: Absent, Organomegaly: Absent, Anemia: Absent, Thrombocytopenia: Absent) - Signed by Heath Lark, MD on 01/10/2021  Prostate cancer [facility]) Staging form: Prostate, AJCC 8th Edition - Clinical: Stage IVB (ycTX, cNX, cM1) - Signed by Earlie Server, MD on 10/08/2021   Prostate cancer ([place]) Stage IV castration sensitive prostate cancer with multiple radiotracer avid lung nodules on PSMA. Previous lung nodule biopsy results are consistent with malignant cells, non-small cell carcinoma, not enough tissue for further work-up for tissue origin. Nodules are avid on PSMA, likely metastatic prostate cancer lung involvement.  10/24/21 PMSA PET scan showed interval decrease of pulmonary nodules-treatment response.  Continue Adrogen deprivation therapy.- next due Lupron '45mg'$  Q6 months - April 2024 Labs are reviewed and discussed with patient. Continue Apalutamide '240mg'$  daily   CLL (chronic lymphocytic leukemia) ([place]) Diagnosed on 07/08/2012.  Currently on watchful waiting  Encounter for antineoplastic chemotherapy Treatment plan as listed above.  Orders Placed This Encounter  Procedures   CBC with Differential ([place] Only)    Standing Status:   Future    Standing Expiration Date:   08/13/2023   CMP ([place] only)    Standing Status:   Future    Standing Expiration Date:   08/13/2023   PSA    Standing Status:   Future    Standing Expiration Date:   08/13/2023     Follow-up in 6 weeks. All questions were answered. The patient knows to call the clinic with any problems questions or  concerns.   Earlie Server, MD, PhD [facility] Hematology Oncology 08/13/2022    HISTORY OF PRESENTING ILLNESS:   Brian Bean is a  82 y.o.  male with PMH listed below was seen in consultation at the request of  Lajean Manes, MD  for evaluation of prostate cancer.  Patient's oncology care was previously with Dr. Zola Button.  Patient presents for second opinion and prefers to transfer care to our cancer center. I reviewed patient's previous oncology records.  Oncology History  CLL (chronic lymphocytic leukemia) ([place])  07/08/2012 Pathology Results   Accession #: [id number]  peripheral blood comfirmed CLL   2015 Initial Diagnosis   #History of prostate cancer, initially diagnosed in June 2015, Gleason score of 6, patient has been on active surveillance between 2015-2019, PSA gradually increasing up to 16.9.   06/24/2016 Pathology Results   CLL FISH showed 3.67% positive for deletion 13 q and 5% positive for p53 deletion   01/10/2021 Cancer Staging   Staging form: Chronic Lymphocytic Leukemia / Small Lymphocytic Lymphoma, AJCC 8th Edition - Clinical stage from 01/10/2021: Modified Rai Stage 0 (Modified Rai risk: Low, Lymphocytosis: Present, Adenopathy: Absent, Organomegaly: Absent, Anemia: Absent, Thrombocytopenia: Absent) - Signed by Heath Lark, MD on 01/10/2021 Stage prefix: Initial diagnosis   07/02/2022 -  Chemotherapy   Started on apalutamide '240mg'$     Prostate cancer ([place])  2015 Initial Diagnosis   #History of prostate cancer, initially diagnosed in June 2015, Gleason score of 6, patient has been on active surveillance between 2015-2019, PSA gradually increasing up to 16.9.   06/24/2016 Initial Diagnosis   Prostate cancer ([place])   12/28/2020 Imaging   12/28/2020, PSMA at [facility]  showed multiple bilateral radiotracer avid pulmonary nodules.  For example right upper lobe 1.2 x 1.4 cm and the left lower lobe 1.5 x 1.3 cm.  Prostate showed no focal radiotracer uptake to suggest local  recurrence.  No PSMA avid pelvic, retroperitoneal, or mesenteric adenopathy.  Multiple pulmonary radiotracer avid nodules concerning for metastatic disease.  No osseous metastatic disease.  Severe three-vessel coronary artery calcifications   04/16/2021 Initial Biopsy   patient underwent biopsy via bronchoscopy. Lung R LL biopsy showed malignant cells consistent with non-small cell carcinoma. Lung RLL brushing showed malignant cells consistent with non-small cell carcinoma, Lung R UL brushing showed no malignant cells. Lung R UL biopsy showed malignant cells consistent with non-small cell carcinoma. Lung RUL lavage showed no malignant cells identified. Immunostains for TTF-1, Napsin A, p63 and CK5/6 were attempted but are noncontributory.  There is insufficient tumor tissue on the cellblock for additional studies.     05/2021 Tumor Marker   PSA 8.7   05/10/2021 Miscellaneous   05/10/2021, started on androgen deprivation therapy. Eligard 30 mg every 4 months   08/2021 Tumor Marker   PSA 0.2   10/08/2021 Cancer Staging   Staging form: Prostate, AJCC 8th Edition - Clinical: Stage IVB (ycTX, cNX, cM1) - Signed by Earlie Server, MD on 10/08/2021 Stage prefix: Post-therapy   10/23/2021 Imaging   PSMA PET scan showed Small bilateral pulmonary metastases show intense radiopharmaceutical uptake, which show slight decrease in size since prior outside PET-CT. No new or progressive metastatic disease.   11/08/2021 Imaging   CT chest wo contrast showed the scattered pulmonary nodules are mildly reduced in size compared to 04/16/2021. No new nodules identified.   02/07/2022 Procedure   Status post left pleural effusion thoracentesis -cytology not diagnostic of malignancy.    INTERVAL HISTORY Brian Bean is a 82 y.o. male who has above history reviewed by me today presents for follow up visit for prostate cancer. Patient reports feeling well.   07/02/2022 apalutamide '240mg'$ .  Overall he tolerates treatment  with no significant side effects. Denies SOB, cough. + fatigue.   Review of Systems  Constitutional:  Positive for fatigue. Negative for appetite change, chills, fever and unexpected weight change.  HENT:   Negative for hearing loss and voice change.   Eyes:  Negative for eye problems and icterus.  Respiratory:  Negative for chest tightness, cough and shortness of breath.   Cardiovascular:  Negative for chest pain and leg swelling.  Gastrointestinal:  Negative for abdominal distention and abdominal pain.  Endocrine: Positive for hot flashes.  Genitourinary:  Negative for difficulty urinating, dysuria and frequency.   Musculoskeletal:  Negative for arthralgias.  Skin:  Negative for itching and rash.  Neurological:  Negative for light-headedness and numbness.  Hematological:  Negative for adenopathy. Does not bruise/bleed easily.  Psychiatric/Behavioral:  Negative for confusion.     MEDICAL HISTORY:  Past Medical History:  Diagnosis Date   Anxiety    Arthritis    Cancer ([place])    skin lesion scalp   CLL (chronic lymphocytic leukemia) ([place]) 01/06/2013   CLL (chronic lymphocytic leukemia) ([place])    Depression    Head injury    fell from a horse   Hypertension    Lymphocytosis    PONV (postoperative nausea and vomiting)    extremely sick did not get sick after hip surgery in 2013   Prostate cancer [facility])     SURGICAL HISTORY: Past Surgical History:  Procedure Laterality Date   APPENDECTOMY  BRONCHIAL BIOPSY  04/16/2021   Procedure: BRONCHIAL BIOPSIES;  Surgeon: Garner Nash, DO;  Location: [place] [facility];  Service: Pulmonary;;   BRONCHIAL BRUSHINGS  04/16/2021   Procedure: BRONCHIAL BRUSHINGS;  Surgeon: Garner Nash, DO;  Location: [facility];  Service: Pulmonary;;   BRONCHIAL NEEDLE ASPIRATION BIOPSY  04/16/2021   Procedure: BRONCHIAL NEEDLE ASPIRATION BIOPSIES;  Surgeon: Garner Nash, DO;  Location: [facility];  Service: Pulmonary;;   BRONCHIAL WASHINGS   04/16/2021   Procedure: BRONCHIAL WASHINGS;  Surgeon: Garner Nash, DO;  Location: [facility];  Service: Pulmonary;;   CATARACT EXTRACTION, BILATERAL     CERVICAL DISC SURGERY  00   COLONOSCOPY  2011   [place]     rt ankle   HERNIA REPAIR     rt and  undistended testicle   PROSTATE BIOPSY     REVERSE SHOULDER ARTHROPLASTY Right 05/01/2020   Procedure: REVERSE SHOULDER ARTHROPLASTY;  Surgeon: Corky Mull, MD;  Location: [facility];  Service: Orthopedics;  Laterality: Right;   rt hip  12   pinned   TOTAL HIP ARTHROPLASTY  05/03/2012   Procedure: TOTAL HIP ARTHROPLASTY;  Surgeon: Kerin Salen, MD;  Location: [facility];  Service: Orthopedics;  Laterality: Right;   VIDEO BRONCHOSCOPY WITH ENDOBRONCHIAL NAVIGATION Right 04/16/2021   Procedure: VIDEO BRONCHOSCOPY WITH ENDOBRONCHIAL NAVIGATION;  Surgeon: Garner Nash, DO;  Location: [place];  Service: Pulmonary;  Laterality: Right;  ION w/ CIOS   VIDEO BRONCHOSCOPY WITH RADIAL ENDOBRONCHIAL ULTRASOUND  04/16/2021   Procedure: RADIAL ENDOBRONCHIAL ULTRASOUND;  Surgeon: Garner Nash, DO;  Location: [facility];  Service: Pulmonary;;    SOCIAL HISTORY: Social History   Socioeconomic History   Marital status: Divorced    Spouse name: Not on file   Number of children: 1   Years of education: Not on file   Highest education level: Not on file  Occupational History    Comment: retired Copywriter, advertising ([employer]  Tobacco Use   Smoking status: Former    Packs/day: 0.50    Years: 10.00    Total pack years: 5.00    Types: Cigarettes    Quit date: 04/28/1977    Years since quitting: 45.3   Smokeless tobacco: Never   Tobacco comments:    10 oz alcohol wkly  Vaping Use   Vaping Use: Never used  Substance and Sexual Activity   Alcohol use: Yes    Alcohol/week: 21.0 standard drinks of alcohol    Types: 5 Glasses of wine, 16 Shots of liquor per week    Comment: 14 ounces a week   Drug use: No    Sexual activity: Yes  Other Topics Concern   Not on file  Social History Narrative   Retired.   Significant other: Nicanor Bake   One son, Saralyn Pilar   Social Determinants of Health   Financial Resource Strain: Not on [employer] Insecurity: Not on file  Transportation Needs: Not on file  Physical Activity: Not on file  Stress: Not on file  Social Connections: Not on file  Intimate Partner Violence: Not on file    FAMILY HISTORY: Family History  Problem Relation Age of Onset   Heart disease Mother    Cancer Father        unk type of cancer    ALLERGIES:  has No Known Allergies.  MEDICATIONS:  Current Outpatient Medications  Medication Sig Dispense Refill   amLODipine (NORVASC) 5 MG tablet  Take 5 mg by mouth 2 (two) times daily.      apalutamide (ERLEADA) 60 MG tablet Take 240 mg by mouth daily.     aspirin EC 81 MG tablet Take 81 mg by mouth daily. Swallow whole.     atorvastatin (LIPITOR) 20 MG tablet Take 20 mg by mouth daily.     carvedilol (COREG) 12.5 MG tablet Take 12.5 mg by mouth 2 (two) times daily with a meal.      hydrochlorothiazide (HYDRODIURIL) 25 MG tablet Take 25 mg by mouth daily.      ipratropium (ATROVENT) 0.06 % nasal spray Place 2 sprays into both nostrils as needed for rhinitis.     ketoconazole (NIZORAL) 2 % shampoo Apply 1 application topically daily as needed for irritation.     lisinopril (PRINIVIL,ZESTRIL) 10 MG tablet Take 10 mg by mouth daily.     tamsulosin (FLOMAX) 0.4 MG CAPS capsule Take 0.4 mg by mouth daily.     fluorouracil (EFUDEX) 5 % cream Apply 1 application topically 2 (two) times daily as needed (precancerous spots). (Patient not taking: Reported on 05/26/2022)     FLUoxetine (PROZAC) 40 MG capsule Take 80 mg by mouth daily. (Patient not taking: Reported on 08/13/2022)     mirabegron ER (MYRBETRIQ) 25 MG TB24 tablet Take 25 mg by mouth daily. (Patient not taking: Reported on 08/13/2022)     naproxen sodium (ALEVE) 220 MG tablet Take 220 mg  by mouth daily as needed (pain). (Patient not taking: Reported on 05/26/2022)     OLANZapine (ZYPREXA) 2.5 MG tablet Take 2.5 mg by mouth at bedtime. (Patient not taking: Reported on 05/26/2022)     No current facility-administered medications for this visit.     PHYSICAL EXAMINATION: ECOG PERFORMANCE STATUS: 1 - Symptomatic but completely ambulatory Vitals:   08/13/22 1036  BP: 132/71  Pulse: 65  Temp: (!) 97 F (36.1 C)  SpO2: 98%   Filed Weights   08/13/22 1036  Weight: 212 lb 9.6 oz (96.4 kg)    Physical Exam Constitutional:      General: He is not in acute distress. HENT:     Head: Normocephalic and atraumatic.  Eyes:     General: No scleral icterus. Cardiovascular:     Rate and Rhythm: Normal rate and regular rhythm.     Heart sounds: Normal heart sounds.  Pulmonary:     Effort: Pulmonary effort is normal. No respiratory distress.     Breath sounds: No wheezing.  Abdominal:     General: Bowel sounds are normal. There is no distension.     Palpations: Abdomen is soft.  Musculoskeletal:        General: No deformity. Normal range of motion.     Cervical back: Normal range of motion and neck supple.  Skin:    General: Skin is warm and dry.     Findings: No erythema or rash.  Neurological:     Mental Status: He is alert and oriented to person, place, and time. Mental status is at baseline.     Cranial Nerves: No cranial nerve deficit.     Coordination: Coordination normal.  Psychiatric:        Mood and Affect: Mood normal.     LABORATORY DATA:  I have reviewed the data as listed Lab Results  Component Value Date   WBC 8.7 08/13/2022   HGB 14.3 08/13/2022   HCT 42.4 08/13/2022   MCV 90.4 08/13/2022   PLT 166 08/13/2022  Recent Labs    01/26/22 1356 01/27/22 0519 05/23/22 0935 07/15/22 0952 08/13/22 1023  NA 139   < > 136 138 139  K 3.5   < > 3.0* 3.5 3.5  CL 101   < > 99 100 101  CO2 28   < > '26 28 27  '$ GLUCOSE 144*   < > 113* 125* 116*  BUN  24*   < > 28* 22 22  CREATININE 0.66   < > 0.86 0.99 0.90  CALCIUM 9.4   < > 8.8* 9.0 9.1  GFRNONAA >60   < > >60 >60 >60  PROT 7.0  --  6.5 6.6 7.0  ALBUMIN 4.2  --  3.7 4.0 4.0  AST 20  --  '18 19 21  '$ ALT 21  --  '22 17 19  '$ ALKPHOS 49  --  50 54 50  BILITOT 1.2  --  0.8 0.6 0.6  BILIDIR 0.2  --   --   --   --   IBILI 1.0*  --   --   --   --    < > = values in this interval not displayed.    Iron/TIBC/Ferritin/ %Sat No results found for: "IRON", "TIBC", "FERRITIN", "IRONPCTSAT"    RADIOGRAPHIC STUDIES: I have personally reviewed the radiological images as listed and agreed with the findings in the report. No results found.    cc Lajean Manes, MD

## 2022-08-13 NOTE — Assessment & Plan Note (Signed)
Treatment plan as listed above.

## 2022-08-21 DIAGNOSIS — J3089 Other allergic rhinitis: Secondary | ICD-10-CM | POA: Diagnosis not present

## 2022-08-21 DIAGNOSIS — R739 Hyperglycemia, unspecified: Secondary | ICD-10-CM | POA: Diagnosis not present

## 2022-08-21 DIAGNOSIS — I10 Essential (primary) hypertension: Secondary | ICD-10-CM | POA: Diagnosis not present

## 2022-08-21 DIAGNOSIS — Z0000 Encounter for general adult medical examination without abnormal findings: Secondary | ICD-10-CM | POA: Diagnosis not present

## 2022-08-21 DIAGNOSIS — R5382 Chronic fatigue, unspecified: Secondary | ICD-10-CM | POA: Diagnosis not present

## 2022-08-21 DIAGNOSIS — E7800 Pure hypercholesterolemia, unspecified: Secondary | ICD-10-CM | POA: Diagnosis not present

## 2022-08-21 DIAGNOSIS — Z79899 Other long term (current) drug therapy: Secondary | ICD-10-CM | POA: Diagnosis not present

## 2022-08-21 DIAGNOSIS — T148XXA Other injury of unspecified body region, initial encounter: Secondary | ICD-10-CM | POA: Diagnosis not present

## 2022-08-21 DIAGNOSIS — C9110 Chronic lymphocytic leukemia of B-cell type not having achieved remission: Secondary | ICD-10-CM | POA: Diagnosis not present

## 2022-08-21 DIAGNOSIS — R413 Other amnesia: Secondary | ICD-10-CM | POA: Diagnosis not present

## 2022-08-21 DIAGNOSIS — C7800 Secondary malignant neoplasm of unspecified lung: Secondary | ICD-10-CM | POA: Diagnosis not present

## 2022-09-15 DIAGNOSIS — R350 Frequency of micturition: Secondary | ICD-10-CM | POA: Diagnosis not present

## 2022-09-17 DIAGNOSIS — J90 Pleural effusion, not elsewhere classified: Secondary | ICD-10-CM | POA: Diagnosis not present

## 2022-09-22 ENCOUNTER — Other Ambulatory Visit: Payer: Medicare Other

## 2022-09-25 ENCOUNTER — Inpatient Hospital Stay: Payer: Medicare Other

## 2022-09-25 ENCOUNTER — Inpatient Hospital Stay: Payer: Medicare Other | Attending: Oncology | Admitting: Oncology

## 2022-09-25 ENCOUNTER — Encounter: Payer: Self-pay | Admitting: Oncology

## 2022-09-25 VITALS — BP 136/61 | HR 67 | Temp 96.9°F | Resp 18 | Wt 212.4 lb

## 2022-09-25 DIAGNOSIS — C61 Malignant neoplasm of prostate: Secondary | ICD-10-CM

## 2022-09-25 DIAGNOSIS — C7802 Secondary malignant neoplasm of left lung: Secondary | ICD-10-CM | POA: Insufficient documentation

## 2022-09-25 DIAGNOSIS — Z79818 Long term (current) use of other agents affecting estrogen receptors and estrogen levels: Secondary | ICD-10-CM | POA: Diagnosis not present

## 2022-09-25 DIAGNOSIS — Z79899 Other long term (current) drug therapy: Secondary | ICD-10-CM | POA: Diagnosis not present

## 2022-09-25 DIAGNOSIS — Z5111 Encounter for antineoplastic chemotherapy: Secondary | ICD-10-CM | POA: Insufficient documentation

## 2022-09-25 DIAGNOSIS — C9110 Chronic lymphocytic leukemia of B-cell type not having achieved remission: Secondary | ICD-10-CM | POA: Insufficient documentation

## 2022-09-25 DIAGNOSIS — C7801 Secondary malignant neoplasm of right lung: Secondary | ICD-10-CM | POA: Insufficient documentation

## 2022-09-25 DIAGNOSIS — C7800 Secondary malignant neoplasm of unspecified lung: Secondary | ICD-10-CM

## 2022-09-25 LAB — CBC WITH DIFFERENTIAL (CANCER CENTER ONLY)
Abs Immature Granulocytes: 0.03 10*3/uL (ref 0.00–0.07)
Basophils Absolute: 0.1 10*3/uL (ref 0.0–0.1)
Basophils Relative: 1 %
Eosinophils Absolute: 0.1 10*3/uL (ref 0.0–0.5)
Eosinophils Relative: 1 %
HCT: 41.6 % (ref 39.0–52.0)
Hemoglobin: 14.2 g/dL (ref 13.0–17.0)
Immature Granulocytes: 0 %
Lymphocytes Relative: 46 %
Lymphs Abs: 4.8 10*3/uL — ABNORMAL HIGH (ref 0.7–4.0)
MCH: 31 pg (ref 26.0–34.0)
MCHC: 34.1 g/dL (ref 30.0–36.0)
MCV: 90.8 fL (ref 80.0–100.0)
Monocytes Absolute: 0.6 10*3/uL (ref 0.1–1.0)
Monocytes Relative: 6 %
Neutro Abs: 4.7 10*3/uL (ref 1.7–7.7)
Neutrophils Relative %: 46 %
Platelet Count: 162 10*3/uL (ref 150–400)
RBC: 4.58 MIL/uL (ref 4.22–5.81)
RDW: 13.9 % (ref 11.5–15.5)
WBC Count: 10.2 10*3/uL (ref 4.0–10.5)
nRBC: 0 % (ref 0.0–0.2)

## 2022-09-25 LAB — CMP (CANCER CENTER ONLY)
ALT: 17 U/L (ref 0–44)
AST: 19 U/L (ref 15–41)
Albumin: 4.1 g/dL (ref 3.5–5.0)
Alkaline Phosphatase: 60 U/L (ref 38–126)
Anion gap: 10 (ref 5–15)
BUN: 33 mg/dL — ABNORMAL HIGH (ref 8–23)
CO2: 28 mmol/L (ref 22–32)
Calcium: 8.9 mg/dL (ref 8.9–10.3)
Chloride: 100 mmol/L (ref 98–111)
Creatinine: 0.88 mg/dL (ref 0.61–1.24)
GFR, Estimated: >60 mL/min (ref >60)
Glucose, Bld: 97 mg/dL (ref 70–99)
Potassium: 3.5 mmol/L (ref 3.5–5.1)
Sodium: 138 mmol/L (ref 135–145)
Total Bilirubin: 0.6 mg/dL (ref 0.3–1.2)
Total Protein: 6.8 g/dL (ref 6.5–8.1)

## 2022-09-25 MED ORDER — LEUPROLIDE ACETATE (6 MONTH) 45 MG ~~LOC~~ KIT
45.0000 mg | PACK | Freq: Once | SUBCUTANEOUS | Status: AC
  Administered 2022-09-25: 45 mg via SUBCUTANEOUS
  Filled 2022-09-25: qty 45

## 2022-09-25 NOTE — Assessment & Plan Note (Signed)
Eligard 45mg  Q6 months. - Today and Next due in Oct 2024

## 2022-09-25 NOTE — Assessment & Plan Note (Addendum)
Stage IV castration sensitive prostate cancer with multiple radiotracer avid lung nodules on PSMA. Previous lung nodule biopsy results are consistent with malignant cells, non-small cell carcinoma, not enough tissue for further work-up for tissue origin. Nodules are avid on PSMA, likely metastatic prostate cancer lung involvement.  10/24/21 PMSA PET scan showed interval decrease of pulmonary nodules-treatment response.  Continue Adrogen deprivation therapy.- today, next due Lupron 45mg  Q6 months - Oct 2024 Labs are reviewed and discussed with patient.  Continue Apalutamide 240mg  daily

## 2022-09-25 NOTE — Assessment & Plan Note (Signed)
Diagnosed on 07/08/2012.  Currently on watchful waiting 

## 2022-09-25 NOTE — Progress Notes (Signed)
Hematology/Oncology Progress note [phone] [phone]    CHIEF COMPLAINTS/REASON FOR VISIT:  Prostate cancer  ASSESSMENT & PLAN:   Cancer Staging  CLL (chronic lymphocytic leukemia) Staging form: Chronic Lymphocytic Leukemia / Small Lymphocytic Lymphoma, AJCC 8th Edition - Clinical stage from 01/10/2021: Modified Rai Stage 0 (Modified Rai risk: Low, Lymphocytosis: Present, Adenopathy: Absent, Organomegaly: Absent, Anemia: Absent, Thrombocytopenia: Absent) - Signed by Artis Delay, MD on 01/10/2021  Prostate cancer Staging form: Prostate, AJCC 8th Edition - Clinical: Stage IVB (ycTX, cNX, cM1) - Signed by Rickard Patience, MD on 10/08/2021   Prostate cancer (HCC) Stage IV castration sensitive prostate cancer with multiple radiotracer avid lung nodules on PSMA. Previous lung nodule biopsy results are consistent with malignant cells, non-small cell carcinoma, not enough tissue for further work-up for tissue origin. Nodules are avid on PSMA, likely metastatic prostate cancer lung involvement.  10/24/21 PMSA PET scan showed interval decrease of pulmonary nodules-treatment response.  Continue Adrogen deprivation therapy.- today, next due Lupron 45mg  Q6 months - Oct 2024 Labs are reviewed and discussed with patient.  Continue Apalutamide 240mg  daily   CLL (chronic lymphocytic leukemia) (HCC) Diagnosed on 07/08/2012.  Currently on watchful waiting  Encounter for antineoplastic chemotherapy Treatment plan as listed above.  Encounter for monitoring androgen deprivation therapy Eligard 45mg  Q6 months. - Today and Next due in Oct 2024  Orders Placed This Encounter  Procedures   CBC with Differential (Cancer Center Only)    Standing Status:   Future    Standing Expiration Date:   09/25/2023   CMP (Cancer Center only)    Standing Status:   Future    Standing Expiration Date:   09/25/2023   PSA    Standing Status:   Future    Standing Expiration Date:   09/25/2023      Follow-up in 3 months All questions were answered. The patient knows to call the clinic with any problems questions or concerns.   Rickard Patience, MD, PhD [facility] Hematology Oncology 09/25/2022    HISTORY OF PRESENTING ILLNESS:   Brian Bean is a  82 y.o.  male with PMH listed below was seen in consultation at the request of  Merlene Laughter, MD  for evaluation of prostate cancer.  Patient's oncology care was previously with Dr. Eli Hose.  Patient presents for second opinion and prefers to transfer care to our cancer center. I reviewed patient's previous oncology records.  Oncology History  CLL (chronic lymphocytic leukemia)  07/08/2012 Pathology Results   Accession #: [id number]  peripheral blood comfirmed CLL   2015 Initial Diagnosis   #History of prostate cancer, initially diagnosed in June 2015, Gleason score of 6, patient has been on active surveillance between 2015-2019, PSA gradually increasing up to 16.9.   06/24/2016 Pathology Results   CLL FISH showed 3.67% positive for deletion 13 q and 5% positive for p53 deletion   01/10/2021 Cancer Staging   Staging form: Chronic Lymphocytic Leukemia / Small Lymphocytic Lymphoma, AJCC 8th Edition - Clinical stage from 01/10/2021: Modified Rai Stage 0 (Modified Rai risk: Low, Lymphocytosis: Present, Adenopathy: Absent, Organomegaly: Absent, Anemia: Absent, Thrombocytopenia: Absent) - Signed by Artis Delay, MD on 01/10/2021 Stage prefix: Initial diagnosis   07/02/2022 -  Chemotherapy   Started on apalutamide 240mg     Prostate cancer  2015 Initial Diagnosis   #History of prostate cancer, initially diagnosed in June 2015, Gleason score of 6, patient has been on active surveillance between 2015-2019, PSA gradually increasing up to 16.9.   06/24/2016  Initial Diagnosis   Prostate cancer (HCC)   12/28/2020 Imaging   12/28/2020, PSMA at [facility] showed multiple bilateral radiotracer avid pulmonary nodules.  For example right upper lobe 1.2 x 1.4 cm  and the left lower lobe 1.5 x 1.3 cm.  Prostate showed no focal radiotracer uptake to suggest local recurrence.  No PSMA avid pelvic, retroperitoneal, or mesenteric adenopathy.  Multiple pulmonary radiotracer avid nodules concerning for metastatic disease.  No osseous metastatic disease.  Severe three-vessel coronary artery calcifications   04/16/2021 Initial Biopsy   patient underwent biopsy via bronchoscopy. Lung R LL biopsy showed malignant cells consistent with non-small cell carcinoma. Lung RLL brushing showed malignant cells consistent with non-small cell carcinoma, Lung R UL brushing showed no malignant cells. Lung R UL biopsy showed malignant cells consistent with non-small cell carcinoma. Lung RUL lavage showed no malignant cells identified. Immunostains for TTF-1, Napsin A, p63 and CK5/6 were attempted but are noncontributory.  There is insufficient tumor tissue on the cellblock for additional studies.     05/2021 Tumor Marker   PSA 8.7   05/10/2021 Miscellaneous   05/10/2021, started on androgen deprivation therapy. Eligard 30 mg every 4 months   08/2021 Tumor Marker   PSA 0.2   10/08/2021 Cancer Staging   Staging form: Prostate, AJCC 8th Edition - Clinical: Stage IVB (ycTX, cNX, cM1) - Signed by Rickard Patience, MD on 10/08/2021 Stage prefix: Post-therapy   10/23/2021 Imaging   PSMA PET scan showed Small bilateral pulmonary metastases show intense radiopharmaceutical uptake, which show slight decrease in size since prior outside PET-CT. No new or progressive metastatic disease.   11/08/2021 Imaging   CT chest wo contrast showed the scattered pulmonary nodules are mildly reduced in size compared to 04/16/2021. No new nodules identified.   02/07/2022 Procedure   Status post left pleural effusion thoracentesis -cytology not diagnostic of malignancy.   07/15/2022 Tumor Marker   PSA <0.01   08/13/2022 Tumor Marker   PSA <0.01    INTERVAL HISTORY Brian Bean is a 82 y.o. male who has  above history reviewed by me today presents for follow up visit for prostate cancer. Patient reports feeling well.   07/02/2022 apalutamide 240mg  daily.  Overall he tolerates treatment with no significant side effects. Denies SOB, cough.   Review of Systems  Constitutional:  Positive for fatigue. Negative for appetite change, chills, fever and unexpected weight change.  HENT:   Negative for hearing loss and voice change.   Eyes:  Negative for eye problems and icterus.  Respiratory:  Negative for chest tightness, cough and shortness of breath.   Cardiovascular:  Negative for chest pain and leg swelling.  Gastrointestinal:  Negative for abdominal distention and abdominal pain.  Endocrine: Positive for hot flashes.  Genitourinary:  Negative for difficulty urinating, dysuria and frequency.   Musculoskeletal:  Negative for arthralgias.  Skin:  Negative for itching and rash.  Neurological:  Negative for light-headedness and numbness.  Hematological:  Negative for adenopathy. Does not bruise/bleed easily.  Psychiatric/Behavioral:  Negative for confusion.     MEDICAL HISTORY:  Past Medical History:  Diagnosis Date   Anxiety    Arthritis    Cancer    skin lesion scalp   CLL (chronic lymphocytic leukemia) 01/06/2013   CLL (chronic lymphocytic leukemia)    Depression    Head injury    fell from a horse   Hypertension    Lymphocytosis    PONV (postoperative nausea and vomiting)    extremely sick did  not get sick after hip surgery in 2013   Prostate cancer     SURGICAL HISTORY: Past Surgical History:  Procedure Laterality Date   APPENDECTOMY     BRONCHIAL BIOPSY  04/16/2021   Procedure: BRONCHIAL BIOPSIES;  Surgeon: Josephine Igo, DO;  Location: [facility];  Service: Pulmonary;;   BRONCHIAL BRUSHINGS  04/16/2021   Procedure: BRONCHIAL BRUSHINGS;  Surgeon: Josephine Igo, DO;  Location: [facility];  Service: Pulmonary;;   BRONCHIAL NEEDLE ASPIRATION BIOPSY  04/16/2021    Procedure: BRONCHIAL NEEDLE ASPIRATION BIOPSIES;  Surgeon: Josephine Igo, DO;  Location: [facility];  Service: Pulmonary;;   BRONCHIAL WASHINGS  04/16/2021   Procedure: BRONCHIAL WASHINGS;  Surgeon: Josephine Igo, DO;  Location: [facility];  Service: Pulmonary;;   CATARACT EXTRACTION, BILATERAL     CERVICAL DISC SURGERY  00   COLONOSCOPY  2011   [facility].    FRACTURE SURGERY     rt ankle   HERNIA REPAIR     rt and  undistended testicle   PROSTATE BIOPSY     REVERSE SHOULDER ARTHROPLASTY Right 05/01/2020   Procedure: REVERSE SHOULDER ARTHROPLASTY;  Surgeon: Christena Flake, MD;  Location: [facility];  Service: Orthopedics;  Laterality: Right;   rt hip  12   pinned   TOTAL HIP ARTHROPLASTY  05/03/2012   Procedure: TOTAL HIP ARTHROPLASTY;  Surgeon: [name], MD;  Location: [facility];  Service: Orthopedics;  Laterality: Right;   VIDEO BRONCHOSCOPY WITH ENDOBRONCHIAL NAVIGATION Right 04/16/2021   Procedure: VIDEO BRONCHOSCOPY WITH ENDOBRONCHIAL NAVIGATION;  Surgeon: Josephine Igo, DO;  Location: [facility];  Service: Pulmonary;  Laterality: Right;  ION w/ CIOS   VIDEO BRONCHOSCOPY WITH RADIAL ENDOBRONCHIAL ULTRASOUND  04/16/2021   Procedure: RADIAL ENDOBRONCHIAL ULTRASOUND;  Surgeon: Josephine Igo, DO;  Location: [facility];  Service: Pulmonary;;    SOCIAL HISTORY: Social History   Socioeconomic History   Marital status: Divorced    Spouse name: Not on file   Number of children: 1   Years of education: Not on file   Highest education level: Not on file  Occupational History    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[employer])  Tobacco Use   Smoking status: Former    Packs/day: 0.50    Years: 10.00    Additional pack years: 0.00    Total pack years: 5.00    Types: Cigarettes    Quit date: 04/28/1977    Years since quitting: 45.4   Smokeless tobacco: Never   Tobacco comments:    10 oz alcohol wkly  Vaping Use   Vaping Use: Never used  Substance and Sexual  Activity   Alcohol use: Yes    Alcohol/week: 21.0 standard drinks of alcohol    Types: 5 Glasses of wine, 16 Shots of liquor per week    Comment: 14 ounces a week   Drug use: No   Sexual activity: Yes  Other Topics Concern   Not on file  Social History Narrative   Retired.   Significant other: Randa Spike   One son, Luisa Hart   Social Determinants of Health   Financial Resource Strain: Not on [employer] Insecurity: Not on file  Transportation Needs: Not on file  Physical Activity: Not on file  Stress: Not on file  Social Connections: Not on file  Intimate Partner Violence: Not on file    FAMILY HISTORY: Family History  Problem Relation Age of Onset   Heart disease Mother    Cancer  Father        unk type of cancer    ALLERGIES:  has No Known Allergies.  MEDICATIONS:  Current Outpatient Medications  Medication Sig Dispense Refill   amLODipine (NORVASC) 5 MG tablet Take 5 mg by mouth 2 (two) times daily.      apalutamide (ERLEADA) 60 MG tablet Take 240 mg by mouth daily.     aspirin EC 81 MG tablet Take 81 mg by mouth daily. Swallow whole.     atorvastatin (LIPITOR) 20 MG tablet Take 20 mg by mouth daily.     carvedilol (COREG) 12.5 MG tablet Take 12.5 mg by mouth 2 (two) times daily with a meal.      hydrochlorothiazide (HYDRODIURIL) 25 MG tablet Take 25 mg by mouth daily.      ipratropium (ATROVENT) 0.06 % nasal spray Place 2 sprays into both nostrils as needed for rhinitis.     ketoconazole (NIZORAL) 2 % shampoo Apply 1 application topically daily as needed for irritation.     lisinopril (PRINIVIL,ZESTRIL) 10 MG tablet Take 10 mg by mouth daily.     tamsulosin (FLOMAX) 0.4 MG CAPS capsule Take 0.4 mg by mouth daily.     fluorouracil (EFUDEX) 5 % cream Apply 1 application topically 2 (two) times daily as needed (precancerous spots). (Patient not taking: Reported on 05/26/2022)     FLUoxetine (PROZAC) 40 MG capsule Take 80 mg by mouth daily. (Patient not taking: Reported  on 09/25/2022)     mirabegron ER (MYRBETRIQ) 25 MG TB24 tablet Take 25 mg by mouth daily. (Patient not taking: Reported on 08/13/2022)     naproxen sodium (ALEVE) 220 MG tablet Take 220 mg by mouth daily as needed (pain). (Patient not taking: Reported on 05/26/2022)     OLANZapine (ZYPREXA) 2.5 MG tablet Take 2.5 mg by mouth at bedtime. (Patient not taking: Reported on 05/26/2022)     No current facility-administered medications for this visit.     PHYSICAL EXAMINATION: ECOG PERFORMANCE STATUS: 1 - Symptomatic but completely ambulatory Vitals:   09/25/22 1521  BP: 136/61  Pulse: 67  Resp: 18  Temp: (!) 96.9 F (36.1 C)  SpO2: 98%   Filed Weights   09/25/22 1521  Weight: 212 lb 6.4 oz (96.3 kg)    Physical Exam Constitutional:      General: He is not in acute distress. HENT:     Head: Normocephalic and atraumatic.  Eyes:     General: No scleral icterus. Cardiovascular:     Rate and Rhythm: Normal rate and regular rhythm.     Heart sounds: Normal heart sounds.  Pulmonary:     Effort: Pulmonary effort is normal. No respiratory distress.     Breath sounds: No wheezing.  Abdominal:     General: Bowel sounds are normal. There is no distension.     Palpations: Abdomen is soft.  Musculoskeletal:        General: No deformity. Normal range of motion.     Cervical back: Normal range of motion and neck supple.  Skin:    General: Skin is warm and dry.     Findings: No erythema or rash.  Neurological:     Mental Status: He is alert and oriented to person, place, and time. Mental status is at baseline.     Cranial Nerves: No cranial nerve deficit.     Coordination: Coordination normal.  Psychiatric:        Mood and Affect: Mood normal.     LABORATORY  DATA:  I have reviewed the data as listed Lab Results  Component Value Date   WBC 10.2 09/25/2022   HGB 14.2 09/25/2022   HCT 41.6 09/25/2022   MCV 90.8 09/25/2022   PLT 162 09/25/2022   Recent Labs    01/26/22 1356  01/27/22 0519 07/15/22 0952 08/13/22 1023 09/25/22 1507  NA 139   < > 138 139 138  K 3.5   < > 3.5 3.5 3.5  CL 101   < > 100 101 100  CO2 28   < > GLUCOSE 144*   < > 125* 116* 97  BUN 24*   < > 22 22 33*  CREATININE 0.66   < > 0.99 0.90 0.88  CALCIUM 9.4   < > 9.0 9.1 8.9  GFRNONAA >60   < > >60 >60 >60  PROT 7.0   < > 6.6 7.0 6.8  ALBUMIN 4.2   < > 4.0 4.0 4.1  AST 20   < > ALT 21   < > ALKPHOS 49   < > 54 50 60  BILITOT 1.2   < > 0.6 0.6 0.6  BILIDIR 0.2  --   --   --   --   IBILI 1.0*  --   --   --   --    < > = values in this interval not displayed.    Iron/TIBC/Ferritin/ %Sat No results found for: "IRON", "TIBC", "FERRITIN", "IRONPCTSAT"    RADIOGRAPHIC STUDIES: I have personally reviewed the radiological images as listed and agreed with the findings in the report. No results found.    cc Merlene Laughter, MD

## 2022-09-25 NOTE — Assessment & Plan Note (Signed)
Treatment plan as listed above. 

## 2022-09-26 LAB — PSA: Prostatic Specific Antigen: 0.01 ng/mL (ref 0.00–4.00)

## 2022-10-27 DIAGNOSIS — R911 Solitary pulmonary nodule: Secondary | ICD-10-CM | POA: Diagnosis not present

## 2022-10-27 DIAGNOSIS — R3915 Urgency of urination: Secondary | ICD-10-CM | POA: Diagnosis not present

## 2022-11-13 DIAGNOSIS — C7800 Secondary malignant neoplasm of unspecified lung: Secondary | ICD-10-CM | POA: Diagnosis not present

## 2022-11-13 DIAGNOSIS — R413 Other amnesia: Secondary | ICD-10-CM | POA: Diagnosis not present

## 2022-11-13 DIAGNOSIS — C9110 Chronic lymphocytic leukemia of B-cell type not having achieved remission: Secondary | ICD-10-CM | POA: Diagnosis not present

## 2022-11-13 DIAGNOSIS — I10 Essential (primary) hypertension: Secondary | ICD-10-CM | POA: Diagnosis not present

## 2022-11-13 DIAGNOSIS — Z13820 Encounter for screening for osteoporosis: Secondary | ICD-10-CM | POA: Diagnosis not present

## 2022-11-13 DIAGNOSIS — D8481 Immunodeficiency due to conditions classified elsewhere: Secondary | ICD-10-CM | POA: Diagnosis not present

## 2022-11-13 DIAGNOSIS — I700 Atherosclerosis of aorta: Secondary | ICD-10-CM | POA: Diagnosis not present

## 2022-11-13 DIAGNOSIS — G319 Degenerative disease of nervous system, unspecified: Secondary | ICD-10-CM | POA: Diagnosis not present

## 2022-11-15 DIAGNOSIS — Z961 Presence of intraocular lens: Secondary | ICD-10-CM | POA: Diagnosis not present

## 2022-11-15 DIAGNOSIS — H524 Presbyopia: Secondary | ICD-10-CM | POA: Diagnosis not present

## 2022-11-15 DIAGNOSIS — H5032 Intermittent alternating esotropia: Secondary | ICD-10-CM | POA: Diagnosis not present

## 2022-11-15 DIAGNOSIS — H5203 Hypermetropia, bilateral: Secondary | ICD-10-CM | POA: Diagnosis not present

## 2022-11-15 DIAGNOSIS — H52223 Regular astigmatism, bilateral: Secondary | ICD-10-CM | POA: Diagnosis not present

## 2022-12-15 DIAGNOSIS — H538 Other visual disturbances: Secondary | ICD-10-CM | POA: Diagnosis not present

## 2022-12-15 DIAGNOSIS — H532 Diplopia: Secondary | ICD-10-CM | POA: Diagnosis not present

## 2022-12-15 DIAGNOSIS — H518 Other specified disorders of binocular movement: Secondary | ICD-10-CM | POA: Diagnosis not present

## 2022-12-16 ENCOUNTER — Other Ambulatory Visit: Payer: Self-pay | Admitting: Internal Medicine

## 2022-12-16 DIAGNOSIS — Z13820 Encounter for screening for osteoporosis: Secondary | ICD-10-CM

## 2022-12-18 ENCOUNTER — Inpatient Hospital Stay: Payer: Medicare Other | Attending: Oncology

## 2022-12-18 ENCOUNTER — Other Ambulatory Visit: Payer: Medicare Other

## 2022-12-18 DIAGNOSIS — R5383 Other fatigue: Secondary | ICD-10-CM | POA: Diagnosis not present

## 2022-12-18 DIAGNOSIS — C61 Malignant neoplasm of prostate: Secondary | ICD-10-CM | POA: Diagnosis present

## 2022-12-18 DIAGNOSIS — C7802 Secondary malignant neoplasm of left lung: Secondary | ICD-10-CM | POA: Diagnosis not present

## 2022-12-18 DIAGNOSIS — Z79899 Other long term (current) drug therapy: Secondary | ICD-10-CM | POA: Diagnosis not present

## 2022-12-18 DIAGNOSIS — C7801 Secondary malignant neoplasm of right lung: Secondary | ICD-10-CM | POA: Insufficient documentation

## 2022-12-18 DIAGNOSIS — C9110 Chronic lymphocytic leukemia of B-cell type not having achieved remission: Secondary | ICD-10-CM | POA: Diagnosis not present

## 2022-12-18 LAB — CMP (CANCER CENTER ONLY)
ALT: 17 U/L (ref 0–44)
AST: 17 U/L (ref 15–41)
Albumin: 4.2 g/dL (ref 3.5–5.0)
Alkaline Phosphatase: 53 U/L (ref 38–126)
Anion gap: 11 (ref 5–15)
BUN: 21 mg/dL (ref 8–23)
CO2: 28 mmol/L (ref 22–32)
Calcium: 9.4 mg/dL (ref 8.9–10.3)
Chloride: 100 mmol/L (ref 98–111)
Creatinine: 0.69 mg/dL (ref 0.61–1.24)
GFR, Estimated: >60 mL/min (ref >60)
Glucose, Bld: 123 mg/dL — ABNORMAL HIGH (ref 70–99)
Potassium: 3.7 mmol/L (ref 3.5–5.1)
Sodium: 139 mmol/L (ref 135–145)
Total Bilirubin: 0.6 mg/dL (ref 0.3–1.2)
Total Protein: 6.9 g/dL (ref 6.5–8.1)

## 2022-12-18 LAB — CBC WITH DIFFERENTIAL (CANCER CENTER ONLY)
Abs Immature Granulocytes: 0.04 10*3/uL (ref 0.00–0.07)
Basophils Absolute: 0.1 10*3/uL (ref 0.0–0.1)
Basophils Relative: 1 %
Eosinophils Absolute: 0.1 10*3/uL (ref 0.0–0.5)
Eosinophils Relative: 1 %
HCT: 41.4 % (ref 39.0–52.0)
Hemoglobin: 14.1 g/dL (ref 13.0–17.0)
Immature Granulocytes: 0 %
Lymphocytes Relative: 50 %
Lymphs Abs: 5.5 10*3/uL — ABNORMAL HIGH (ref 0.7–4.0)
MCH: 30.6 pg (ref 26.0–34.0)
MCHC: 34.1 g/dL (ref 30.0–36.0)
MCV: 89.8 fL (ref 80.0–100.0)
Monocytes Absolute: 0.5 10*3/uL (ref 0.1–1.0)
Monocytes Relative: 4 %
Neutro Abs: 4.8 10*3/uL (ref 1.7–7.7)
Neutrophils Relative %: 44 %
Platelet Count: 269 10*3/uL (ref 150–400)
RBC: 4.61 MIL/uL (ref 4.22–5.81)
RDW: 13.5 % (ref 11.5–15.5)
WBC Count: 11 10*3/uL — ABNORMAL HIGH (ref 4.0–10.5)
nRBC: 0 % (ref 0.0–0.2)

## 2022-12-18 LAB — PSA: Prostatic Specific Antigen: 0.01 ng/mL (ref 0.00–4.00)

## 2022-12-25 ENCOUNTER — Inpatient Hospital Stay: Payer: Medicare Other | Admitting: Oncology

## 2022-12-25 ENCOUNTER — Encounter: Payer: Self-pay | Admitting: Oncology

## 2022-12-25 ENCOUNTER — Other Ambulatory Visit: Payer: Medicare Other

## 2022-12-25 ENCOUNTER — Inpatient Hospital Stay: Payer: Medicare Other

## 2022-12-25 VITALS — BP 130/59 | HR 70 | Temp 98.3°F | Resp 18 | Wt 212.3 lb

## 2022-12-25 DIAGNOSIS — R5383 Other fatigue: Secondary | ICD-10-CM

## 2022-12-25 DIAGNOSIS — C61 Malignant neoplasm of prostate: Secondary | ICD-10-CM

## 2022-12-25 DIAGNOSIS — C9110 Chronic lymphocytic leukemia of B-cell type not having achieved remission: Secondary | ICD-10-CM | POA: Diagnosis not present

## 2022-12-25 DIAGNOSIS — Z79818 Long term (current) use of other agents affecting estrogen receptors and estrogen levels: Secondary | ICD-10-CM

## 2022-12-25 DIAGNOSIS — Z79899 Other long term (current) drug therapy: Secondary | ICD-10-CM | POA: Diagnosis not present

## 2022-12-25 DIAGNOSIS — C7802 Secondary malignant neoplasm of left lung: Secondary | ICD-10-CM | POA: Diagnosis not present

## 2022-12-25 DIAGNOSIS — Z5111 Encounter for antineoplastic chemotherapy: Secondary | ICD-10-CM | POA: Diagnosis not present

## 2022-12-25 DIAGNOSIS — C7800 Secondary malignant neoplasm of unspecified lung: Secondary | ICD-10-CM | POA: Diagnosis not present

## 2022-12-25 DIAGNOSIS — C7801 Secondary malignant neoplasm of right lung: Secondary | ICD-10-CM | POA: Diagnosis not present

## 2022-12-25 LAB — VITAMIN D 25 HYDROXY (VIT D DEFICIENCY, FRACTURES): Vit D, 25-Hydroxy: 57.1 ng/mL (ref 30–100)

## 2022-12-25 LAB — VITAMIN B12: Vitamin B-12: 330 pg/mL (ref 180–914)

## 2022-12-25 LAB — TSH: TSH: 1.763 u[IU]/mL (ref 0.350–4.500)

## 2022-12-25 NOTE — Progress Notes (Signed)
Hematology/Oncology Progress note [phone] [phone]    CHIEF COMPLAINTS/REASON FOR VISIT:  Prostate cancer  ASSESSMENT & PLAN:   Cancer Staging  CLL (chronic lymphocytic leukemia) (HCC) Staging form: Chronic Lymphocytic Leukemia / Small Lymphocytic Lymphoma, AJCC 8th Edition - Clinical stage from 01/10/2021: Modified Rai Stage 0 (Modified Rai risk: Low, Lymphocytosis: Present, Adenopathy: Absent, Organomegaly: Absent, Anemia: Absent, Thrombocytopenia: Absent) - Signed by Artis Delay, MD on 01/10/2021  Prostate cancer [facility]) Staging form: Prostate, AJCC 8th Edition - Clinical: Stage IVB (ycTX, cNX, cM1) - Signed by Rickard Patience, MD on 10/08/2021   Prostate cancer (HCC) Stage IV castration sensitive prostate cancer with multiple radiotracer avid lung nodules on PSMA. Previous lung nodule biopsy results are consistent with malignant cells, non-small cell carcinoma, not enough tissue for further work-up for tissue origin. Nodules are avid on PSMA, likely metastatic prostate cancer lung involvement.  10/24/21 PMSA PET scan showed interval decrease of pulmonary nodules-treatment response.  Continue Adrogen deprivation therapy.- today, next due Lupron 45mg  Q6 months - Oct 2024 Labs are reviewed and discussed with patient.  Continue Apalutamide 240mg  daily  CLL (chronic lymphocytic leukemia) (HCC) Diagnosed on 07/08/2012.  Currently on watchful waiting  Encounter for antineoplastic chemotherapy Treatment plan as listed above.  Encounter for monitoring androgen deprivation therapy Eligard 45mg  Q6 months. - Today and Next due in Oct 2024  Fatigue Likely due to apalutamide  Check TSH, Vitamin D and B12 level.   Orders Placed This Encounter  Procedures   TSH    Standing Status:   Future    Number of Occurrences:   1    Standing Expiration Date:   12/25/2023   Vitamin B12    Standing Status:   Future    Number of Occurrences:   1    Standing Expiration Date:    12/25/2023   VITAMIN D 25 Hydroxy (Vit-D Deficiency, Fractures)    Standing Status:   Future    Number of Occurrences:   1    Standing Expiration Date:   12/25/2023   PSA    Standing Status:   Future    Standing Expiration Date:   12/25/2023   CMP (Cancer Center only)    Standing Status:   Future    Standing Expiration Date:   12/25/2023   CBC with Differential (Cancer Center Only)    Standing Status:   Future    Standing Expiration Date:   12/25/2023     Follow-up in 3 months All questions were answered. The patient knows to call the clinic with any problems questions or concerns.   Rickard Patience, MD, PhD [facility] Hematology Oncology 12/25/2022    HISTORY OF PRESENTING ILLNESS:   Brian Bean is a  82 y.o.  male with PMH listed below was seen in consultation at the request of  Brian Laughter, MD  for evaluation of prostate cancer.  Patient's oncology care was previously with Dr. Eli Bean.  Patient presents for second opinion and prefers to transfer care to our cancer center. I reviewed patient's previous oncology records.  Oncology History  CLL (chronic lymphocytic leukemia) (HCC)  07/08/2012 Pathology Results   Accession #: [id number]  peripheral blood comfirmed CLL   2015 Initial Diagnosis   #History of prostate cancer, initially diagnosed in June 2015, Gleason score of 6, patient has been on active surveillance between 2015-2019, PSA gradually increasing up to 16.9.   06/24/2016 Pathology Results   CLL FISH showed 3.67% positive for deletion 13 q and 5%  positive for p53 deletion   01/10/2021 Cancer Staging   Staging form: Chronic Lymphocytic Leukemia / Small Lymphocytic Lymphoma, AJCC 8th Edition - Clinical stage from 01/10/2021: Modified Rai Stage 0 (Modified Rai risk: Low, Lymphocytosis: Present, Adenopathy: Absent, Organomegaly: Absent, Anemia: Absent, Thrombocytopenia: Absent) - Signed by Artis Delay, MD on 01/10/2021 Stage prefix: Initial diagnosis   07/02/2022 -   Chemotherapy   Started on apalutamide 240mg     Prostate cancer (HCC)  2015 Initial Diagnosis   #History of prostate cancer, initially diagnosed in June 2015, Gleason score of 6, patient has been on active surveillance between 2015-2019, PSA gradually increasing up to 16.9.   06/24/2016 Initial Diagnosis   Prostate cancer (HCC)   12/28/2020 Imaging   12/28/2020, PSMA at [facility] showed multiple bilateral radiotracer avid pulmonary nodules.  For example right upper lobe 1.2 x 1.4 cm and the left lower lobe 1.5 x 1.3 cm.  Prostate showed no focal radiotracer uptake to suggest local recurrence.  No PSMA avid pelvic, retroperitoneal, or mesenteric adenopathy.  Multiple pulmonary radiotracer avid nodules concerning for metastatic disease.  No osseous metastatic disease.  Severe three-vessel coronary artery calcifications   04/16/2021 Initial Biopsy   patient underwent biopsy via bronchoscopy. Lung R LL biopsy showed malignant cells consistent with non-small cell carcinoma. Lung RLL brushing showed malignant cells consistent with non-small cell carcinoma, Lung R UL brushing showed no malignant cells. Lung R UL biopsy showed malignant cells consistent with non-small cell carcinoma. Lung RUL lavage showed no malignant cells identified. Immunostains for TTF-1, Napsin A, p63 and CK5/6 were attempted but are noncontributory.  There is insufficient tumor tissue on the cellblock for additional studies.     05/2021 Tumor Marker   PSA 8.7   05/10/2021 Miscellaneous   05/10/2021, started on androgen deprivation therapy. Eligard 30 mg every 4 months   08/2021 Tumor Marker   PSA 0.2   10/08/2021 Cancer Staging   Staging form: Prostate, AJCC 8th Edition - Clinical: Stage IVB (ycTX, cNX, cM1) - Signed by Rickard Patience, MD on 10/08/2021 Stage prefix: Post-therapy   10/23/2021 Imaging   PSMA PET scan showed Small bilateral pulmonary metastases show intense radiopharmaceutical uptake, which show slight decrease in size  since prior outside PET-CT. No new or progressive metastatic disease.   11/08/2021 Imaging   CT chest wo contrast showed the scattered pulmonary nodules are mildly reduced in size compared to 04/16/2021. No new nodules identified.   02/07/2022 Procedure   Status post left pleural effusion thoracentesis -cytology not diagnostic of malignancy.   07/02/2022 -  Chemotherapy   07/02/2022 apalutamide 240mg .     07/15/2022 Tumor Marker   PSA <0.01   08/13/2022 Tumor Marker   PSA <0.01    INTERVAL HISTORY Brian Bean is a 82 y.o. male who has above history reviewed by me today presents for follow up visit for prostate cancer. Patient reports feeling well.   07/02/2022 apalutamide 240mg  daily.  Overall he tolerates treatment with no significant side effects. Denies SOB, cough. + fatigue.    Review of Systems  Constitutional:  Positive for fatigue. Negative for appetite change, chills, fever and unexpected weight change.  HENT:   Negative for hearing loss and voice change.   Eyes:  Negative for eye problems and icterus.  Respiratory:  Negative for chest tightness, cough and shortness of breath.   Cardiovascular:  Negative for chest pain and leg swelling.  Gastrointestinal:  Negative for abdominal distention and abdominal pain.  Endocrine: Positive for hot flashes.  Genitourinary:  Negative for difficulty urinating, dysuria and frequency.   Musculoskeletal:  Negative for arthralgias.  Skin:  Negative for itching and rash.  Neurological:  Negative for light-headedness and numbness.  Hematological:  Negative for adenopathy. Does not bruise/bleed easily.  Psychiatric/Behavioral:  Negative for confusion.     MEDICAL HISTORY:  Past Medical History:  Diagnosis Date   Anxiety    Arthritis    Cancer (HCC)    skin lesion scalp   CLL (chronic lymphocytic leukemia) (HCC) 01/06/2013   CLL (chronic lymphocytic leukemia) (HCC)    Depression    Head injury    fell from a horse   Hypertension     Lymphocytosis    PONV (postoperative nausea and vomiting)    extremely sick did not get sick after hip surgery in 2013   Prostate cancer [facility])     SURGICAL HISTORY: Past Surgical History:  Procedure Laterality Date   APPENDECTOMY     BRONCHIAL BIOPSY  04/16/2021   Procedure: BRONCHIAL BIOPSIES;  Surgeon: Josephine Igo, DO;  Location: [facility];  Service: Pulmonary;;   BRONCHIAL BRUSHINGS  04/16/2021   Procedure: BRONCHIAL BRUSHINGS;  Surgeon: Josephine Igo, DO;  Location: [facility];  Service: Pulmonary;;   BRONCHIAL NEEDLE ASPIRATION BIOPSY  04/16/2021   Procedure: BRONCHIAL NEEDLE ASPIRATION BIOPSIES;  Surgeon: Josephine Igo, DO;  Location: [facility];  Service: Pulmonary;;   BRONCHIAL WASHINGS  04/16/2021   Procedure: BRONCHIAL WASHINGS;  Surgeon: Josephine Igo, DO;  Location: [facility];  Service: Pulmonary;;   CATARACT EXTRACTION, BILATERAL     CERVICAL DISC SURGERY  00   COLONOSCOPY  2011   [facility].    FRACTURE SURGERY     rt ankle   HERNIA REPAIR     rt and  undistended testicle   PROSTATE BIOPSY     REVERSE SHOULDER ARTHROPLASTY Right 05/01/2020   Procedure: REVERSE SHOULDER ARTHROPLASTY;  Surgeon: Christena Flake, MD;  Location: [facility] ORS;  Service: Orthopedics;  Laterality: Right;   rt hip  12   pinned   TOTAL HIP ARTHROPLASTY  05/03/2012   Procedure: TOTAL HIP ARTHROPLASTY;  Surgeon: [name], MD;  Location: [facility] OR;  Service: Orthopedics;  Laterality: Right;   VIDEO BRONCHOSCOPY WITH ENDOBRONCHIAL NAVIGATION Right 04/16/2021   Procedure: VIDEO BRONCHOSCOPY WITH ENDOBRONCHIAL NAVIGATION;  Surgeon: Josephine Igo, DO;  Location: [facility];  Service: Pulmonary;  Laterality: Right;  ION w/ CIOS   VIDEO BRONCHOSCOPY WITH RADIAL ENDOBRONCHIAL ULTRASOUND  04/16/2021   Procedure: RADIAL ENDOBRONCHIAL ULTRASOUND;  Surgeon: Josephine Igo, DO;  Location: [facility];  Service: Pulmonary;;    SOCIAL HISTORY: Social History   Socioeconomic History    Marital status: Divorced    Spouse name: Not on file   Number of children: 1   Years of education: Not on file   Highest education level: Not on file  Occupational History    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[employer])  Tobacco Use   Smoking status: Former    Current packs/day: 0.00    Average packs/day: 0.5 packs/day for 10.0 years (5.0 ttl pk-yrs)    Types: Cigarettes    Start date: 04/29/1967    Quit date: 04/28/1977    Years since quitting: 45.6   Smokeless tobacco: Never   Tobacco comments:    10 oz alcohol wkly  Vaping Use   Vaping status: Never Used  Substance and Sexual Activity   Alcohol use: Yes    Alcohol/week: 21.0 standard drinks  of alcohol    Types: 5 Glasses of wine, 16 Shots of liquor per week    Comment: 14 ounces a week   Drug use: No   Sexual activity: Yes  Other Topics Concern   Not on file  Social History Narrative   Retired.   Significant other: Randa Spike   One son, Luisa Hart   Social Determinants of Health   Financial Resource Strain: Not on [employer] Insecurity: Not on file  Transportation Needs: Not on file  Physical Activity: Not on file  Stress: Not on file  Social Connections: Not on file  Intimate Partner Violence: Not on file    FAMILY HISTORY: Family History  Problem Relation Age of Onset   Heart disease Mother    Cancer Father        unk type of cancer    ALLERGIES:  has No Known Allergies.  MEDICATIONS:  Current Outpatient Medications  Medication Sig Dispense Refill   amLODipine (NORVASC) 5 MG tablet Take 5 mg by mouth 2 (two) times daily.      apalutamide (ERLEADA) 60 MG tablet Take 240 mg by mouth daily.     aspirin EC 81 MG tablet Take 81 mg by mouth daily. Swallow whole.     atorvastatin (LIPITOR) 20 MG tablet Take 20 mg by mouth daily.     carvedilol (COREG) 12.5 MG tablet Take 12.5 mg by mouth 2 (two) times daily with a meal.      fluorouracil (EFUDEX) 5 % cream Apply 1 application topically 2 (two) times  daily as needed (precancerous spots). (Patient not taking: Reported on 05/26/2022)     FLUoxetine (PROZAC) 40 MG capsule Take 80 mg by mouth daily. (Patient not taking: Reported on 09/25/2022)     hydrochlorothiazide (HYDRODIURIL) 25 MG tablet Take 25 mg by mouth daily.      ipratropium (ATROVENT) 0.06 % nasal spray Place 2 sprays into both nostrils as needed for rhinitis.     ketoconazole (NIZORAL) 2 % shampoo Apply 1 application topically daily as needed for irritation.     lisinopril (PRINIVIL,ZESTRIL) 10 MG tablet Take 10 mg by mouth daily.     mirabegron ER (MYRBETRIQ) 25 MG TB24 tablet Take 25 mg by mouth daily. (Patient not taking: Reported on 08/13/2022)     naproxen sodium (ALEVE) 220 MG tablet Take 220 mg by mouth daily as needed (pain). (Patient not taking: Reported on 05/26/2022)     OLANZapine (ZYPREXA) 2.5 MG tablet Take 2.5 mg by mouth at bedtime. (Patient not taking: Reported on 05/26/2022)     tamsulosin (FLOMAX) 0.4 MG CAPS capsule Take 0.4 mg by mouth daily.     No current facility-administered medications for this visit.     PHYSICAL EXAMINATION: ECOG PERFORMANCE STATUS: 1 - Symptomatic but completely ambulatory There were no vitals filed for this visit.  There were no vitals filed for this visit.   Physical Exam Constitutional:      General: He is not in acute distress. HENT:     Head: Normocephalic and atraumatic.  Eyes:     General: No scleral icterus. Cardiovascular:     Rate and Rhythm: Normal rate and regular rhythm.     Heart sounds: Normal heart sounds.  Pulmonary:     Effort: Pulmonary effort is normal. No respiratory distress.     Breath sounds: No wheezing.  Abdominal:     General: Bowel sounds are normal. There is no distension.     Palpations: Abdomen is  soft.  Musculoskeletal:        General: No deformity. Normal range of motion.     Cervical back: Normal range of motion and neck supple.  Skin:    General: Skin is warm and dry.     Findings:  No erythema or rash.  Neurological:     Mental Status: He is alert and oriented to person, place, and time. Mental status is at baseline.     Cranial Nerves: No cranial nerve deficit.     Coordination: Coordination normal.  Psychiatric:        Mood and Affect: Mood normal.     LABORATORY DATA:  I have reviewed the data as listed Lab Results  Component Value Date   WBC 11.0 (H) 12/18/2022   HGB 14.1 12/18/2022   HCT 41.4 12/18/2022   MCV 89.8 12/18/2022   PLT 269 12/18/2022   Recent Labs    01/26/22 1356 01/27/22 0519 08/13/22 1023 09/25/22 1507 12/18/22 1311  NA 139   < > 139 138 139  K 3.5   < > 3.5 3.5 3.7  CL 101   < > 101 100 100  CO2 28   < > 27 28 28   GLUCOSE 144*   < > 116* 97 123*  BUN 24*   < > 22 33* 21  CREATININE 0.66   < > 0.90 0.88 0.69  CALCIUM 9.4   < > 9.1 8.9 9.4  GFRNONAA >60   < > >60 >60 >60  PROT 7.0   < > 7.0 6.8 6.9  ALBUMIN 4.2   < > 4.0 4.1 4.2  AST 20   < > 21 19 17   ALT 21   < > 19 17 17   ALKPHOS 49   < > 50 60 53  BILITOT 1.2   < > 0.6 0.6 0.6  BILIDIR 0.2  --   --   --   --   IBILI 1.0*  --   --   --   --    < > = values in this interval not displayed.   Iron/TIBC/Ferritin/ %Sat No results found for: "IRON", "TIBC", "FERRITIN", "IRONPCTSAT"    RADIOGRAPHIC STUDIES: I have personally reviewed the radiological images as listed and agreed with the findings in the report. No results found.    cc Brian Laughter, MD

## 2022-12-25 NOTE — Progress Notes (Signed)
Pt here for follow up.

## 2022-12-25 NOTE — Assessment & Plan Note (Signed)
Treatment plan as listed above. 

## 2022-12-25 NOTE — Assessment & Plan Note (Signed)
Eligard 45mg  Q6 months. - Today and Next due in Oct 2024

## 2022-12-25 NOTE — Assessment & Plan Note (Signed)
Likely due to apalutamide  Check TSH, Vitamin D and B12 level.

## 2022-12-25 NOTE — Assessment & Plan Note (Signed)
Stage IV castration sensitive prostate cancer with multiple radiotracer avid lung nodules on PSMA. Previous lung nodule biopsy results are consistent with malignant cells, non-small cell carcinoma, not enough tissue for further work-up for tissue origin. Nodules are avid on PSMA, likely metastatic prostate cancer lung involvement.  10/24/21 PMSA PET scan showed interval decrease of pulmonary nodules-treatment response.  Continue Adrogen deprivation therapy.- today, next due Lupron 45mg  Q6 months - Oct 2024 Labs are reviewed and discussed with patient.  Continue Apalutamide 240mg  daily

## 2022-12-25 NOTE — Assessment & Plan Note (Signed)
Diagnosed on 07/08/2012.  Currently on watchful waiting

## 2023-01-24 DIAGNOSIS — H1132 Conjunctival hemorrhage, left eye: Secondary | ICD-10-CM | POA: Diagnosis not present

## 2023-01-29 DIAGNOSIS — H532 Diplopia: Secondary | ICD-10-CM | POA: Diagnosis not present

## 2023-01-29 DIAGNOSIS — H538 Other visual disturbances: Secondary | ICD-10-CM | POA: Diagnosis not present

## 2023-01-29 DIAGNOSIS — H518 Other specified disorders of binocular movement: Secondary | ICD-10-CM | POA: Diagnosis not present

## 2023-02-04 ENCOUNTER — Ambulatory Visit
Admission: RE | Admit: 2023-02-04 | Discharge: 2023-02-04 | Disposition: A | Discharge status: Home / Self Care | Payer: Medicare Other | Source: Ambulatory Visit | Attending: Internal Medicine | Admitting: Internal Medicine

## 2023-02-04 DIAGNOSIS — Z13820 Encounter for screening for osteoporosis: Secondary | ICD-10-CM | POA: Diagnosis not present

## 2023-02-04 DIAGNOSIS — M810 Age-related osteoporosis without current pathological fracture: Secondary | ICD-10-CM | POA: Insufficient documentation

## 2023-02-20 DIAGNOSIS — R350 Frequency of micturition: Secondary | ICD-10-CM | POA: Diagnosis not present

## 2023-02-20 DIAGNOSIS — R3915 Urgency of urination: Secondary | ICD-10-CM | POA: Diagnosis not present

## 2023-03-24 ENCOUNTER — Inpatient Hospital Stay: Payer: Medicare Other | Attending: Oncology

## 2023-03-24 DIAGNOSIS — Z5111 Encounter for antineoplastic chemotherapy: Secondary | ICD-10-CM | POA: Diagnosis not present

## 2023-03-24 DIAGNOSIS — C7802 Secondary malignant neoplasm of left lung: Secondary | ICD-10-CM | POA: Insufficient documentation

## 2023-03-24 DIAGNOSIS — C9110 Chronic lymphocytic leukemia of B-cell type not having achieved remission: Secondary | ICD-10-CM | POA: Insufficient documentation

## 2023-03-24 DIAGNOSIS — C3431 Malignant neoplasm of lower lobe, right bronchus or lung: Secondary | ICD-10-CM | POA: Insufficient documentation

## 2023-03-24 DIAGNOSIS — C61 Malignant neoplasm of prostate: Secondary | ICD-10-CM | POA: Diagnosis present

## 2023-03-24 DIAGNOSIS — Z87891 Personal history of nicotine dependence: Secondary | ICD-10-CM | POA: Diagnosis not present

## 2023-03-24 LAB — CMP (CANCER CENTER ONLY)
ALT: 16 U/L (ref 0–44)
AST: 18 U/L (ref 15–41)
Albumin: 3.7 g/dL (ref 3.5–5.0)
Alkaline Phosphatase: 46 U/L (ref 38–126)
Anion gap: 9 (ref 5–15)
BUN: 22 mg/dL (ref 8–23)
CO2: 25 mmol/L (ref 22–32)
Calcium: 8.8 mg/dL — ABNORMAL LOW (ref 8.9–10.3)
Chloride: 101 mmol/L (ref 98–111)
Creatinine: 0.88 mg/dL (ref 0.61–1.24)
GFR, Estimated: >60 mL/min (ref >60)
Glucose, Bld: 107 mg/dL — ABNORMAL HIGH (ref 70–99)
Potassium: 3.2 mmol/L — ABNORMAL LOW (ref 3.5–5.1)
Sodium: 135 mmol/L (ref 135–145)
Total Bilirubin: 0.6 mg/dL (ref 0.3–1.2)
Total Protein: 6.9 g/dL (ref 6.5–8.1)

## 2023-03-24 LAB — CBC WITH DIFFERENTIAL (CANCER CENTER ONLY)
Abs Immature Granulocytes: 0.06 10*3/uL (ref 0.00–0.07)
Basophils Absolute: 0 10*3/uL (ref 0.0–0.1)
Basophils Relative: 0 %
Eosinophils Absolute: 0.1 10*3/uL (ref 0.0–0.5)
Eosinophils Relative: 1 %
HCT: 38.5 % — ABNORMAL LOW (ref 39.0–52.0)
Hemoglobin: 13.2 g/dL (ref 13.0–17.0)
Immature Granulocytes: 1 %
Lymphocytes Relative: 45 %
Lymphs Abs: 5.8 10*3/uL — ABNORMAL HIGH (ref 0.7–4.0)
MCH: 31.1 pg (ref 26.0–34.0)
MCHC: 34.3 g/dL (ref 30.0–36.0)
MCV: 90.8 fL (ref 80.0–100.0)
Monocytes Absolute: 0.7 10*3/uL (ref 0.1–1.0)
Monocytes Relative: 6 %
Neutro Abs: 6.1 10*3/uL (ref 1.7–7.7)
Neutrophils Relative %: 47 %
Platelet Count: 222 10*3/uL (ref 150–400)
RBC: 4.24 MIL/uL (ref 4.22–5.81)
RDW: 13.4 % (ref 11.5–15.5)
Smear Review: NORMAL
WBC Count: 12.8 10*3/uL — ABNORMAL HIGH (ref 4.0–10.5)
nRBC: 0 % (ref 0.0–0.2)

## 2023-03-25 DIAGNOSIS — C7802 Secondary malignant neoplasm of left lung: Secondary | ICD-10-CM | POA: Diagnosis not present

## 2023-03-25 DIAGNOSIS — C7801 Secondary malignant neoplasm of right lung: Secondary | ICD-10-CM | POA: Diagnosis not present

## 2023-03-25 DIAGNOSIS — R0602 Shortness of breath: Secondary | ICD-10-CM | POA: Diagnosis not present

## 2023-03-25 LAB — PSA: Prostatic Specific Antigen: <0.01 ng/mL (ref 0.00–4.00)

## 2023-03-26 ENCOUNTER — Encounter: Payer: Self-pay | Admitting: Oncology

## 2023-03-26 ENCOUNTER — Inpatient Hospital Stay: Payer: Medicare Other

## 2023-03-26 ENCOUNTER — Inpatient Hospital Stay: Payer: Medicare Other | Admitting: Oncology

## 2023-03-26 VITALS — BP 142/72 | HR 68 | Temp 97.6°F | Wt 212.0 lb

## 2023-03-26 DIAGNOSIS — R5383 Other fatigue: Secondary | ICD-10-CM

## 2023-03-26 DIAGNOSIS — C61 Malignant neoplasm of prostate: Secondary | ICD-10-CM | POA: Diagnosis not present

## 2023-03-26 DIAGNOSIS — C9110 Chronic lymphocytic leukemia of B-cell type not having achieved remission: Secondary | ICD-10-CM | POA: Diagnosis not present

## 2023-03-26 DIAGNOSIS — E876 Hypokalemia: Secondary | ICD-10-CM | POA: Insufficient documentation

## 2023-03-26 DIAGNOSIS — Z79818 Long term (current) use of other agents affecting estrogen receptors and estrogen levels: Secondary | ICD-10-CM

## 2023-03-26 DIAGNOSIS — Z87891 Personal history of nicotine dependence: Secondary | ICD-10-CM | POA: Diagnosis not present

## 2023-03-26 DIAGNOSIS — C3431 Malignant neoplasm of lower lobe, right bronchus or lung: Secondary | ICD-10-CM | POA: Diagnosis not present

## 2023-03-26 DIAGNOSIS — C7802 Secondary malignant neoplasm of left lung: Secondary | ICD-10-CM | POA: Diagnosis not present

## 2023-03-26 DIAGNOSIS — Z5111 Encounter for antineoplastic chemotherapy: Secondary | ICD-10-CM | POA: Diagnosis not present

## 2023-03-26 DIAGNOSIS — C7800 Secondary malignant neoplasm of unspecified lung: Secondary | ICD-10-CM

## 2023-03-26 MED ORDER — LEUPROLIDE ACETATE (6 MONTH) 45 MG ~~LOC~~ KIT
45.0000 mg | PACK | Freq: Once | SUBCUTANEOUS | Status: AC
  Administered 2023-03-26: 45 mg via SUBCUTANEOUS
  Filled 2023-03-26: qty 45

## 2023-03-26 NOTE — Assessment & Plan Note (Addendum)
Eligard 45mg  Q6 months. - Today and Next due in April 2025 Patient is considering discontinuing Eligard due to quality of life concerns, and we discussed the risk of accelerating cancer progression, Patient has chosen to continue with his current treatment regimen, which includes Eligard.

## 2023-03-26 NOTE — Assessment & Plan Note (Signed)
Stage IV castration sensitive prostate cancer with multiple radiotracer avid lung nodules on PSMA. Previous lung nodule biopsy results are consistent with malignant cells, non-small cell carcinoma, not enough tissue for further work-up for tissue origin. Nodules are avid on PSMA, likely metastatic prostate cancer lung involvement.  10/24/21 PMSA PET scan showed interval decrease of pulmonary nodules-treatment response.  Continue Adrogen deprivation therapy.- today, next due Lupron 45mg  Q6 months - Oct 2024 Labs are reviewed and discussed with patient. Continue Apalutamide 240mg  daily

## 2023-03-26 NOTE — Assessment & Plan Note (Signed)
Treatment plan as listed above. 

## 2023-03-26 NOTE — Assessment & Plan Note (Signed)
Recommend patient to discuss provide managing of lasix for supplementation of K.

## 2023-03-26 NOTE — Assessment & Plan Note (Signed)
B12 is borderline normal, recommend B12 1000 mcg daily. [facility] supply

## 2023-03-26 NOTE — Assessment & Plan Note (Signed)
Diagnosed on 07/08/2012.  Currently on watchful waiting

## 2023-03-26 NOTE — Progress Notes (Signed)
Hematology/Oncology Progress note [phone] [phone]    CHIEF COMPLAINTS/REASON FOR VISIT:  Prostate cancer  ASSESSMENT & PLAN:   Cancer Staging  CLL (chronic lymphocytic leukemia) (HCC) Staging form: Chronic Lymphocytic Leukemia / Small Lymphocytic Lymphoma, AJCC 8th Edition - Clinical stage from 01/10/2021: Modified Rai Stage 0 (Modified Rai risk: Low, Lymphocytosis: Present, Adenopathy: Absent, Organomegaly: Absent, Anemia: Absent, Thrombocytopenia: Absent) - Signed by Artis Delay, MD on 01/10/2021  Prostate cancer [facility]) Staging form: Prostate, AJCC 8th Edition - Clinical: Stage IVB (ycTX, cNX, cM1) - Signed by Rickard Patience, MD on 10/08/2021   Prostate cancer (HCC) Stage IV castration sensitive prostate cancer with multiple radiotracer avid lung nodules on PSMA. Previous lung nodule biopsy results are consistent with malignant cells, non-small cell carcinoma, not enough tissue for further work-up for tissue origin. Nodules are avid on PSMA, likely metastatic prostate cancer lung involvement.  10/24/21 PMSA PET scan showed interval decrease of pulmonary nodules-treatment response.  Continue Adrogen deprivation therapy.- today, next due Lupron 45mg  Q6 months - Oct 2024 Labs are reviewed and discussed with patient. Continue Apalutamide 240mg  daily  CLL (chronic lymphocytic leukemia) (HCC) Diagnosed on 07/08/2012.  Currently on watchful waiting  Encounter for antineoplastic chemotherapy Treatment plan as listed above.  Encounter for monitoring androgen deprivation therapy Eligard 45mg  Q6 months. - Today and Next due in April 2025  Fatigue B12 is borderline normal, recommend B12 1000 mcg daily. Otc supply  Hypokalemia Recommend patient to discuss provide managing of lasix for supplementation of K.   Orders Placed This Encounter  Procedures   CBC with Differential (Cancer Center Only)    Standing Status:   Future    Standing Expiration Date:   03/25/2024    CMP (Cancer Center only)    Standing Status:   Future    Standing Expiration Date:   03/25/2024   PSA    Standing Status:   Future    Standing Expiration Date:   03/25/2024     Follow-up in 3 months All questions were answered. The patient knows to call the clinic with any problems questions or concerns.   Rickard Patience, MD, PhD [facility] Hematology Oncology 03/26/2023    HISTORY OF PRESENTING ILLNESS:   Brian Bean is a  82 y.o.  male with PMH listed below was seen in consultation at the request of  Merlene Laughter, MD  for evaluation of prostate cancer.  Patient's oncology care was previously with Dr. Eli Hose.  Patient presents for second opinion and prefers to transfer care to our cancer center. I reviewed patient's previous oncology records.  Oncology History  CLL (chronic lymphocytic leukemia) (HCC)  07/08/2012 Pathology Results   Accession #: [id number]  peripheral blood comfirmed CLL   2015 Initial Diagnosis   #History of prostate cancer, initially diagnosed in June 2015, Gleason score of 6, patient has been on active surveillance between 2015-2019, PSA gradually increasing up to 16.9.   06/24/2016 Pathology Results   CLL FISH showed 3.67% positive for deletion 13 q and 5% positive for p53 deletion   01/10/2021 Cancer Staging   Staging form: Chronic Lymphocytic Leukemia / Small Lymphocytic Lymphoma, AJCC 8th Edition - Clinical stage from 01/10/2021: Modified Rai Stage 0 (Modified Rai risk: Low, Lymphocytosis: Present, Adenopathy: Absent, Organomegaly: Absent, Anemia: Absent, Thrombocytopenia: Absent) - Signed by Artis Delay, MD on 01/10/2021 Stage prefix: Initial diagnosis   07/02/2022 -  Chemotherapy   Started on apalutamide 240mg     Prostate cancer (HCC)  2015 Initial Diagnosis   #  History of prostate cancer, initially diagnosed in June 2015, Gleason score of 6, patient has been on active surveillance between 2015-2019, PSA gradually increasing up to 16.9.   06/24/2016  Initial Diagnosis   Prostate cancer (HCC)   12/28/2020 Imaging   12/28/2020, PSMA at [facility] showed multiple bilateral radiotracer avid pulmonary nodules.  For example right upper lobe 1.2 x 1.4 cm and the left lower lobe 1.5 x 1.3 cm.  Prostate showed no focal radiotracer uptake to suggest local recurrence.  No PSMA avid pelvic, retroperitoneal, or mesenteric adenopathy.  Multiple pulmonary radiotracer avid nodules concerning for metastatic disease.  No osseous metastatic disease.  Severe three-vessel coronary artery calcifications   04/16/2021 Initial Biopsy   patient underwent biopsy via bronchoscopy. Lung R LL biopsy showed malignant cells consistent with non-small cell carcinoma. Lung RLL brushing showed malignant cells consistent with non-small cell carcinoma, Lung R UL brushing showed no malignant cells. Lung R UL biopsy showed malignant cells consistent with non-small cell carcinoma. Lung RUL lavage showed no malignant cells identified. Immunostains for TTF-1, Napsin A, p63 and CK5/6 were attempted but are noncontributory.  There is insufficient tumor tissue on the cellblock for additional studies.     05/2021 Tumor Marker   PSA 8.7   05/10/2021 Miscellaneous   05/10/2021, started on androgen deprivation therapy. Eligard 30 mg every 4 months   08/2021 Tumor Marker   PSA 0.2   10/08/2021 Cancer Staging   Staging form: Prostate, AJCC 8th Edition - Clinical: Stage IVB (ycTX, cNX, cM1) - Signed by Rickard Patience, MD on 10/08/2021 Stage prefix: Post-therapy   10/23/2021 Imaging   PSMA PET scan showed Small bilateral pulmonary metastases show intense radiopharmaceutical uptake, which show slight decrease in size since prior outside PET-CT. No new or progressive metastatic disease.   11/08/2021 Imaging   CT chest wo contrast showed the scattered pulmonary nodules are mildly reduced in size compared to 04/16/2021. No new nodules identified.   02/07/2022 Procedure   Status post left pleural effusion  thoracentesis -cytology not diagnostic of malignancy.   07/02/2022 -  Chemotherapy   07/02/2022 apalutamide 240mg .     07/15/2022 Tumor Marker   PSA <0.01   08/13/2022 Tumor Marker   PSA <0.01   03/24/2023 Tumor Marker   PSA <0.01    INTERVAL HISTORY Brian Bean is a 82 y.o. male who has above history reviewed by me today presents for follow up visit for prostate cancer. Patient reports feeling well.   07/02/2022 apalutamide 240mg  daily.  Overall he tolerates treatment with no significant side effects. Denies SOB, cough. + fatigue. fatigue, lack of energy, and decreased motivation. These symptoms have been so impactful that the patient has questioned the worth of his current lifestyle. The patient also reports experiencing hot flashes, which seem to be triggered by any strenuous activity   Review of Systems  Constitutional:  Positive for fatigue. Negative for appetite change, chills, fever and unexpected weight change.  HENT:   Negative for hearing loss and voice change.   Eyes:  Negative for eye problems and icterus.  Respiratory:  Negative for chest tightness, cough and shortness of breath.   Cardiovascular:  Negative for chest pain and leg swelling.  Gastrointestinal:  Negative for abdominal distention and abdominal pain.  Endocrine: Positive for hot flashes.  Genitourinary:  Negative for difficulty urinating, dysuria and frequency.   Musculoskeletal:  Negative for arthralgias.  Skin:  Negative for itching and rash.  Neurological:  Negative for light-headedness and numbness.  Hematological:  Negative for adenopathy. Does not bruise/bleed easily.  Psychiatric/Behavioral:  Negative for confusion.     MEDICAL HISTORY:  Past Medical History:  Diagnosis Date   Anxiety    Arthritis    Cancer (HCC)    skin lesion scalp   CLL (chronic lymphocytic leukemia) (HCC) 01/06/2013   CLL (chronic lymphocytic leukemia) (HCC)    Depression    Head injury    fell from a horse    Hypertension    Lymphocytosis    PONV (postoperative nausea and vomiting)    extremely sick did not get sick after hip surgery in 2013   Prostate cancer [facility])     SURGICAL HISTORY: Past Surgical History:  Procedure Laterality Date   APPENDECTOMY     BRONCHIAL BIOPSY  04/16/2021   Procedure: BRONCHIAL BIOPSIES;  Surgeon: Josephine Igo, DO;  Location: [facility];  Service: Pulmonary;;   BRONCHIAL BRUSHINGS  04/16/2021   Procedure: BRONCHIAL BRUSHINGS;  Surgeon: Josephine Igo, DO;  Location: [facility];  Service: Pulmonary;;   BRONCHIAL NEEDLE ASPIRATION BIOPSY  04/16/2021   Procedure: BRONCHIAL NEEDLE ASPIRATION BIOPSIES;  Surgeon: Josephine Igo, DO;  Location: [facility];  Service: Pulmonary;;   BRONCHIAL WASHINGS  04/16/2021   Procedure: BRONCHIAL WASHINGS;  Surgeon: Josephine Igo, DO;  Location: [facility];  Service: Pulmonary;;   CATARACT EXTRACTION, BILATERAL     CERVICAL DISC SURGERY  00   COLONOSCOPY  2011   [facility].    FRACTURE SURGERY     rt ankle   HERNIA REPAIR     rt and  undistended testicle   PROSTATE BIOPSY     REVERSE SHOULDER ARTHROPLASTY Right 05/01/2020   Procedure: REVERSE SHOULDER ARTHROPLASTY;  Surgeon: Christena Flake, MD;  Location: [facility] ORS;  Service: Orthopedics;  Laterality: Right;   rt hip  12   pinned   TOTAL HIP ARTHROPLASTY  05/03/2012   Procedure: TOTAL HIP ARTHROPLASTY;  Surgeon: [name], MD;  Location: [facility] OR;  Service: Orthopedics;  Laterality: Right;   VIDEO BRONCHOSCOPY WITH ENDOBRONCHIAL NAVIGATION Right 04/16/2021   Procedure: VIDEO BRONCHOSCOPY WITH ENDOBRONCHIAL NAVIGATION;  Surgeon: Josephine Igo, DO;  Location: [facility];  Service: Pulmonary;  Laterality: Right;  ION w/ CIOS   VIDEO BRONCHOSCOPY WITH RADIAL ENDOBRONCHIAL ULTRASOUND  04/16/2021   Procedure: RADIAL ENDOBRONCHIAL ULTRASOUND;  Surgeon: Josephine Igo, DO;  Location: [facility];  Service: Pulmonary;;    SOCIAL HISTORY: Social History    Socioeconomic History   Marital status: Divorced    Spouse name: Not on file   Number of children: 1   Years of education: Not on file   Highest education level: Not on file  Occupational History    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[employer])  Tobacco Use   Smoking status: Former    Current packs/day: 0.00    Average packs/day: 0.5 packs/day for 10.0 years (5.0 ttl pk-yrs)    Types: Cigarettes    Start date: 04/29/1967    Quit date: 04/28/1977    Years since quitting: 45.9   Smokeless tobacco: Never   Tobacco comments:    10 oz alcohol wkly  Vaping Use   Vaping status: Never Used  Substance and Sexual Activity   Alcohol use: Yes    Alcohol/week: 21.0 standard drinks of alcohol    Types: 5 Glasses of wine, 16 Shots of liquor per week    Comment: 14 ounces a week   Drug use: No   Sexual activity: Yes  Other Topics Concern   Not on file  Social History Narrative   Retired.   Significant other: Randa Spike   One son, Luisa Hart   Social Determinants of Health   Financial Resource Strain: Not on [employer] Insecurity: Not on file  Transportation Needs: Not on file  Physical Activity: Not on file  Stress: Not on file  Social Connections: Not on file  Intimate Partner Violence: Not on file    FAMILY HISTORY: Family History  Problem Relation Age of Onset   Heart disease Mother    Cancer Father        unk type of cancer    ALLERGIES:  has No Known Allergies.  MEDICATIONS:  Current Outpatient Medications  Medication Sig Dispense Refill   amLODipine (NORVASC) 5 MG tablet Take 5 mg by mouth 2 (two) times daily.      apalutamide (ERLEADA) 60 MG tablet Take 240 mg by mouth daily.     aspirin EC 81 MG tablet Take 81 mg by mouth daily. Swallow whole.     atorvastatin (LIPITOR) 20 MG tablet Take 20 mg by mouth daily.     carvedilol (COREG) 12.5 MG tablet Take 12.5 mg by mouth 2 (two) times daily with a meal.      Cholecalciferol 125 MCG (5000 UT) TABS Take by  mouth.     fluorouracil (EFUDEX) 5 % cream Apply 1 application  topically 2 (two) times daily as needed (precancerous spots).     FLUoxetine (PROZAC) 40 MG capsule Take 80 mg by mouth daily.     furosemide (LASIX) 20 MG tablet Take 20 mg by mouth daily.     GEMTESA 75 MG TABS Take 1 tablet by mouth daily.     hydrochlorothiazide (HYDRODIURIL) 25 MG tablet Take 25 mg by mouth daily.      tamsulosin (FLOMAX) 0.4 MG CAPS capsule Take 0.4 mg by mouth daily.     ipratropium (ATROVENT) 0.06 % nasal spray Place 2 sprays into both nostrils as needed for rhinitis. (Patient not taking: Reported on 12/25/2022)     ketoconazole (NIZORAL) 2 % shampoo Apply 1 application topically daily as needed for irritation. (Patient not taking: Reported on 12/25/2022)     lisinopril (PRINIVIL,ZESTRIL) 10 MG tablet Take 10 mg by mouth daily. (Patient not taking: Reported on 03/26/2023)     mirabegron ER (MYRBETRIQ) 25 MG TB24 tablet Take 25 mg by mouth daily. (Patient not taking: Reported on 08/13/2022)     naproxen sodium (ALEVE) 220 MG tablet Take 220 mg by mouth daily as needed (pain). (Patient not taking: Reported on 05/26/2022)     OLANZapine (ZYPREXA) 2.5 MG tablet Take 2.5 mg by mouth at bedtime. (Patient not taking: Reported on 05/26/2022)     No current facility-administered medications for this visit.     PHYSICAL EXAMINATION: ECOG PERFORMANCE STATUS: 1 - Symptomatic but completely ambulatory Vitals:   03/26/23 1413  BP: (!) 142/72  Pulse: 68  Temp: 97.6 F (36.4 C)  SpO2: 99%    Filed Weights   03/26/23 1413  Weight: 212 lb (96.2 kg)     Physical Exam Constitutional:      General: He is not in acute distress. HENT:     Head: Normocephalic and atraumatic.  Eyes:     General: No scleral icterus. Cardiovascular:     Rate and Rhythm: Normal rate and regular rhythm.     Heart sounds: Normal heart sounds.  Pulmonary:     Effort: Pulmonary effort  is normal. No respiratory distress.     Breath  sounds: No wheezing.  Abdominal:     General: Bowel sounds are normal. There is no distension.     Palpations: Abdomen is soft.  Musculoskeletal:        General: No deformity. Normal range of motion.     Cervical back: Normal range of motion and neck supple.  Skin:    General: Skin is warm and dry.     Findings: No erythema or rash.  Neurological:     Mental Status: He is alert and oriented to person, place, and time. Mental status is at baseline.     Cranial Nerves: No cranial nerve deficit.     Coordination: Coordination normal.  Psychiatric:        Mood and Affect: Mood normal.     LABORATORY DATA:  I have reviewed the data as listed Lab Results  Component Value Date   WBC 12.8 (H) 03/24/2023   HGB 13.2 03/24/2023   HCT 38.5 (L) 03/24/2023   MCV 90.8 03/24/2023   PLT 222 03/24/2023   Recent Labs    09/25/22 1507 12/18/22 1311 03/24/23 1346  NA 138 139 135  K 3.5 3.7 3.2*  CL 100 100 101  CO2 28 28 25   GLUCOSE 97 123* 107*  BUN 33* 21 22  CREATININE 0.88 0.69 0.88  CALCIUM 8.9 9.4 8.8*  GFRNONAA >60 >60 >60  PROT 6.8 6.9 6.9  ALBUMIN 4.1 4.2 3.7  AST 19 17 18   ALT 17 17 16   ALKPHOS 60 53 46  BILITOT 0.6 0.6 0.6   Iron/TIBC/Ferritin/ %Sat No results found for: "IRON", "TIBC", "FERRITIN", "IRONPCTSAT"    RADIOGRAPHIC STUDIES: I have personally reviewed the radiological images as listed and agreed with the findings in the report. DG BONE DENSITY (DXA)  Result Date: 02/04/2023 EXAM: DUAL X-RAY ABSORPTIOMETRY (DXA) FOR BONE MINERAL DENSITY IMPRESSION: Your patient Chief Perreault completed a BMD test on 02/04/2023 using the [employer] Advance DXA System (analysis version: 14.10) manufactured by [employer]. The following summarizes the results of our evaluation. Technologist: LCE PATIENT BIOGRAPHICAL: Name: Brian Bean, Brian Bean Patient ID: [id number] Birth Date: 08/07/1940 Height: 69.5 in. Gender: Male Exam Date: 02/04/2023 Weight: 208.4 lbs. Indications: Advanced  Age, Caucasian, Height Loss, History of Fracture (Adult) Fractures: Ankle Right, Hip Right, Shoulder Right Treatments: Aspirin, Vitamin D ASSESSMENT: The BMD measured at Femur Neck is 0.717 g/cm2 with a T-score of-2.7. Lumbar spine was not utilized due to advanced degenerative changes/scoliosis, etc. The scan quality is good. Site Region Measured Measured WHO Young Adult BMD Date       Age      Classification T-score Left Femur Neck 02/04/2023 79.8 Osteoporosis -2.7 0.717 g/cm2 Left Forearm Radius 33% 02/04/2023 79.8 Normal 0.9 1.080 g/cm2 World Health Organization [facility]) criteria for post-menopausal, Caucasian Women: Normal:       T-score at or above -1 SD Osteopenia:   T-score between -1 and -2.5 SD Osteoporosis: T-score at or below -2.5 SD RECOMMENDATIONS: 1. All patients should optimize calcium and vitamin D intake. 2. Consider FDA-approved medical therapies in postmenopausal women and men aged 42 years and older, based on the following: a. A hip or vertebral (clinical or morphometric) fracture b. T-score < -2.5 at the femoral neck or spine after appropriate evaluation to exclude secondary causes c. Low bone mass (T-score between -1.0 and -2.5 at the femoral neck or spine) and a 10-year probability of a hip fracture > 3% or a 10-year probability  of a major osteoporosis-related fracture > 20% based on the [place]-adapted WHO algorithm d. Clinician judgment and/or patient preferences may indicate treatment for people with 10-year fracture probabilities above or below these levels FOLLOW-UP: People with diagnosed cases of osteoporosis or osteopenia should be regularly tested for bone mineral density. For patients eligible for Medicare, routine testing is allowed once every 2 years. The testing frequency can be increased to one year for patients who have rapidly progressing disease, or for those who are receiving medical therapy to restore bone mass. I have reviewed this report, and agree with the above findings.  [facility] Radiology Electronically Signed   By: Baird Lyons M.D.   On: 02/04/2023 13:44      cc Merlene Laughter, MD

## 2023-03-27 ENCOUNTER — Encounter: Payer: Self-pay | Admitting: Oncology

## 2023-03-30 DIAGNOSIS — M8000XA Age-related osteoporosis with current pathological fracture, unspecified site, initial encounter for fracture: Secondary | ICD-10-CM | POA: Diagnosis not present

## 2023-04-02 ENCOUNTER — Encounter: Payer: Self-pay | Admitting: Physical Therapy

## 2023-04-02 ENCOUNTER — Ambulatory Visit: Payer: Medicare Other | Attending: Urology | Admitting: Physical Therapy

## 2023-04-02 DIAGNOSIS — R278 Other lack of coordination: Secondary | ICD-10-CM

## 2023-04-02 DIAGNOSIS — M533 Sacrococcygeal disorders, not elsewhere classified: Secondary | ICD-10-CM | POA: Diagnosis not present

## 2023-04-02 DIAGNOSIS — R2689 Other abnormalities of gait and mobility: Secondary | ICD-10-CM

## 2023-04-02 NOTE — Therapy (Signed)
OUTPATIENT PHYSICAL THERAPY EVALUATION   Patient Name: Brian Bean MRN: [id number] DOB:Dec 27, 1940, 82 y.o., male Today's Date: 04/02/2023   PT End of Session - 04/02/23 1430     Visit Number 1    Number of Visits 10    Date for PT Re-Evaluation 06/11/23    PT Start Time 1430    PT Stop Time 1500    PT Time Calculation (min) 30 min    Activity Tolerance Patient tolerated treatment well;No increased pain    Behavior During Therapy WFL for tasks assessed/performed             Past Medical History:  Diagnosis Date   Anxiety    Arthritis    Cancer (HCC)    skin lesion scalp   CLL (chronic lymphocytic leukemia) (HCC) 01/06/2013   CLL (chronic lymphocytic leukemia) (HCC)    Depression    Head injury    fell from a horse   Hypertension    Lymphocytosis    PONV (postoperative nausea and vomiting)    extremely sick did not get sick after hip surgery in 2013   Prostate cancer [facility])    Past Surgical History:  Procedure Laterality Date   APPENDECTOMY     BRONCHIAL BIOPSY  04/16/2021   Procedure: BRONCHIAL BIOPSIES;  Surgeon: Josephine Igo, DO;  [facility];  Service: Pulmonary;;   BRONCHIAL BRUSHINGS  04/16/2021   Procedure: BRONCHIAL BRUSHINGS;  Surgeon: Josephine Igo, DO;  [facility]: [facility];  Service: Pulmonary;;   BRONCHIAL NEEDLE ASPIRATION BIOPSY  04/16/2021   Procedure: BRONCHIAL NEEDLE ASPIRATION BIOPSIES;  Surgeon: Josephine Igo, DO;  [facility];  Service: Pulmonary;;   BRONCHIAL WASHINGS  04/16/2021   Procedure: BRONCHIAL WASHINGS;  Surgeon: Josephine Igo, DO;  [facility];  Service: Pulmonary;;   CATARACT EXTRACTION, BILATERAL     CERVICAL DISC SURGERY  00   COLONOSCOPY  2011   [facility].    FRACTURE SURGERY     rt ankle   HERNIA REPAIR     rt and  undistended testicle   PROSTATE BIOPSY     REVERSE SHOULDER ARTHROPLASTY Right 05/01/2020   Procedure: REVERSE SHOULDER ARTHROPLASTY;  Surgeon: Christena Flake, MD;   Location: [facility];  Service: Orthopedics;  Laterality: Right;   rt hip  12   pinned   TOTAL HIP ARTHROPLASTY  05/03/2012   Procedure: TOTAL HIP ARTHROPLASTY;  Surgeon: [name], MD;  [facility]: [facility];  Service: Orthopedics;  Laterality: Right;   VIDEO BRONCHOSCOPY WITH ENDOBRONCHIAL NAVIGATION Right 04/16/2021   Procedure: VIDEO BRONCHOSCOPY WITH ENDOBRONCHIAL NAVIGATION;  Surgeon: Josephine Igo, DO;  [facility];  Service: Pulmonary;  Laterality: Right;  ION w/ CIOS   VIDEO BRONCHOSCOPY WITH RADIAL ENDOBRONCHIAL ULTRASOUND  04/16/2021   Procedure: RADIAL ENDOBRONCHIAL ULTRASOUND;  Surgeon: Josephine Igo, DO;  [facility];  Service: Pulmonary;;   Patient Active Problem List   Diagnosis Date Noted   Hypokalemia 03/26/2023   Fatigue 12/25/2022   Encounter for monitoring androgen deprivation therapy 09/25/2022   Encounter for antineoplastic chemotherapy 07/15/2022   Left arm weakness 01/28/2022   Pneumothorax 01/27/2022   Wheeze 01/27/2022   Hyperlipidemia, unspecified 01/27/2022   Acute respiratory failure with hypoxia (HCC) 01/27/2022   Multiple rib fractures 01/26/2022   Arthritis 01/26/2022   Hypertension 01/26/2022   Goals of care, counseling/discussion 10/08/2021   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) 03/08/2021   Lung nodules 03/08/2021   History of  skin cancer 08/20/2017   Overweight (BMI 25.0-29.9) 08/20/2017   Prostate cancer (HCC) 06/24/2016   Lymphadenopathy, abdominal 06/24/2016   Thrombocytopenia (HCC) 06/26/2014   CLL (chronic lymphocytic leukemia) (HCC) 01/06/2013   Avascular necrosis of right hip 05/05/2012    PCP: Lester Kinsman PROVIDER: Leandro Reasoner DIAG: urgency of urination   Rationale for Evaluation and Treatment Rehabilitation  THERAPY DIAG:  Sacrococcygeal disorders, not elsewhere classified  Other abnormalities of gait and mobility  Other lack of coordination  ONSET DATE:     SUBJECTIVE STATEMENT ON EVAL 04/02/23 :  urinary frequency:  Pt is on a medication that helped with urgency. Currently frequency is an issue. Pt has to  urinate 4-5 x within the first 2-3 hours upon waking 6:45 -7am.  After the 12 pm, pt's frequency decreases to 2 x within 2 hours. Started after oral Tx of cancer in the lung.   Pt is taking direutic medications.  Nocturia:  1-2 x night.     Physical activities:  goes to the [facility] for water aerobics    PERTINENT HISTORY:   -fall with R rib Fx 2022 -Radiation 2020 for Prostate Cancer  -Currently taking medications to treat the prostate cancer in the lungs  -Hx of skin CA, chronic lymphocytic leukemia -Cervical disc surgery -Appendectomy -Inguinal hernia repair  -R THA with pin in R hip  -R ankle with pin -R shoulder arthroplasty   PAIN:  Are you having pain? No   PRECAUTIONS:        Active Cancer   WEIGHT BEARING RESTRICTIONS: No   FALLS:  Has patient fallen in last 6 months? 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,    PLOF: Independent  PATIENT GOALS:   Strengthen muscles to cut down the morning urinary frequency    OBJECTIVE:    [facility] PT Assessment -     Observation/Other Assessments  Observations feet not planted on floor    Strength  Overall Strength Comments RLE 4-/5, L 5/5    Palpation  SI assessment  R shoulder / R pelvis lowered         [facility] Adult PT Treatment/Exercise -    Ambulation/Gait  Gait Comments 1.2 m/s, R trunk leanm decreased stance on R LE    Therapeutic Activites   Other Therapeutic Activities explained deep core system, pelvic alignment to be corrected to help with leakage , cocreated goals    Neuro Re-ed   Neuro Re-ed Details  cued for sitting posture  sit to stand to minimize leakage            HOME EXERCISE PROGRAM: See pt instruction section    ASSESSMENT:  CLINICAL IMPRESSION:   Pt is a  82  yo  who presents with  urinary leakage  which impact QOL, ADL, fitness, and community activities.   Pt's musculoskeletal assessment revealed uneven pelvic girdle and shoulder height, asymmetries to gait pattern, limited spinal /pelvic mobility, dyscoordination and strength of pelvic floor mm, hip weakness, poor body mechanics which places strain on the abdominal/pelvic floor mm. These are deficits that indicate an ineffective intraabdominal pressure system associated with increased risk for pt's Sx.   Pt will benefit from coordination training and education on fitness and functional positions in order to gain  a more effective intraabdominal pressure system to minimize urinary leakage.  Pt was provided education on etiology of Sx with anatomy, physiology explanation with images along with the benefits of customized pelvic PT Tx based on pt's medical conditions and musculoskeletal deficits.  Explained the physiology of deep core mm coordination and roles of pelvic floor function in urination, defecation, sexual function, and postural control with deep core mm system.   Regional interdependent approaches will yield greater benefits in pt's POC.   Following Tx today which pt tolerated without complaints,  pt demo'd proper body mechanics to minimize straining pelvic floor.  Plan to address realignment of spine/ pelvis at next session to help promote optimize IAP system for improved pelvic floor function, trunk stability, gait, balance, stabilization with mobility tasks.  Plan to address pelvic floor issues once pelvis and spine are realigned to yield better outcomes.   Pt benefits from skilled PT.    OBJECTIVE IMPAIRMENTS decreased activity tolerance, decreased coordination, decreased endurance, decreased mobility, difficulty walking, decreased ROM, decreased strength,  decreased safety awareness, hypomobility, increased muscle spasms, impaired flexibility, improper body mechanics, postural dysfunction,  ACTIVITY LIMITATIONS  self-care,  sleep, home chores, work tasks    PARTICIPATION LIMITATIONS:  community, gym activities    PERSONAL FACTORS        are also affecting patient's functional outcome.    REHAB POTENTIAL: Good   CLINICAL DECISION MAKING: Evolving/moderate complexity   EVALUATION COMPLEXITY: Moderate    PATIENT EDUCATION:    Education details: Showed pt anatomy images. Explained muscles attachments/ connection, physiology of deep core system/ spinal- thoracic-pelvis-lower kinetic chain as they relate to pt's presentation, Sx, and past Hx. Explained what and how these areas of deficits need to be restored to balance and function    See Therapeutic activity / neuromuscular re-education section  Answered pt's questions.   Person educated: Patient Education method: Explanation, Demonstration, Tactile cues, Verbal cues, and Handouts Education comprehension: verbalized understanding, returned demonstration, verbal cues required, tactile cues required, and needs further education     PLAN: PT FREQUENCY: 1x/week   PT DURATION: 10 weeks   PLANNED INTERVENTIONS: Therapeutic exercises, Therapeutic activity, Neuromuscular re-education, Balance training, Gait training, Patient/Family education, Self Care, Joint mobilization, Spinal mobilization, Moist heat, Taping, and Manual therapy, dry needling.   PLAN FOR NEXT SESSION: See clinical impression for plan     GOALS: Goals reviewed with patient? Yes  SHORT TERM GOALS: Target date: 04/30/2023    1. Pt will demo IND with HEP                    Baseline: Not IND            Goal status: INITIAL   LONG TERM GOALS: Target date: 06/11/2023    1.Pt will demo proper deep core coordination without chest breathing and optimal excursion of diaphragm/pelvic floor in order to promote spinal  stability and pelvic floor function  Baseline: dyscoordination Goal status: INITIAL  2.  Pt will demo > 5 pt change on FOTO  to improve QOL and function   PFDI Urinary baseline - 13 Lower score = better function   Urinary Problem baseline-  57  Higher score = better function    Goal status: INITIAL  3.  Pt will demo proper body mechanics in against gravity tasks and ADLs  work tasks, fitness  to minimize straining pelvic floor / back    Baseline: not IND, improper form that places strain on pelvic floor  Goal  status: INITIAL    4. Pt will demo increased gait speed > 1.3 m/s with reciprocal gait pattern, longer stride length  in order to ambulate safely in community and return to fitness routine  Baseline: 1.2 m/s, R trunk leanm decreased stance on R LE  Goal status: INITIAL    5. Pt will demo levelled pelvic girdle and shoulder height in order to progress to deep core strengthening HEP and restore mobility at spine, pelvis, gait, posture minimize falls, and improve balance  Baseline:  R shoulder, R pelvic lowered  Goal status: INITIAL   Mariane Masters, PT 04/02/2023, 2:46 PM

## 2023-04-02 NOTE — Patient Instructions (Signed)
Avoid straining pelvic floor, abdominal muscles , spine  Use log rolling technique instead of getting out of bed with your neck or the sit-up    Proper body mechanics with getting out of a chair to decrease strain  on back &pelvic floor   Avoid holding your breath when Getting out of the chair:  Scoot to front part of chair chair Heels behind knees, feet are hip width apart, nose over toes  Inhale like you are smelling roses Exhale to stand   __  Sitting with feet on ground, four points of contact Catch yourself crossing ankles and thighs  __

## 2023-04-09 ENCOUNTER — Ambulatory Visit: Payer: Medicare Other | Admitting: Physical Therapy

## 2023-04-09 DIAGNOSIS — R278 Other lack of coordination: Secondary | ICD-10-CM

## 2023-04-09 DIAGNOSIS — M533 Sacrococcygeal disorders, not elsewhere classified: Secondary | ICD-10-CM | POA: Diagnosis not present

## 2023-04-09 DIAGNOSIS — R2689 Other abnormalities of gait and mobility: Secondary | ICD-10-CM

## 2023-04-09 NOTE — Therapy (Signed)
OUTPATIENT PHYSICAL THERAPY  TREATMENT    Patient Name: Brian Bean MRN: [id number] DOB:07-23-1940, 82 y.o., male Today's Date: 04/09/2023   PT End of Session - 04/09/23 1431     Visit Number 2    Number of Visits 10    Date for PT Re-Evaluation 06/11/23    PT Start Time 1420    PT Stop Time 1503   PT Time Calculation (min) 38 min    Activity Tolerance Patient tolerated treatment well;No increased pain    Behavior During Therapy WFL for tasks assessed/performed             Past Medical History:  Diagnosis Date   Anxiety    Arthritis    Cancer (HCC)    skin lesion scalp   CLL (chronic lymphocytic leukemia) (HCC) 01/06/2013   CLL (chronic lymphocytic leukemia) (HCC)    Depression    Head injury    fell from a horse   Hypertension    Lymphocytosis    PONV (postoperative nausea and vomiting)    extremely sick did not get sick after hip surgery in 2013   Prostate cancer [facility])    Past Surgical History:  Procedure Laterality Date   APPENDECTOMY     BRONCHIAL BIOPSY  04/16/2021   Procedure: BRONCHIAL BIOPSIES;  Surgeon: Josephine Igo, DO;  [facility];  Service: Pulmonary;;   BRONCHIAL BRUSHINGS  04/16/2021   Procedure: BRONCHIAL BRUSHINGS;  Surgeon: Josephine Igo, DO;  [facility];  Service: Pulmonary;;   BRONCHIAL NEEDLE ASPIRATION BIOPSY  04/16/2021   Procedure: BRONCHIAL NEEDLE ASPIRATION BIOPSIES;  Surgeon: Josephine Igo, DO;  [facility];  Service: Pulmonary;;   BRONCHIAL WASHINGS  04/16/2021   Procedure: BRONCHIAL WASHINGS;  Surgeon: Josephine Igo, DO;  [facility];  Service: Pulmonary;;   CATARACT EXTRACTION, BILATERAL     CERVICAL DISC SURGERY  00   COLONOSCOPY  2011   [facility].    FRACTURE SURGERY     rt ankle   HERNIA REPAIR     rt and  undistended testicle   PROSTATE BIOPSY     REVERSE SHOULDER ARTHROPLASTY Right 05/01/2020   Procedure: REVERSE SHOULDER ARTHROPLASTY;  Surgeon: Christena Flake, MD;   Location: [facility];  Service: Orthopedics;  Laterality: Right;   rt hip  12   pinned   TOTAL HIP ARTHROPLASTY  05/03/2012   Procedure: TOTAL HIP ARTHROPLASTY;  Surgeon: [name], MD;  [facility];  Service: Orthopedics;  Laterality: Right;   VIDEO BRONCHOSCOPY WITH ENDOBRONCHIAL NAVIGATION Right 04/16/2021   Procedure: VIDEO BRONCHOSCOPY WITH ENDOBRONCHIAL NAVIGATION;  Surgeon: Josephine Igo, DO;  [facility];  Service: Pulmonary;  Laterality: Right;  ION w/ CIOS   VIDEO BRONCHOSCOPY WITH RADIAL ENDOBRONCHIAL ULTRASOUND  04/16/2021   Procedure: RADIAL ENDOBRONCHIAL ULTRASOUND;  Surgeon: Josephine Igo, DO;  [facility];  Service: Pulmonary;;   Patient Active Problem List   Diagnosis Date Noted   Hypokalemia 03/26/2023   Fatigue 12/25/2022   Encounter for monitoring androgen deprivation therapy 09/25/2022   Encounter for antineoplastic chemotherapy 07/15/2022   Left arm weakness 01/28/2022   Pneumothorax 01/27/2022   Wheeze 01/27/2022   Hyperlipidemia, unspecified 01/27/2022   Acute respiratory failure with hypoxia (HCC) 01/27/2022   Multiple rib fractures 01/26/2022   Arthritis 01/26/2022   Hypertension 01/26/2022   Goals of care, counseling/discussion 10/08/2021   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) 03/08/2021   Lung nodules 03/08/2021   History  of skin cancer 08/20/2017   Overweight (BMI 25.0-29.9) 08/20/2017   Prostate cancer (HCC) 06/24/2016   Lymphadenopathy, abdominal 06/24/2016   Thrombocytopenia (HCC) 06/26/2014   CLL (chronic lymphocytic leukemia) (HCC) 01/06/2013   Avascular necrosis of right hip 05/05/2012    PCP: Orson Aloe   REFERRING PROVIDER: Leandro Reasoner DIAG: urgency of urination   Rationale for Evaluation and Treatment Rehabilitation  THERAPY DIAG:  Sacrococcygeal disorders, not elsewhere classified  Other lack of coordination  Other abnormalities of gait and mobility  ONSET DATE:    SUBJECTIVE STATEMENT TODAY:  Pt had a question about log rolling       SUBJECTIVE STATEMENT ON EVAL 04/02/23 :  urinary frequency:  Pt is on a medication that helped with urgency. Currently frequency is an issue. Pt has to  urinate 4-5 x within the first 2-3 hours upon waking 6:45 -7am.  After the 12 pm, pt's frequency decreases to 2 x within 2 hours. Started after oral Tx of cancer in the lung.   Pt is taking direutic medications.  Nocturia:  1-2 x night.     Physical activities:  goes to the [facility] for water aerobics    PERTINENT HISTORY:   -fall with R rib Fx 2022 -Radiation 2020 for Prostate Cancer  -Currently taking medications to treat the prostate cancer in the lungs  -Hx of skin CA, chronic lymphocytic leukemia -Cervical disc surgery -Appendectomy -Inguinal hernia repair  -R THA with pin in R hip  -R ankle with pin -R shoulder arthroplasty   PAIN:  Are you having pain? No   PRECAUTIONS:        Active Cancer   WEIGHT BEARING RESTRICTIONS: No   FALLS:  Has patient fallen in last 6 months? 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,    PLOF: Independent  PATIENT GOALS:   Strengthen muscles to cut down the morning urinary frequency    OBJECTIVE:      [facility] PT Assessment - 04/09/23 1431       Observation/Other Assessments   Observations limited lateral rib excursion      Palpation   SI assessment  R shoulder / R pelvis lowered  ( post Tx with shoe lift in toe box and h eel in R shoe, levelled pelvis and shoulder    Palpation comment tightness along medial scap, cervical , shoulders, intercostals  B      Ambulation/Gait   Gait Comments 1.23 m/s  , without Trunk R lean             [facility] Adult PT Treatment/Exercise - 04/09/23 1431       Therapeutic Activites     Other Therapeutic Activities reivewed logrolling and scooting in bed , placed shoe lift shoe and reassessed gait      Neuro Re-ed    Neuro Re-ed Details  cued for less abd overuse with diaphragm, cued for neck ROM and scapular ROM to  minimize forward head and optimize diaphragm      Modalities   Modalities Moist Heat      Moist Heat Therapy   Number Minutes Moist Heat 5 Minutes    Moist Heat Location --   thoracic, supported pillow under knees     Manual Therapy   Manual therapy comments STM/MWM  at problem areas noted in assessment to promote optimal diaphragm excursion for deep core training preparation               HOME EXERCISE PROGRAM: See pt instruction section    ASSESSMENT:  CLINICAL IMPRESSION:     Following Tx today which pt tolerated without complaints,  pt demo'd improved gait and levelled pelvis and spine with shoe lift in toe box and heel of R shoe.    Addressed with manual Tx to spine/ pelvis at next session to help promote promote optimal diaphragm excursion for deep core training preparation and optimize IAP system for improved pelvic floor function, trunk stability, gait, balance, stabilization with mobility tasks.     Pt demo'd improved diaphragmatic excursion.  Modified technique with manual Tx with R shoulder pain., post Tx to thoracic spine provided increased scapular mobility and more arm ROM on R. Plan to continue with manual Tx , address hypomobility to cervical and C/T junction at next session.   Pt benefits from skilled PT.    OBJECTIVE IMPAIRMENTS decreased activity tolerance, decreased coordination, decreased endurance, decreased mobility, difficulty walking, decreased ROM, decreased strength, decreased safety awareness, hypomobility, increased muscle spasms, impaired flexibility, improper body mechanics, postural dysfunction,  ACTIVITY LIMITATIONS  self-care,  sleep, home chores, work tasks    PARTICIPATION LIMITATIONS:  community, gym  activities    PERSONAL FACTORS        are also affecting patient's functional outcome.    REHAB POTENTIAL: Good   CLINICAL DECISION MAKING: Evolving/moderate complexity   EVALUATION COMPLEXITY: Moderate    PATIENT EDUCATION:    Education details: Showed pt anatomy images. Explained muscles attachments/ connection, physiology of deep core system/ spinal- thoracic-pelvis-lower kinetic chain as they relate to pt's presentation, Sx, and past Hx. Explained what and how these areas of deficits need to be restored to balance and function    See Therapeutic activity / neuromuscular re-education section  Answered pt's questions.   Person educated: Patient Education method: Explanation, Demonstration, Tactile cues, Verbal cues, and Handouts Education comprehension: verbalized understanding, returned demonstration, verbal cues required, tactile cues required, and needs further education     PLAN: PT FREQUENCY: 1x/week   PT DURATION: 10 weeks   PLANNED INTERVENTIONS: Therapeutic exercises, Therapeutic activity, Neuromuscular re-education, Balance training, Gait training, Patient/Family education, Self Care, Joint mobilization, Spinal mobilization, Moist heat, Taping, and Manual therapy, dry needling.   PLAN FOR NEXT SESSION: See clinical impression for plan     GOALS: Goals reviewed with patient? Yes  SHORT TERM GOALS: Target date: 04/30/2023    1. Pt will demo IND with HEP                    Baseline: Not IND            Goal status: INITIAL   LONG TERM GOALS: Target date: 06/11/2023    1.Pt will demo proper deep core coordination without chest breathing and optimal excursion of diaphragm/pelvic floor in order to promote spinal stability and pelvic floor function  Baseline: dyscoordination Goal status: INITIAL  2.  Pt will demo > 5 pt change on FOTO  to improve QOL and function   PFDI Urinary baseline - 13 Lower score = better function   Urinary Problem baseline-  57   Higher score = better function    Goal status: INITIAL  3.  Pt will demo proper body mechanics in against gravity tasks and ADLs  work tasks, fitness  to minimize straining  pelvic floor / back    Baseline: not IND, improper form that places strain on pelvic floor  Goal status: INITIAL    4. Pt will demo increased gait speed > 1.3 m/s with reciprocal gait pattern, longer stride length  in order to ambulate safely in community and return to fitness routine  Baseline: 1.2 m/s, R trunk leanm decreased stance on R LE  Goal status: INITIAL    5. Pt will demo levelled pelvic girdle and shoulder height in order to progress to deep core strengthening HEP and restore mobility at spine, pelvis, gait, posture minimize falls, and improve balance  Baseline:  R shoulder, R pelvic lowered  Goal status: INITIAL   Mariane Masters, PT 04/09/2023, 3:03 PM

## 2023-04-09 NOTE — Patient Instructions (Signed)
   Neck / shoulder stretches:    Lying on back - small sushi roll towel under neck  _ 6 directions   Chin up, down Rotation like changing lanes when driving Ear to shoulder like puppy dog   5 reps     _angel wings, lower elbows down , keep arms touching bed  10 reps

## 2023-04-12 ENCOUNTER — Encounter: Payer: Self-pay | Admitting: Physical Therapy

## 2023-04-16 ENCOUNTER — Ambulatory Visit: Payer: Medicare Other | Attending: Urology | Admitting: Physical Therapy

## 2023-04-16 DIAGNOSIS — R2689 Other abnormalities of gait and mobility: Secondary | ICD-10-CM | POA: Diagnosis not present

## 2023-04-16 DIAGNOSIS — R278 Other lack of coordination: Secondary | ICD-10-CM | POA: Insufficient documentation

## 2023-04-16 DIAGNOSIS — M533 Sacrococcygeal disorders, not elsewhere classified: Secondary | ICD-10-CM | POA: Insufficient documentation

## 2023-04-16 NOTE — Therapy (Addendum)
OUTPATIENT PHYSICAL THERAPY  TREATMENT    Patient Name: Brian Bean MRN: [id number] DOB:07/16/1940, 82 y.o., male Today's Date: 04/16/2023   PT End of Session - 04/16/23     Visit Number 3    Number of Visits 10    Date for PT Re-Evaluation 06/11/23    PT Start Time 1420    PT Stop Time 1505   PT Time Calculation (min) 45 min    Activity Tolerance Patient tolerated treatment well;No increased pain    Behavior During Therapy WFL for tasks assessed/performed             Past Medical History:  Diagnosis Date   Anxiety    Arthritis    Cancer (HCC)    skin lesion scalp   CLL (chronic lymphocytic leukemia) (HCC) 01/06/2013   CLL (chronic lymphocytic leukemia) (HCC)    Depression    Head injury    fell from a horse   Hypertension    Lymphocytosis    PONV (postoperative nausea and vomiting)    extremely sick did not get sick after hip surgery in 2013   Prostate cancer [facility])    Past Surgical History:  Procedure Laterality Date   APPENDECTOMY     BRONCHIAL BIOPSY  04/16/2021   Procedure: BRONCHIAL BIOPSIES;  Surgeon: Josephine Igo, DO;  [facility]: [facility];  Service: Pulmonary;;   BRONCHIAL BRUSHINGS  04/16/2021   Procedure: BRONCHIAL BRUSHINGS;  Surgeon: Josephine Igo, DO;  [facility]: [facility];  Service: Pulmonary;;   BRONCHIAL NEEDLE ASPIRATION BIOPSY  04/16/2021   Procedure: BRONCHIAL NEEDLE ASPIRATION BIOPSIES;  Surgeon: Josephine Igo, DO;  [facility];  Service: Pulmonary;;   BRONCHIAL WASHINGS  04/16/2021   Procedure: BRONCHIAL WASHINGS;  Surgeon: Josephine Igo, DO;  [facility]: [facility];  Service: Pulmonary;;   CATARACT EXTRACTION, BILATERAL     CERVICAL DISC SURGERY  00   COLONOSCOPY  2011   [facility].    FRACTURE SURGERY     rt ankle   HERNIA REPAIR     rt and  undistended testicle   PROSTATE BIOPSY     REVERSE SHOULDER ARTHROPLASTY Right 05/01/2020   Procedure: REVERSE SHOULDER ARTHROPLASTY;  Surgeon: Christena Flake, MD;   Location: [facility];  Service: Orthopedics;  Laterality: Right;   rt hip  12   pinned   TOTAL HIP ARTHROPLASTY  05/03/2012   Procedure: TOTAL HIP ARTHROPLASTY;  Surgeon: [name], MD;  [facility]: [facility];  Service: Orthopedics;  Laterality: Right;   VIDEO BRONCHOSCOPY WITH ENDOBRONCHIAL NAVIGATION Right 04/16/2021   Procedure: VIDEO BRONCHOSCOPY WITH ENDOBRONCHIAL NAVIGATION;  Surgeon: Josephine Igo, DO;  [facility];  Service: Pulmonary;  Laterality: Right;  ION w/ CIOS   VIDEO BRONCHOSCOPY WITH RADIAL ENDOBRONCHIAL ULTRASOUND  04/16/2021   Procedure: RADIAL ENDOBRONCHIAL ULTRASOUND;  Surgeon: Josephine Igo, DO;  [facility];  Service: Pulmonary;;   Patient Active Problem List   Diagnosis Date Noted   Hypokalemia 03/26/2023   Fatigue 12/25/2022   Encounter for monitoring androgen deprivation therapy 09/25/2022   Encounter for antineoplastic chemotherapy 07/15/2022   Left arm weakness 01/28/2022   Pneumothorax 01/27/2022   Wheeze 01/27/2022   Hyperlipidemia, unspecified 01/27/2022   Acute respiratory failure with hypoxia (HCC) 01/27/2022   Multiple rib fractures 01/26/2022   Arthritis 01/26/2022   Hypertension 01/26/2022   Goals of care, counseling/discussion 10/08/2021   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  ONSET DATE:   SUBJECTIVE STATEMENT TODAY:  Pt had a question about log rolling       SUBJECTIVE STATEMENT ON EVAL  04/02/23 :  urinary frequency:  Pt is on a medication that helped with urgency. Currently frequency is an issue. Pt has to  urinate 4-5 x within the first 2-3 hours upon waking 6:45 -7am.  After the 12 pm, pt's frequency decreases to 2 x within 2 hours. Started after oral Tx of cancer in the lung.   Pt is taking direutic medications.  Nocturia:  1-2 x night.     Physical activities:  goes to the [facility] for water aerobics    PERTINENT HISTORY:   -fall with R rib Fx 2022 -Radiation 2020 for Prostate Cancer  -Currently taking medications to treat the prostate cancer in the lungs  -Hx of skin CA, chronic lymphocytic leukemia -Cervical disc surgery -Appendectomy -Inguinal hernia repair  -R THA with pin in R hip  -R ankle with pin -R shoulder arthroplasty   PAIN:  Are you having pain? No   PRECAUTIONS:        Active Cancer   WEIGHT BEARING RESTRICTIONS: No   FALLS:  Has patient fallen in last 6 months? 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,    PLOF: Independent  PATIENT GOALS:   Strengthen muscles to cut down the morning urinary frequency    OBJECTIVE:      [facility] PT Assessment - 04/16/23 2318       Coordination   Coordination and Movement Description chest breathing, improved lateral diaphragm excursion when cued, c/o shoulder pain B with hand propiception cues on upper/ lower ab during deep core training      Palpation   SI assessment  levelled shoulder/ pelvis    Palpation comment deviated C7/T1 L, tightness along occiput mm attachments L > R , R medial scapula             [facility] Adult PT Treatment/Exercise - 04/09/23 1431       Therapeutic Activites    Other Therapeutic Activities reivewed logrolling and scooting in bed , placed shoe lift shoe and reassessed gait       Neuro Re-ed    Neuro Re-ed Details  cued for less abd overuse with diaphragm, cued for neck ROM and scapular ROM to  minimize forward head and optimize diaphragm      Modalities   Modalities Moist Heat      Moist Heat Therapy   Number Minutes Moist Heat 5 Minutes    Moist Heat Location --   thoracic, supported pillow under knees     Manual Therapy   Manual therapy comments STM/MWM at problem areas noted in assessment to promote optimal diaphragm excursion for deep core training preparation  HOME EXERCISE PROGRAM: See pt instruction section    ASSESSMENT:  CLINICAL IMPRESSION:    Pt demo'd improved gait and levelled pelvis and spine with shoe lift in toe box and heel of R shoe and had no complaints the past week.   Continued to minimize forward head posture, minimize shoulder complaints in deep core training with manual Tx to cervical/ C/T junction. Pt had no more shoulder c/o after manual Tx and was able to perform deep core training without issues.  Pt required cues for deep core level 1-2 HEP to promote less chest breathing and more pelvic stability  These improvements today will help optimize IAP system for improved pelvic floor function, trunk stability, gait, balance, stabilization with mobility tasks.    Plan to continue with manual Tx , address hypomobility to cervical and C/T junction at next session. Add cervicoscapular strengthening at next session.   Pt benefits from skilled PT.    OBJECTIVE IMPAIRMENTS decreased activity tolerance, decreased coordination, decreased endurance, decreased mobility, difficulty walking, decreased ROM, decreased strength, decreased safety awareness, hypomobility, increased muscle spasms, impaired flexibility, improper body mechanics, postural dysfunction,  ACTIVITY LIMITATIONS  self-care,  sleep, home chores, work tasks    PARTICIPATION LIMITATIONS:  community, gym activities    PERSONAL FACTORS        are also  affecting patient's functional outcome.    REHAB POTENTIAL: Good   CLINICAL DECISION MAKING: Evolving/moderate complexity   EVALUATION COMPLEXITY: Moderate    PATIENT EDUCATION:    Education details: Showed pt anatomy images. Explained muscles attachments/ connection, physiology of deep core system/ spinal- thoracic-pelvis-lower kinetic chain as they relate to pt's presentation, Sx, and past Hx. Explained what and how these areas of deficits need to be restored to balance and function    See Therapeutic activity / neuromuscular re-education section  Answered pt's questions.   Person educated: Patient Education method: Explanation, Demonstration, Tactile cues, Verbal cues, and Handouts Education comprehension: verbalized understanding, returned demonstration, verbal cues required, tactile cues required, and needs further education     PLAN: PT FREQUENCY: 1x/week   PT DURATION: 10 weeks   PLANNED INTERVENTIONS: Therapeutic exercises, Therapeutic activity, Neuromuscular re-education, Balance training, Gait training, Patient/Family education, Self Care, Joint mobilization, Spinal mobilization, Moist heat, Taping, and Manual therapy, dry needling.   PLAN FOR NEXT SESSION: See clinical impression for plan     GOALS: Goals reviewed with patient? Yes  SHORT TERM GOALS: Target date: 04/30/2023    1. Pt will demo IND with HEP                    Baseline: Not IND            Goal status: INITIAL   LONG TERM GOALS: Target date: 06/11/2023    1.Pt will demo proper deep core coordination without chest breathing and optimal excursion of diaphragm/pelvic floor in order to promote spinal stability and pelvic floor function  Baseline: dyscoordination Goal status: INITIAL  2.  Pt will demo > 5 pt change on FOTO  to improve QOL and function   PFDI Urinary baseline - 13 Lower score = better function   Urinary Problem baseline-  57  Higher score = better function    Goal  status: INITIAL  3.  Pt will demo proper body mechanics in against gravity tasks and ADLs  work tasks, fitness  to minimize straining pelvic floor / back    Baseline: not IND, improper form that places strain on pelvic floor  Goal status: INITIAL    4. Pt will demo increased gait speed > 1.3 m/s with reciprocal gait pattern, longer stride length  in order to ambulate safely in community and return to fitness routine  Baseline: 1.2 m/s, R trunk leanm decreased stance on R LE  Goal status: INITIAL    5. Pt will demo levelled pelvic girdle and shoulder height in order to progress to deep core strengthening HEP and restore mobility at spine, pelvis, gait, posture minimize falls, and improve balance  Baseline:  R shoulder, R pelvic lowered  Goal status: INITIAL   Mariane Masters, PT 04/16/2023, 11:23 PM 3

## 2023-04-23 ENCOUNTER — Ambulatory Visit: Payer: Medicare Other | Admitting: Physical Therapy

## 2023-04-23 DIAGNOSIS — R2689 Other abnormalities of gait and mobility: Secondary | ICD-10-CM | POA: Diagnosis not present

## 2023-04-23 DIAGNOSIS — R278 Other lack of coordination: Secondary | ICD-10-CM

## 2023-04-23 DIAGNOSIS — M533 Sacrococcygeal disorders, not elsewhere classified: Secondary | ICD-10-CM

## 2023-04-23 NOTE — Therapy (Addendum)
OUTPATIENT PHYSICAL THERAPY  TREATMENT    Patient Name: Brian Bean MRN: [id number] DOB:02-13-1941, 82 y.o., male Today's Date: 04/23/2023   PT End of Session - 04/23/23      Visit Number 4   Number of Visits 10    Date for PT Re-Evaluation 06/11/23    PT Start Time 1420    PT Stop Time 1500   PT Time Calculation (min) 40 min    Activity Tolerance Patient tolerated treatment well;No increased pain    Behavior During Therapy WFL for tasks assessed/performed             Past Medical History:  Diagnosis Date   Anxiety    Arthritis    Cancer (HCC)    skin lesion scalp   CLL (chronic lymphocytic leukemia) (HCC) 01/06/2013   CLL (chronic lymphocytic leukemia) (HCC)    Depression    Head injury    fell from a horse   Hypertension    Lymphocytosis    PONV (postoperative nausea and vomiting)    extremely sick did not get sick after hip surgery in 2013   Prostate cancer [facility])    Past Surgical History:  Procedure Laterality Date   APPENDECTOMY     BRONCHIAL BIOPSY  04/16/2021   Procedure: BRONCHIAL BIOPSIES;  Surgeon: Josephine Igo, DO;  [facility];  Service: Pulmonary;;   BRONCHIAL BRUSHINGS  04/16/2021   Procedure: BRONCHIAL BRUSHINGS;  Surgeon: Josephine Igo, DO;  [facility];  Service: Pulmonary;;   BRONCHIAL NEEDLE ASPIRATION BIOPSY  04/16/2021   Procedure: BRONCHIAL NEEDLE ASPIRATION BIOPSIES;  Surgeon: Josephine Igo, DO;  [facility];  Service: Pulmonary;;   BRONCHIAL WASHINGS  04/16/2021   Procedure: BRONCHIAL WASHINGS;  Surgeon: Josephine Igo, DO;  [facility];  Service: Pulmonary;;   CATARACT EXTRACTION, BILATERAL     CERVICAL DISC SURGERY  00   COLONOSCOPY  2011   [facility].    FRACTURE SURGERY     rt ankle   HERNIA REPAIR     rt and  undistended testicle   PROSTATE BIOPSY     REVERSE SHOULDER ARTHROPLASTY Right 05/01/2020   Procedure: REVERSE SHOULDER ARTHROPLASTY;  Surgeon: Christena Flake, MD;   Location: [facility] OR[facility];  Service: Orthopedics;  Laterality: Right;   rt hip  12   pinned   TOTAL HIP ARTHROPLASTY  05/03/2012   Procedure: TOTAL HIP ARTHROPLASTY;  Surgeon: [name], MD;  [facility];  Service: Orthopedics;  Laterality: Right;   VIDEO BRONCHOSCOPY WITH ENDOBRONCHIAL NAVIGATION Right 04/16/2021   Procedure: VIDEO BRONCHOSCOPY WITH ENDOBRONCHIAL NAVIGATION;  Surgeon: Josephine Igo, DO;  [facility];  Service: Pulmonary;  Laterality: Right;  ION w/ CIOS   VIDEO BRONCHOSCOPY WITH RADIAL ENDOBRONCHIAL ULTRASOUND  04/16/2021   Procedure: RADIAL ENDOBRONCHIAL ULTRASOUND;  Surgeon: Josephine Igo, DO;  [facility]: [facility];  Service: Pulmonary;;   Patient Active Problem List   Diagnosis Date Noted   Hypokalemia 03/26/2023   Fatigue 12/25/2022   Encounter for monitoring androgen deprivation therapy 09/25/2022   Encounter for antineoplastic chemotherapy 07/15/2022   Left arm weakness 01/28/2022   Pneumothorax 01/27/2022   Wheeze 01/27/2022   Hyperlipidemia, unspecified 01/27/2022   Acute respiratory failure with hypoxia (HCC) 01/27/2022   Multiple rib fractures 01/26/2022   Arthritis 01/26/2022   Hypertension 01/26/2022   Goals of care, counseling/discussion 10/08/2021   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) 03/08/2021   Lung nodules 03/08/2021   History of  skin cancer 08/20/2017   Overweight (BMI 25.0-29.9) 08/20/2017   Prostate cancer (HCC) 06/24/2016   Lymphadenopathy, abdominal 06/24/2016   Thrombocytopenia (HCC) 06/26/2014   CLL (chronic lymphocytic leukemia) (HCC) 01/06/2013   Avascular necrosis of right hip 05/05/2012    PCP: Lester Kinsman PROVIDER: Leandro Reasoner DIAG: urgency of urination   Rationale for Evaluation and Treatment Rehabilitation  THERAPY DIAG:  Sacrococcygeal disorders, not elsewhere classified  Other lack of coordination  Other abnormalities of gait and mobility  ONSET DATE:    SUBJECTIVE STATEMENT TODAY:  Pt notices when he is not doing his techniques with sit to stand.  Leakage has improved 35-40% .       SUBJECTIVE STATEMENT ON EVAL 04/02/23 :  urinary frequency:  Pt is on a medication that helped with urgency. Currently frequency is an issue. Pt has to  urinate 4-5 x within the first 2-3 hours upon waking 6:45 -7am.  After the 12 pm, pt's frequency decreases to 2 x within 2 hours. Started after oral Tx of cancer in the lung.   Pt is taking direutic medications.  Nocturia:  1-2 x night.     Physical activities:  goes to the [facility] for water aerobics    PERTINENT HISTORY:   -fall with R rib Fx 2022 -Radiation 2020 for Prostate Cancer  -Currently taking medications to treat the prostate cancer in the lungs  -Hx of skin CA, chronic lymphocytic leukemia -Cervical disc surgery -Appendectomy -Inguinal hernia repair  -R THA with pin in R hip  -R ankle with pin -R shoulder arthroplasty   PAIN:  Are you having pain? No   PRECAUTIONS:        Active Cancer   WEIGHT BEARING RESTRICTIONS: No   FALLS:  Has patient fallen in last 6 months? 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,    PLOF: Independent  PATIENT GOALS:   Strengthen muscles to cut down the morning urinary frequency    OBJECTIVE:     [facility] PT Assessment - 04/23/23 1621       Coordination   Coordination and Movement Description dyscoordination with sit to stand      Ambulation/Gait   Gait Comments 1.3 m/s reciprocal gait with shoe lift,  short strides, minimal hip flexion, and presence of heeling striking             [facility] Adult PT Treatment/Exercise - 04/23/23 1620       Therapeutic Activites    Other Therapeutic Activities guided pt on how to put shoe lifts in two pairs of shoes ,  assessed goals, administered FOTO , assessed gait      Neuro Re-ed    Neuro Re-ed Details  cued for correct sit to stand technique with proper coordination, cued for gait training for higher knees, loger strides, less heel striking               HOME EXERCISE PROGRAM: See pt instruction section    ASSESSMENT:  CLINICAL IMPRESSION:      Pt's gait improved with shoe lift in R shoe and showed reciprocal gait pattern. Provided education and showed pt on how to place more shoe lifts in other shoes.  Cued for correct sit to stand technique with proper coordination, cued for gait training for higher knees, loger strides, less heel striking  Plan add cervicoscapular strengthening at next session. Plan to add kegels if approriate  Pt benefits from skilled PT.    OBJECTIVE IMPAIRMENTS decreased activity tolerance, decreased coordination, decreased endurance, decreased mobility, difficulty walking, decreased ROM, decreased strength, decreased safety awareness, hypomobility, increased muscle spasms, impaired flexibility, improper body mechanics, postural dysfunction,  ACTIVITY LIMITATIONS  self-care,  sleep, home chores, work tasks    PARTICIPATION LIMITATIONS:  community, gym activities    PERSONAL FACTORS        are also affecting patient's functional outcome.    REHAB POTENTIAL: Good   CLINICAL DECISION MAKING: Evolving/moderate complexity   EVALUATION COMPLEXITY: Moderate    PATIENT EDUCATION:    Education details: Showed pt anatomy images. Explained muscles attachments/ connection, physiology of deep core system/ spinal- thoracic-pelvis-lower kinetic chain as they relate to pt's presentation, Sx, and past Hx. Explained what and how these areas of deficits need to be restored to balance and function    See Therapeutic activity / neuromuscular re-education section  Answered pt's questions.   Person educated: Patient Education method: Explanation,  Demonstration, Tactile cues, Verbal cues, and Handouts Education comprehension: verbalized understanding, returned demonstration, verbal cues required, tactile cues required, and needs further education     PLAN: PT FREQUENCY: 1x/week   PT DURATION: 10 weeks   PLANNED INTERVENTIONS: Therapeutic exercises, Therapeutic activity, Neuromuscular re-education, Balance training, Gait training, Patient/Family education, Self Care, Joint mobilization, Spinal mobilization, Moist heat, Taping, and Manual therapy, dry needling.   PLAN FOR NEXT SESSION: See clinical impression for plan     GOALS: Goals reviewed with patient? Yes  SHORT TERM GOALS: Target date: 04/30/2023    1. Pt will demo IND with HEP                    Baseline: Not IND            Goal status:MET    LONG TERM GOALS: Target date: 06/11/2023    1.Pt will demo proper deep core coordination without chest breathing and optimal excursion of diaphragm/pelvic floor in order to promote spinal stability and pelvic floor function  Baseline: dyscoordination Goal status: INITIAL  2.  Pt will demo > 5 pt change on FOTO  to improve QOL and function   PFDI Urinary baseline - 13 Lower score = better function   Urinary Problem baseline-  57  Higher score = better function    Goal status: INITIAL  3.  Pt will demo proper body mechanics in against gravity tasks and ADLs  work tasks, fitness  to minimize straining pelvic floor / back    Baseline: not IND, improper form that places strain on pelvic floor  Goal status: ONGOING     4. Pt will demo increased gait speed > 1.3 m/s with reciprocal gait pattern, longer stride length  in order to ambulate safely in community and return to fitness routine  Baseline: 1.2 m/s, R trunk leanm decreased stance on R LE  Goal status: MET  ( 11/14:   1.34 m/s with shoe lifts in R )     5. Pt will demo levelled pelvic girdle and shoulder height in order to progress to deep core strengthening  HEP and restore mobility at spine, pelvis, gait, posture minimize falls, and improve balance  Baseline:  R shoulder, R pelvic lowered  Goal status: MET  (  11/14: with shoe lifts in R )   Mariane Masters, PT 04/23/2023, 2:47 PM 3

## 2023-04-28 ENCOUNTER — Encounter: Payer: Self-pay | Admitting: Physical Therapy

## 2023-04-28 DIAGNOSIS — S299XXA Unspecified injury of thorax, initial encounter: Secondary | ICD-10-CM | POA: Diagnosis not present

## 2023-04-28 DIAGNOSIS — W010XXA Fall on same level from slipping, tripping and stumbling without subsequent striking against object, initial encounter: Secondary | ICD-10-CM | POA: Diagnosis not present

## 2023-04-28 DIAGNOSIS — R918 Other nonspecific abnormal finding of lung field: Secondary | ICD-10-CM | POA: Diagnosis not present

## 2023-04-28 DIAGNOSIS — R0781 Pleurodynia: Secondary | ICD-10-CM | POA: Diagnosis not present

## 2023-04-28 DIAGNOSIS — S2242XA Multiple fractures of ribs, left side, initial encounter for closed fracture: Secondary | ICD-10-CM | POA: Diagnosis not present

## 2023-04-29 DIAGNOSIS — Z961 Presence of intraocular lens: Secondary | ICD-10-CM | POA: Diagnosis not present

## 2023-04-29 DIAGNOSIS — H52223 Regular astigmatism, bilateral: Secondary | ICD-10-CM | POA: Diagnosis not present

## 2023-04-29 DIAGNOSIS — H5203 Hypermetropia, bilateral: Secondary | ICD-10-CM | POA: Diagnosis not present

## 2023-04-30 ENCOUNTER — Ambulatory Visit: Payer: Medicare Other | Admitting: Physical Therapy

## 2023-05-01 DIAGNOSIS — R0689 Other abnormalities of breathing: Secondary | ICD-10-CM | POA: Diagnosis not present

## 2023-05-01 DIAGNOSIS — S2232XD Fracture of one rib, left side, subsequent encounter for fracture with routine healing: Secondary | ICD-10-CM | POA: Diagnosis not present

## 2023-05-04 ENCOUNTER — Encounter: Payer: Self-pay | Admitting: Physical Therapy

## 2023-05-06 ENCOUNTER — Ambulatory Visit: Payer: Medicare Other | Admitting: Physical Therapy

## 2023-05-14 ENCOUNTER — Ambulatory Visit: Payer: Medicare Other | Attending: Urology | Admitting: Physical Therapy

## 2023-05-14 DIAGNOSIS — R278 Other lack of coordination: Secondary | ICD-10-CM

## 2023-05-14 DIAGNOSIS — R2689 Other abnormalities of gait and mobility: Secondary | ICD-10-CM | POA: Diagnosis not present

## 2023-05-14 DIAGNOSIS — M533 Sacrococcygeal disorders, not elsewhere classified: Secondary | ICD-10-CM

## 2023-05-14 NOTE — Therapy (Signed)
OUTPATIENT PHYSICAL THERAPY  TREATMENT    Patient Name: Brian Bean MRN: [id number] DOB:1941/03/25, 82 y.o., male Today's Date: 05/14/2023   PT End of Session - 05/14/23 1424     Visit Number 5    Number of Visits 10    Date for PT Re-Evaluation 06/11/23    PT Start Time 1420    PT Stop Time 1500    PT Time Calculation (min) 40 min    Activity Tolerance Patient tolerated treatment well;No increased pain    Behavior During Therapy WFL for tasks assessed/performed               Past Medical History:  Diagnosis Date   Anxiety    Arthritis    Cancer (HCC)    skin lesion scalp   CLL (chronic lymphocytic leukemia) (HCC) 01/06/2013   CLL (chronic lymphocytic leukemia) (HCC)    Depression    Head injury    fell from a horse   Hypertension    Lymphocytosis    PONV (postoperative nausea and vomiting)    extremely sick did not get sick after hip surgery in 2013   Prostate cancer [facility])    Past Surgical History:  Procedure Laterality Date   APPENDECTOMY     BRONCHIAL BIOPSY  04/16/2021   Procedure: BRONCHIAL BIOPSIES;  Surgeon: Josephine Igo, DO;  Location: [facility];  Service: Pulmonary;;   BRONCHIAL BRUSHINGS  04/16/2021   Procedure: BRONCHIAL BRUSHINGS;  Surgeon: Josephine Igo, DO;  Location: [facility];  Service: Pulmonary;;   BRONCHIAL NEEDLE ASPIRATION BIOPSY  04/16/2021   Procedure: BRONCHIAL NEEDLE ASPIRATION BIOPSIES;  Surgeon: Josephine Igo, DO;  Location: [facility];  Service: Pulmonary;;   BRONCHIAL WASHINGS  04/16/2021   Procedure: BRONCHIAL WASHINGS;  Surgeon: Josephine Igo, DO;  Location: [facility];  Service: Pulmonary;;   CATARACT EXTRACTION, BILATERAL     CERVICAL DISC SURGERY  00   COLONOSCOPY  2011   [facility].    FRACTURE SURGERY     rt ankle   HERNIA REPAIR     rt and  undistended testicle   PROSTATE BIOPSY     REVERSE SHOULDER ARTHROPLASTY Right 05/01/2020   Procedure: REVERSE SHOULDER ARTHROPLASTY;  Surgeon: Christena Flake,  MD;  Location: [facility] ORS;  Service: Orthopedics;  Laterality: Right;   rt hip  12   pinned   TOTAL HIP ARTHROPLASTY  05/03/2012   Procedure: TOTAL HIP ARTHROPLASTY;  Surgeon: [name], MD;  Location: [facility];  Service: Orthopedics;  Laterality: Right;   VIDEO BRONCHOSCOPY WITH ENDOBRONCHIAL NAVIGATION Right 04/16/2021   Procedure: VIDEO BRONCHOSCOPY WITH ENDOBRONCHIAL NAVIGATION;  Surgeon: Josephine Igo, DO;  Location: [facility];  Service: Pulmonary;  Laterality: Right;  ION w/ CIOS   VIDEO BRONCHOSCOPY WITH RADIAL ENDOBRONCHIAL ULTRASOUND  04/16/2021   Procedure: RADIAL ENDOBRONCHIAL ULTRASOUND;  Surgeon: Josephine Igo, DO;  Location: [facility];  Service: Pulmonary;;   Patient Active Problem List   Diagnosis Date Noted   Hypokalemia 03/26/2023   Fatigue 12/25/2022   Encounter for monitoring androgen deprivation therapy 09/25/2022   Encounter for antineoplastic chemotherapy 07/15/2022   Left arm weakness 01/28/2022   Pneumothorax 01/27/2022   Wheeze 01/27/2022   Hyperlipidemia, unspecified 01/27/2022   Acute respiratory failure with hypoxia (HCC) 01/27/2022   Multiple rib fractures 01/26/2022   Arthritis 01/26/2022   Hypertension 01/26/2022   Goals of care, counseling/discussion 10/08/2021   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) 03/08/2021   Lung nodules 03/08/2021  History of skin cancer 08/20/2017   Overweight (BMI 25.0-29.9) 08/20/2017   Prostate cancer (HCC) 06/24/2016   Lymphadenopathy, abdominal 06/24/2016   Thrombocytopenia (HCC) 06/26/2014   CLL (chronic lymphocytic leukemia) (HCC) 01/06/2013   Avascular necrosis of right hip 05/05/2012    PCP: Orson Aloe   REFERRING PROVIDER: Leandro Reasoner DIAG: urgency of urination   Rationale for Evaluation and Treatment Rehabilitation  THERAPY DIAG:  Sacrococcygeal disorders, not elsewhere classified  Other abnormalities of gait and mobility  Other lack of coordination  ONSET DATE:    SUBJECTIVE STATEMENT TODAY:  Pt is waking up once a night to urinate compared to 4-5 x/ night.  Pt is down to 1 pad a day compared to 3  Pt has returned to his aquatic classes 3 x last week.   Pt fractured his rib with L 2 weeks ago and it is getting better so he missed PT sessions 2 weeks     SUBJECTIVE STATEMENT ON EVAL 04/02/23 :  urinary frequency:  Pt is on a medication that helped with urgency. Currently frequency is an issue. Pt has to  urinate 4-5 x within the first 2-3 hours upon waking 6:45 -7am.  After the 12 pm, pt's frequency decreases to 2 x within 2 hours. Started after oral Tx of cancer in the lung.   Pt is taking direutic medications.  Nocturia:  1-2 x night.     Physical activities:  goes to the [facility] for water aerobics    PERTINENT HISTORY:   -fall with R rib Fx 2022 -Radiation 2020 for Prostate Cancer  -Currently taking medications to treat the prostate cancer in the lungs  -Hx of skin CA, chronic lymphocytic leukemia -Cervical disc surgery -Appendectomy -Inguinal hernia repair  -R THA with pin in R hip  -R ankle with pin -R shoulder arthroplasty   PAIN:  Are you having pain? No   PRECAUTIONS:        Active Cancer   WEIGHT BEARING RESTRICTIONS: No   FALLS:  Has patient fallen in last 6 months? 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,    PLOF: Independent  PATIENT GOALS:   Strengthen muscles to cut down the morning urinary frequency    OBJECTIVE:     Pelvic Floor Special Questions - 05/14/23 1629     External Perineal Exam through clothing: poor coordination of deep core ( exhaling through mouth, minimial excursion of diaphragm, minimal exhalation with ab overuse , thus minimal upward movement of pelvic floor)             [facility] Adult PT  Treatment/Exercise - 05/14/23 1630       Therapeutic Activites    Other Therapeutic Activities explained withholding kegel training until deep core coordinaiton is in place, allow time for more healing of ribs to help with optimal deep core coordination , reassessed goals      Neuro Re-ed    Neuro Re-ed Details  excessive cues for proper breathing technique through nose not mouth, optimal lengthening of inhalation ad exhaltion without ab overuse to promote upward positionof pelvic floor,                 HOME EXERCISE PROGRAM: See pt instruction section    ASSESSMENT:  CLINICAL IMPRESSION: Pt 's improvements  include:   Pt is waking up once a night to urinate compared to 4-5 x/ night.  Pt is down to 1 pad a day compared to 3  Pt has returned to his aquatic classes 3 x last week.   Pt also showed improved alignment of spine and pelvis with correction of leg length difference using a shoe lift in toe box and heel in R shoe. Pt's forward head and thoracic kyphopsis also improved manual Tx and therapeutic exercise.  FOTO scores improved, indicating improved function.  Pt is IND with HEP.    However, pt is not able to perform proper kegels yet. Pt showed poor coordination of deep core by exhaling through mouth, minimial excursion of diaphragm, minimal exhalation with ab overuse , thus minimal upward movement of pelvic floor.  Following excessive cues for proper technique, pt demo'd improved coordination.   Explained withholding kegel training until deep core coordinaiton is in place, allow time for more healing of ribs to help with optimal deep core coordination.  Pt continues to benefit form skilled PT.   OBJECTIVE IMPAIRMENTS decreased activity tolerance, decreased coordination, decreased endurance, decreased mobility, difficulty walking, decreased ROM, decreased strength, decreased safety awareness, hypomobility, increased muscle spasms, impaired flexibility, improper body  mechanics, postural dysfunction,  ACTIVITY LIMITATIONS  self-care,  sleep, home chores, work tasks    PARTICIPATION LIMITATIONS:  community, gym activities    PERSONAL FACTORS        are also affecting patient's functional outcome.    REHAB POTENTIAL: Good   CLINICAL DECISION MAKING: Evolving/moderate complexity   EVALUATION COMPLEXITY: Moderate    PATIENT EDUCATION:    Education details: Showed pt anatomy images. Explained muscles attachments/ connection, physiology of deep core system/ spinal- thoracic-pelvis-lower kinetic chain as they relate to pt's presentation, Sx, and past Hx. Explained what and how these areas of deficits need to be restored to balance and function    See Therapeutic activity / neuromuscular re-education section  Answered pt's questions.   Person educated: Patient Education method: Explanation, Demonstration, Tactile cues, Verbal cues, and Handouts Education comprehension: verbalized understanding, returned demonstration, verbal cues required, tactile cues required, and needs further education     PLAN: PT FREQUENCY: 1x/week   PT DURATION: 10 weeks   PLANNED INTERVENTIONS: Therapeutic exercises, Therapeutic activity, Neuromuscular re-education, Balance training, Gait training, Patient/Family education, Self Care, Joint mobilization, Spinal mobilization, Moist heat, Taping, and Manual therapy, dry needling.   PLAN FOR NEXT SESSION: See clinical impression for plan     GOALS: Goals reviewed with patient? Yes  SHORT TERM GOALS: Target date: 04/30/2023    1. Pt will demo IND with HEP                    Baseline: Not IND            Goal status:MET    LONG TERM GOALS: Target date: 06/11/2023    1.Pt will demo proper deep core coordination without chest breathing and optimal excursion of diaphragm/pelvic floor in order to promote spinal stability and pelvic floor function  Baseline: dyscoordination Goal status: MET   2.  Pt will demo > 5 pt  change on FOTO  to improve QOL and function   PFDI Urinary baseline - 13  --> 8   Lower score = better function   Urinary Problem baseline-  57   --> 64 Higher score = better function    Goal status:  MET   3.  Pt will demo proper  body mechanics in against gravity tasks and ADLs  work tasks, fitness  to minimize straining pelvic floor / back    Baseline: not IND, improper form that places strain on pelvic floor  Goal status: MET     4. Pt will demo increased gait speed > 1.3 m/s with reciprocal gait pattern, longer stride length  in order to ambulate safely in community and return to fitness routine  Baseline: 1.2 m/s, R trunk leanm decreased stance on R LE  Goal status: MET  ( 11/14:   1.34 m/s with shoe lifts in R )     5. Pt will demo levelled pelvic girdle and shoulder height in order to progress to deep core strengthening HEP and restore mobility at spine, pelvis, gait, posture minimize falls, and improve balance  Baseline:  R shoulder, R pelvic lowered  Goal status: MET  ( 11/14: with shoe lifts in R )   6. Pt will demo proper upward movement of pelvic floor without ab overuse and advance to 3 quick contractions of kegel in hooklying without cues for proper technique in order to further gain continence for pool classess Baseline:  ab overuse, kegel program withheld until technique improves, rib fracture heals more  Goal: NEW    Mariane Masters, PT 05/14/2023, 2:26 PM

## 2023-05-21 ENCOUNTER — Ambulatory Visit: Payer: Medicare Other | Admitting: Physical Therapy

## 2023-05-26 DIAGNOSIS — R3915 Urgency of urination: Secondary | ICD-10-CM | POA: Diagnosis not present

## 2023-05-28 ENCOUNTER — Ambulatory Visit: Payer: Medicare Other | Admitting: Physical Therapy

## 2023-06-16 ENCOUNTER — Telehealth: Payer: Self-pay

## 2023-06-16 NOTE — Telephone Encounter (Signed)
 Oral Oncology Patient Advocate Encounter   Received notification that the application for assistance for Erleada  through [facility] Patient Assistance Program has been approved for 2025.   [employer] phone number [phone].   Effective dates: 06/10/23 through 06/08/24  Medication will be filled at [facility].   Morene Potters, CPhT Oncology Pharmacy Patient Advocate  [facility]  [phone] (phone) [phone] (fax) 06/16/2023 1:34 PM

## 2023-06-23 ENCOUNTER — Inpatient Hospital Stay: Payer: Medicare Other | Attending: Oncology

## 2023-06-23 DIAGNOSIS — Z7982 Long term (current) use of aspirin: Secondary | ICD-10-CM | POA: Insufficient documentation

## 2023-06-23 DIAGNOSIS — Z87891 Personal history of nicotine dependence: Secondary | ICD-10-CM | POA: Insufficient documentation

## 2023-06-23 DIAGNOSIS — C7801 Secondary malignant neoplasm of right lung: Secondary | ICD-10-CM | POA: Insufficient documentation

## 2023-06-23 DIAGNOSIS — C61 Malignant neoplasm of prostate: Secondary | ICD-10-CM | POA: Diagnosis present

## 2023-06-23 DIAGNOSIS — C9110 Chronic lymphocytic leukemia of B-cell type not having achieved remission: Secondary | ICD-10-CM | POA: Insufficient documentation

## 2023-06-23 DIAGNOSIS — C7802 Secondary malignant neoplasm of left lung: Secondary | ICD-10-CM | POA: Insufficient documentation

## 2023-06-23 DIAGNOSIS — Z79899 Other long term (current) drug therapy: Secondary | ICD-10-CM | POA: Diagnosis not present

## 2023-06-23 LAB — CMP (CANCER CENTER ONLY)
ALT: 14 U/L (ref 0–44)
AST: 18 U/L (ref 15–41)
Albumin: 3.8 g/dL (ref 3.5–5.0)
Alkaline Phosphatase: 45 U/L (ref 38–126)
Anion gap: 9 (ref 5–15)
BUN: 20 mg/dL (ref 8–23)
CO2: 26 mmol/L (ref 22–32)
Calcium: 9 mg/dL (ref 8.9–10.3)
Chloride: 102 mmol/L (ref 98–111)
Creatinine: 0.75 mg/dL (ref 0.61–1.24)
GFR, Estimated: >60 mL/min (ref >60)
Glucose, Bld: 81 mg/dL (ref 70–99)
Potassium: 3.6 mmol/L (ref 3.5–5.1)
Sodium: 137 mmol/L (ref 135–145)
Total Bilirubin: 0.5 mg/dL (ref 0.0–1.2)
Total Protein: 6.2 g/dL — ABNORMAL LOW (ref 6.5–8.1)

## 2023-06-23 LAB — CBC WITH DIFFERENTIAL (CANCER CENTER ONLY)
Abs Immature Granulocytes: 0.05 10*3/uL (ref 0.00–0.07)
Basophils Absolute: 0.1 10*3/uL (ref 0.0–0.1)
Basophils Relative: 1 %
Eosinophils Absolute: 0.4 10*3/uL (ref 0.0–0.5)
Eosinophils Relative: 3 %
HCT: 40.6 % (ref 39.0–52.0)
Hemoglobin: 13.4 g/dL (ref 13.0–17.0)
Immature Granulocytes: 0 %
Lymphocytes Relative: 55 %
Lymphs Abs: 7.3 10*3/uL — ABNORMAL HIGH (ref 0.7–4.0)
MCH: 30.5 pg (ref 26.0–34.0)
MCHC: 33 g/dL (ref 30.0–36.0)
MCV: 92.5 fL (ref 80.0–100.0)
Monocytes Absolute: 0.7 10*3/uL (ref 0.1–1.0)
Monocytes Relative: 5 %
Neutro Abs: 4.8 10*3/uL (ref 1.7–7.7)
Neutrophils Relative %: 36 %
Platelet Count: 182 10*3/uL (ref 150–400)
RBC: 4.39 MIL/uL (ref 4.22–5.81)
RDW: 13.7 % (ref 11.5–15.5)
WBC Count: 13.3 10*3/uL — ABNORMAL HIGH (ref 4.0–10.5)
nRBC: 0 % (ref 0.0–0.2)

## 2023-06-24 LAB — PSA: Prostatic Specific Antigen: 0.01 ng/mL (ref 0.00–4.00)

## 2023-06-25 ENCOUNTER — Inpatient Hospital Stay: Payer: Medicare Other | Admitting: Oncology

## 2023-06-25 ENCOUNTER — Encounter: Payer: Self-pay | Admitting: Oncology

## 2023-06-25 VITALS — BP 129/60 | HR 66 | Temp 96.2°F | Resp 18 | Wt 210.8 lb

## 2023-06-25 DIAGNOSIS — C61 Malignant neoplasm of prostate: Secondary | ICD-10-CM | POA: Diagnosis not present

## 2023-06-25 DIAGNOSIS — Z5111 Encounter for antineoplastic chemotherapy: Secondary | ICD-10-CM

## 2023-06-25 DIAGNOSIS — Z79818 Long term (current) use of other agents affecting estrogen receptors and estrogen levels: Secondary | ICD-10-CM

## 2023-06-25 DIAGNOSIS — Z7982 Long term (current) use of aspirin: Secondary | ICD-10-CM | POA: Diagnosis not present

## 2023-06-25 DIAGNOSIS — Z87891 Personal history of nicotine dependence: Secondary | ICD-10-CM | POA: Diagnosis not present

## 2023-06-25 DIAGNOSIS — C9110 Chronic lymphocytic leukemia of B-cell type not having achieved remission: Secondary | ICD-10-CM

## 2023-06-25 DIAGNOSIS — R5383 Other fatigue: Secondary | ICD-10-CM

## 2023-06-25 DIAGNOSIS — C7802 Secondary malignant neoplasm of left lung: Secondary | ICD-10-CM | POA: Diagnosis not present

## 2023-06-25 DIAGNOSIS — C7801 Secondary malignant neoplasm of right lung: Secondary | ICD-10-CM | POA: Diagnosis not present

## 2023-06-25 DIAGNOSIS — Z79899 Other long term (current) drug therapy: Secondary | ICD-10-CM | POA: Diagnosis not present

## 2023-06-25 NOTE — Assessment & Plan Note (Addendum)
Stage IV castration sensitive prostate cancer with multiple radiotracer avid lung nodules on PSMA. Previous lung nodule biopsy results are consistent with malignant cells, non-small cell carcinoma, not enough tissue for further work-up for tissue origin. Nodules are avid on PSMA, likely metastatic prostate cancer lung involvement.  10/24/21 PMSA PET scan showed interval decrease of pulmonary nodules-treatment response.  Continue Adrogen deprivation therapy.- today, next due Lupron 45mg  Q6 months - April 2025 Labs are reviewed and discussed with patient. PSA 0.01 Continue Apalutamide 240mg  daily Repeat PSMA PET scan

## 2023-06-25 NOTE — Progress Notes (Signed)
Hematology/Oncology Progress note [phone] [phone]    CHIEF COMPLAINTS/REASON FOR VISIT:  Prostate cancer  ASSESSMENT & PLAN:   Cancer Staging  CLL (chronic lymphocytic leukemia) (HCC) Staging form: Chronic Lymphocytic Leukemia / Small Lymphocytic Lymphoma, AJCC 8th Edition - Clinical stage from 01/10/2021: Modified Rai Stage 0 (Modified Rai risk: Low, Lymphocytosis: Present, Adenopathy: Absent, Organomegaly: Absent, Anemia: Absent, Thrombocytopenia: Absent) - Signed by Artis Delay, MD on 01/10/2021  Prostate cancer [facility]) Staging form: Prostate, AJCC 8th Edition - Clinical: Stage IVB (ycTX, cNX, cM1) - Signed by Rickard Patience, MD on 10/08/2021   Prostate cancer (HCC) Stage IV castration sensitive prostate cancer with multiple radiotracer avid lung nodules on PSMA. Previous lung nodule biopsy results are consistent with malignant cells, non-small cell carcinoma, not enough tissue for further work-up for tissue origin. Nodules are avid on PSMA, likely metastatic prostate cancer lung involvement.  10/24/21 PMSA PET scan showed interval decrease of pulmonary nodules-treatment response.  Continue Adrogen deprivation therapy.- today, next due Lupron 45mg  Q6 months - April 2025 Labs are reviewed and discussed with patient. PSA 0.01 Continue Apalutamide 240mg  daily Repeat PSMA PET scan  CLL (chronic lymphocytic leukemia) (HCC) Diagnosed on 07/08/2012.  Currently on watchful waiting  Encounter for antineoplastic chemotherapy Treatment plan as listed above.  Encounter for monitoring androgen deprivation therapy Eligard 45mg  Q6 months. -  Next due in April 2025   Fatigue B12 is borderline normal, recommend B12 1000 mcg daily. Otc supply  Orders Placed This Encounter  Procedures   NM PET (PSMA) SKULL TO MID THIGH    Standing Status:   Future    Expected Date:   09/23/2023    Expiration Date:   06/24/2024    If indicated for the ordered procedure, I authorize the  administration of a radiopharmaceutical per Radiology protocol:   Yes    Preferred imaging location?:   [facility]   CBC with Differential (Cancer Center Only)    Standing Status:   Future    Expected Date:   09/23/2023    Expiration Date:   06/24/2024   PSA    Standing Status:   Future    Expected Date:   09/23/2023    Expiration Date:   06/24/2024   Vitamin B12    Standing Status:   Future    Expected Date:   09/23/2023    Expiration Date:   06/24/2024   CMP (Cancer Center only)    Standing Status:   Future    Expected Date:   09/23/2023    Expiration Date:   06/24/2024     Follow-up in 3 months All questions were answered. The patient knows to call the clinic with any problems questions or concerns.   Rickard Patience, MD, PhD [facility] Hematology Oncology 06/25/2023    HISTORY OF PRESENTING ILLNESS:   Brian Bean is a  83 y.o.  male with PMH listed below was seen in consultation at the request of  Emilio Aspen, *  for evaluation of prostate cancer.  Patient's oncology care was previously with Dr. Eli Hose.  Patient presents for second opinion and prefers to transfer care to our cancer center. I reviewed patient's previous oncology records.  Oncology History  CLL (chronic lymphocytic leukemia) (HCC)  07/08/2012 Pathology Results   Accession #: [id number]  peripheral blood comfirmed CLL   2015 Initial Diagnosis   #History of prostate cancer, initially diagnosed in June 2015, Gleason score of 6, patient has been on active surveillance between 2015-2019,  PSA gradually increasing up to 16.9.   06/24/2016 Pathology Results   CLL FISH showed 3.67% positive for deletion 13 q and 5% positive for p53 deletion   01/10/2021 Cancer Staging   Staging form: Chronic Lymphocytic Leukemia / Small Lymphocytic Lymphoma, AJCC 8th Edition - Clinical stage from 01/10/2021: Modified Rai Stage 0 (Modified Rai risk: Low, Lymphocytosis: Present, Adenopathy: Absent, Organomegaly: Absent,  Anemia: Absent, Thrombocytopenia: Absent) - Signed by Artis Delay, MD on 01/10/2021 Stage prefix: Initial diagnosis   07/02/2022 -  Chemotherapy   Started on apalutamide 240mg     Prostate cancer (HCC)  2015 Initial Diagnosis   #History of prostate cancer, initially diagnosed in June 2015, Gleason score of 6, patient has been on active surveillance between 2015-2019, PSA gradually increasing up to 16.9.   06/24/2016 Initial Diagnosis   Prostate cancer (HCC)   12/28/2020 Imaging   12/28/2020, PSMA at [facility] showed multiple bilateral radiotracer avid pulmonary nodules.  For example right upper lobe 1.2 x 1.4 cm and the left lower lobe 1.5 x 1.3 cm.  Prostate showed no focal radiotracer uptake to suggest local recurrence.  No PSMA avid pelvic, retroperitoneal, or mesenteric adenopathy.  Multiple pulmonary radiotracer avid nodules concerning for metastatic disease.  No osseous metastatic disease.  Severe three-vessel coronary artery calcifications   04/16/2021 Initial Biopsy   patient underwent biopsy via bronchoscopy. Lung R LL biopsy showed malignant cells consistent with non-small cell carcinoma. Lung RLL brushing showed malignant cells consistent with non-small cell carcinoma, Lung R UL brushing showed no malignant cells. Lung R UL biopsy showed malignant cells consistent with non-small cell carcinoma. Lung RUL lavage showed no malignant cells identified. Immunostains for TTF-1, Napsin A, p63 and CK5/6 were attempted but are noncontributory.  There is insufficient tumor tissue on the cellblock for additional studies.     05/2021 Tumor Marker   PSA 8.7   05/10/2021 Miscellaneous   05/10/2021, started on androgen deprivation therapy. Eligard 30 mg every 4 months   08/2021 Tumor Marker   PSA 0.2   10/08/2021 Cancer Staging   Staging form: Prostate, AJCC 8th Edition - Clinical: Stage IVB (ycTX, cNX, cM1) - Signed by Rickard Patience, MD on 10/08/2021 Stage prefix: Post-therapy   10/23/2021 Imaging   PSMA  PET scan showed Small bilateral pulmonary metastases show intense radiopharmaceutical uptake, which show slight decrease in size since prior outside PET-CT. No new or progressive metastatic disease.   11/08/2021 Imaging   CT chest wo contrast showed the scattered pulmonary nodules are mildly reduced in size compared to 04/16/2021. No new nodules identified.   02/07/2022 Procedure   Status post left pleural effusion thoracentesis -cytology not diagnostic of malignancy.   07/02/2022 -  Chemotherapy   07/02/2022 apalutamide 240mg .     07/15/2022 Tumor Marker   PSA <0.01   08/13/2022 Tumor Marker   PSA <0.01   03/24/2023 Tumor Marker   PSA <0.01    INTERVAL HISTORY Jacary Reffner is a 83 y.o. male who has above history reviewed by me today presents for follow up visit for prostate cancer. Patient reports feeling well.   07/02/2022 apalutamide 240mg  daily.  Overall he tolerates treatment with no significant side effects. Denies SOB, cough. + fatigue. lack of energy, and decreased motivation.  He would like to have repeat image done.    Review of Systems  Constitutional:  Positive for fatigue. Negative for appetite change, chills, fever and unexpected weight change.  HENT:   Negative for hearing loss and voice change.  Eyes:  Negative for eye problems and icterus.  Respiratory:  Negative for chest tightness, cough and shortness of breath.   Cardiovascular:  Negative for chest pain and leg swelling.  Gastrointestinal:  Negative for abdominal distention and abdominal pain.  Endocrine: Positive for hot flashes.  Genitourinary:  Negative for difficulty urinating, dysuria and frequency.   Musculoskeletal:  Negative for arthralgias.  Skin:  Negative for itching and rash.  Neurological:  Negative for light-headedness and numbness.  Hematological:  Negative for adenopathy. Does not bruise/bleed easily.  Psychiatric/Behavioral:  Negative for confusion.     MEDICAL HISTORY:  Past Medical  History:  Diagnosis Date   Anxiety    Arthritis    Cancer (HCC)    skin lesion scalp   CLL (chronic lymphocytic leukemia) (HCC) 01/06/2013   CLL (chronic lymphocytic leukemia) (HCC)    Depression    Head injury    fell from a horse   Hypertension    Lymphocytosis    PONV (postoperative nausea and vomiting)    extremely sick did not get sick after hip surgery in 2013   Prostate cancer [facility])     SURGICAL HISTORY: Past Surgical History:  Procedure Laterality Date   APPENDECTOMY     BRONCHIAL BIOPSY  04/16/2021   Procedure: BRONCHIAL BIOPSIES;  Surgeon: Josephine Igo, DO;  Location: [facility];  Service: Pulmonary;;   BRONCHIAL BRUSHINGS  04/16/2021   Procedure: BRONCHIAL BRUSHINGS;  Surgeon: Josephine Igo, DO;  Location: [facility];  Service: Pulmonary;;   BRONCHIAL NEEDLE ASPIRATION BIOPSY  04/16/2021   Procedure: BRONCHIAL NEEDLE ASPIRATION BIOPSIES;  Surgeon: Josephine Igo, DO;  Location: [facility];  Service: Pulmonary;;   BRONCHIAL WASHINGS  04/16/2021   Procedure: BRONCHIAL WASHINGS;  Surgeon: Josephine Igo, DO;  Location: [facility];  Service: Pulmonary;;   CATARACT EXTRACTION, BILATERAL     CERVICAL DISC SURGERY  00   COLONOSCOPY  2011   [facility].    FRACTURE SURGERY     rt ankle   HERNIA REPAIR     rt and  undistended testicle   PROSTATE BIOPSY     REVERSE SHOULDER ARTHROPLASTY Right 05/01/2020   Procedure: REVERSE SHOULDER ARTHROPLASTY;  Surgeon: Christena Flake, MD;  Location: [facility] ORS;  Service: Orthopedics;  Laterality: Right;   rt hip  12   pinned   TOTAL HIP ARTHROPLASTY  05/03/2012   Procedure: TOTAL HIP ARTHROPLASTY;  Surgeon: [name], MD;  Location: [facility] OR;  Service: Orthopedics;  Laterality: Right;   VIDEO BRONCHOSCOPY WITH ENDOBRONCHIAL NAVIGATION Right 04/16/2021   Procedure: VIDEO BRONCHOSCOPY WITH ENDOBRONCHIAL NAVIGATION;  Surgeon: Josephine Igo, DO;  Location: [facility];  Service: Pulmonary;  Laterality: Right;  ION w/ CIOS    VIDEO BRONCHOSCOPY WITH RADIAL ENDOBRONCHIAL ULTRASOUND  04/16/2021   Procedure: RADIAL ENDOBRONCHIAL ULTRASOUND;  Surgeon: Josephine Igo, DO;  Location: [facility];  Service: Pulmonary;;    SOCIAL HISTORY: Social History   Socioeconomic History   Marital status: Divorced    Spouse name: Not on file   Number of children: 1   Years of education: Not on file   Highest education level: Not on file  Occupational History    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[employer])  Tobacco Use   Smoking status: Former    Current packs/day: 0.00    Average packs/day: 0.5 packs/day for 10.0 years (5.0 ttl pk-yrs)    Types: Cigarettes    Start date: 04/29/1967    Quit date: 04/28/1977  Years since quitting: 46.1   Smokeless tobacco: Never   Tobacco comments:    10 oz alcohol wkly  Vaping Use   Vaping status: Never Used  Substance and Sexual Activity   Alcohol use: Yes    Alcohol/week: 21.0 standard drinks of alcohol    Types: 5 Glasses of wine, 16 Shots of liquor per week    Comment: 14 ounces a week   Drug use: No   Sexual activity: Yes  Other Topics Concern   Not on file  Social History Narrative   Retired.   Significant other: Randa Spike   One son, Luisa Hart   Social Drivers of Health   Financial Resource Strain: Not on [employer] Insecurity: Not on file  Transportation Needs: Not on file  Physical Activity: Not on file  Stress: Not on file  Social Connections: Not on file  Intimate Partner Violence: Not on file    FAMILY HISTORY: Family History  Problem Relation Age of Onset   Heart disease Mother    Cancer Father        unk type of cancer    ALLERGIES:  has no known allergies.  MEDICATIONS:  Current Outpatient Medications  Medication Sig Dispense Refill   amLODipine (NORVASC) 5 MG tablet Take 5 mg by mouth 2 (two) times daily.      apalutamide (ERLEADA) 60 MG tablet Take 240 mg by mouth daily.     aspirin EC 81 MG tablet Take 81 mg by mouth daily.  Swallow whole.     atorvastatin (LIPITOR) 20 MG tablet Take 20 mg by mouth daily.     carvedilol (COREG) 12.5 MG tablet Take 12.5 mg by mouth 2 (two) times daily with a meal.      Cholecalciferol 125 MCG (5000 UT) TABS Take by mouth.     fluorouracil (EFUDEX) 5 % cream Apply 1 application  topically 2 (two) times daily as needed (precancerous spots).     FLUoxetine (PROZAC) 40 MG capsule Take 80 mg by mouth daily.     furosemide (LASIX) 20 MG tablet Take 20 mg by mouth daily.     GEMTESA 75 MG TABS Take 1 tablet by mouth daily.     hydrochlorothiazide (HYDRODIURIL) 25 MG tablet Take 25 mg by mouth daily.      tamsulosin (FLOMAX) 0.4 MG CAPS capsule Take 0.4 mg by mouth daily.     ipratropium (ATROVENT) 0.06 % nasal spray Place 2 sprays into both nostrils as needed for rhinitis. (Patient not taking: Reported on 06/25/2023)     ketoconazole (NIZORAL) 2 % shampoo Apply 1 application topically daily as needed for irritation. (Patient not taking: Reported on 12/25/2022)     lisinopril (PRINIVIL,ZESTRIL) 10 MG tablet Take 10 mg by mouth daily. (Patient not taking: Reported on 03/26/2023)     mirabegron ER (MYRBETRIQ) 25 MG TB24 tablet Take 25 mg by mouth daily. (Patient not taking: Reported on 06/25/2023)     naproxen sodium (ALEVE) 220 MG tablet Take 220 mg by mouth daily as needed (pain). (Patient not taking: Reported on 05/26/2022)     OLANZapine (ZYPREXA) 2.5 MG tablet Take 2.5 mg by mouth at bedtime. (Patient not taking: Reported on 05/26/2022)     No current facility-administered medications for this visit.     PHYSICAL EXAMINATION: ECOG PERFORMANCE STATUS: 1 - Symptomatic but completely ambulatory Vitals:   06/25/23 1430  BP: 129/60  Pulse: 66  Resp: 18  Temp: (!) 96.2 F (35.7 C)  SpO2: 97%  Filed Weights   06/25/23 1430  Weight: 210 lb 12.8 oz (95.6 kg)     Physical Exam Constitutional:      General: He is not in acute distress. HENT:     Head: Normocephalic and  atraumatic.  Eyes:     General: No scleral icterus. Cardiovascular:     Rate and Rhythm: Normal rate and regular rhythm.     Heart sounds: Normal heart sounds.  Pulmonary:     Effort: Pulmonary effort is normal. No respiratory distress.     Breath sounds: No wheezing.  Abdominal:     General: Bowel sounds are normal. There is no distension.     Palpations: Abdomen is soft.  Musculoskeletal:        General: No deformity. Normal range of motion.     Cervical back: Normal range of motion and neck supple.  Skin:    General: Skin is warm and dry.     Findings: No erythema or rash.  Neurological:     Mental Status: He is alert and oriented to person, place, and time. Mental status is at baseline.     Cranial Nerves: No cranial nerve deficit.     Coordination: Coordination normal.  Psychiatric:        Mood and Affect: Mood normal.     LABORATORY DATA:  I have reviewed the data as listed Lab Results  Component Value Date   WBC 13.3 (H) 06/23/2023   HGB 13.4 06/23/2023   HCT 40.6 06/23/2023   MCV 92.5 06/23/2023   PLT 182 06/23/2023   Recent Labs    12/18/22 1311 03/24/23 1346 06/23/23 1444  NA 139 135 137  K 3.7 3.2* 3.6  CL 100 101 102  CO2 28 25 26   GLUCOSE 123* 107* 81  BUN 21 22 20   CREATININE 0.69 0.88 0.75  CALCIUM 9.4 8.8* 9.0  GFRNONAA >60 >60 >60  PROT 6.9 6.9 6.2*  ALBUMIN 4.2 3.7 3.8  AST 17 18 18   ALT 17 16 14   ALKPHOS 53 46 45  BILITOT 0.6 0.6 0.5

## 2023-06-25 NOTE — Assessment & Plan Note (Addendum)
Eligard 45mg  Q6 months. -  Next due in April 2025

## 2023-06-25 NOTE — Assessment & Plan Note (Signed)
Treatment plan as listed above. 

## 2023-06-25 NOTE — Assessment & Plan Note (Signed)
Diagnosed on 07/08/2012.  Currently on watchful waiting

## 2023-06-25 NOTE — Assessment & Plan Note (Signed)
B12 is borderline normal, recommend B12 1000 mcg daily. [facility] supply

## 2023-06-29 ENCOUNTER — Other Ambulatory Visit: Payer: Self-pay | Admitting: Pharmacist

## 2023-06-29 DIAGNOSIS — C61 Malignant neoplasm of prostate: Secondary | ICD-10-CM

## 2023-06-29 MED ORDER — APALUTAMIDE 60 MG PO TABS
240.0000 mg | ORAL_TABLET | Freq: Every day | ORAL | 2 refills | Status: DC
Start: 2023-06-29 — End: 2023-09-03

## 2023-07-11 IMAGING — CT NM PET TUM IMG SKULL BASE T - THIGH
6 series · 25 of 25 positions shown · non-contrast
Comparison: Outside PET-CT from [facility] dated 12/28/2020

CLINICAL DATA: Metastatic prostate carcinoma.

EXAM:
NUCLEAR MEDICINE PET SKULL BASE TO THIGH
TECHNIQUE: 9.0 mCi F18 Piflufolastat (Pylarify) was injected intravenously.
Full-ring PET imaging was performed from the skull base to thigh
after the radiotracer. CT data was obtained and used for attenuation
correction and anatomic localization.

[Series 2: ct slices · axial · 3.8mm · 1.37mm/px · z∈[-995,-21]mm · 6 of 296 slices shown]
[im 1/296  brain]
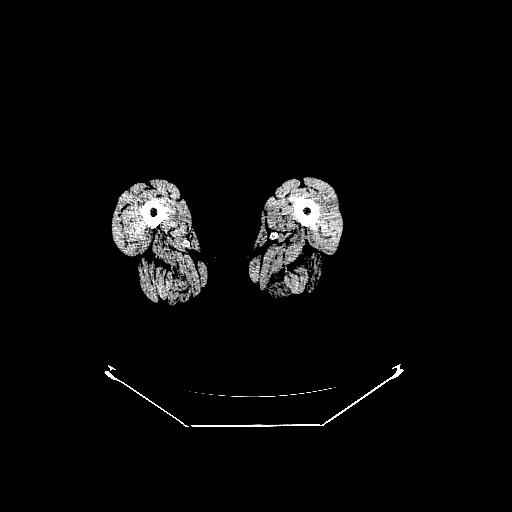
[im 60/296]
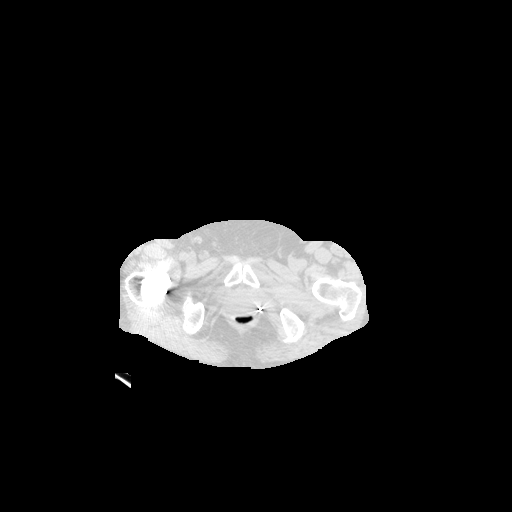
[im 119/296]
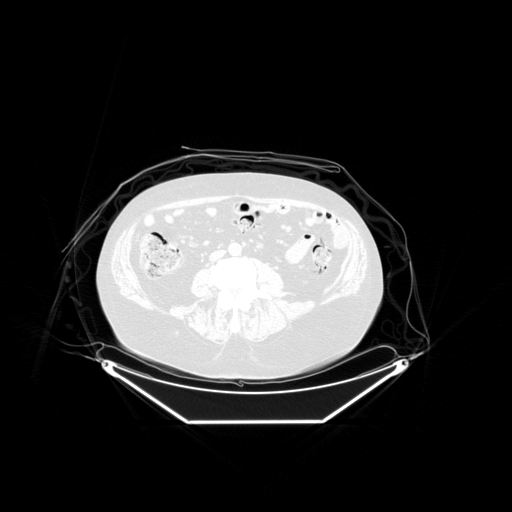
[im 178/296  brain]
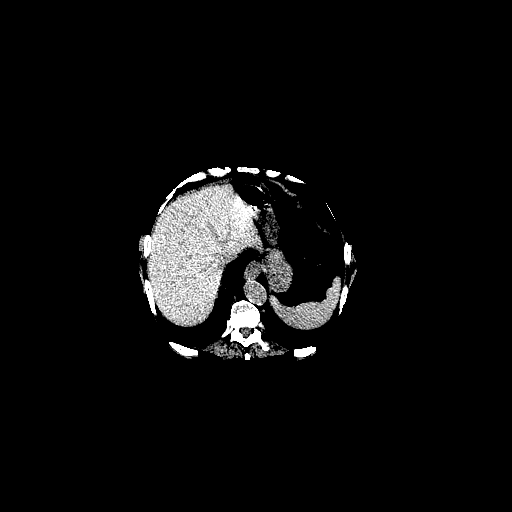
[im 237/296]
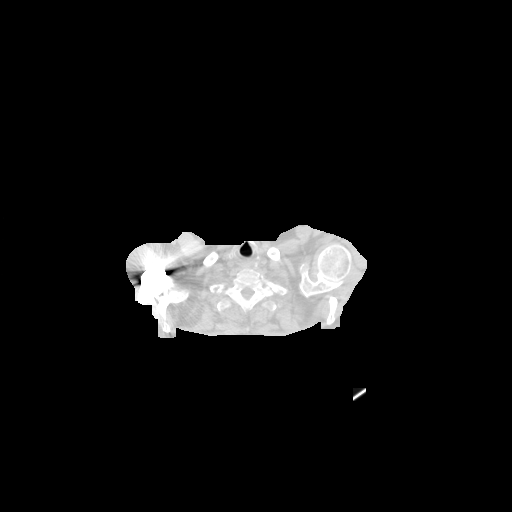
[im 296/296]
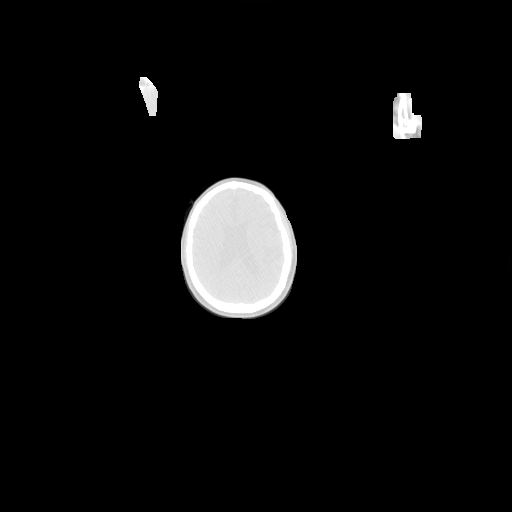

[Series 3: pet ac 3d body · axial · 3.3mm · 5.47mm/px · z∈[-996,-21]mm · 5 of 299 slices shown]
[im 1/299]
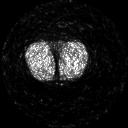
[im 75/299]
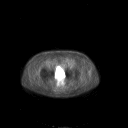
[im 150/299]
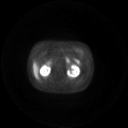
[im 224/299]
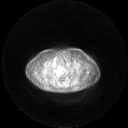
[im 299/299]
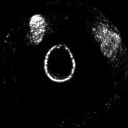

[Series 4: pet nac 3d body · axial · 3.3mm · 5.47mm/px · z∈[-996,-21]mm · 5 of 299 slices shown]
[im 1/299]
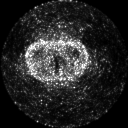
[im 75/299]
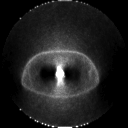
[im 150/299]
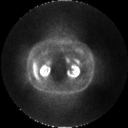
[im 224/299]
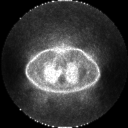
[im 299/299]
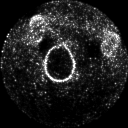

[Series 303: pet axial · axial · 3.3mm · 5.47mm/px · z∈[-996,-21]mm · 5 of 299 slices shown]
[im 1/299]
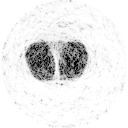
[im 75/299]
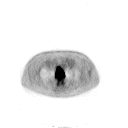
[im 150/299]
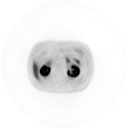
[im 224/299]
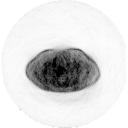
[im 299/299]
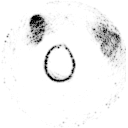

[Series 304: pet sagittal · sagittal · 5.5mm · 7.82mm/px · 2 of 107 slices shown]
[im 1/107]
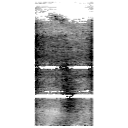
[im 107/107]
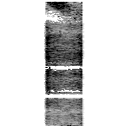

[Series 305: pet coronal · coronal · 5.5mm · 7.82mm/px · 2 of 90 slices shown]
[im 1/90]
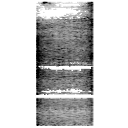
[im 90/90]
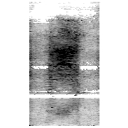

[25 of 25 positions shown; findings below may reference images not displayed]

FINDINGS: NECK

No radiotracer activity in neck lymph nodes.

Incidental CT finding: None

CHEST

No radiotracer accumulation within mediastinal or hilar lymph nodes.

Multiple small pulmonary nodules are again seen bilaterally which
show radiopharmaceutical uptake. Several of these nodules show mild
decrease in size since previous study. Index nodule in the posterior
left lower lobe on image 119/2 measures 12 mm, compared to 15 mm
previously. This has SUV max of 12.6 on today's exam. Another index
nodule in the posterior right upper lobe abutting the fissure
measures 11 mm on image 92/2, compared to 14 mm previously. This has
SUV max of 12.4 on today's exam. No new or enlarging pulmonary
nodules are seen on CT images.

Incidental CT finding: Aortic and coronary atherosclerotic
calcification incidentally noted.

ABDOMEN/PELVIS

Prostate: No focal activity within the prostate.

Lymph nodes: No abnormal radiotracer accumulation within pelvic or
abdominal nodes.

Liver: No evidence of liver metastasis

Incidental CT finding: Colonic diverticulosis, without evidence of
diverticulitis. Small left inguinal hernia, which contains only fat.
Aortic atherosclerotic calcification incidentally noted.

SKELETON

No focal  activity to suggest skeletal metastasis.
IMPRESSION: Small bilateral pulmonary metastases show intense
radiopharmaceutical uptake, which show slight decrease in size since
prior outside PET-CT.

No new or progressive metastatic disease.

## 2023-07-27 ENCOUNTER — Encounter: Payer: Self-pay | Admitting: Physical Therapy

## 2023-07-27 DIAGNOSIS — R278 Other lack of coordination: Secondary | ICD-10-CM

## 2023-07-27 DIAGNOSIS — M533 Sacrococcygeal disorders, not elsewhere classified: Secondary | ICD-10-CM

## 2023-07-27 DIAGNOSIS — R2689 Other abnormalities of gait and mobility: Secondary | ICD-10-CM

## 2023-07-27 NOTE — Therapy (Signed)
OUTPATIENT PHYSICAL THERAPY  Discharge Summary after 5 visits   Patient Name: Mccabe Gloria MRN: [id number] DOB:05-09-1941, 83 y.o., male Today's Date: 07/27/2023    Past Medical History:  Diagnosis Date   Anxiety    Arthritis    Cancer (HCC)    skin lesion scalp   CLL (chronic lymphocytic leukemia) (HCC) 01/06/2013   CLL (chronic lymphocytic leukemia) (HCC)    Depression    Head injury    fell from a horse   Hypertension    Lymphocytosis    PONV (postoperative nausea and vomiting)    extremely sick did not get sick after hip surgery in 2013   Prostate cancer [facility])    Past Surgical History:  Procedure Laterality Date   APPENDECTOMY     BRONCHIAL BIOPSY  04/16/2021   Procedure: BRONCHIAL BIOPSIES;  Surgeon: Josephine Igo, DO;  [facility]: [facility];  Service: Pulmonary;;   BRONCHIAL BRUSHINGS  04/16/2021   Procedure: BRONCHIAL BRUSHINGS;  Surgeon: Josephine Igo, DO;  [facility];  Service: Pulmonary;;   BRONCHIAL NEEDLE ASPIRATION BIOPSY  04/16/2021   Procedure: BRONCHIAL NEEDLE ASPIRATION BIOPSIES;  Surgeon: Josephine Igo, DO;  [facility];  Service: Pulmonary;;   BRONCHIAL WASHINGS  04/16/2021   Procedure: BRONCHIAL WASHINGS;  Surgeon: Josephine Igo, DO;  [facility];  Service: Pulmonary;;   CATARACT EXTRACTION, BILATERAL     CERVICAL DISC SURGERY  00   COLONOSCOPY  2011   [facility].    FRACTURE SURGERY     rt ankle   HERNIA REPAIR     rt and  undistended testicle   PROSTATE BIOPSY     REVERSE SHOULDER ARTHROPLASTY Right 05/01/2020   Procedure: REVERSE SHOULDER ARTHROPLASTY;  Surgeon: Christena Flake, MD;  Location: [facility] ORS;  Service: Orthopedics;  Laterality: Right;   rt hip  12   pinned   TOTAL HIP ARTHROPLASTY  05/03/2012   Procedure: TOTAL HIP ARTHROPLASTY;  Surgeon: [name], MD;  [facility];  Service: Orthopedics;  Laterality: Right;   VIDEO BRONCHOSCOPY WITH ENDOBRONCHIAL NAVIGATION Right 04/16/2021    Procedure: VIDEO BRONCHOSCOPY WITH ENDOBRONCHIAL NAVIGATION;  Surgeon: Josephine Igo, DO;  [facility];  Service: Pulmonary;  Laterality: Right;  ION w/ CIOS   VIDEO BRONCHOSCOPY WITH RADIAL ENDOBRONCHIAL ULTRASOUND  04/16/2021   Procedure: RADIAL ENDOBRONCHIAL ULTRASOUND;  Surgeon: Josephine Igo, DO;  [facility];  Service: Pulmonary;;   Patient Active Problem List   Diagnosis Date Noted   Hypokalemia 03/26/2023   Fatigue 12/25/2022   Encounter for monitoring androgen deprivation therapy 09/25/2022   Encounter for antineoplastic chemotherapy 07/15/2022   Left arm weakness 01/28/2022   Pneumothorax 01/27/2022   Wheeze 01/27/2022   Hyperlipidemia, unspecified 01/27/2022   Acute respiratory failure with hypoxia (HCC) 01/27/2022   Multiple rib fractures 01/26/2022   Arthritis 01/26/2022   Hypertension 01/26/2022   Goals of care, counseling/discussion 10/08/2021   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) 03/08/2021   Lung nodules 03/08/2021   History of skin cancer 08/20/2017   Overweight (BMI 25.0-29.9) 08/20/2017   Prostate cancer (HCC) 06/24/2016   Lymphadenopathy, abdominal 06/24/2016   Thrombocytopenia (HCC) 06/26/2014   CLL (chronic lymphocytic leukemia) (HCC) 01/06/2013   Avascular necrosis of right hip 05/05/2012    PCP: Orson Aloe   REFERRING PROVIDER: Leandro Reasoner DIAG: urgency of urination   Rationale for Evaluation and Treatment Rehabilitation  THERAPY DIAG:  Sacrococcygeal disorders, not elsewhere classified  Other abnormalities of  gait and mobility  Other lack of coordination  ONSET DATE:   See D/C summary below   SUBJECTIVE STATEMENT ON EVAL 04/02/23 :  urinary frequency:  Pt is on a medication that helped with urgency. Currently frequency is an issue. Pt has to  urinate 4-5 x within the first 2-3 hours upon waking 6:45 -7am.  After the 12 pm, pt's frequency decreases to 2 x within 2 hours. Started after  oral Tx of cancer in the lung.   Pt is taking direutic medications.  Nocturia:  1-2 x night.     Physical activities:  goes to the [facility] for water aerobics    PERTINENT HISTORY:   -fall with R rib Fx 2022 -Radiation 2020 for Prostate Cancer  -Currently taking medications to treat the prostate cancer in the lungs  -Hx of skin CA, chronic lymphocytic leukemia -Cervical disc surgery -Appendectomy -Inguinal hernia repair  -R THA with pin in R hip  -R ankle with pin -R shoulder arthroplasty   PAIN:  Are you having pain? No   PRECAUTIONS:        Active Cancer   WEIGHT BEARING RESTRICTIONS: No   FALLS:  Has patient fallen in last 6 months? 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,    PLOF: Independent  PATIENT GOALS:   Strengthen muscles to cut down the morning urinary frequency   CLINICAL IMPRESSION:  Pt 's improvements include:   Pt is waking up once a night to urinate compared to 4-5 x/ night.  Pt is down to 1 pad a day compared to 3  Pt has returned to his aquatic classes 3 x last week.   Pt also showed improved alignment of spine and pelvis with correction of leg length difference using a shoe lift in toe box and heel in R shoe. Pt's forward head and thoracic kyphopsis also improved manual Tx and therapeutic exercise.  FOTO scores improved, indicating improved function.  Pt is IND with HEP.    Therapist followed up with pt 2.5 months  ( 07/27/23) via phone after last visit in Dec and pt reported he continues to use 1 pad a day and has remained compliant with all HEP. Pt also continues to attend his aquatic classes.  Pt is ready for d/c .     OBJECTIVE IMPAIRMENTS decreased activity tolerance, decreased coordination, decreased endurance, decreased mobility, difficulty walking,  decreased ROM, decreased strength, decreased safety awareness, hypomobility, increased muscle spasms, impaired flexibility, improper body mechanics, postural dysfunction,  ACTIVITY LIMITATIONS  self-care,  sleep, home chores, work tasks    PARTICIPATION LIMITATIONS:  community, gym activities    PERSONAL FACTORS        are also affecting patient's functional outcome.    REHAB POTENTIAL: Good   CLINICAL DECISION MAKING: Evolving/moderate complexity   EVALUATION COMPLEXITY: Moderate    PATIENT EDUCATION:    Education details: Showed pt anatomy images. Explained muscles attachments/ connection, physiology of deep core system/ spinal- thoracic-pelvis-lower kinetic chain as they relate to pt's presentation, Sx, and past Hx. Explained what and how these areas of deficits need to be restored to balance and function    See Therapeutic activity / neuromuscular re-education section  Answered pt's questions.   Person educated: Patient Education method: Explanation, Demonstration, Tactile cues, Verbal  cues, and Handouts Education comprehension: verbalized understanding, returned demonstration, verbal cues required, tactile cues required, and needs further education     PLAN: PT FREQUENCY: 1x/week   PT DURATION: 10 weeks   PLANNED INTERVENTIONS: Therapeutic exercises, Therapeutic activity, Neuromuscular re-education, Balance training, Gait training, Patient/Family education, Self Care, Joint mobilization, Spinal mobilization, Moist heat, Taping, and Manual therapy, dry needling.   PLAN FOR NEXT SESSION: See clinical impression for plan     GOALS: Goals reviewed with patient? Yes  SHORT TERM GOALS: Target date: 04/30/2023    1. Pt will demo IND with HEP                    Baseline: Not IND            Goal status:MET    LONG TERM GOALS: Target date: 06/11/2023    1.Pt will demo proper deep core coordination without chest breathing and optimal excursion of diaphragm/pelvic floor  in order to promote spinal stability and pelvic floor function  Baseline: dyscoordination Goal status: MET   2.  Pt will demo > 5 pt change on FOTO  to improve QOL and function   PFDI Urinary baseline - 13  --> 8   Lower score = better function   Urinary Problem baseline-  57   --> 64 Higher score = better function    Goal status:  MET   3.  Pt will demo proper body mechanics in against gravity tasks and ADLs  work tasks, fitness  to minimize straining pelvic floor / back    Baseline: not IND, improper form that places strain on pelvic floor  Goal status: MET     4. Pt will demo increased gait speed > 1.3 m/s with reciprocal gait pattern, longer stride length  in order to ambulate safely in community and return to fitness routine  Baseline: 1.2 m/s, R trunk leanm decreased stance on R LE  Goal status: MET  ( 11/14:   1.34 m/s with shoe lifts in R )     5. Pt will demo levelled pelvic girdle and shoulder height in order to progress to deep core strengthening HEP and restore mobility at spine, pelvis, gait, posture minimize falls, and improve balance  Baseline:  R shoulder, R pelvic lowered  Goal status: MET  ( 11/14: with shoe lifts in R )   6. Pt will demo proper upward movement of pelvic floor without ab overuse and advance to 3 quick contractions of kegel in hooklying without cues for proper technique in order to further gain continence for pool classess Baseline:  ab overuse, kegel program withheld until technique improves, rib fracture heals more  Goal:  Not able to assess    Mariane Masters, PT 07/27/2023, 11:48 AM

## 2023-07-28 ENCOUNTER — Encounter: Payer: Self-pay | Admitting: Oncology

## 2023-07-31 ENCOUNTER — Ambulatory Visit: Payer: Medicare Other | Admitting: Physical Therapy

## 2023-08-11 ENCOUNTER — Ambulatory Visit (INDEPENDENT_AMBULATORY_CARE_PROVIDER_SITE_OTHER): Payer: Self-pay | Admitting: Sports Medicine

## 2023-08-11 ENCOUNTER — Encounter: Payer: Self-pay | Admitting: Oncology

## 2023-08-11 ENCOUNTER — Encounter: Payer: Self-pay | Admitting: Sports Medicine

## 2023-08-11 VITALS — BP 100/60 | HR 64 | Temp 96.9°F | Resp 16 | Ht 72.0 in | Wt 213.6 lb

## 2023-08-11 DIAGNOSIS — C9110 Chronic lymphocytic leukemia of B-cell type not having achieved remission: Secondary | ICD-10-CM

## 2023-08-11 DIAGNOSIS — C7800 Secondary malignant neoplasm of unspecified lung: Secondary | ICD-10-CM

## 2023-08-11 DIAGNOSIS — Z131 Encounter for screening for diabetes mellitus: Secondary | ICD-10-CM | POA: Diagnosis not present

## 2023-08-11 DIAGNOSIS — C61 Malignant neoplasm of prostate: Secondary | ICD-10-CM | POA: Diagnosis not present

## 2023-08-11 DIAGNOSIS — F331 Major depressive disorder, recurrent, moderate: Secondary | ICD-10-CM

## 2023-08-11 DIAGNOSIS — I10 Essential (primary) hypertension: Secondary | ICD-10-CM | POA: Diagnosis not present

## 2023-08-11 DIAGNOSIS — F32A Depression, unspecified: Secondary | ICD-10-CM

## 2023-08-11 DIAGNOSIS — R918 Other nonspecific abnormal finding of lung field: Secondary | ICD-10-CM

## 2023-08-11 DIAGNOSIS — R609 Edema, unspecified: Secondary | ICD-10-CM

## 2023-08-11 DIAGNOSIS — R5383 Other fatigue: Secondary | ICD-10-CM

## 2023-08-11 NOTE — Progress Notes (Signed)
 Careteam: Patient Care Team: Venita Sheffield, MD as PCP - General (Internal Medicine) Gean Birchwood, MD as Attending Physician (Orthopedic Surgery) Cherlyn Cushing, RN as Oncology Nurse Navigator Rickard Patience, MD as Consulting Physician (Oncology)  PLACE OF SERVICE:  [facility]  Advanced Directive information Does Patient Have a Medical Advance Directive?: Yes, Type of Advance Directive: Healthcare Power of [place];Living will;Out of facility DNR (pink MOST or yellow form), Does patient want to make changes to medical advance directive?: No - Patient declined  No Known Allergies  Chief Complaint  Patient presents with   Establish Care    New patient.      Discussed the use of AI scribe software for clinical note transcription with the patient, who gave verbal consent to proceed.  History of Present Illness   Brian Bean is an 83 year old male with prostate cancer and hypertension who presents for a new patient visit    He has a history of prostate cancer with metastasis to the lungs. He is currently receiving Lupron and is on chemotherapy. He experiences significant fatigue, loss of ambition, and a preference for naps. There is a weight loss of over 20 pounds in the last 18 months, with a current weight of approximately 207 pounds, down from a previous high of 225 pounds. He has a decreased appetite, eating two and a half meals per day. No blood in urine or stool, but he experiences night sweats and occasional fevers. He has shortness of breath with minimal exertion but can complete 45-minute aquatic exercise sessions three times a week.  He has a history of hypertension managed with multiple medications including amlodipine, Coreg, Lasix, hydrochlorothiazide, and lisinopril. He reports swelling in his legs and wears support stockings. He experiences dizziness when standing quickly and has had one fall in the past six months, resulting in a broken rib. He lives with a partner and  manages his daily activities independently.  He has been on Prozac for 40 years for mood management. He reports a lack of motivation and daily fatigue, with feelings of being down or hopeless twice a week. No issues with sleep or concentration. Denies suicidal ideations.  He has a history of a left shoulder replacement and experiences soreness when sleeping on that side. He has had a hip replacement and pin in the ankle on the right side, with noted swelling in the right leg.         Review of Systems:  Review of Systems  Constitutional:  Positive for malaise/fatigue. Negative for chills and fever.  HENT:  Negative for congestion and sore throat.   Respiratory:  Negative for cough, sputum production and shortness of breath.   Cardiovascular:  Positive for leg swelling. Negative for chest pain and palpitations.  Gastrointestinal:  Negative for abdominal pain, heartburn and nausea.  Genitourinary:  Negative for dysuria, frequency and hematuria.  Musculoskeletal:  Positive for falls. Negative for myalgias.  Neurological:  Negative for dizziness.  Psychiatric/Behavioral:  Positive for depression. Negative for suicidal ideas.    Negative unless indicated in HPI.   Past Medical History:  Diagnosis Date   Anxiety    Arthritis    Cancer (HCC)    skin lesion scalp   CLL (chronic lymphocytic leukemia) (HCC) 01/06/2013   CLL (chronic lymphocytic leukemia) (HCC)    Depression    Head injury    fell from a horse   Hypertension    Lymphocytosis    PONV (postoperative nausea and vomiting)  extremely sick did not get sick after hip surgery in 2013   Prostate cancer [facility])    Past Surgical History:  Procedure Laterality Date   APPENDECTOMY     BRONCHIAL BIOPSY  04/16/2021   Procedure: BRONCHIAL BIOPSIES;  Surgeon: Josephine Igo, DO;  [facility];  Service: Pulmonary;;   BRONCHIAL BRUSHINGS  04/16/2021   Procedure: BRONCHIAL BRUSHINGS;  Surgeon: Josephine Igo, DO;   [facility];  Service: Pulmonary;;   BRONCHIAL NEEDLE ASPIRATION BIOPSY  04/16/2021   Procedure: BRONCHIAL NEEDLE ASPIRATION BIOPSIES;  Surgeon: Josephine Igo, DO;  [facility];  Service: Pulmonary;;   BRONCHIAL WASHINGS  04/16/2021   Procedure: BRONCHIAL WASHINGS;  Surgeon: Josephine Igo, DO;  [facility];  Service: Pulmonary;;   CATARACT EXTRACTION, BILATERAL     CERVICAL DISC SURGERY  00   COLONOSCOPY  2011   [facility].    FRACTURE SURGERY     rt ankle   HERNIA REPAIR     rt and  undistended testicle   PROSTATE BIOPSY     REVERSE SHOULDER ARTHROPLASTY Right 05/01/2020   Procedure: REVERSE SHOULDER ARTHROPLASTY;  Surgeon: Christena Flake, MD;  Location: [facility] OR[facility];  Service: Orthopedics;  Laterality: Right;   rt hip  12   pinned   TOTAL HIP ARTHROPLASTY  05/03/2012   Procedure: TOTAL HIP ARTHROPLASTY;  Surgeon: [name], MD;  [facility];  Service: Orthopedics;  Laterality: Right;   VIDEO BRONCHOSCOPY WITH ENDOBRONCHIAL NAVIGATION Right 04/16/2021   Procedure: VIDEO BRONCHOSCOPY WITH ENDOBRONCHIAL NAVIGATION;  Surgeon: Josephine Igo, DO;  [facility];  Service: Pulmonary;  Laterality: Right;  ION w/ CIOS   VIDEO BRONCHOSCOPY WITH RADIAL ENDOBRONCHIAL ULTRASOUND  04/16/2021   Procedure: RADIAL ENDOBRONCHIAL ULTRASOUND;  Surgeon: Josephine Igo, DO;  [facility];  Service: Pulmonary;;   Social History:   reports that he quit smoking about 46 years ago. His smoking use included cigarettes. He started smoking about 56 years ago. He has a 5 pack-year smoking history. He has never used smokeless tobacco. He reports current alcohol use of about 21.0 standard drinks of alcohol per week. He reports that he does not use drugs.  Family History  Problem Relation Age of Onset   Heart disease Mother    Cancer Father        unk type of cancer    Medications: Patient's Medications  New Prescriptions   No medications on file   Previous Medications   ACETAMINOPHEN (TYLENOL) 500 MG TABLET    Take 500 mg by mouth as needed.   ALENDRONATE (FOSAMAX) 70 MG TABLET    Take 70 mg by mouth once a week. Take with a full glass of water on an empty stomach.   APALUTAMIDE (ERLEADA) 60 MG TABLET    Take 4 tablets (240 mg total) by mouth daily.   ASCORBIC ACID (VITAMIN C) 1000 MG TABLET    Take 1,000 mg by mouth daily.   ASPIRIN EC 81 MG TABLET    Take 81 mg by mouth daily. Swallow whole.   ATORVASTATIN (LIPITOR) 20 MG TABLET    Take 20 mg by mouth daily.   CARBOXYMETHYLCELLULOSE (REFRESH PLUS) 0.5 % SOLN    Place 1 drop into both eyes as needed.   CARVEDILOL (COREG) 12.5 MG TABLET    Take 12.5 mg by mouth 2 (two) times daily with a meal.    CHOLECALCIFEROL 125 MCG (5000 UT) TABS    Take by  mouth.   FLUOROURACIL (EFUDEX) 5 % CREAM    Apply 1 application  topically as needed (precancerous spots).   FLUOXETINE (PROZAC) 40 MG CAPSULE    Take 80 mg by mouth daily.   FLUTICASONE (FLONASE) 50 MCG/ACT NASAL SPRAY    Place 1 spray into both nostrils as needed for allergies or rhinitis.   FUROSEMIDE (LASIX) 20 MG TABLET    Take 20 mg by mouth daily.   GEMTESA 75 MG TABS    Take 1 tablet by mouth daily.   IPRATROPIUM (ATROVENT) 0.06 % NASAL SPRAY    Place 2 sprays into both nostrils as needed for rhinitis.   KETOCONAZOLE (NIZORAL) 2 % SHAMPOO    Apply 1 application  topically daily as needed for irritation.   LEUPROLIDE, 6 MONTH, (ELIGARD) 45 MG INJECTION    Inject 45 mg into the skin every 6 (six) months.   LISINOPRIL (PRINIVIL,ZESTRIL) 10 MG TABLET    Take 10 mg by mouth daily.   TAMSULOSIN (FLOMAX) 0.4 MG CAPS CAPSULE    Take 0.4 mg by mouth daily.  Modified Medications   No medications on file  Discontinued Medications   AMLODIPINE (NORVASC) 5 MG TABLET    Take 5 mg by mouth 2 (two) times daily.    HYDROCHLOROTHIAZIDE (HYDRODIURIL) 25 MG TABLET    Take 25 mg by mouth daily.    HYDROCHLOROTHIAZIDE (MICROZIDE) 12.5 MG CAPSULE    Take  6.25 mg by mouth daily.   MIRABEGRON ER (MYRBETRIQ) 25 MG TB24 TABLET    Take 25 mg by mouth daily.   NAPROXEN SODIUM (ALEVE) 220 MG TABLET    Take 220 mg by mouth daily as needed (pain).   OLANZAPINE (ZYPREXA) 2.5 MG TABLET    Take 2.5 mg by mouth at bedtime.    Physical Exam: Vitals:   08/11/23 1013  BP: 100/60  Pulse: 64  Resp: 16  Temp: (!) 96.9 F (36.1 C)  SpO2: 94%  Weight: 213 lb 9.6 oz (96.9 kg)  Height: 6' (1.829 m)   Body mass index is 28.97 kg/m. BP Readings from Last 3 Encounters:  08/11/23 100/60  06/25/23 129/60  03/26/23 (!) 142/72   Wt Readings from Last 3 Encounters:  08/11/23 213 lb 9.6 oz (96.9 kg)  06/25/23 210 lb 12.8 oz (95.6 kg)  03/26/23 212 lb (96.2 kg)    Physical Exam  Labs reviewed: Basic Metabolic Panel: Recent Labs    12/18/22 1311 12/25/22 1506 03/24/23 1346 06/23/23 1444  NA 139  --  135 137  K 3.7  --  3.2* 3.6  CL 100  --  101 102  CO2 28  --  25 26  GLUCOSE 123*  --  107* 81  BUN 21  --  22 20  CREATININE 0.69  --  0.88 0.75  CALCIUM 9.4  --  8.8* 9.0  TSH  --  1.763  --   --    Liver Function Tests: Recent Labs    12/18/22 1311 03/24/23 1346 06/23/23 1444  AST 17 18 18   ALT 17 16 14   ALKPHOS 53 46 45  BILITOT 0.6 0.6 0.5  PROT 6.9 6.9 6.2*  ALBUMIN 4.2 3.7 3.8   No results for input(s): "LIPASE", "AMYLASE" in the last 8760 hours. No results for input(s): "AMMONIA" in the last 8760 hours. CBC: Recent Labs    12/18/22 1311 03/24/23 1346 06/23/23 1445  WBC 11.0* 12.8* 13.3*  NEUTROABS 4.8 6.1 4.8  HGB 14.1 13.2 13.4  HCT 41.4 38.5* 40.6  MCV 89.8 90.8 92.5  PLT 269 222 182   Lipid Panel: No results for input(s): "CHOL", "HDL", "LDLCALC", "TRIG", "CHOLHDL", "LDLDIRECT" in the last 8760 hours. TSH: Recent Labs    12/25/22 1506  TSH 1.763   A1C: No results found for: "HGBA1C"  Assessment and Plan    Metastatic Prostate Cancer . Reports fatigue, loss of energy, and decreased ambition. -Continue  current treatment regimen. -Follow-up with oncologist   Hypertension On multiple antihypertensives (amlodipine, Coreg, Lasix, hydrochlorothiazide, lisinopril) with low blood pressure readings (100/80). Reports fatigue and leg swelling. -Discontinue hydrochlorothiazide and amlodipine due to potential contribution to fatigue and leg swelling. -Continue Coreg, Lasix, and lisinopril. -Monitor blood pressure daily at a consistent time. avoid salty foods, exercise regularly  Depression On Prozac for 40 years. Reports daily lack of interest and feeling down or depressed twice a week. No suicidal ideation at present but has considered suicide in the past. -Continue Prozac.  cont with behaivoral therapy will follow up in 4 weeks   Leg Swelling More pronounced in the right leg. Wears support stockings. -Continue Lasix for management of leg swelling. -Advise to limit salt intake due to its potential to exacerbate swelling.  General Health Maintenance -Order thyroid function tests to assess for potential contribution to fatigue. -Order diabetes screening test. -Obtain previous medical records to assess for need of pneumonia vaccination. -Schedule follow-up visit in 4 weeks.      Other fatigue   - TSH  Screening for diabetes mellitus   - Hemoglobin A1C       Other orders - Ascorbic Acid (VITAMIN C) 1000 MG tablet; Take 1,000 mg by mouth daily. - carboxymethylcellulose (REFRESH PLUS) 0.5 % SOLN; Place 1 drop into both eyes as needed. - acetaminophen (TYLENOL) 500 MG tablet; Take 500 mg by mouth as needed. - fluticasone (FLONASE) 50 MCG/ACT nasal spray; Place 1 spray into both nostrils as needed for allergies or rhinitis. - leuprolide, 6 Month, (ELIGARD) 45 MG injection; Inject 45 mg into the skin every 6 (six) months. - alendronate (FOSAMAX) 70 MG tablet; Take 70 mg by mouth once a week. Take with a full glass of water on an empty stomach.     No follow-ups on file.:   60 min   Total time spent for obtaining history,  performing a medically appropriate examination and evaluation, reviewing the tests,ordering  tests,  documenting clinical information in the electronic or other health record,  ,care coordination (not separately reported)

## 2023-08-11 NOTE — Patient Instructions (Signed)
 Stop hydrochlorothiazide and amlodipine  Cont with lisinopril, coreg, lasix

## 2023-08-12 LAB — TSH: TSH: 1.64 m[IU]/L (ref 0.40–4.50)

## 2023-08-12 LAB — HEMOGLOBIN A1C
Hgb A1c MFr Bld: 5.6 %{Hb} (ref <5.7)
Mean Plasma Glucose: 114 mg/dL
eAG (mmol/L): 6.3 mmol/L

## 2023-08-13 ENCOUNTER — Encounter: Payer: Self-pay | Admitting: Sports Medicine

## 2023-08-24 DIAGNOSIS — J301 Allergic rhinitis due to pollen: Secondary | ICD-10-CM | POA: Diagnosis not present

## 2023-08-24 DIAGNOSIS — H6123 Impacted cerumen, bilateral: Secondary | ICD-10-CM | POA: Diagnosis not present

## 2023-08-24 DIAGNOSIS — R0981 Nasal congestion: Secondary | ICD-10-CM | POA: Diagnosis not present

## 2023-08-24 DIAGNOSIS — J309 Allergic rhinitis, unspecified: Secondary | ICD-10-CM | POA: Diagnosis not present

## 2023-08-25 DIAGNOSIS — R3915 Urgency of urination: Secondary | ICD-10-CM | POA: Diagnosis not present

## 2023-08-25 DIAGNOSIS — R350 Frequency of micturition: Secondary | ICD-10-CM | POA: Diagnosis not present

## 2023-08-29 DIAGNOSIS — N3001 Acute cystitis with hematuria: Secondary | ICD-10-CM | POA: Diagnosis not present

## 2023-08-29 DIAGNOSIS — R300 Dysuria: Secondary | ICD-10-CM | POA: Diagnosis not present

## 2023-09-03 ENCOUNTER — Other Ambulatory Visit: Payer: Self-pay | Admitting: Pharmacist

## 2023-09-03 DIAGNOSIS — C61 Malignant neoplasm of prostate: Secondary | ICD-10-CM

## 2023-09-04 ENCOUNTER — Encounter: Payer: Self-pay | Admitting: Oncology

## 2023-09-04 MED ORDER — APALUTAMIDE 60 MG PO TABS
240.0000 mg | ORAL_TABLET | Freq: Every day | ORAL | 2 refills | Status: DC
Start: 2023-09-04 — End: 2023-11-11

## 2023-09-07 ENCOUNTER — Other Ambulatory Visit: Payer: Self-pay | Admitting: Sports Medicine

## 2023-09-07 DIAGNOSIS — C9110 Chronic lymphocytic leukemia of B-cell type not having achieved remission: Secondary | ICD-10-CM

## 2023-09-07 MED ORDER — LISINOPRIL 10 MG PO TABS
10.0000 mg | ORAL_TABLET | Freq: Every day | ORAL | 1 refills | Status: DC
Start: 2023-09-07 — End: 2024-02-29

## 2023-09-07 MED ORDER — FLUOXETINE HCL 40 MG PO CAPS: 80.0000 mg | ORAL_CAPSULE | Freq: Every day | ORAL | 1 refills | Status: DC

## 2023-09-07 NOTE — Telephone Encounter (Signed)
 Copied from CRM [id number]. Topic: Clinical - Medication Refill >> Sep 07, 2023  7:33 AM Maree Krabbe H wrote: Most Recent Primary Care Visit:  Provider: Venita Sheffield  Department: [facility]  Visit Type: NEW PATIENT  Date: 08/11/2023  Medication: FLUoxetine (PROZAC) 40 MG capsule lisinopril (PRINIVIL,ZESTRIL) 10 MG tablet  Has the patient contacted their pharmacy? Yes (Agent: If no, request that the patient contact the pharmacy for the refill. If patient does not wish to contact the pharmacy document the reason why and proceed with request.) (Agent: If yes, when and what did the pharmacy advise?)  Is this the correct pharmacy for this prescription? Yes If no, delete pharmacy and type the correct one.  This is the patient's preferred pharmacy:  [facility] [address] [facility] - [place], [place] - [address] [place] [address] Phone: [phone] Fax: [phone]  [facility] [address] [place] - [address] [place] [address] Phone: [phone] Fax: [phone]   Has the prescription been filled recently? No  Is the patient out of the medication? Yes  Has the patient been seen for an appointment in the last year OR does the patient have an upcoming appointment? Yes  Can we respond through MyChart? Yes  Agent: Please be advised that Rx refills may take up to 3 business days. We ask that you follow-up with your pharmacy.

## 2023-09-08 ENCOUNTER — Encounter: Admitting: Sports Medicine

## 2023-09-09 ENCOUNTER — Ambulatory Visit
Admission: RE | Admit: 2023-09-09 | Discharge: 2023-09-09 | Disposition: A | Discharge status: Home / Self Care | Payer: Medicare Other | Source: Ambulatory Visit | Attending: Oncology | Admitting: Oncology

## 2023-09-09 DIAGNOSIS — C61 Malignant neoplasm of prostate: Secondary | ICD-10-CM | POA: Diagnosis present

## 2023-09-09 DIAGNOSIS — C7801 Secondary malignant neoplasm of right lung: Secondary | ICD-10-CM | POA: Insufficient documentation

## 2023-09-09 DIAGNOSIS — C7802 Secondary malignant neoplasm of left lung: Secondary | ICD-10-CM | POA: Insufficient documentation

## 2023-09-09 MED ORDER — FLOTUFOLASTAT F 18 GALLIUM 296-5846 MBQ/ML IV SOLN
8.0100 | Freq: Once | INTRAVENOUS | Status: AC
  Administered 2023-09-09: 8.01 via INTRAVENOUS
  Filled 2023-09-09: qty 9

## 2023-09-09 NOTE — Progress Notes (Signed)
 This encounter was created in error - please disregard.

## 2023-09-10 ENCOUNTER — Other Ambulatory Visit: Payer: Medicare Other

## 2023-09-15 ENCOUNTER — Ambulatory Visit: Admitting: Sports Medicine

## 2023-09-15 ENCOUNTER — Inpatient Hospital Stay: Payer: Medicare Other | Attending: Oncology

## 2023-09-15 DIAGNOSIS — Z79899 Other long term (current) drug therapy: Secondary | ICD-10-CM | POA: Diagnosis not present

## 2023-09-15 DIAGNOSIS — C7802 Secondary malignant neoplasm of left lung: Secondary | ICD-10-CM | POA: Diagnosis not present

## 2023-09-15 DIAGNOSIS — C9110 Chronic lymphocytic leukemia of B-cell type not having achieved remission: Secondary | ICD-10-CM | POA: Insufficient documentation

## 2023-09-15 DIAGNOSIS — C3431 Malignant neoplasm of lower lobe, right bronchus or lung: Secondary | ICD-10-CM | POA: Insufficient documentation

## 2023-09-15 DIAGNOSIS — C61 Malignant neoplasm of prostate: Secondary | ICD-10-CM | POA: Diagnosis present

## 2023-09-15 DIAGNOSIS — C7801 Secondary malignant neoplasm of right lung: Secondary | ICD-10-CM | POA: Diagnosis not present

## 2023-09-15 LAB — CMP (CANCER CENTER ONLY)
ALT: 15 U/L (ref 0–44)
AST: 17 U/L (ref 15–41)
Albumin: 4 g/dL (ref 3.5–5.0)
Alkaline Phosphatase: 44 U/L (ref 38–126)
Anion gap: 10 (ref 5–15)
BUN: 25 mg/dL — ABNORMAL HIGH (ref 8–23)
CO2: 28 mmol/L (ref 22–32)
Calcium: 8.8 mg/dL — ABNORMAL LOW (ref 8.9–10.3)
Chloride: 99 mmol/L (ref 98–111)
Creatinine: 0.99 mg/dL (ref 0.61–1.24)
GFR, Estimated: >60 mL/min (ref >60)
Glucose, Bld: 84 mg/dL (ref 70–99)
Potassium: 3.5 mmol/L (ref 3.5–5.1)
Sodium: 137 mmol/L (ref 135–145)
Total Bilirubin: 0.7 mg/dL (ref 0.0–1.2)
Total Protein: 6.3 g/dL — ABNORMAL LOW (ref 6.5–8.1)

## 2023-09-15 LAB — CBC WITH DIFFERENTIAL (CANCER CENTER ONLY)
Abs Immature Granulocytes: 0.04 10*3/uL (ref 0.00–0.07)
Basophils Absolute: 0.1 10*3/uL (ref 0.0–0.1)
Basophils Relative: 1 %
Eosinophils Absolute: 0.1 10*3/uL (ref 0.0–0.5)
Eosinophils Relative: 1 %
HCT: 40.5 % (ref 39.0–52.0)
Hemoglobin: 13.5 g/dL (ref 13.0–17.0)
Immature Granulocytes: 0 %
Lymphocytes Relative: 54 %
Lymphs Abs: 5.8 10*3/uL — ABNORMAL HIGH (ref 0.7–4.0)
MCH: 30.4 pg (ref 26.0–34.0)
MCHC: 33.3 g/dL (ref 30.0–36.0)
MCV: 91.2 fL (ref 80.0–100.0)
Monocytes Absolute: 0.5 10*3/uL (ref 0.1–1.0)
Monocytes Relative: 5 %
Neutro Abs: 4.1 10*3/uL (ref 1.7–7.7)
Neutrophils Relative %: 39 %
Platelet Count: 162 10*3/uL (ref 150–400)
RBC: 4.44 MIL/uL (ref 4.22–5.81)
RDW: 13.3 % (ref 11.5–15.5)
WBC Count: 10.6 10*3/uL — ABNORMAL HIGH (ref 4.0–10.5)
nRBC: 0 % (ref 0.0–0.2)

## 2023-09-15 LAB — VITAMIN B12: Vitamin B-12: 353 pg/mL (ref 180–914)

## 2023-09-15 LAB — PSA: Prostatic Specific Antigen: 0.01 ng/mL (ref 0.00–4.00)

## 2023-09-16 ENCOUNTER — Encounter: Payer: Self-pay | Admitting: Sports Medicine

## 2023-09-16 ENCOUNTER — Ambulatory Visit (INDEPENDENT_AMBULATORY_CARE_PROVIDER_SITE_OTHER): Admitting: Sports Medicine

## 2023-09-16 VITALS — BP 102/60 | HR 74 | Temp 96.2°F | Resp 17 | Ht 72.0 in | Wt 210.2 lb

## 2023-09-16 DIAGNOSIS — R5383 Other fatigue: Secondary | ICD-10-CM

## 2023-09-16 DIAGNOSIS — F321 Major depressive disorder, single episode, moderate: Secondary | ICD-10-CM

## 2023-09-16 DIAGNOSIS — Z23 Encounter for immunization: Secondary | ICD-10-CM | POA: Diagnosis not present

## 2023-09-16 DIAGNOSIS — I10 Essential (primary) hypertension: Secondary | ICD-10-CM

## 2023-09-16 DIAGNOSIS — C7800 Secondary malignant neoplasm of unspecified lung: Secondary | ICD-10-CM

## 2023-09-16 DIAGNOSIS — E782 Mixed hyperlipidemia: Secondary | ICD-10-CM

## 2023-09-16 DIAGNOSIS — C61 Malignant neoplasm of prostate: Secondary | ICD-10-CM

## 2023-09-16 MED ORDER — ALENDRONATE SODIUM 70 MG PO TABS: 70.0000 mg | ORAL_TABLET | ORAL | 3 refills | Status: AC

## 2023-09-16 MED ORDER — FLUOXETINE HCL 40 MG PO CAPS: 40.0000 mg | ORAL_CAPSULE | Freq: Every day | ORAL | Status: DC

## 2023-09-16 NOTE — Progress Notes (Signed)
 Careteam: Patient Care Team: [facility] as PCP - General Gean Birchwood, MD as Attending Physician (Orthopedic Surgery) Cherlyn Cushing, RN as Oncology Nurse Navigator Rickard Patience, MD as Consulting Physician (Oncology)  PLACE OF SERVICE:  [facility]  Advanced Directive information    No Known Allergies  Chief Complaint  Patient presents with   Medical Management of Chronic Issues    4 week new patient follow up. Discuss the need for Covid Booster, Pne vaccine, DTAP vaccine, and AWV.      Discussed the use of AI scribe software for clinical note transcription with the patient, who gave verbal consent to proceed.  History of Present Illness    Brian Bean is an 83 year old male with hypertension who presents for  follow up   He has concerns about his blood pressure readings, he is upset about his office readings lower than his bp machine. His bp was checked by different CMA's and both the readings are pretty much about the same. He denies dizziness or lightheadedness.  He was told on last visit to stop amlodipine and hydrochlorothiazide but he is still taking amlodipine 2.5 mg twice daily and hydrochlorothiazide 12.5 mg daily along with lasix.  Recent UTI He was diagnosed with UTI recently and completed 2 courses of antibiotics Denies dysuria, frequency, lower abdominal pain   No pain, blood in the urine, or nausea.  He has a history of allergies and is currently experiencing nasal drainage  . He undergone allergy testing. He uses a nasal spray for symptom management and takes flonase prn  He experiences sleep disturbances, falling asleep quickly but having difficulty returning to sleep after waking. He typically sleeps for eight to nine hours with an hour of interruption. He reports a lack of motivation and energy, which he attributes to his cancer treatment.    He engages in water exercises at the gym four times a week for an hour each session and denies  experiencing chest pain, shortness of breath, or dizziness during exercise. He reports a good appetite and regular bowel movements every morning.  He is currently taking hydrochlorothiazide 12.5 mg, carvedilol 12.5 mg, fluoxetine 40 mg, lisinopril 10 mg, and Flomax 0.4 mg. He has reduced his fluoxetine dose from 80 mg to 40 mg as per his psychiatrist's advice.  Review of Systems:  Review of Systems  Constitutional:  Positive for malaise/fatigue. Negative for chills and fever.  HENT:  Negative for congestion and sore throat.   Eyes:  Negative for double vision.  Respiratory:  Negative for cough, sputum production and shortness of breath.   Cardiovascular:  Negative for chest pain, palpitations and leg swelling.  Gastrointestinal:  Negative for abdominal pain, heartburn and nausea.  Genitourinary:  Negative for dysuria, frequency and hematuria.  Musculoskeletal:  Negative for falls and myalgias.  Neurological:  Negative for dizziness.   Negative unless indicated in HPI.   Past Medical History:  Diagnosis Date   Anxiety    Arthritis    Cancer (HCC)    skin lesion scalp   CLL (chronic lymphocytic leukemia) (HCC) 01/06/2013   CLL (chronic lymphocytic leukemia) (HCC)    Depression    Head injury    fell from a horse   Hypertension    Lymphocytosis    PONV (postoperative nausea and vomiting)    extremely sick did not get sick after hip surgery in 2013   Prostate cancer [facility])    Past Surgical History:  Procedure Laterality Date  APPENDECTOMY     BRONCHIAL BIOPSY  04/16/2021   Procedure: BRONCHIAL BIOPSIES;  Surgeon: Josephine Igo, DO;  Location: [facility];  Service: Pulmonary;;   BRONCHIAL BRUSHINGS  04/16/2021   Procedure: BRONCHIAL BRUSHINGS;  Surgeon: Josephine Igo, DO;  Location: [facility];  Service: Pulmonary;;   BRONCHIAL NEEDLE ASPIRATION BIOPSY  04/16/2021   Procedure: BRONCHIAL NEEDLE ASPIRATION BIOPSIES;  Surgeon: Josephine Igo, DO;  Location: [facility];   Service: Pulmonary;;   BRONCHIAL WASHINGS  04/16/2021   Procedure: BRONCHIAL WASHINGS;  Surgeon: Josephine Igo, DO;  Location: [facility];  Service: Pulmonary;;   CATARACT EXTRACTION, BILATERAL     CERVICAL DISC SURGERY  00   COLONOSCOPY  2011   [facility].    FRACTURE SURGERY     rt ankle   HERNIA REPAIR     rt and  undistended testicle   PROSTATE BIOPSY     REVERSE SHOULDER ARTHROPLASTY Right 05/01/2020   Procedure: REVERSE SHOULDER ARTHROPLASTY;  Surgeon: Christena Flake, MD;  Location: [facility] ORS;  Service: Orthopedics;  Laterality: Right;   rt hip  12   pinned   TOTAL HIP ARTHROPLASTY  05/03/2012   Procedure: TOTAL HIP ARTHROPLASTY;  Surgeon: [name], MD;  Location: [facility] OR;  Service: Orthopedics;  Laterality: Right;   VIDEO BRONCHOSCOPY WITH ENDOBRONCHIAL NAVIGATION Right 04/16/2021   Procedure: VIDEO BRONCHOSCOPY WITH ENDOBRONCHIAL NAVIGATION;  Surgeon: Josephine Igo, DO;  Location: [facility];  Service: Pulmonary;  Laterality: Right;  ION w/ CIOS   VIDEO BRONCHOSCOPY WITH RADIAL ENDOBRONCHIAL ULTRASOUND  04/16/2021   Procedure: RADIAL ENDOBRONCHIAL ULTRASOUND;  Surgeon: Josephine Igo, DO;  Location: [facility];  Service: Pulmonary;;   Social History:   reports that he quit smoking about 46 years ago. His smoking use included cigarettes. He started smoking about 56 years ago. He has a 5 pack-year smoking history. He has never used smokeless tobacco. He reports current alcohol use of about 21.0 standard drinks of alcohol per week. He reports that he does not use drugs.  Family History  Problem Relation Age of Onset   Heart disease Mother    Cancer Father        unk type of cancer    Medications: Patient's Medications  New Prescriptions   No medications on file  Previous Medications   ACETAMINOPHEN (TYLENOL) 500 MG TABLET    Take 500 mg by mouth as needed.   ALENDRONATE (FOSAMAX) 70 MG TABLET    Take 70 mg by mouth once a week. Take with a full glass of water on  an empty stomach.   AMLODIPINE (NORVASC) 5 MG TABLET    Take 2.5 mg by mouth daily.   APALUTAMIDE (ERLEADA) 60 MG TABLET    Take 4 tablets (240 mg total) by mouth daily.   ASCORBIC ACID (VITAMIN C) 1000 MG TABLET    Take 1,000 mg by mouth daily.   ASPIRIN EC 81 MG TABLET    Take 81 mg by mouth daily. Swallow whole.   ATORVASTATIN (LIPITOR) 20 MG TABLET    Take 20 mg by mouth daily.   CARBOXYMETHYLCELLULOSE (REFRESH PLUS) 0.5 % SOLN    Place 1 drop into both eyes as needed.   CARVEDILOL (COREG) 12.5 MG TABLET    Take 12.5 mg by mouth 2 (two) times daily with a meal.    CHOLECALCIFEROL 125 MCG (5000 UT) TABS    Take by mouth.   FLUOROURACIL (EFUDEX) 5 % CREAM    Apply  1 application  topically as needed (precancerous spots).   FLUOXETINE (PROZAC) 40 MG CAPSULE    Take 2 capsules (80 mg total) by mouth daily.   FLUTICASONE (FLONASE) 50 MCG/ACT NASAL SPRAY    Place 1 spray into both nostrils as needed for allergies or rhinitis.   FUROSEMIDE (LASIX) 20 MG TABLET    Take 20 mg by mouth daily.   GEMTESA 75 MG TABS    Take 1 tablet by mouth daily.   HYDROCHLOROTHIAZIDE (HYDRODIURIL) 12.5 MG TABLET    Take 12.5 mg by mouth daily.   IPRATROPIUM (ATROVENT) 0.06 % NASAL SPRAY    Place 2 sprays into both nostrils as needed for rhinitis.   KETOCONAZOLE (NIZORAL) 2 % SHAMPOO    Apply 1 application  topically daily as needed for irritation.   LEUPROLIDE, 6 MONTH, (ELIGARD) 45 MG INJECTION    Inject 45 mg into the skin every 6 (six) months.   LISINOPRIL (ZESTRIL) 10 MG TABLET    Take 1 tablet (10 mg total) by mouth daily.   MODAFINIL (PROVIGIL) 100 MG TABLET    Take 100 mg by mouth daily.   TAMSULOSIN (FLOMAX) 0.4 MG CAPS CAPSULE    Take 0.4 mg by mouth daily.  Modified Medications   No medications on file  Discontinued Medications   No medications on file    Physical Exam: Vitals:   09/16/23 1356  BP: 102/60  Pulse: 74  Resp: 17  Temp: (!) 96.2 F (35.7 C)  SpO2: 95%  Weight: 210 lb 3.2 oz (95.3  kg)  Height: 6' (1.829 m)   Body mass index is 28.51 kg/m. BP Readings from Last 3 Encounters:  09/16/23 102/60  08/11/23 100/60  06/25/23 129/60   Wt Readings from Last 3 Encounters:  09/16/23 210 lb 3.2 oz (95.3 kg)  08/11/23 213 lb 9.6 oz (96.9 kg)  06/25/23 210 lb 12.8 oz (95.6 kg)    Physical Exam Constitutional:      Appearance: Normal appearance.  HENT:     Head: Normocephalic and atraumatic.  Cardiovascular:     Rate and Rhythm: Normal rate and regular rhythm.     Pulses: Normal pulses.     Heart sounds: Normal heart sounds.  Pulmonary:     Effort: No respiratory distress.     Breath sounds: No stridor. No wheezing or rales.  Abdominal:     General: Bowel sounds are normal. There is no distension.     Palpations: Abdomen is soft.     Tenderness: There is no abdominal tenderness. There is no right CVA tenderness or guarding.  Musculoskeletal:        General: No swelling.  Neurological:     Mental Status: He is alert. Mental status is at baseline.     Motor: No weakness.     Labs reviewed: Basic Metabolic Panel: Recent Labs    12/25/22 1506 03/24/23 1346 06/23/23 1444 08/11/23 1111 09/15/23 1419  NA  --  135 137  --  137  K  --  3.2* 3.6  --  3.5  CL  --  101 102  --  99  CO2  --  25 26  --  28  GLUCOSE  --  107* 81  --  84  BUN  --  22 20  --  25*  CREATININE  --  0.88 0.75  --  0.99  CALCIUM  --  8.8* 9.0  --  8.8*  TSH 1.763  --   --  1.64  --    Liver Function Tests: Recent Labs    03/24/23 1346 06/23/23 1444 09/15/23 1419  AST 18 18 17   ALT 16 14 15   ALKPHOS 46 45 44  BILITOT 0.6 0.5 0.7  PROT 6.9 6.2* 6.3*  ALBUMIN 3.7 3.8 4.0   No results for input(s): "LIPASE", "AMYLASE" in the last 8760 hours. No results for input(s): "AMMONIA" in the last 8760 hours. CBC: Recent Labs    03/24/23 1346 06/23/23 1445 09/15/23 1419  WBC 12.8* 13.3* 10.6*  NEUTROABS 6.1 4.8 4.1  HGB 13.2 13.4 13.5  HCT 38.5* 40.6 40.5  MCV 90.8 92.5 91.2   PLT 222 182 162   Lipid Panel: No results for input(s): "CHOL", "HDL", "LDLCALC", "TRIG", "CHOLHDL", "LDLDIRECT" in the last 8760 hours. TSH: Recent Labs    12/25/22 1506 08/11/23 1111  TSH 1.763 1.64   A1C: Lab Results  Component Value Date   HGBA1C 5.6 08/11/2023    Assessment and Plan Assessment & Plan  1. Immunization due  - Pneumococcal conjugate vaccine 20-valent (Prevnar 20)  2. Primary hypertension (Primary) Pt wants to take hydrochlorothiazide, amlodipine , instructed him to take either hydrochlorothiazide or Lasix but not both  Will stop lasix Cont with amlodipine, lisinopril Monitor for dizziness  3. Malignant neoplasm of prostate metastatic to lung [facility]) Asymptomatic  Psa <0.1 Follow up with oncology  4. Other fatigue Labs stable Increase physical activity  Cont with prozac  5. Mixed hyperlipidemia Cont with pra  6. Current moderate episode of major depressive disorder without prior episode (HCC) Cont with  lipitor   Allergic rhinitis  Continue Flonase nasal spray.         Other orders - hydrochlorothiazide (HYDRODIURIL) 12.5 MG tablet; Take 12.5 mg by mouth daily. - modafinil (PROVIGIL) 100 MG tablet; Take 100 mg by mouth daily. - amLODipine (NORVASC) 5 MG tablet; Take 2.5 mg by mouth daily. - alendronate (FOSAMAX) 70 MG tablet; Take 1 tablet (70 mg total) by mouth once a week. Take with a full glass of water on an empty stomach.  Dispense: 12 tablet; Refill: 3 - FLUoxetine (PROZAC) 40 MG capsule; Take 1 capsule (40 mg total) by mouth daily.      No follow-ups on file.:

## 2023-09-23 ENCOUNTER — Inpatient Hospital Stay: Payer: Medicare Other

## 2023-09-23 ENCOUNTER — Inpatient Hospital Stay: Payer: Medicare Other | Admitting: Oncology

## 2023-09-23 ENCOUNTER — Encounter: Payer: Self-pay | Admitting: Oncology

## 2023-09-23 VITALS — BP 129/64 | HR 64 | Temp 98.0°F | Resp 18 | Wt 213.6 lb

## 2023-09-23 DIAGNOSIS — Z79899 Other long term (current) drug therapy: Secondary | ICD-10-CM | POA: Diagnosis not present

## 2023-09-23 DIAGNOSIS — C9110 Chronic lymphocytic leukemia of B-cell type not having achieved remission: Secondary | ICD-10-CM

## 2023-09-23 DIAGNOSIS — Z79818 Long term (current) use of other agents affecting estrogen receptors and estrogen levels: Secondary | ICD-10-CM | POA: Diagnosis not present

## 2023-09-23 DIAGNOSIS — C7800 Secondary malignant neoplasm of unspecified lung: Secondary | ICD-10-CM

## 2023-09-23 DIAGNOSIS — C7801 Secondary malignant neoplasm of right lung: Secondary | ICD-10-CM | POA: Diagnosis not present

## 2023-09-23 DIAGNOSIS — C7802 Secondary malignant neoplasm of left lung: Secondary | ICD-10-CM | POA: Diagnosis not present

## 2023-09-23 DIAGNOSIS — C61 Malignant neoplasm of prostate: Secondary | ICD-10-CM | POA: Diagnosis not present

## 2023-09-23 DIAGNOSIS — Z5111 Encounter for antineoplastic chemotherapy: Secondary | ICD-10-CM | POA: Diagnosis not present

## 2023-09-23 DIAGNOSIS — C3431 Malignant neoplasm of lower lobe, right bronchus or lung: Secondary | ICD-10-CM | POA: Diagnosis not present

## 2023-09-23 MED ORDER — LEUPROLIDE ACETATE (6 MONTH) 45 MG ~~LOC~~ KIT
45.0000 mg | PACK | Freq: Once | SUBCUTANEOUS | Status: AC
  Administered 2023-09-23: 45 mg via SUBCUTANEOUS

## 2023-09-23 NOTE — Assessment & Plan Note (Addendum)
 Eligard 45mg  Q6 months. -  Next due in Oct 2025

## 2023-09-23 NOTE — Assessment & Plan Note (Signed)
Diagnosed on 07/08/2012.  Currently on watchful waiting

## 2023-09-23 NOTE — Progress Notes (Signed)
 Hematology/Oncology Progress note [phone] [phone]    CHIEF COMPLAINTS/REASON FOR VISIT:  Prostate cancer, CLL  ASSESSMENT & PLAN:   Cancer Staging  CLL (chronic lymphocytic leukemia) (HCC) Staging form: Chronic Lymphocytic Leukemia / Small Lymphocytic Lymphoma, AJCC 8th Edition - Clinical stage from 01/10/2021: Modified Rai Stage 0 (Modified Rai risk: Low, Lymphocytosis: Present, Adenopathy: Absent, Organomegaly: Absent, Anemia: Absent, Thrombocytopenia: Absent) - Signed by Brian Delay, MD on 01/10/2021  Prostate cancer [name]) Staging form: Prostate, AJCC 8th Edition - Clinical: Stage IVB (ycTX, cNX, cM1) - Signed by Brian Patience, MD on 10/08/2021   Prostate cancer (HCC) Stage IV castration sensitive prostate cancer with multiple radiotracer avid lung nodules on PSMA. Previous lung nodule biopsy results are consistent with malignant cells, non-small cell carcinoma, not enough tissue for further work-up for tissue origin. Nodules are avid on PSMA, likely metastatic prostate cancer lung involvement.  Continue Adrogen deprivation therapy.- today, next due Lupron 45mg  Q6 months - Oct 2025 Labs are reviewed and discussed with patient. PSA 0.01 Continue Apalutamide 240mg  daily Repeat PSMA PET scan showed excellent response. I reviewed the images with patient.   CLL (chronic lymphocytic leukemia) (HCC) Diagnosed on 07/08/2012.  Currently on watchful waiting  Encounter for monitoring androgen deprivation therapy Eligard 45mg  Q6 months. -  Next due in Oct 2025   Encounter for antineoplastic chemotherapy Treatment plan as listed above.  Orders Placed This Encounter  Procedures   CBC with Differential (Cancer Center Only)    Standing Status:   Future    Expected Date:   12/23/2023    Expiration Date:   09/22/2024   CMP (Cancer Center only)    Standing Status:   Future    Expected Date:   12/23/2023    Expiration Date:   09/22/2024   PSA    Standing Status:   Future     Expected Date:   12/23/2023    Expiration Date:   09/22/2024     Follow-up in 3 months All questions were answered. The patient knows to call the clinic with any problems questions or concerns.   Brian Patience, MD, PhD [facility] Hematology Oncology 09/23/2023    HISTORY OF PRESENTING ILLNESS:   Brian Bean is a  83 y.o.  male with PMH listed below was seen in consultation at the request of  Brian Bean, *  for evaluation of prostate cancer.  Patient's oncology care was previously with Dr. Eli Bean.  Patient presents for second opinion and prefers to transfer care to our cancer center. I reviewed patient's previous oncology records.  Oncology History  CLL (chronic lymphocytic leukemia) (HCC)  07/08/2012 Pathology Results   Accession #: [id number]  peripheral blood comfirmed CLL   2015 Initial Diagnosis   #History of prostate cancer, initially diagnosed in June 2015, Gleason score of 6, patient has been on active surveillance between 2015-2019, PSA gradually increasing up to 16.9.   06/24/2016 Pathology Results   CLL FISH showed 3.67% positive for deletion 13 q and 5% positive for p53 deletion   01/10/2021 Cancer Staging   Staging form: Chronic Lymphocytic Leukemia / Small Lymphocytic Lymphoma, AJCC 8th Edition - Clinical stage from 01/10/2021: Modified Rai Stage 0 (Modified Rai risk: Low, Lymphocytosis: Present, Adenopathy: Absent, Organomegaly: Absent, Anemia: Absent, Thrombocytopenia: Absent) - Signed by Brian Delay, MD on 01/10/2021 Stage prefix: Initial diagnosis   07/02/2022 -  Chemotherapy   Started on apalutamide 240mg     Prostate cancer (HCC)  2015 Initial Diagnosis   #History  of prostate cancer, initially diagnosed in June 2015, Gleason score of 6, patient has been on active surveillance between 2015-2019, PSA gradually increasing up to 16.9.   06/24/2016 Initial Diagnosis   Prostate cancer (HCC)   12/28/2020 Imaging   12/28/2020, PSMA at [facility] showed multiple  bilateral radiotracer avid pulmonary nodules.  For example right upper lobe 1.2 x 1.4 cm and the left lower lobe 1.5 x 1.3 cm.  Prostate showed no focal radiotracer uptake to suggest local recurrence.  No PSMA avid pelvic, retroperitoneal, or mesenteric adenopathy.  Multiple pulmonary radiotracer avid nodules concerning for metastatic disease.  No osseous metastatic disease.  Severe three-vessel coronary artery calcifications   04/16/2021 Initial Biopsy   patient underwent biopsy via bronchoscopy. Lung R LL biopsy showed malignant cells consistent with non-small cell carcinoma. Lung RLL brushing showed malignant cells consistent with non-small cell carcinoma, Lung R UL brushing showed no malignant cells. Lung R UL biopsy showed malignant cells consistent with non-small cell carcinoma. Lung RUL lavage showed no malignant cells identified. Immunostains for TTF-1, Napsin A, p63 and CK5/6 were attempted but are noncontributory.  There is insufficient tumor tissue on the cellblock for additional studies.     05/2021 Tumor Marker   PSA 8.7   05/10/2021 Miscellaneous   05/10/2021, started on androgen deprivation therapy. Eligard 30 mg every 4 months   08/2021 Tumor Marker   PSA 0.2   10/08/2021 Cancer Staging   Staging form: Prostate, AJCC 8th Edition - Clinical: Stage IVB (ycTX, cNX, cM1) - Signed by Brian Patience, MD on 10/08/2021 Stage prefix: Post-therapy   10/23/2021 Imaging   PSMA PET scan showed Small bilateral pulmonary metastases show intense radiopharmaceutical uptake, which show slight decrease in size since prior outside PET-CT. No new or progressive metastatic disease.   11/08/2021 Imaging   CT chest wo contrast showed the scattered pulmonary nodules are mildly reduced in size compared to 04/16/2021. No new nodules identified.   02/07/2022 Procedure   Status post left pleural effusion thoracentesis -cytology not diagnostic of malignancy.   07/02/2022 -  Chemotherapy   07/02/2022 apalutamide  240mg .     07/15/2022 Tumor Marker   PSA <0.01   08/13/2022 Tumor Marker   PSA <0.01   03/24/2023 Tumor Marker   PSA <0.01    INTERVAL HISTORY Brian Bean is a 83 y.o. male who has above history reviewed by me today presents for follow up visit for prostate cancer. Patient reports feeling well.   07/02/2022 apalutamide 240mg  daily.  Overall he tolerates treatment with no significant side effects. Denies SOB, cough. Patient takes oral vitamin B12 supplementation.   Review of Systems  Constitutional:  Positive for fatigue. Negative for appetite change, chills, fever and unexpected weight change.  HENT:   Negative for hearing loss and voice change.   Eyes:  Negative for eye problems and icterus.  Respiratory:  Negative for chest tightness, cough and shortness of breath.   Cardiovascular:  Negative for chest pain and leg swelling.  Gastrointestinal:  Negative for abdominal distention and abdominal pain.  Endocrine: Positive for hot flashes.  Genitourinary:  Negative for difficulty urinating, dysuria and frequency.   Musculoskeletal:  Negative for arthralgias.  Skin:  Negative for itching and rash.  Neurological:  Negative for light-headedness and numbness.  Hematological:  Negative for adenopathy. Does not bruise/bleed easily.  Psychiatric/Behavioral:  Negative for confusion.     MEDICAL HISTORY:  Past Medical History:  Diagnosis Date   Anxiety    Arthritis  Cancer (HCC)    skin lesion scalp   CLL (chronic lymphocytic leukemia) (HCC) 01/06/2013   CLL (chronic lymphocytic leukemia) (HCC)    Depression    Head injury    fell from a horse   Hypertension    Lymphocytosis    PONV (postoperative nausea and vomiting)    extremely sick did not get sick after hip surgery in 2013   Prostate cancer [facility])     SURGICAL HISTORY: Past Surgical History:  Procedure Laterality Date   APPENDECTOMY     BRONCHIAL BIOPSY  04/16/2021   Procedure: BRONCHIAL BIOPSIES;  Surgeon: Josephine Igo, DO;  Location: [facility];  Service: Pulmonary;;   BRONCHIAL BRUSHINGS  04/16/2021   Procedure: BRONCHIAL BRUSHINGS;  Surgeon: Josephine Igo, DO;  Location: [facility];  Service: Pulmonary;;   BRONCHIAL NEEDLE ASPIRATION BIOPSY  04/16/2021   Procedure: BRONCHIAL NEEDLE ASPIRATION BIOPSIES;  Surgeon: Josephine Igo, DO;  Location: [facility];  Service: Pulmonary;;   BRONCHIAL WASHINGS  04/16/2021   Procedure: BRONCHIAL WASHINGS;  Surgeon: Josephine Igo, DO;  Location: [facility];  Service: Pulmonary;;   CATARACT EXTRACTION, BILATERAL     CERVICAL DISC SURGERY  00   COLONOSCOPY  2011   [facility].    FRACTURE SURGERY     rt ankle   HERNIA REPAIR     rt and  undistended testicle   PROSTATE BIOPSY     REVERSE SHOULDER ARTHROPLASTY Right 05/01/2020   Procedure: REVERSE SHOULDER ARTHROPLASTY;  Surgeon: Christena Flake, MD;  Location: [facility] ORS;  Service: Orthopedics;  Laterality: Right;   rt hip  12   pinned   TOTAL HIP ARTHROPLASTY  05/03/2012   Procedure: TOTAL HIP ARTHROPLASTY;  Surgeon: [name], MD;  Location: [facility] OR;  Service: Orthopedics;  Laterality: Right;   VIDEO BRONCHOSCOPY WITH ENDOBRONCHIAL NAVIGATION Right 04/16/2021   Procedure: VIDEO BRONCHOSCOPY WITH ENDOBRONCHIAL NAVIGATION;  Surgeon: Josephine Igo, DO;  Location: [facility];  Service: Pulmonary;  Laterality: Right;  ION w/ CIOS   VIDEO BRONCHOSCOPY WITH RADIAL ENDOBRONCHIAL ULTRASOUND  04/16/2021   Procedure: RADIAL ENDOBRONCHIAL ULTRASOUND;  Surgeon: Josephine Igo, DO;  Location: [facility];  Service: Pulmonary;;    SOCIAL HISTORY: Social History   Socioeconomic History   Marital status: Divorced    Spouse name: Not on file   Number of children: 1   Years of education: Not on file   Highest education level: Not on file  Occupational History    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[employer])  Tobacco Use   Smoking status: Former    Current packs/day: 0.00    Average packs/day:  0.5 packs/day for 10.0 years (5.0 ttl pk-yrs)    Types: Cigarettes    Start date: 04/29/1967    Quit date: 04/28/1977    Years since quitting: 46.4   Smokeless tobacco: Never   Tobacco comments:    10 oz alcohol wkly  Vaping Use   Vaping status: Never Used  Substance and Sexual Activity   Alcohol use: Yes    Alcohol/week: 21.0 standard drinks of alcohol    Types: 5 Glasses of wine, 16 Shots of liquor per week    Comment: 14 ounces a week   Drug use: No   Sexual activity: Yes  Other Topics Concern   Not on file  Social History Narrative   Retired.   Significant other: Randa Spike   One son, Luisa Hart      Tobacco use, amount per day  now: 0   Past tobacco use, amount per day: 1/2-1 pack   How many years did you use tobacco: 12   Alcohol use (drinks per week): 7   Diet: Good   Do you drink/eat things with caffeine: Hell yes   Marital status: Widowed                                  What year were you married? 1966   Do you live in a house, apartment, assisted living, condo, trailer, etc.? House   Is it one or more stories? One   How many persons live in your home? 2   Do you have pets in your home?( please list) 2 Dogs, 1 Cat   Highest Level of education completed? Masters    Current or past profession: Sales   Do you exercise?  Yes                                Type and how often? Water exercise   Do you have a living will? Yes   Do you have a DNR form?     Yes                              If not, do you want to discuss one?   Do you have signed POA/HPOA forms?  Yes                      If so, please bring to you appointment      Do you have any difficulty bathing or dressing yourself? No   Do you have any difficulty preparing food or eating? No   Do you have any difficulty managing your medications? No   Do you have any difficulty managing your finances? No   Do you have any difficulty affording your medications? No   Social Drivers of Corporate investment banker  Strain: Not on file  Food Insecurity: No Food Insecurity (09/16/2023)   Hunger Vital Sign    Worried About Running Out of Food in the Last Year: Never true    Ran Out of Food in the Last Year: Never true  Transportation Needs: No Transportation Needs (09/16/2023)   PRAPARE - Administrator, Civil Service (Medical): No    Lack of Transportation (Non-Medical): No  Physical Activity: Not on file  Stress: Not on file  Social Connections: Moderately Integrated (09/16/2023)   Social Connection and Isolation Panel [NHANES]    Frequency of Communication with Friends and Family: Three times a week    Frequency of Social Gatherings with Friends and Family: Three times a week    Attends Religious Services: More than 4 times per year    Active Member of Clubs or Organizations: Yes    Attends Banker Meetings: 1 to 4 times per year    Marital Status: Divorced  Intimate Partner Violence: Not At Risk (09/16/2023)   Humiliation, Afraid, Rape, and Kick questionnaire    Fear of Current or Ex-Partner: No    Emotionally Abused: No    Physically Abused: No    Sexually Abused: No    FAMILY HISTORY: Family History  Problem Relation Age of Onset   Heart disease Mother    Cancer Father  unk type of cancer    ALLERGIES:  has no known allergies.  MEDICATIONS:  Current Outpatient Medications  Medication Sig Dispense Refill   acetaminophen (TYLENOL) 500 MG tablet Take 500 mg by mouth as needed.     alendronate (FOSAMAX) 70 MG tablet Take 1 tablet (70 mg total) by mouth once a week. Take with a full glass of water on an empty stomach. 12 tablet 3   amLODipine (NORVASC) 5 MG tablet Take 2.5 mg by mouth daily.     apalutamide (ERLEADA) 60 MG tablet Take 4 tablets (240 mg total) by mouth daily. 120 tablet 2   Ascorbic Acid (VITAMIN C) 1000 MG tablet Take 1,000 mg by mouth daily.     aspirin EC 81 MG tablet Take 81 mg by mouth daily. Swallow whole.     atorvastatin (LIPITOR) 20 MG  tablet Take 20 mg by mouth daily.     carboxymethylcellulose (REFRESH PLUS) 0.5 % SOLN Place 1 drop into both eyes as needed.     carvedilol (COREG) 12.5 MG tablet Take 12.5 mg by mouth 2 (two) times daily with a meal.      Cholecalciferol 125 MCG (5000 UT) TABS Take by mouth.     fluorouracil (EFUDEX) 5 % cream Apply 1 application  topically as needed (precancerous spots).     FLUoxetine (PROZAC) 40 MG capsule Take 1 capsule (40 mg total) by mouth daily.     fluticasone (FLONASE) 50 MCG/ACT nasal spray Place 1 spray into both nostrils as needed for allergies or rhinitis.     GEMTESA 75 MG TABS Take 1 tablet by mouth daily.     hydrochlorothiazide (HYDRODIURIL) 12.5 MG tablet Take 12.5 mg by mouth daily.     ipratropium (ATROVENT) 0.06 % nasal spray Place 2 sprays into both nostrils as needed for rhinitis.     ketoconazole (NIZORAL) 2 % shampoo Apply 1 application  topically daily as needed for irritation.     leuprolide, 6 Month, (ELIGARD) 45 MG injection Inject 45 mg into the skin every 6 (six) months.     lisinopril (ZESTRIL) 10 MG tablet Take 1 tablet (10 mg total) by mouth daily. 90 tablet 1   modafinil (PROVIGIL) 100 MG tablet Take 100 mg by mouth daily.     tamsulosin (FLOMAX) 0.4 MG CAPS capsule Take 0.4 mg by mouth daily.     No current facility-administered medications for this visit.     PHYSICAL EXAMINATION: ECOG PERFORMANCE STATUS: 1 - Symptomatic but completely ambulatory Vitals:   09/23/23 1456  BP: 129/64  Pulse: 64  Resp: 18  Temp: 98 F (36.7 C)  SpO2: 98%    Filed Weights   09/23/23 1456  Weight: 213 lb 9.6 oz (96.9 kg)     Physical Exam Constitutional:      General: He is not in acute distress. HENT:     Head: Normocephalic and atraumatic.  Eyes:     General: No scleral icterus. Cardiovascular:     Rate and Rhythm: Normal rate and regular rhythm.     Heart sounds: Normal heart sounds.  Pulmonary:     Effort: Pulmonary effort is normal. No  respiratory distress.     Breath sounds: No wheezing.  Abdominal:     General: Bowel sounds are normal. There is no distension.     Palpations: Abdomen is soft.  Musculoskeletal:        General: No deformity. Normal range of motion.     Cervical back: Normal range  of motion and neck supple.  Skin:    General: Skin is warm and dry.     Findings: No erythema or rash.  Neurological:     Mental Status: He is alert and oriented to person, place, and time. Mental status is at baseline.     Cranial Nerves: No cranial nerve deficit.     Coordination: Coordination normal.  Psychiatric:        Mood and Affect: Mood normal.     LABORATORY DATA:  I have reviewed the data as listed Lab Results  Component Value Date   WBC 10.6 (H) 09/15/2023   HGB 13.5 09/15/2023   HCT 40.5 09/15/2023   MCV 91.2 09/15/2023   PLT 162 09/15/2023   Recent Labs    03/24/23 1346 06/23/23 1444 09/15/23 1419  NA 135 137 137  K 3.2* 3.6 3.5  CL 101 102 99  CO2 25 26 28   GLUCOSE 107* 81 84  BUN 22 20 25*  CREATININE 0.88 0.75 0.99  CALCIUM 8.8* 9.0 8.8*  GFRNONAA >60 >60 >60  PROT 6.9 6.2* 6.3*  ALBUMIN 3.7 3.8 4.0  AST 18 18 17   ALT 16 14 15   ALKPHOS 46 45 44  BILITOT 0.6 0.5 0.7

## 2023-09-23 NOTE — Assessment & Plan Note (Addendum)
 Stage IV castration sensitive prostate cancer with multiple radiotracer avid lung nodules on PSMA. Previous lung nodule biopsy results are consistent with malignant cells, non-small cell carcinoma, not enough tissue for further work-up for tissue origin. Nodules are avid on PSMA, likely metastatic prostate cancer lung involvement.  Continue Adrogen deprivation therapy.- today, next due Lupron 45mg  Q6 months - Oct 2025 Labs are reviewed and discussed with patient. PSA 0.01 Continue Apalutamide 240mg  daily Repeat PSMA PET scan showed excellent response. I reviewed the images with patient.

## 2023-09-23 NOTE — Assessment & Plan Note (Signed)
 Treatment plan as listed above.

## 2023-10-14 DIAGNOSIS — K118 Other diseases of salivary glands: Secondary | ICD-10-CM | POA: Diagnosis not present

## 2023-10-14 DIAGNOSIS — Z85828 Personal history of other malignant neoplasm of skin: Secondary | ICD-10-CM | POA: Diagnosis not present

## 2023-10-14 DIAGNOSIS — D044 Carcinoma in situ of skin of scalp and neck: Secondary | ICD-10-CM | POA: Diagnosis not present

## 2023-10-14 DIAGNOSIS — Z1283 Encounter for screening for malignant neoplasm of skin: Secondary | ICD-10-CM | POA: Diagnosis not present

## 2023-10-14 DIAGNOSIS — D492 Neoplasm of unspecified behavior of bone, soft tissue, and skin: Secondary | ICD-10-CM | POA: Diagnosis not present

## 2023-10-14 DIAGNOSIS — D692 Other nonthrombocytopenic purpura: Secondary | ICD-10-CM | POA: Diagnosis not present

## 2023-10-14 DIAGNOSIS — K1120 Sialoadenitis, unspecified: Secondary | ICD-10-CM | POA: Diagnosis not present

## 2023-10-14 DIAGNOSIS — L570 Actinic keratosis: Secondary | ICD-10-CM | POA: Diagnosis not present

## 2023-10-16 DIAGNOSIS — M5416 Radiculopathy, lumbar region: Secondary | ICD-10-CM | POA: Diagnosis not present

## 2023-10-16 DIAGNOSIS — M9903 Segmental and somatic dysfunction of lumbar region: Secondary | ICD-10-CM | POA: Diagnosis not present

## 2023-10-16 DIAGNOSIS — M62830 Muscle spasm of back: Secondary | ICD-10-CM | POA: Diagnosis not present

## 2023-10-16 DIAGNOSIS — M9904 Segmental and somatic dysfunction of sacral region: Secondary | ICD-10-CM | POA: Diagnosis not present

## 2023-10-19 ENCOUNTER — Encounter: Payer: Self-pay | Admitting: Sports Medicine

## 2023-10-19 DIAGNOSIS — M9904 Segmental and somatic dysfunction of sacral region: Secondary | ICD-10-CM | POA: Diagnosis not present

## 2023-10-19 DIAGNOSIS — M9903 Segmental and somatic dysfunction of lumbar region: Secondary | ICD-10-CM | POA: Diagnosis not present

## 2023-10-19 DIAGNOSIS — M5416 Radiculopathy, lumbar region: Secondary | ICD-10-CM | POA: Diagnosis not present

## 2023-10-19 DIAGNOSIS — M62830 Muscle spasm of back: Secondary | ICD-10-CM | POA: Diagnosis not present

## 2023-10-21 ENCOUNTER — Ambulatory Visit: Admitting: Sports Medicine

## 2023-10-21 ENCOUNTER — Encounter: Payer: Self-pay | Admitting: Sports Medicine

## 2023-10-21 VITALS — BP 120/80 | HR 70 | Temp 97.6°F | Resp 21 | Ht 72.0 in | Wt 213.6 lb

## 2023-10-21 DIAGNOSIS — G5603 Carpal tunnel syndrome, bilateral upper limbs: Secondary | ICD-10-CM | POA: Diagnosis not present

## 2023-10-21 DIAGNOSIS — I10 Essential (primary) hypertension: Secondary | ICD-10-CM | POA: Diagnosis not present

## 2023-10-21 DIAGNOSIS — M546 Pain in thoracic spine: Secondary | ICD-10-CM | POA: Diagnosis not present

## 2023-10-21 DIAGNOSIS — M5416 Radiculopathy, lumbar region: Secondary | ICD-10-CM | POA: Diagnosis not present

## 2023-10-21 DIAGNOSIS — Z23 Encounter for immunization: Secondary | ICD-10-CM

## 2023-10-21 DIAGNOSIS — M9904 Segmental and somatic dysfunction of sacral region: Secondary | ICD-10-CM | POA: Diagnosis not present

## 2023-10-21 DIAGNOSIS — M9903 Segmental and somatic dysfunction of lumbar region: Secondary | ICD-10-CM | POA: Diagnosis not present

## 2023-10-21 DIAGNOSIS — M62830 Muscle spasm of back: Secondary | ICD-10-CM | POA: Diagnosis not present

## 2023-10-21 MED ORDER — HYDROCHLOROTHIAZIDE 12.5 MG PO TABS: 12.5000 mg | ORAL_TABLET | Freq: Every day | ORAL | 3 refills | Status: DC

## 2023-10-21 MED ORDER — BACLOFEN 5 MG PO TABS: 5.0000 mg | ORAL_TABLET | Freq: Two times a day (BID) | ORAL | 0 refills | Status: DC

## 2023-10-21 MED ORDER — WRIST BRACE MISC: 1.0000 | Freq: Every day | 0 refills | Status: AC

## 2023-10-21 NOTE — Progress Notes (Signed)
 Careteam: Patient Care Team: Tye Gall, MD as PCP - General (Internal Medicine) Wendolyn Hamburger, MD as Attending Physician (Orthopedic Surgery) Katheleen Palmer, RN as Oncology Nurse Navigator Timmy Forbes, MD as Consulting Physician (Oncology)  PLACE OF SERVICE:  [facility]  Advanced Directive information    No Known Allergies  Chief Complaint  Patient presents with   Medical Management of Chronic Issues          Discussed the use of AI scribe software for clinical note transcription with the patient, who gave verbal consent to proceed.  History of Present Illness Brian Bean is an 83 year old male who presents with back pain and tingling in the fingers following a recent fall.  He has been experiencing back pain and tingling in his fingers following a fall two weeks ago. He tripped over a parking barrier and fell forward, resulting in persistent back pain. The pain is positional, sometimes escalating to a 7 or 8 out of 10 in severity, located in the mid-back and radiating to the sides without extending down the legs. It worsens with bending over. Lidocaine  patches, Aleve, and chiropractic adjustments have provided some relief. He takes two 200 mg tablets of Aleve twice a day.  He also experiences tingling and numbness in the thumb and index finger of both hands for over a week. These symptoms are not associated with neck pain or weakness in hand muscles. The tingling is relieved when he changes the way he holds the steering wheel of his new car.  He has a history of spinal issues,went to see  chiropractor who ordered x rays, pt says that the chiropractor informed spinal misalignment and compressed vertebrae, but no fractures were noted.  He can walk about twenty yards before feeling tiredness and shortness of breath, which he attributes to the medication he is taking to reduce testosterone  for cancer treatment. No neck pain, pain radiating down the legs, or weakness in hand  muscles. No issues with urination, no blood in urine or stool, and no significant respiratory symptoms aside from pollen-related issues. He experiences stomach pain only when it is empty.     Review of Systems:  Review of Systems  Constitutional:  Negative for chills and fever.  HENT:  Negative for congestion and sore throat.   Eyes:  Negative for double vision.  Respiratory:  Negative for cough, sputum production and shortness of breath.   Cardiovascular:  Negative for chest pain, palpitations and leg swelling.  Gastrointestinal:  Negative for abdominal pain, heartburn and nausea.  Genitourinary:  Negative for dysuria, frequency and hematuria.  Musculoskeletal:  Positive for back pain and falls. Negative for myalgias.  Neurological:  Positive for sensory change. Negative for dizziness and focal weakness.   Negative unless indicated in HPI.   Past Medical History:  Diagnosis Date   Anxiety    Arthritis    Cancer (HCC)    skin lesion scalp   CLL (chronic lymphocytic leukemia) (HCC) 01/06/2013   CLL (chronic lymphocytic leukemia) (HCC)    Depression    Head injury    fell from a horse   Hypertension    Lymphocytosis    PONV (postoperative nausea and vomiting)    extremely sick did not get sick after hip surgery in 2013   Prostate cancer [facility])    Past Surgical History:  Procedure Laterality Date   APPENDECTOMY     BRONCHIAL BIOPSY  04/16/2021   Procedure: BRONCHIAL BIOPSIES;  Surgeon: Prudy Brownie, DO;  [facility];  Service: Pulmonary;;   BRONCHIAL BRUSHINGS  04/16/2021   Procedure: BRONCHIAL BRUSHINGS;  Surgeon: Prudy Brownie, DO;  [facility];  Service: Pulmonary;;   BRONCHIAL NEEDLE ASPIRATION BIOPSY  04/16/2021   Procedure: BRONCHIAL NEEDLE ASPIRATION BIOPSIES;  Surgeon: Prudy Brownie, DO;  [facility];  Service: Pulmonary;;   BRONCHIAL WASHINGS  04/16/2021   Procedure: BRONCHIAL WASHINGS;  Surgeon: Prudy Brownie, DO;  [facility];  Service: Pulmonary;;   CATARACT EXTRACTION, BILATERAL     CERVICAL DISC SURGERY  00   COLONOSCOPY  2011   [facility].    FRACTURE SURGERY     rt ankle   HERNIA REPAIR     rt and  undistended testicle   PROSTATE BIOPSY     REVERSE SHOULDER ARTHROPLASTY Right 05/01/2020   Procedure: REVERSE SHOULDER ARTHROPLASTY;  Surgeon: Elner Hahn, MD;  Location: [facility] ORS;  Service: Orthopedics;  Laterality: Right;   rt hip  12   pinned   TOTAL HIP ARTHROPLASTY  05/03/2012   Procedure: TOTAL HIP ARTHROPLASTY;  Surgeon: Ilean Mall, MD;  [facility];  Service: Orthopedics;  Laterality: Right;   VIDEO BRONCHOSCOPY WITH ENDOBRONCHIAL NAVIGATION Right 04/16/2021   Procedure: VIDEO BRONCHOSCOPY WITH ENDOBRONCHIAL NAVIGATION;  Surgeon: Prudy Brownie, DO;  [facility];  Service: Pulmonary;  Laterality: Right;  ION w/ CIOS   VIDEO BRONCHOSCOPY WITH RADIAL ENDOBRONCHIAL ULTRASOUND  04/16/2021   Procedure: RADIAL ENDOBRONCHIAL ULTRASOUND;  Surgeon: Prudy Brownie, DO;  [facility];  Service: Pulmonary;;   Social History:   reports that he quit smoking about 46 years ago. His smoking use included cigarettes. He started smoking about 56 years ago. He has a 5 pack-year smoking history. He has never used smokeless tobacco. He reports current alcohol use of about 21.0 standard drinks of alcohol per week. He reports that he does not use drugs.  Family History  Problem Relation Age of Onset   Heart disease Mother    Cancer Father        unk type of cancer    Medications: Patient's Medications  New Prescriptions   No medications on file  Previous Medications   ACETAMINOPHEN  (TYLENOL ) 500 MG TABLET    Take 500 mg by mouth as needed.   ALENDRONATE  (FOSAMAX ) 70 MG TABLET    Take 1 tablet (70 mg total) by mouth once a week. Take with a full glass of water  on an empty stomach.   AMLODIPINE  (NORVASC ) 5 MG TABLET    Take 2.5 mg by mouth daily.   APALUTAMIDE  (ERLEADA ) 60 MG  TABLET    Take 4 tablets (240 mg total) by mouth daily.   ASCORBIC ACID (VITAMIN C) 1000 MG TABLET    Take 1,000 mg by mouth daily.   ASPIRIN  EC 81 MG TABLET    Take 81 mg by mouth daily. Swallow whole.   ATORVASTATIN  (LIPITOR) 20 MG TABLET    Take 20 mg by mouth daily.   CARBOXYMETHYLCELLULOSE (REFRESH PLUS) 0.5 % SOLN    Place 1 drop into both eyes as needed.   CARVEDILOL  (COREG ) 12.5 MG TABLET    Take 12.5 mg by mouth 2 (two) times daily with a meal.    CHOLECALCIFEROL 125 MCG (5000 UT) TABS    Take by mouth.   FLUOROURACIL (EFUDEX) 5 % CREAM    Apply 1 application  topically as needed (precancerous spots).   FLUOXETINE  (PROZAC ) 40 MG CAPSULE  Take 1 capsule (40 mg total) by mouth daily.   FLUTICASONE  (FLONASE ) 50 MCG/ACT NASAL SPRAY    Place 1 spray into both nostrils as needed for allergies or rhinitis.   GEMTESA 75 MG TABS    Take 1 tablet by mouth daily.   HYDROCHLOROTHIAZIDE  (HYDRODIURIL ) 12.5 MG TABLET    Take 12.5 mg by mouth daily.   IPRATROPIUM (ATROVENT) 0.06 % NASAL SPRAY    Place 2 sprays into both nostrils as needed for rhinitis.   KETOCONAZOLE (NIZORAL) 2 % SHAMPOO    Apply 1 application  topically daily as needed for irritation.   LEUPROLIDE , 6 MONTH, (ELIGARD ) 45 MG INJECTION    Inject 45 mg into the skin every 6 (six) months.   LISINOPRIL  (ZESTRIL ) 10 MG TABLET    Take 1 tablet (10 mg total) by mouth daily.   MODAFINIL (PROVIGIL) 100 MG TABLET    Take 100 mg by mouth daily.   TAMSULOSIN (FLOMAX) 0.4 MG CAPS CAPSULE    Take 0.4 mg by mouth daily.  Modified Medications   No medications on file  Discontinued Medications   No medications on file    Physical Exam: Vitals:   10/21/23 1048  Height: 6' (1.829 m)   Body mass index is 28.97 kg/m. BP Readings from Last 3 Encounters:  09/23/23 129/64  09/16/23 102/60  08/11/23 100/60   Wt Readings from Last 3 Encounters:  09/23/23 213 lb 9.6 oz (96.9 kg)  09/16/23 210 lb 3.2 oz (95.3 kg)  08/11/23 213 lb 9.6 oz (96.9  kg)    Physical Exam Constitutional:      Appearance: Normal appearance.  HENT:     Head: Normocephalic and atraumatic.  Cardiovascular:     Rate and Rhythm: Normal rate and regular rhythm.     Pulses: Normal pulses.     Heart sounds: Normal heart sounds.  Pulmonary:     Effort: No respiratory distress.     Breath sounds: No stridor. No wheezing or rales.  Abdominal:     General: Bowel sounds are normal. There is no distension.     Palpations: Abdomen is soft.     Tenderness: There is no abdominal tenderness. There is no right CVA tenderness or guarding.  Musculoskeletal:        General: No swelling.     Comments: Hand- sensations slightly decreased on index finger Good grip strength   Back - para spinal tenderness mid back SLR neg Strength intact   Neurological:     Mental Status: He is alert. Mental status is at baseline.     Sensory: No sensory deficit.     Motor: No weakness.     Labs reviewed: Basic Metabolic Panel: Recent Labs    12/25/22 1506 03/24/23 1346 06/23/23 1444 08/11/23 1111 09/15/23 1419  NA  --  135 137  --  137  K  --  3.2* 3.6  --  3.5  CL  --  101 102  --  99  CO2  --  25 26  --  28  GLUCOSE  --  107* 81  --  84  BUN  --  22 20  --  25*  CREATININE  --  0.88 0.75  --  0.99  CALCIUM   --  8.8* 9.0  --  8.8*  TSH 1.763  --   --  1.64  --    Liver Function Tests: Recent Labs    03/24/23 1346 06/23/23 1444 09/15/23 1419  AST 18  18 17  ALT 16 14 15   ALKPHOS 46 45 44  BILITOT 0.6 0.5 0.7  PROT 6.9 6.2* 6.3*  ALBUMIN 3.7 3.8 4.0   No results for input(s): "LIPASE", "AMYLASE" in the last 8760 hours. No results for input(s): "AMMONIA" in the last 8760 hours. CBC: Recent Labs    03/24/23 1346 06/23/23 1445 09/15/23 1419  WBC 12.8* 13.3* 10.6*  NEUTROABS 6.1 4.8 4.1  HGB 13.2 13.4 13.5  HCT 38.5* 40.6 40.5  MCV 90.8 92.5 91.2  PLT 222 182 162   Lipid Panel: No results for input(s): "CHOL", "HDL", "LDLCALC", "TRIG", "CHOLHDL",  "LDLDIRECT" in the last 8760 hours. TSH: Recent Labs    12/25/22 1506 08/11/23 1111  TSH 1.763 1.64   A1C: Lab Results  Component Value Date   HGBA1C 5.6 08/11/2023    Assessment and Plan Assessment & Plan  1. Primary hypertension  At goal - hydrochlorothiazide  (HYDRODIURIL ) 12.5 MG tablet; Take 1 tablet (12.5 mg total) by mouth daily.  Dispense: 30 tablet; Refill: 3  2. Acute bilateral thoracic back pain  No ref flag signs Pt wants to see neurosurgery and made appt today for the referral - Ambulatory referral to Neurosurgery - Baclofen 5 MG TABS; Take 1 tablet (5 mg total) by mouth in the morning and at bedtime.  Dispense: 30 tablet; Refill: 0  3. Bilateral carpal tunnel syndrome  Good hand grip  - Misc. Devices (WRIST BRACE) MISC; 1 Application by Does not apply route daily.  Dispense: 2 each; Refill: 0  4. Immunization due (Primary)   - Tdap vaccine

## 2023-10-23 DIAGNOSIS — M62830 Muscle spasm of back: Secondary | ICD-10-CM | POA: Diagnosis not present

## 2023-10-23 DIAGNOSIS — M9903 Segmental and somatic dysfunction of lumbar region: Secondary | ICD-10-CM | POA: Diagnosis not present

## 2023-10-23 DIAGNOSIS — M9904 Segmental and somatic dysfunction of sacral region: Secondary | ICD-10-CM | POA: Diagnosis not present

## 2023-10-23 DIAGNOSIS — M5416 Radiculopathy, lumbar region: Secondary | ICD-10-CM | POA: Diagnosis not present

## 2023-10-26 DIAGNOSIS — M9903 Segmental and somatic dysfunction of lumbar region: Secondary | ICD-10-CM | POA: Diagnosis not present

## 2023-10-26 DIAGNOSIS — M9904 Segmental and somatic dysfunction of sacral region: Secondary | ICD-10-CM | POA: Diagnosis not present

## 2023-10-26 DIAGNOSIS — M62830 Muscle spasm of back: Secondary | ICD-10-CM | POA: Diagnosis not present

## 2023-10-26 DIAGNOSIS — M5416 Radiculopathy, lumbar region: Secondary | ICD-10-CM | POA: Diagnosis not present

## 2023-10-28 DIAGNOSIS — M9904 Segmental and somatic dysfunction of sacral region: Secondary | ICD-10-CM | POA: Diagnosis not present

## 2023-10-28 DIAGNOSIS — M62830 Muscle spasm of back: Secondary | ICD-10-CM | POA: Diagnosis not present

## 2023-10-28 DIAGNOSIS — M9903 Segmental and somatic dysfunction of lumbar region: Secondary | ICD-10-CM | POA: Diagnosis not present

## 2023-10-28 DIAGNOSIS — M5416 Radiculopathy, lumbar region: Secondary | ICD-10-CM | POA: Diagnosis not present

## 2023-10-30 DIAGNOSIS — M9903 Segmental and somatic dysfunction of lumbar region: Secondary | ICD-10-CM | POA: Diagnosis not present

## 2023-10-30 DIAGNOSIS — M5416 Radiculopathy, lumbar region: Secondary | ICD-10-CM | POA: Diagnosis not present

## 2023-10-30 DIAGNOSIS — M9904 Segmental and somatic dysfunction of sacral region: Secondary | ICD-10-CM | POA: Diagnosis not present

## 2023-10-30 DIAGNOSIS — M62830 Muscle spasm of back: Secondary | ICD-10-CM | POA: Diagnosis not present

## 2023-11-03 ENCOUNTER — Encounter: Payer: Self-pay | Admitting: Physician Assistant

## 2023-11-03 DIAGNOSIS — M9903 Segmental and somatic dysfunction of lumbar region: Secondary | ICD-10-CM | POA: Diagnosis not present

## 2023-11-03 DIAGNOSIS — M9904 Segmental and somatic dysfunction of sacral region: Secondary | ICD-10-CM | POA: Diagnosis not present

## 2023-11-03 DIAGNOSIS — M5416 Radiculopathy, lumbar region: Secondary | ICD-10-CM | POA: Diagnosis not present

## 2023-11-03 DIAGNOSIS — M62830 Muscle spasm of back: Secondary | ICD-10-CM | POA: Diagnosis not present

## 2023-11-03 NOTE — Progress Notes (Unsigned)
 Referring Physician:  Tye Gall, MD [address] [place],  [place] [address]  Primary Physician:  Tye Gall, MD  History of Present Illness: 11/05/2023 Brian Bean is here today with a chief complaint of back pain as well as numbness and tingling in bilateral hands.  He does have a remote history of cervical surgery. he states he was doing her yard work then had a fall 1 month ago.  After the fall and a long drive, patient's pain increased.  He states that it radiates to both of his ribs describes it as a sharp pain.  He has been wearing a back brace for several hours a day which has helped his pain.  He denies any radiation to bilateral lower extremities.  He also says for the past 6 weeks he has had increasing numbness and tingling in his first 3 digits of both hands.  Right hand worse than left.  He has noticed that he is having trouble doing the buttons on his shirt and is also prone to dropping things.  He has been going to the chiropractor as well as taking baclofen  for his pain and using different therapy modalities at the [facility].  This has improved his pain some.  Denies any weakness in bilateral lower extremities.  Denies any new changes to bowel or bladder.     Weakness: none Bowel/Bladder Dysfunction: none  Conservative measures:  Physical therapy: has not participated in PT  Multimodal medical therapy including regular antiinflammatories: Aleve, Baclofen , Tylenol  Injections: no epidural steroid injections  Past Surgery: cervical surgery 1988    The symptoms are causing a significant impact on the patient's life.   Review of Systems:  A 10 point review of systems is negative, except for the pertinent positives and negatives detailed in the HPI.  Past Medical History: Past Medical History:  Diagnosis Date   Anxiety    Arthritis    Cancer (HCC)    skin lesion scalp   CLL (chronic lymphocytic leukemia) (HCC) 01/06/2013   CLL (chronic  lymphocytic leukemia) (HCC)    Depression    Head injury    fell from a horse   Hypertension    Lymphocytosis    PONV (postoperative nausea and vomiting)    extremely sick did not get sick after hip surgery in 2013   Prostate cancer [facility])     Past Surgical History: Past Surgical History:  Procedure Laterality Date   APPENDECTOMY     BRONCHIAL BIOPSY  04/16/2021   Procedure: BRONCHIAL BIOPSIES;  Surgeon: Prudy Brownie, DO;  [facility];  Service: Pulmonary;;   BRONCHIAL BRUSHINGS  04/16/2021   Procedure: BRONCHIAL BRUSHINGS;  Surgeon: Prudy Brownie, DO;  [facility];  Service: Pulmonary;;   BRONCHIAL NEEDLE ASPIRATION BIOPSY  04/16/2021   Procedure: BRONCHIAL NEEDLE ASPIRATION BIOPSIES;  Surgeon: Prudy Brownie, DO;  [facility];  Service: Pulmonary;;   BRONCHIAL WASHINGS  04/16/2021   Procedure: BRONCHIAL WASHINGS;  Surgeon: Prudy Brownie, DO;  [facility];  Service: Pulmonary;;   CATARACT EXTRACTION, BILATERAL     CERVICAL DISC SURGERY  2000   COLONOSCOPY  2011   [facility].    FRACTURE SURGERY     rt ankle   HERNIA REPAIR     rt and  undistended testicle   PROSTATE BIOPSY     REVERSE SHOULDER ARTHROPLASTY Right 05/01/2020   Procedure: REVERSE SHOULDER ARTHROPLASTY;  Surgeon: Elner Hahn, MD;  Location: [facility] ORS;  Service:  Orthopedics;  Laterality: Right;   rt hip  2012   pinned   TOTAL HIP ARTHROPLASTY  05/03/2012   Procedure: TOTAL HIP ARTHROPLASTY;  Surgeon: Ilean Mall, MD;  [facility];  Service: Orthopedics;  Laterality: Right;   VIDEO BRONCHOSCOPY WITH ENDOBRONCHIAL NAVIGATION Right 04/16/2021   Procedure: VIDEO BRONCHOSCOPY WITH ENDOBRONCHIAL NAVIGATION;  Surgeon: Prudy Brownie, DO;  [facility];  Service: Pulmonary;  Laterality: Right;  ION w/ CIOS   VIDEO BRONCHOSCOPY WITH RADIAL ENDOBRONCHIAL ULTRASOUND  04/16/2021   Procedure: RADIAL ENDOBRONCHIAL ULTRASOUND;  Surgeon: Prudy Brownie, DO;   [facility];  Service: Pulmonary;;    Allergies: Allergies as of 11/05/2023   (No Known Allergies)    Medications: Outpatient Encounter Medications as of 11/05/2023  Medication Sig   acetaminophen  (TYLENOL ) 500 MG tablet Take 500 mg by mouth as needed.   alendronate  (FOSAMAX ) 70 MG tablet Take 1 tablet (70 mg total) by mouth once a week. Take with a full glass of water  on an empty stomach.   amLODipine  (NORVASC ) 5 MG tablet Take 2.5 mg by mouth daily.   apalutamide  (ERLEADA ) 60 MG tablet Take 4 tablets (240 mg total) by mouth daily.   Ascorbic Acid (VITAMIN C) 1000 MG tablet Take 1,000 mg by mouth daily.   aspirin  EC 81 MG tablet Take 81 mg by mouth daily. Swallow whole.   atorvastatin  (LIPITOR) 20 MG tablet Take 20 mg by mouth daily.   Baclofen  5 MG TABS Take 1 tablet (5 mg total) by mouth in the morning and at bedtime.   carboxymethylcellulose (REFRESH PLUS) 0.5 % SOLN Place 1 drop into both eyes as needed.   carvedilol  (COREG ) 12.5 MG tablet Take 12.5 mg by mouth 2 (two) times daily with a meal.    Cholecalciferol 125 MCG (5000 UT) TABS Take by mouth.   fluorouracil (EFUDEX) 5 % cream Apply 1 application  topically as needed (precancerous spots).   FLUoxetine  (PROZAC ) 40 MG capsule Take 1 capsule (40 mg total) by mouth daily.   fluticasone  (FLONASE ) 50 MCG/ACT nasal spray Place 1 spray into both nostrils as needed for allergies or rhinitis.   GEMTESA 75 MG TABS Take 1 tablet by mouth daily.   hydrochlorothiazide  (HYDRODIURIL ) 12.5 MG tablet Take 1 tablet (12.5 mg total) by mouth daily.   ipratropium (ATROVENT) 0.06 % nasal spray Place 2 sprays into both nostrils as needed for rhinitis.   ketoconazole (NIZORAL) 2 % shampoo Apply 1 application  topically daily as needed for irritation.   leuprolide , 6 Month, (ELIGARD ) 45 MG injection Inject 45 mg into the skin every 6 (six) months.   lisinopril  (ZESTRIL ) 10 MG tablet Take 1 tablet (10 mg total) by mouth daily.   Misc.  Devices (WRIST BRACE) MISC 1 Application by Does not apply route daily.   modafinil (PROVIGIL) 100 MG tablet Take 100 mg by mouth daily.   tamsulosin (FLOMAX) 0.4 MG CAPS capsule Take 0.4 mg by mouth daily.   No facility-administered encounter medications on file as of 11/05/2023.    Social History: Social History   Tobacco Use   Smoking status: Former    Current packs/day: 0.00    Average packs/day: 0.5 packs/day for 10.0 years (5.0 ttl pk-yrs)    Types: Cigarettes    Start date: 04/29/1967    Quit date: 04/28/1977    Years since quitting: 46.5   Smokeless tobacco: Never   Tobacco comments:    10 oz alcohol wkly  Vaping Use  Vaping status: Never Used  Substance Use Topics   Alcohol use: Yes    Alcohol/week: 21.0 standard drinks of alcohol    Types: 5 Glasses of wine, 16 Shots of liquor per week    Comment: 14 ounces a week   Drug use: No    Family Medical History: Family History  Problem Relation Age of Onset   Heart disease Mother    Cancer Father        unk type of cancer    Physical Examination: @VITALWITHPAIN @  General: Patient is well developed, well nourished, calm, collected, and in no apparent distress. Attention to examination is appropriate.  Psychiatric: Patient is non-anxious.  Head:  Pupils equal, round, and reactive to light.  ENT:  Oral mucosa appears well hydrated.  Neck:   Supple.  Some decreased range of motion.  Respiratory: Patient is breathing without any difficulty.  Extremities: No edema.  Vascular: Palpable dorsal pedal pulses.  Skin:   On exposed skin, there are no abnormal skin lesions.  NEUROLOGICAL:     Awake, alert, oriented to person, place, and time.  Speech is clear and fluent. Fund of knowledge is appropriate.   Cranial Nerves: Pupils equal round and reactive to light.  Facial tone is symmetric.    ROM of spine: Some decreased range of motion in his cervical spine.  Minimal tenderness to the thoracic and lumbar  spine.  Positive Spurling's Positive carpal compression test bilaterally, positive Phalen's, positive reverse Phalen's.   Strength: Side Biceps Triceps Deltoid Interossei Grip Wrist Ext. Wrist Flex.  R 5 5 5 4 5 5 5   L 5 5 5  4+ 5 5 5   APB 4   3+ biceps  Reflexes are 2+ and symmetric at triceps and brachioradialis.  3+ biceps.  Hoffman's is absent.  Clonus is not present.  Toes are down-going.  Bilateral upper and lower extremity sensation is intact to light touch with the exception of numbness in his first 3 digits of bilateral hands. Gait is normal.   No difficulty with tandem gait.   No evidence of dysmetria noted.  Medical Decision Making  Imaging: No recent imaging available to review    Assessment and Plan: Mr. Limones is a pleasant 83 y.o. male is here today with a chief complaint of back pain as well as numbness and tingling in bilateral hands.  He does have a remote history of cervical surgery. he states he was doing her yard work then had a fall 1 month ago.  After the fall and a long drive, patient's pain increased.  He states that it radiates to both of his ribs describes it as a sharp pain.  He has been wearing a back brace for several hours a day which has helped his pain.  He denies any radiation to bilateral lower extremities.  He also says for the past 6 weeks he has had increasing numbness and tingling in his first 3 digits of both hands.  Right hand worse than left.  He has noticed that he is having trouble doing the buttons on his shirt and is also prone to dropping things.  He has been going to the chiropractor as well as taking baclofen  for his pain and using different therapy modalities at the [facility].  This has improved his pain some.  Denies any weakness in bilateral lower extremities.  Denies any new changes to bowel or bladder.  On examination, positive Spurling's, positive carpal tunnel syndrome provocative maneuvers.  Some mild intrinsic  weakness.  Pleasure to  see patient in clinic today.  Concern for fracture due to falls that could be contributing to his bilateral radiating rib pain.  Would like him to undergo x-rays today for further evaluation.  In addition, would like patient to undergo EMG for carpal tunnel syndrome versus cervical radiculopathy.  He does have positive provocative maneuvers indicating carpal tunnel syndrome however does have positive Spurling's and with previous surgery concern for possible radiculopathy.  Pending x-ray results, would consider MRI in the future.  Will review results once complete and discuss further with the patient.  He was encouraged reach out to me for any questions or concerns anytime.    Thank you for involving me in the care of this patient.   I spent a total of 45 minutes in both face-to-face and non-face-to-face activities for this visit on the date of this encounter including pain to the patient, obtaining and reviewing separately history and examination, counseling the patient, ordering additional tests, documenting clinical information.  Ludwig Safer, PA-C Dept. of Neurosurgery

## 2023-11-05 ENCOUNTER — Other Ambulatory Visit: Payer: Self-pay | Admitting: Physician Assistant

## 2023-11-05 ENCOUNTER — Ambulatory Visit
Admission: RE | Admit: 2023-11-05 | Discharge: 2023-11-05 | Disposition: A | Discharge status: Home / Self Care | Source: Ambulatory Visit | Attending: Physician Assistant | Admitting: Physician Assistant

## 2023-11-05 ENCOUNTER — Ambulatory Visit
Admission: RE | Admit: 2023-11-05 | Discharge: 2023-11-05 | Disposition: A | Discharge status: Home / Self Care | Attending: Physician Assistant | Admitting: Physician Assistant

## 2023-11-05 ENCOUNTER — Ambulatory Visit: Admitting: Physician Assistant

## 2023-11-05 VITALS — BP 136/68 | Ht 72.0 in | Wt 210.0 lb

## 2023-11-05 DIAGNOSIS — G5603 Carpal tunnel syndrome, bilateral upper limbs: Secondary | ICD-10-CM | POA: Diagnosis not present

## 2023-11-05 DIAGNOSIS — M542 Cervicalgia: Secondary | ICD-10-CM | POA: Diagnosis not present

## 2023-11-05 DIAGNOSIS — M546 Pain in thoracic spine: Secondary | ICD-10-CM

## 2023-11-05 DIAGNOSIS — R200 Anesthesia of skin: Secondary | ICD-10-CM

## 2023-11-05 DIAGNOSIS — M419 Scoliosis, unspecified: Secondary | ICD-10-CM | POA: Insufficient documentation

## 2023-11-05 DIAGNOSIS — M545 Low back pain, unspecified: Secondary | ICD-10-CM

## 2023-11-05 DIAGNOSIS — W19XXXA Unspecified fall, initial encounter: Secondary | ICD-10-CM

## 2023-11-05 DIAGNOSIS — M4322 Fusion of spine, cervical region: Secondary | ICD-10-CM | POA: Diagnosis not present

## 2023-11-05 DIAGNOSIS — M4317 Spondylolisthesis, lumbosacral region: Secondary | ICD-10-CM | POA: Insufficient documentation

## 2023-11-05 DIAGNOSIS — Z981 Arthrodesis status: Secondary | ICD-10-CM | POA: Diagnosis not present

## 2023-11-05 DIAGNOSIS — R29898 Other symptoms and signs involving the musculoskeletal system: Secondary | ICD-10-CM

## 2023-11-05 DIAGNOSIS — M4319 Spondylolisthesis, multiple sites in spine: Secondary | ICD-10-CM | POA: Diagnosis not present

## 2023-11-05 DIAGNOSIS — M4135 Thoracogenic scoliosis, thoracolumbar region: Secondary | ICD-10-CM | POA: Diagnosis not present

## 2023-11-11 ENCOUNTER — Other Ambulatory Visit: Payer: Self-pay | Admitting: Pharmacist

## 2023-11-11 ENCOUNTER — Encounter: Payer: Self-pay | Admitting: Neurology

## 2023-11-11 DIAGNOSIS — C61 Malignant neoplasm of prostate: Secondary | ICD-10-CM

## 2023-11-11 MED ORDER — APALUTAMIDE 60 MG PO TABS: 240.0000 mg | ORAL_TABLET | Freq: Every day | ORAL | 6 refills | Status: DC

## 2023-11-17 ENCOUNTER — Ambulatory Visit: Payer: Self-pay | Admitting: Physician Assistant

## 2023-11-18 ENCOUNTER — Other Ambulatory Visit: Payer: Self-pay | Admitting: Physician Assistant

## 2023-11-18 DIAGNOSIS — M546 Pain in thoracic spine: Secondary | ICD-10-CM

## 2023-11-19 ENCOUNTER — Encounter: Payer: Self-pay | Admitting: Sports Medicine

## 2023-11-20 ENCOUNTER — Other Ambulatory Visit: Payer: Self-pay | Admitting: Sports Medicine

## 2023-11-20 DIAGNOSIS — M546 Pain in thoracic spine: Secondary | ICD-10-CM

## 2023-11-21 ENCOUNTER — Ambulatory Visit
Admission: RE | Admit: 2023-11-21 | Discharge: 2023-11-21 | Disposition: A | Discharge status: Home / Self Care | Source: Ambulatory Visit | Attending: Physician Assistant | Admitting: Physician Assistant

## 2023-11-21 DIAGNOSIS — M546 Pain in thoracic spine: Secondary | ICD-10-CM | POA: Diagnosis not present

## 2023-11-21 DIAGNOSIS — R202 Paresthesia of skin: Secondary | ICD-10-CM | POA: Diagnosis not present

## 2023-11-23 ENCOUNTER — Ambulatory Visit: Admitting: Neurology

## 2023-11-23 DIAGNOSIS — M79641 Pain in right hand: Secondary | ICD-10-CM | POA: Diagnosis not present

## 2023-11-23 DIAGNOSIS — G5603 Carpal tunnel syndrome, bilateral upper limbs: Secondary | ICD-10-CM

## 2023-11-23 DIAGNOSIS — M79642 Pain in left hand: Secondary | ICD-10-CM | POA: Diagnosis not present

## 2023-11-23 NOTE — Procedures (Signed)
 [facility]  [address] [place], [address]  [place], [place] [address] Tel: [phone] Fax: [phone] Test Date:  11/23/2023  Patient: Brian Bean DOB: [birth year] Physician: Rommie Coats, MD  Sex: Male Height: 6' 0 Ref Phys: Ludwig Safer, Kirby Peoples  ID#: [id number]   Technician:    History: This is an 83 year old male with numbness and tingling in both hands.  NCV & EMG Findings: Extensive electrodiagnostic evaluation of bilateral upper limbs shows: Right median sensory response is absent. Left median sensory response shows prolonged distal peak latency (3.9 ms). Bilateral ulnar and radial sensory responses are within normal limits. Bilateral median (APB) and ulnar (ADM) motor responses are within normal limits. Chronic motor axon loss changes without accompanying active denervation changes are seen in the left triceps muscle. All other tested muscles are within normal limits. Cervical paraspinal muscles were not evaluated due to prior cervical spine surgery.  Impression: This is an abnormal study. The findings are most consistent with the following: Evidence of bilateral median mononeuropathy at or distal to the wrist, consistent with carpal tunnel syndrome. The findings are severe on the right and mild on the left. No definitive electrodiagnostic evidence of right or left cervical (C5-C8) motor radiculopathy. Chronic neurogenic changes isolated to the left triceps muscle are seen on needle examination, with no active or ongoing changes, that is of unclear significance. This finding is too limited in degree and distribution for diagnostic purposes.     ___________________________ Rommie Coats, MD    Nerve Conduction Studies Motor Nerve Results    Latency Amplitude F-Lat Segment Distance CV Comment  Site (ms) Norm (mV) Norm (ms)  (cm) (m/s) Norm   Left Median (APB) Motor  Wrist 2.9  < 4.0 6.0  > 5.0        Elbow 8.4 - 5.7 -  Elbow-Wrist 30 55  > 50   Right  Median (APB) Motor  Wrist 3.0  < 4.0 6.4  > 5.0        Elbow 9.1 - 6.0 -  Elbow-Wrist 30.5 50  > 50   Left Ulnar (ADM) Motor  Wrist 1.78  < 3.1 7.0  > 7.0        Bel elbow 6.0 - 7.0 -  Bel elbow-Wrist 23 55  > 50   Ab elbow 7.9 - 6.7 -  Ab elbow-Bel elbow 10 53 -   Right Ulnar (ADM) Motor  Wrist 1.90  < 3.1 7.4  > 7.0        Bel elbow 6.0 - 6.6 -  Bel elbow-Wrist 23 56  > 50   Ab elbow 7.7 - 6.2 -  Ab elbow-Bel elbow 10 59 -    Sensory Sites    Neg Peak Lat Amplitude (O-P) Segment Distance Velocity Comment  Site (ms) Norm (V) Norm  (cm) (ms)   Left Median Sensory  Wrist-Dig II *3.9  < 3.8 11  > 10 Wrist-Dig II 13    Right Median Sensory  Wrist-Dig II *NR  < 3.8 *NR  > 10 Wrist-Dig II 13    Left Radial Sensory  Forearm-Wrist 1.65  < 2.8 12  > 10 Forearm-Wrist 10    Right Radial Sensory  Forearm-Wrist 2.5  < 2.8 13  > 10 Forearm-Wrist 10    Left Ulnar Sensory  Wrist-Dig V 2.9  < 3.2 5  > 5 Wrist-Dig V 11    Right Ulnar Sensory  Wrist-Dig V 2.6  < 3.2 8  > 5  Wrist-Dig V 11     Electromyography   Side Muscle Ins.Act Fibs Fasc Recrt Amp Dur Poly Activation Comment  Right FDI Nml Nml Nml Nml Nml Nml Nml Nml N/A  Right EIP Nml Nml Nml Nml Nml Nml Nml Nml N/A  Right Pronator teres Nml Nml Nml Nml Nml Nml Nml Nml N/A  Right Biceps Nml Nml Nml Nml Nml Nml Nml Nml N/A  Right Triceps Nml Nml Nml Nml Nml Nml Nml Nml N/A  Right Deltoid Nml Nml Nml Nml Nml Nml Nml Nml N/A  Left FDI Nml Nml Nml Nml Nml Nml Nml Nml N/A  Left EIP Nml Nml Nml Nml Nml Nml Nml Nml N/A  Left Pronator teres Nml Nml Nml Nml Nml Nml Nml Nml N/A  Left Biceps Nml Nml Nml Nml Nml Nml Nml Nml N/A  Left Triceps Nml Nml Nml *2- *1+ *1+ Nml Nml N/A  Left Deltoid Nml Nml Nml Nml Nml Nml Nml Nml N/A      Waveforms:  Motor           Sensory

## 2023-11-30 ENCOUNTER — Ambulatory Visit: Admitting: Physician Assistant

## 2023-11-30 DIAGNOSIS — G5603 Carpal tunnel syndrome, bilateral upper limbs: Secondary | ICD-10-CM | POA: Diagnosis not present

## 2023-11-30 DIAGNOSIS — M5124 Other intervertebral disc displacement, thoracic region: Secondary | ICD-10-CM

## 2023-11-30 NOTE — Progress Notes (Unsigned)
 Spoke with patient over the phone regarding recent EMG and MRI of his thoracic spine.  EMG does show bilateral carpal tunnel syndrome.  Severe on the right and mild on the left.  There is also some chronic neurogenic changes seen in the left tricep without any ongoing changes.  MRI of his thoracic spine was reviewed with Dr. Claudene.  Does show a small disc protrusion at T8-9 with some facet arthropathy.  He continues to experience significant back and rib pain.  He does have a history of rib fractures in the past.  He has been using patches, but the majority of his shooting pain only happens when he is turning a certain way.  Muscle laxer and Tylenol  has helped with the pain some.  We did discuss possible additional x-rays to rule out any rib fractures for potential injections for his thoracic spine, but he would like to talk to Dr. Lafe further about this.  We also discussed the carpal tunnel syndrome in his hands.  He continues to have significant numbness and tingling in his hand he does not feel as though this has been helped by wearing a brace.  He continues to have issues with fine motor skills as well which is troubling to him.  At this point time patient was noted commit to carpal tunnel surgery will like to discuss his back further with Dr. Claudene.  Will coordinate this for him in the future.  He was encouraged to reach out to us  in the meantime for questions or concerns that he has in the future.   I connected with  Zachary Riddles on 11/30/23 via telephone and verified that I am speaking with the correct person using two identifiers. He was at his private residence, I was in clinic. We spoke for 20 minutes   I discussed the limitations of evaluation and management by telemedicine. The patient expressed understanding and agreed to proceed.     EXAM: MR Thoracic Spine without 11/21/2023 05:44:00 PM   TECHNIQUE: Multiplanar multisequence MRI of the thoracic spine was performed without  the administration of intravenous contrast.   COMPARISON: None available   CLINICAL HISTORY: Myelopathy, chronic, thoracic spine; Trunk numbness or tingling. Chronic upper back pain, prog worse.; Multiple falls over the years w/rib fx; States bilat hand/finger numbness.   FINDINGS:   BONES AND ALIGNMENT: Exaggerated kyphosis of the mid and upper thoracic spine. Chronic-appearing mild anterior height loss of the T4, T5, and T9 vertebral bodies. Marrow edema in the anterior, inferior corner of the T7 vertebral body is favored degenerative.   SPINAL CORD: Normal spinal cord signal.   SOFT TISSUES: Unremarkable.   DEGENERATIVE CHANGES: Central disc protrusion at T8-9 with mild deformity of the ventral cord. Lower thoracic facet arthropathy, greatest at T9-10 and T10-11 without significant neural foraminal narrowing.   IMPRESSION: 1. Central disc protrusion at T8-9 with mild deformity of the ventral cord. 2. Lower thoracic facet arthropathy, greatest at T9-10 and T10-11, without significant neural foraminal narrowing.  [facility]  [address] [place], [address]  [place], [place] [address] Tel: [phone] Fax: [phone] Test Date:  11/23/2023   Patient: Dyllon Henken DOB: [birth year] Physician: Venetia Potters, MD  Sex: Male Height: 6' 0 Ref Phys: Lyle Decamp, DEVONNA  ID#: [id number]     Technician:      History: This is an 83 year old male with numbness and tingling in both hands.   NCV & EMG Findings: Extensive electrodiagnostic evaluation of bilateral upper limbs  shows: Right median sensory response is absent. Left median sensory response shows prolonged distal peak latency (3.9 ms). Bilateral ulnar and radial sensory responses are within normal limits. Bilateral median (APB) and ulnar (ADM) motor responses are within normal limits. Chronic motor axon loss changes without accompanying active denervation changes are seen in the left triceps muscle. All other  tested muscles are within normal limits. Cervical paraspinal muscles were not evaluated due to prior cervical spine surgery.   Impression: This is an abnormal study. The findings are most consistent with the following: Evidence of bilateral median mononeuropathy at or distal to the wrist, consistent with carpal tunnel syndrome. The findings are severe on the right and mild on the left. No definitive electrodiagnostic evidence of right or left cervical (C5-C8) motor radiculopathy. Chronic neurogenic changes isolated to the left triceps muscle are seen on needle examination, with no active or ongoing changes, that is of unclear significance. This finding is too limited in degree and distribution for diagnostic purposes.         ___________________________ Venetia Potters, MD     Nerve Conduction Studies Motor Nerve Results                 Latency Amplitude F-Lat Segment Distance CV Comment  Site (ms) Norm (mV) Norm (ms)   (cm) (m/s) Norm    Left Median (APB) Motor  Wrist 2.9  < 4.0 6.0  > 5.0              Elbow 8.4 - 5.7 -   Elbow-Wrist 30 55  > 50    Right Median (APB) Motor  Wrist 3.0  < 4.0 6.4  > 5.0              Elbow 9.1 - 6.0 -   Elbow-Wrist 30.5 50  > 50    Left Ulnar (ADM) Motor  Wrist 1.78  < 3.1 7.0  > 7.0              Bel elbow 6.0 - 7.0 -   Bel elbow-Wrist 23 55  > 50    Ab elbow 7.9 - 6.7 -   Ab elbow-Bel elbow 10 53 -    Right Ulnar (ADM) Motor  Wrist 1.90  < 3.1 7.4  > 7.0              Bel elbow 6.0 - 6.6 -   Bel elbow-Wrist 23 56  > 50    Ab elbow 7.7 - 6.2 -   Ab elbow-Bel elbow 10 59 -      Sensory Sites               Neg Peak Lat Amplitude (O-P) Segment Distance Velocity Comment  Site (ms) Norm (V) Norm   (cm) (ms)    Left Median Sensory  Wrist-Dig II *3.9  < 3.8 11  > 10 Wrist-Dig II 13      Right Median Sensory  Wrist-Dig II *NR  < 3.8 *NR  > 10 Wrist-Dig II 13      Left Radial Sensory  Forearm-Wrist 1.65  < 2.8 12  > 10 Forearm-Wrist 10      Right  Radial Sensory  Forearm-Wrist 2.5  < 2.8 13  > 10 Forearm-Wrist 10      Left Ulnar Sensory  Wrist-Dig V 2.9  < 3.2 5  > 5 Wrist-Dig V 11      Right Ulnar Sensory  Wrist-Dig V 2.6  < 3.2 8  >  5 Wrist-Dig V 11        Electromyography    Side Muscle Ins.Act Fibs Fasc Recrt Amp Dur Poly Activation Comment  Right FDI Nml Nml Nml Nml Nml Nml Nml Nml N/A  Right EIP Nml Nml Nml Nml Nml Nml Nml Nml N/A  Right Pronator teres Nml Nml Nml Nml Nml Nml Nml Nml N/A  Right Biceps Nml Nml Nml Nml Nml Nml Nml Nml N/A  Right Triceps Nml Nml Nml Nml Nml Nml Nml Nml N/A  Right Deltoid Nml Nml Nml Nml Nml Nml Nml Nml N/A  Left FDI Nml Nml Nml Nml Nml Nml Nml Nml N/A  Left EIP Nml Nml Nml Nml Nml Nml Nml Nml N/A  Left Pronator teres Nml Nml Nml Nml Nml Nml Nml Nml N/A  Left Biceps Nml Nml Nml Nml Nml Nml Nml Nml N/A  Left Triceps Nml Nml Nml *2- *1+ *1+ Nml Nml N/A  Left Deltoid Nml Nml Nml Nml Nml Nml Nml Nml N/A

## 2023-12-01 ENCOUNTER — Encounter: Payer: Self-pay | Admitting: Sports Medicine

## 2023-12-02 NOTE — Telephone Encounter (Signed)
 Noted

## 2023-12-02 NOTE — Telephone Encounter (Addendum)
 Donis Ozell SAUNDERS to Me  Nicholaus Darice DELENA Dry, Alondra (Selected Message) MB    12/02/23  2:08 PM Patient rescheduled appointment with E2C2.

## 2023-12-02 NOTE — Telephone Encounter (Signed)
 Message routed to [employer].

## 2023-12-03 DIAGNOSIS — G5603 Carpal tunnel syndrome, bilateral upper limbs: Secondary | ICD-10-CM | POA: Diagnosis not present

## 2023-12-03 DIAGNOSIS — M5124 Other intervertebral disc displacement, thoracic region: Secondary | ICD-10-CM | POA: Diagnosis not present

## 2023-12-03 DIAGNOSIS — Z8781 Personal history of (healed) traumatic fracture: Secondary | ICD-10-CM | POA: Diagnosis not present

## 2023-12-03 DIAGNOSIS — G8929 Other chronic pain: Secondary | ICD-10-CM | POA: Diagnosis not present

## 2023-12-03 DIAGNOSIS — M545 Low back pain, unspecified: Secondary | ICD-10-CM | POA: Diagnosis not present

## 2023-12-07 DIAGNOSIS — M546 Pain in thoracic spine: Secondary | ICD-10-CM | POA: Diagnosis not present

## 2023-12-13 ENCOUNTER — Encounter: Payer: Self-pay | Admitting: Oncology

## 2023-12-14 ENCOUNTER — Encounter: Admitting: Neurology

## 2023-12-15 ENCOUNTER — Inpatient Hospital Stay

## 2023-12-15 ENCOUNTER — Inpatient Hospital Stay: Attending: Oncology

## 2023-12-15 DIAGNOSIS — C61 Malignant neoplasm of prostate: Secondary | ICD-10-CM | POA: Diagnosis present

## 2023-12-15 DIAGNOSIS — Z79899 Other long term (current) drug therapy: Secondary | ICD-10-CM | POA: Insufficient documentation

## 2023-12-15 DIAGNOSIS — C7802 Secondary malignant neoplasm of left lung: Secondary | ICD-10-CM | POA: Insufficient documentation

## 2023-12-15 DIAGNOSIS — C7801 Secondary malignant neoplasm of right lung: Secondary | ICD-10-CM | POA: Diagnosis not present

## 2023-12-15 DIAGNOSIS — C9110 Chronic lymphocytic leukemia of B-cell type not having achieved remission: Secondary | ICD-10-CM | POA: Diagnosis not present

## 2023-12-15 DIAGNOSIS — C3431 Malignant neoplasm of lower lobe, right bronchus or lung: Secondary | ICD-10-CM | POA: Diagnosis not present

## 2023-12-15 LAB — CBC WITH DIFFERENTIAL (CANCER CENTER ONLY)
Abs Immature Granulocytes: 0.04 K/uL (ref 0.00–0.07)
Basophils Absolute: 0.1 K/uL (ref 0.0–0.1)
Basophils Relative: 1 %
Eosinophils Absolute: 0.1 K/uL (ref 0.0–0.5)
Eosinophils Relative: 1 %
HCT: 39.1 % (ref 39.0–52.0)
Hemoglobin: 13 g/dL (ref 13.0–17.0)
Immature Granulocytes: 0 %
Lymphocytes Relative: 52 %
Lymphs Abs: 5.7 K/uL — ABNORMAL HIGH (ref 0.7–4.0)
MCH: 30.8 pg (ref 26.0–34.0)
MCHC: 33.2 g/dL (ref 30.0–36.0)
MCV: 92.7 fL (ref 80.0–100.0)
Monocytes Absolute: 0.6 K/uL (ref 0.1–1.0)
Monocytes Relative: 6 %
Neutro Abs: 4.2 K/uL (ref 1.7–7.7)
Neutrophils Relative %: 40 %
Platelet Count: 144 K/uL — ABNORMAL LOW (ref 150–400)
RBC: 4.22 MIL/uL (ref 4.22–5.81)
RDW: 14.3 % (ref 11.5–15.5)
Smear Review: NORMAL
WBC Count: 10.7 K/uL — ABNORMAL HIGH (ref 4.0–10.5)
nRBC: 0 % (ref 0.0–0.2)

## 2023-12-15 LAB — CMP (CANCER CENTER ONLY)
ALT: 15 U/L (ref 0–44)
AST: 16 U/L (ref 15–41)
Albumin: 3.9 g/dL (ref 3.5–5.0)
Alkaline Phosphatase: 41 U/L (ref 38–126)
Anion gap: 9 (ref 5–15)
BUN: 24 mg/dL — ABNORMAL HIGH (ref 8–23)
CO2: 25 mmol/L (ref 22–32)
Calcium: 9 mg/dL (ref 8.9–10.3)
Chloride: 107 mmol/L (ref 98–111)
Creatinine: 0.68 mg/dL (ref 0.61–1.24)
GFR, Estimated: >60 mL/min (ref >60)
Glucose, Bld: 101 mg/dL — ABNORMAL HIGH (ref 70–99)
Potassium: 3.9 mmol/L (ref 3.5–5.1)
Sodium: 141 mmol/L (ref 135–145)
Total Bilirubin: 0.4 mg/dL (ref 0.0–1.2)
Total Protein: 6.5 g/dL (ref 6.5–8.1)

## 2023-12-15 LAB — PSA: Prostatic Specific Antigen: 0.01 ng/mL (ref 0.00–4.00)

## 2023-12-16 ENCOUNTER — Ambulatory Visit: Admitting: Sports Medicine

## 2023-12-17 ENCOUNTER — Telehealth: Payer: Self-pay

## 2023-12-17 NOTE — Telephone Encounter (Signed)
 Called Patient x1 to schedule New Patient Referral. LVM to call back.

## 2023-12-21 ENCOUNTER — Ambulatory Visit: Admitting: Physician Assistant

## 2023-12-21 ENCOUNTER — Ambulatory Visit: Admitting: Neurosurgery

## 2023-12-21 ENCOUNTER — Encounter: Payer: Self-pay | Admitting: Neurosurgery

## 2023-12-21 VITALS — BP 142/88 | Ht 72.0 in | Wt 210.0 lb

## 2023-12-21 DIAGNOSIS — M546 Pain in thoracic spine: Secondary | ICD-10-CM | POA: Diagnosis not present

## 2023-12-21 DIAGNOSIS — G5603 Carpal tunnel syndrome, bilateral upper limbs: Secondary | ICD-10-CM | POA: Diagnosis not present

## 2023-12-21 DIAGNOSIS — R0781 Pleurodynia: Secondary | ICD-10-CM | POA: Insufficient documentation

## 2023-12-21 NOTE — Progress Notes (Signed)
 Referring Physician:  No referring provider defined for this encounter.  Primary Physician:  Sherlynn Madden, MD  History of Present Illness: 12/21/2023  Is here today to follow-up on an EMG and nerve conduction study as well as discuss his right sided rib/flank pain.  In regards to his carpal tunnel syndrome, he has had a recent evaluation was found to have carpal tunnel syndrome right was severe left was mild.  He states that his right hand is much more symptomatic.  He has not gotten any better.  He does wear a brace.  He has not noticed any progressive worsening but has not noticed any improvements.  He is interested in getting a injection.  He also complains of right sided rib/flank pain.  He states it is mostly at a pinpoint area on the right side in the line with the axilla, this approximately halfway down his flank from his armpit to his greater trochanter region.  He states that this does not wrap all the way down to his midline.  He mostly is in the point location.  Weakness: none Bowel/Bladder Dysfunction: none  Conservative measures:  Physical therapy: has not participated in PT  Multimodal medical therapy including regular antiinflammatories: Aleve, Baclofen , Tylenol  Injections: no epidural steroid injections  Past Surgery: cervical surgery 1988  The symptoms are causing a significant impact on the patient's life.   Review of Systems:  A 10 point review of systems is negative, except for the pertinent positives and negatives detailed in the HPI.  Past Medical History: Past Medical History:  Diagnosis Date   Anxiety    Arthritis    Cancer (HCC)    skin lesion scalp   CLL (chronic lymphocytic leukemia) (HCC) 01/06/2013   CLL (chronic lymphocytic leukemia) (HCC)    Depression    Head injury    fell from a horse   Hypertension    Lymphocytosis    PONV (postoperative nausea and vomiting)    extremely sick did not get sick after hip surgery in 2013    Prostate cancer [facility])     Past Surgical History: Past Surgical History:  Procedure Laterality Date   APPENDECTOMY     BRONCHIAL BIOPSY  04/16/2021   Procedure: BRONCHIAL BIOPSIES;  Surgeon: Brenna Adine CROME, DO;  Location: [facility];  Service: Pulmonary;;   BRONCHIAL BRUSHINGS  04/16/2021   Procedure: BRONCHIAL BRUSHINGS;  Surgeon: Brenna Adine CROME, DO;  Location: [facility];  Service: Pulmonary;;   BRONCHIAL NEEDLE ASPIRATION BIOPSY  04/16/2021   Procedure: BRONCHIAL NEEDLE ASPIRATION BIOPSIES;  Surgeon: Brenna Adine CROME, DO;  Location: [facility];  Service: Pulmonary;;   BRONCHIAL WASHINGS  04/16/2021   Procedure: BRONCHIAL WASHINGS;  Surgeon: Brenna Adine CROME, DO;  Location: [facility];  Service: Pulmonary;;   CATARACT EXTRACTION, BILATERAL     CERVICAL DISC SURGERY  2000   COLONOSCOPY  2011   [facility].    FRACTURE SURGERY     rt ankle   HERNIA REPAIR     rt and  undistended testicle   PROSTATE BIOPSY     REVERSE SHOULDER ARTHROPLASTY Right 05/01/2020   Procedure: REVERSE SHOULDER ARTHROPLASTY;  Surgeon: Edie Norleen PARAS, MD;  Location: [facility];  Service: Orthopedics;  Laterality: Right;   rt hip  2012   pinned   TOTAL HIP ARTHROPLASTY  05/03/2012   Procedure: TOTAL HIP ARTHROPLASTY;  Surgeon: Dempsey PARAS Sensor, MD;  Location: [facility];  Service: Orthopedics;  Laterality: Right;   VIDEO BRONCHOSCOPY WITH ENDOBRONCHIAL NAVIGATION Right  04/16/2021   Procedure: VIDEO BRONCHOSCOPY WITH ENDOBRONCHIAL NAVIGATION;  Surgeon: Brenna Adine CROME, DO;  Location: [facility];  Service: Pulmonary;  Laterality: Right;  ION w/ CIOS   VIDEO BRONCHOSCOPY WITH RADIAL ENDOBRONCHIAL ULTRASOUND  04/16/2021   Procedure: RADIAL ENDOBRONCHIAL ULTRASOUND;  Surgeon: Brenna Adine CROME, DO;  Location: [facility];  Service: Pulmonary;;    Allergies: Allergies as of 12/21/2023   (No Known Allergies)    Medications: Outpatient Encounter Medications as of 12/21/2023  Medication Sig   acetaminophen   (TYLENOL ) 500 MG tablet Take 500 mg by mouth as needed.   amLODipine  (NORVASC ) 5 MG tablet Take 2.5 mg by mouth daily.   apalutamide  (ERLEADA ) 60 MG tablet Take 4 tablets (240 mg total) by mouth daily.   Ascorbic Acid (VITAMIN C) 1000 MG tablet Take 1,000 mg by mouth daily.   aspirin  EC 81 MG tablet Take 81 mg by mouth daily. Swallow whole.   atorvastatin  (LIPITOR) 20 MG tablet Take 20 mg by mouth daily.   carboxymethylcellulose (REFRESH PLUS) 0.5 % SOLN Place 1 drop into both eyes as needed.   carvedilol  (COREG ) 12.5 MG tablet Take 12.5 mg by mouth 2 (two) times daily with a meal.    Cholecalciferol 125 MCG (5000 UT) TABS Take by mouth.   fluorouracil (EFUDEX) 5 % cream Apply 1 application  topically as needed (precancerous spots).   FLUoxetine  (PROZAC ) 40 MG capsule Take 1 capsule (40 mg total) by mouth daily.   fluticasone  (FLONASE ) 50 MCG/ACT nasal spray Place 1 spray into both nostrils as needed for allergies or rhinitis.   GEMTESA 75 MG TABS Take 1 tablet by mouth daily.   hydrochlorothiazide  (HYDRODIURIL ) 12.5 MG tablet Take 1 tablet (12.5 mg total) by mouth daily.   ketoconazole (NIZORAL) 2 % shampoo Apply 1 application  topically daily as needed for irritation.   leuprolide , 6 Month, (ELIGARD ) 45 MG injection Inject 45 mg into the skin every 6 (six) months.   lisinopril  (ZESTRIL ) 10 MG tablet Take 1 tablet (10 mg total) by mouth daily.   Misc. Devices (WRIST BRACE) MISC 1 Application by Does not apply route daily.   modafinil (PROVIGIL) 100 MG tablet Take 100 mg by mouth daily.   tamsulosin (FLOMAX) 0.4 MG CAPS capsule Take 0.4 mg by mouth daily.   alendronate  (FOSAMAX ) 70 MG tablet Take 1 tablet (70 mg total) by mouth once a week. Take with a full glass of water  on an empty stomach.   Baclofen  5 MG TABS TAKE ONE TABLET BY MOUTH TWICE A DAY (IN THE MORNING AND AT BEDTIME)   ipratropium (ATROVENT) 0.06 % nasal spray Place 2 sprays into both nostrils as needed for rhinitis.   No  facility-administered encounter medications on file as of 12/21/2023.    Social History: Social History   Tobacco Use   Smoking status: Former    Current packs/day: 0.00    Average packs/day: 0.5 packs/day for 10.0 years (5.0 ttl pk-yrs)    Types: Cigarettes    Start date: 04/29/1967    Quit date: 04/28/1977    Years since quitting: 46.6   Smokeless tobacco: Never   Tobacco comments:    10 oz alcohol wkly  Vaping Use   Vaping status: Never Used  Substance Use Topics   Alcohol use: Yes    Alcohol/week: 21.0 standard drinks of alcohol    Types: 5 Glasses of wine, 16 Shots of liquor per week    Comment: 14 ounces a week   Drug use: No  Family Medical History: Family History  Problem Relation Age of Onset   Heart disease Mother    Cancer Father        unk type of cancer    Physical Examination:  NEUROLOGICAL:     Awake, alert, oriented to person, place, and time.  Speech is clear and fluent. Fund of knowledge is appropriate.   Cranial Nerves: Pupils equal round and reactive to light.  Facial tone is symmetric.    ROM of spine: Some decreased range of motion in his cervical spine.  Minimal tenderness to the thoracic and lumbar spine.  Positive carpal compression test bilaterally, positive Phalen's, positive reverse Phalen's.   Strength: Side Biceps Triceps Deltoid Interossei Grip Wrist Ext. Wrist Flex.  R 5 5 5 4 5 5 5   L 5 5 5  4+ 5 5 5    Medical Decision Making  Imaging: Narrative & Impression  EXAM: MR Thoracic Spine without 11/21/2023 05:44:00 PM   TECHNIQUE: Multiplanar multisequence MRI of the thoracic spine was performed without the administration of intravenous contrast.   COMPARISON: None available   CLINICAL HISTORY: Myelopathy, chronic, thoracic spine; Trunk numbness or tingling. Chronic upper back pain, prog worse.; Multiple falls over the years w/rib fx; States bilat hand/finger numbness.   FINDINGS:   BONES AND  ALIGNMENT: Exaggerated kyphosis of the mid and upper thoracic spine. Chronic-appearing mild anterior height loss of the T4, T5, and T9 vertebral bodies. Marrow edema in the anterior, inferior corner of the T7 vertebral body is favored degenerative.   SPINAL CORD: Normal spinal cord signal.   SOFT TISSUES: Unremarkable.   DEGENERATIVE CHANGES: Central disc protrusion at T8-9 with mild deformity of the ventral cord. Lower thoracic facet arthropathy, greatest at T9-10 and T10-11 without significant neural foraminal narrowing.   IMPRESSION: 1. Central disc protrusion at T8-9 with mild deformity of the ventral cord. 2. Lower thoracic facet arthropathy, greatest at T9-10 and T10-11, without significant neural foraminal narrowing.   Electronically signed by: Ryan Chess MD 11/27/2023 11:10 AM EDT RP Workstation: [id number]    [facility]  [address] [place], [address]  [place], [place] [address] Tel: [phone] Fax: [phone] Test Date:  11/23/2023   Patient: Brian Bean DOB: [birth year] Physician: Venetia Potters, MD  Sex: Male Height: 6' 0 Ref Phys: Lyle Decamp, DEVONNA  ID#: [id number]     Technician:      History: This is an 83 year old male with numbness and tingling in both hands.   NCV & EMG Findings: Extensive electrodiagnostic evaluation of bilateral upper limbs shows: Right median sensory response is absent. Left median sensory response shows prolonged distal peak latency (3.9 ms). Bilateral ulnar and radial sensory responses are within normal limits. Bilateral median (APB) and ulnar (ADM) motor responses are within normal limits. Chronic motor axon loss changes without accompanying active denervation changes are seen in the left triceps muscle. All other tested muscles are within normal limits. Cervical paraspinal muscles were not evaluated due to prior cervical spine surgery.   Impression: This is an abnormal study. The findings are most consistent  with the following: Evidence of bilateral median mononeuropathy at or distal to the wrist, consistent with carpal tunnel syndrome. The findings are severe on the right and mild on the left. No definitive electrodiagnostic evidence of right or left cervical (C5-C8) motor radiculopathy. Chronic neurogenic changes isolated to the left triceps muscle are seen on needle examination, with no active or ongoing changes, that is of unclear significance. This finding  is too limited in degree and distribution for diagnostic purposes.         ___________________________ Venetia Potters, MD   Outside record review I reviewed Dr. Orlando note from his encounter on 12/03/2023.  States that he does not feel that surgery is indicated for his low back issues.  Does not feel that the T8-9 disc protrusion is causing enough compression of the cord to warrant further surgical or aggressive intervention.  Wants to consider  interventional management for carpal tunnel syndrome bilaterally.   Assessment and Plan: Mr. Dacy is a pleasant 83 y.o. male is here today with a chief complaint of back pain as well as numbness and tingling in bilateral hands.   In regards to his carpal tunnel syndrome.  He has had a significant workup including a nerve conduction EMG study which demonstrated severe carpal tunnel on the right and mild carpal tunnel on the left.  He does continue to have symptoms.  He is wearing a brace.  He would like to avoid surgery at this time.  We did discuss that if he continues to worsen that our goal for surgery would be preservation at that time rather than reversal to the present time.  I did discuss that many patients with severe carpal tunnel syndrome do not find lasting success with injection therapy and that the definitive treatment is decompression.  He would like to go forward with injections first and states understanding about the stated risks above.  In regards to his right sided rib/flank pain.   This seems to be localized to a single area immediately underneath his axillary line, halfway between his axilla and location of the greater trochanter.  He states that he has had previous rib fractures.  This does not wrap all the way down and around to his midline.  Sometimes can hurt if he presses on it.  He does have some evidence of T8-9 small disc herniation but he does not have signs or symptoms consistent with myelopathy.  Review of his MRI did show some multilevel perineural cysts which are often incidental findings, but some of which could correlate with his symptoms.  However the largest of the cysts is on the contralateral side to his pain.  He will be seeing our pain team shortly, I will send a message to see whether or not they would discuss a possible thoracic paraspinal versus intercostal block to see whether or not he gets some pain relief in addition to his bilateral carpal tunnel syndrome.  He would like to follow-up after that time.  Penne MICAEL Sharps, MD  dept. of Neurosurgery

## 2023-12-22 ENCOUNTER — Ambulatory Visit: Admitting: Sports Medicine

## 2023-12-22 ENCOUNTER — Encounter: Payer: Self-pay | Admitting: Sports Medicine

## 2023-12-22 VITALS — BP 118/72 | HR 83 | Temp 97.1°F | Ht 72.0 in | Wt 219.4 lb

## 2023-12-22 DIAGNOSIS — I10 Essential (primary) hypertension: Secondary | ICD-10-CM | POA: Diagnosis not present

## 2023-12-22 DIAGNOSIS — G5603 Carpal tunnel syndrome, bilateral upper limbs: Secondary | ICD-10-CM

## 2023-12-22 DIAGNOSIS — M545 Low back pain, unspecified: Secondary | ICD-10-CM

## 2023-12-22 DIAGNOSIS — C61 Malignant neoplasm of prostate: Secondary | ICD-10-CM | POA: Diagnosis not present

## 2023-12-22 DIAGNOSIS — G8929 Other chronic pain: Secondary | ICD-10-CM

## 2023-12-22 DIAGNOSIS — M810 Age-related osteoporosis without current pathological fracture: Secondary | ICD-10-CM | POA: Diagnosis not present

## 2023-12-22 MED ORDER — BACLOFEN 5 MG PO TABS: 5.0000 mg | ORAL_TABLET | ORAL | 3 refills | Status: DC | PRN

## 2023-12-22 NOTE — Progress Notes (Signed)
 Careteam: Patient Care Team: Sherlynn Madden, MD as PCP - General (Internal Medicine) Liam Lerner, MD as Attending Physician (Orthopedic Surgery) Vertell Pont, RN as Oncology Nurse Navigator Babara Call, MD as Consulting Physician (Oncology)  PLACE OF SERVICE:  [facility]  Advanced Directive information    No Known Allergies  Chief Complaint  Patient presents with   Medical Management of Chronic Issues    3 month follow up      Discussed the use of AI scribe software for clinical note transcription with the patient, who gave verbal consent to proceed.  History of Present Illness Brian Bean is an 83 year old male who presents for follow-up on his current treatment plan.  He has been diagnosed with carpal tunnel syndrome, experiencing numbness in both hands, more pronounced in the right hand, which he predominantly uses for tasks such as using a mouse and writing. He has decided to proceed with injections in both arms and has opted against surgery for the right hand at this time.  He experiences moderate neck pain, rated at 1-2 out of 10, and is undergoing physical therapy and mild aquatic exercises at the [facility]. He also spends time in a jacuzzi five days a week. Previously, he had mid to lower back pain for which he was prescribed muscle relaxants. These have been effective, and he is now taking them every other day. No dizziness or drowsiness from the medication after the initial adjustment period.  He has a history of prostate cancer and is under the care of an oncologist. He reports that the MRI was smaller than the one done twelve months ago, and that apparently it is reducing in size. He experiences hot flashes as a side effect of his cancer treatment but reports a good appetite and no weight loss.  He reports a fall since his last visit, which occurred when he slipped on a wet ramp. No dizziness or other symptoms contributing to the fall.     No chest pain, breathing  difficulties, heartburn, acid reflux, urinary or stool incontinence, and no recent falls due to dizziness. He reports short stamina but no issues with sleeping flat or waking up short of breath, except for frequent urination at night.       Review of Systems:  Review of Systems  Constitutional:  Negative for chills and fever.  HENT:  Negative for congestion and sore throat.   Respiratory:  Negative for cough, sputum production and shortness of breath.   Cardiovascular:  Negative for chest pain, palpitations and leg swelling.  Gastrointestinal:  Negative for abdominal pain, heartburn and nausea.  Genitourinary:  Negative for dysuria, frequency and hematuria.  Musculoskeletal:  Positive for back pain. Negative for falls and myalgias.  Neurological:  Negative for dizziness.   Negative unless indicated in HPI.   Past Medical History:  Diagnosis Date   Anxiety    Arthritis    Cancer (HCC)    skin lesion scalp   CLL (chronic lymphocytic leukemia) (HCC) 01/06/2013   CLL (chronic lymphocytic leukemia) (HCC)    Depression    Head injury    fell from a horse   Hypertension    Lymphocytosis    PONV (postoperative nausea and vomiting)    extremely sick did not get sick after hip surgery in 2013   Prostate cancer [facility])    Past Surgical History:  Procedure Laterality Date   APPENDECTOMY     BRONCHIAL BIOPSY  04/16/2021   Procedure: BRONCHIAL BIOPSIES;  Surgeon: Brenna Adine CROME, DO;  Location: [facility];  Service: Pulmonary;;   BRONCHIAL BRUSHINGS  04/16/2021   Procedure: BRONCHIAL BRUSHINGS;  Surgeon: Brenna Adine CROME, DO;  Location: [facility];  Service: Pulmonary;;   BRONCHIAL NEEDLE ASPIRATION BIOPSY  04/16/2021   Procedure: BRONCHIAL NEEDLE ASPIRATION BIOPSIES;  Surgeon: Brenna Adine CROME, DO;  Location: [facility];  Service: Pulmonary;;   BRONCHIAL WASHINGS  04/16/2021   Procedure: BRONCHIAL WASHINGS;  Surgeon: Brenna Adine CROME, DO;  Location: [facility];  Service:  Pulmonary;;   CATARACT EXTRACTION, BILATERAL     CERVICAL DISC SURGERY  2000   COLONOSCOPY  2011   [facility].    FRACTURE SURGERY     rt ankle   HERNIA REPAIR     rt and  undistended testicle   PROSTATE BIOPSY     REVERSE SHOULDER ARTHROPLASTY Right 05/01/2020   Procedure: REVERSE SHOULDER ARTHROPLASTY;  Surgeon: Edie Norleen PARAS, MD;  Location: [facility] ORS;  Service: Orthopedics;  Laterality: Right;   rt hip  2012   pinned   TOTAL HIP ARTHROPLASTY  05/03/2012   Procedure: TOTAL HIP ARTHROPLASTY;  Surgeon: Dempsey PARAS Sensor, MD;  Location: [facility];  Service: Orthopedics;  Laterality: Right;   VIDEO BRONCHOSCOPY WITH ENDOBRONCHIAL NAVIGATION Right 04/16/2021   Procedure: VIDEO BRONCHOSCOPY WITH ENDOBRONCHIAL NAVIGATION;  Surgeon: Brenna Adine CROME, DO;  Location: [facility];  Service: Pulmonary;  Laterality: Right;  ION w/ CIOS   VIDEO BRONCHOSCOPY WITH RADIAL ENDOBRONCHIAL ULTRASOUND  04/16/2021   Procedure: RADIAL ENDOBRONCHIAL ULTRASOUND;  Surgeon: Brenna Adine CROME, DO;  Location: [facility];  Service: Pulmonary;;   Social History:   reports that he quit smoking about 46 years ago. His smoking use included cigarettes. He started smoking about 56 years ago. He has a 5 pack-year smoking history. He has never used smokeless tobacco. He reports current alcohol use of about 21.0 standard drinks of alcohol per week. He reports that he does not use drugs.  Family History  Problem Relation Age of Onset   Heart disease Mother    Cancer Father        unk type of cancer    Medications: Patient's Medications  New Prescriptions   No medications on file  Previous Medications   ACETAMINOPHEN  (TYLENOL ) 500 MG TABLET    Take 500 mg by mouth as needed.   ALENDRONATE  (FOSAMAX ) 70 MG TABLET    Take 1 tablet (70 mg total) by mouth once a week. Take with a full glass of water  on an empty stomach.   AMLODIPINE  (NORVASC ) 5 MG TABLET    Take 2.5 mg by mouth daily.   APALUTAMIDE  (ERLEADA ) 60 MG TABLET    Take 4  tablets (240 mg total) by mouth daily.   ASCORBIC ACID (VITAMIN C) 1000 MG TABLET    Take 1,000 mg by mouth daily.   ASPIRIN  EC 81 MG TABLET    Take 81 mg by mouth daily. Swallow whole.   ATORVASTATIN  (LIPITOR) 20 MG TABLET    Take 20 mg by mouth daily.   BACLOFEN  5 MG TABS    TAKE ONE TABLET BY MOUTH TWICE A DAY (IN THE MORNING AND AT BEDTIME)   CARBOXYMETHYLCELLULOSE (REFRESH PLUS) 0.5 % SOLN    Place 1 drop into both eyes as needed.   CARVEDILOL  (COREG ) 12.5 MG TABLET    Take 12.5 mg by mouth 2 (two) times daily with a meal.    CHOLECALCIFEROL 125 MCG (5000 UT) TABS    Take by mouth.  FLUOROURACIL (EFUDEX) 5 % CREAM    Apply 1 application  topically as needed (precancerous spots).   FLUOXETINE  (PROZAC ) 40 MG CAPSULE    Take 1 capsule (40 mg total) by mouth daily.   FLUTICASONE  (FLONASE ) 50 MCG/ACT NASAL SPRAY    Place 1 spray into both nostrils as needed for allergies or rhinitis.   GEMTESA 75 MG TABS    Take 1 tablet by mouth daily.   HYDROCHLOROTHIAZIDE  (HYDRODIURIL ) 12.5 MG TABLET    Take 1 tablet (12.5 mg total) by mouth daily.   IPRATROPIUM (ATROVENT) 0.06 % NASAL SPRAY    Place 2 sprays into both nostrils as needed for rhinitis.   KETOCONAZOLE (NIZORAL) 2 % SHAMPOO    Apply 1 application  topically daily as needed for irritation.   LEUPROLIDE , 6 MONTH, (ELIGARD ) 45 MG INJECTION    Inject 45 mg into the skin every 6 (six) months.   LIDOCAINE  1.8 % PTCH    Place 1 patch onto the skin as needed. Remove & Discard patch within 12 hours or as directed by MD   LISINOPRIL  (ZESTRIL ) 10 MG TABLET    Take 1 tablet (10 mg total) by mouth daily.   MISC. DEVICES (WRIST BRACE) MISC    1 Application by Does not apply route daily.   MODAFINIL (PROVIGIL) 100 MG TABLET    Take 100 mg by mouth daily.   TAMSULOSIN (FLOMAX) 0.4 MG CAPS CAPSULE    Take 0.4 mg by mouth daily.  Modified Medications   No medications on file  Discontinued Medications   No medications on file    Physical Exam: There were  no vitals filed for this visit. There is no height or weight on file to calculate BMI. BP Readings from Last 3 Encounters:  12/21/23 (!) 142/88  11/05/23 136/68  10/21/23 120/80   Wt Readings from Last 3 Encounters:  12/21/23 210 lb (95.3 kg)  11/05/23 210 lb (95.3 kg)  10/21/23 213 lb 9.6 oz (96.9 kg)    Physical Exam Constitutional:      Appearance: Normal appearance.  HENT:     Head: Normocephalic and atraumatic.  Cardiovascular:     Rate and Rhythm: Normal rate and regular rhythm.     Pulses: Normal pulses.     Heart sounds: Normal heart sounds.  Pulmonary:     Effort: No respiratory distress.     Breath sounds: No stridor. No wheezing or rales.  Abdominal:     General: Bowel sounds are normal. There is no distension.     Palpations: Abdomen is soft.     Tenderness: There is no abdominal tenderness. There is no guarding.  Musculoskeletal:        General: No swelling.  Neurological:     Mental Status: He is alert. Mental status is at baseline.     Motor: No weakness.     Comments: Decreased sensations on the dorsum of both hands  Good hand grip strength      Labs reviewed: Basic Metabolic Panel: Recent Labs    12/25/22 1506 03/24/23 1346 06/23/23 1444 08/11/23 1111 09/15/23 1419 12/15/23 1611  NA  --    < > 137  --  137 141  K  --    < > 3.6  --  3.5 3.9  CL  --    < > 102  --  99 107  CO2  --    < > 26  --  28 25  GLUCOSE  --    < >  81  --  84 101*  BUN  --    < > 20  --  25* 24*  CREATININE  --    < > 0.75  --  0.99 0.68  CALCIUM   --    < > 9.0  --  8.8* 9.0  TSH 1.763  --   --  1.64  --   --    < > = values in this interval not displayed.   Liver Function Tests: Recent Labs    06/23/23 1444 09/15/23 1419 12/15/23 1611  AST 18 17 16   ALT 14 15 15   ALKPHOS 45 44 41  BILITOT 0.5 0.7 0.4  PROT 6.2* 6.3* 6.5  ALBUMIN 3.8 4.0 3.9   No results for input(s): LIPASE, AMYLASE in the last 8760 hours. No results for input(s): AMMONIA in the  last 8760 hours. CBC: Recent Labs    06/23/23 1445 09/15/23 1419 12/15/23 1611  WBC 13.3* 10.6* 10.7*  NEUTROABS 4.8 4.1 4.2  HGB 13.4 13.5 13.0  HCT 40.6 40.5 39.1  MCV 92.5 91.2 92.7  PLT 182 162 144*   Lipid Panel: No results for input(s): CHOL, HDL, LDLCALC, TRIG, CHOLHDL, LDLDIRECT in the last 8760 hours. TSH: Recent Labs    12/25/22 1506 08/11/23 1111  TSH 1.763 1.64   A1C: Lab Results  Component Value Date   HGBA1C 5.6 08/11/2023    Assessment and Plan Assessment & Plan  1. Chronic bilateral low back pain without sciatica (Primary) Mild lower back pain  No red flag signs Take tylenol  prn Limit the use of baclofen   Informed patient about the side effects  - Baclofen  5 MG TABS; Take 1 tablet (5 mg total) by mouth as needed.  Dispense: 30 tablet; Refill: 3  2. Primary hypertension  At goal     Latest Ref Rng & Units 12/15/2023    4:11 PM 09/15/2023    2:19 PM 06/23/2023    2:44 PM  BMP  Glucose 70 - 99 mg/dL 898  84  81   BUN 8 - 23 mg/dL 24  25  20    Creatinine 0.61 - 1.24 mg/dL 9.31  9.00  9.24   Sodium 135 - 145 mmol/L 141  137  137   Potassium 3.5 - 5.1 mmol/L 3.9  3.5  3.6   Chloride 98 - 111 mmol/L 107  99  102   CO2 22 - 32 mmol/L 25  28  26    Calcium  8.9 - 10.3 mg/dL 9.0  8.8  9.0      3. Prostate cancer (HCC)  Psa 0.01 Denies hematuria  Follow up with oncology  4. Bilateral carpal tunnel syndrome  C/o numbness in both hands Follow up with neurology for steroid injections  5. Age related osteoporosis, unspecified pathological fracture presence  Cont with fosmax, cal, cit d supplements

## 2023-12-22 NOTE — Patient Instructions (Signed)
Schedule AWV.  

## 2023-12-23 ENCOUNTER — Encounter: Payer: Self-pay | Admitting: Oncology

## 2023-12-23 ENCOUNTER — Ambulatory Visit: Admitting: Sports Medicine

## 2023-12-23 ENCOUNTER — Inpatient Hospital Stay: Admitting: Oncology

## 2023-12-23 VITALS — BP 123/63 | HR 94 | Temp 97.1°F | Resp 19 | Ht 72.0 in | Wt 218.0 lb

## 2023-12-23 DIAGNOSIS — C7801 Secondary malignant neoplasm of right lung: Secondary | ICD-10-CM | POA: Diagnosis not present

## 2023-12-23 DIAGNOSIS — Z5111 Encounter for antineoplastic chemotherapy: Secondary | ICD-10-CM | POA: Diagnosis not present

## 2023-12-23 DIAGNOSIS — Z79899 Other long term (current) drug therapy: Secondary | ICD-10-CM | POA: Diagnosis not present

## 2023-12-23 DIAGNOSIS — M546 Pain in thoracic spine: Secondary | ICD-10-CM | POA: Diagnosis not present

## 2023-12-23 DIAGNOSIS — C9110 Chronic lymphocytic leukemia of B-cell type not having achieved remission: Secondary | ICD-10-CM

## 2023-12-23 DIAGNOSIS — C61 Malignant neoplasm of prostate: Secondary | ICD-10-CM | POA: Diagnosis not present

## 2023-12-23 DIAGNOSIS — C7802 Secondary malignant neoplasm of left lung: Secondary | ICD-10-CM | POA: Diagnosis not present

## 2023-12-23 DIAGNOSIS — R5383 Other fatigue: Secondary | ICD-10-CM

## 2023-12-23 DIAGNOSIS — C3431 Malignant neoplasm of lower lobe, right bronchus or lung: Secondary | ICD-10-CM | POA: Diagnosis not present

## 2023-12-23 DIAGNOSIS — Z79818 Long term (current) use of other agents affecting estrogen receptors and estrogen levels: Secondary | ICD-10-CM

## 2023-12-23 NOTE — Assessment & Plan Note (Signed)
 Treatment plan as listed above.

## 2023-12-23 NOTE — Progress Notes (Signed)
 Hematology/Oncology Progress note [phone] [phone]    CHIEF COMPLAINTS/REASON FOR VISIT:  Prostate cancer, CLL  ASSESSMENT & PLAN:   Cancer Staging  CLL (chronic lymphocytic leukemia) (HCC) Staging form: Chronic Lymphocytic Leukemia / Small Lymphocytic Lymphoma, AJCC 8th Edition - Clinical stage from 01/10/2021: Modified Rai Stage 0 (Modified Rai risk: Low, Lymphocytosis: Present, Adenopathy: Absent, Organomegaly: Absent, Anemia: Absent, Thrombocytopenia: Absent) - Signed by Lonn Hicks, MD on 01/10/2021  Prostate cancer [facility]) Staging form: Prostate, AJCC 8th Edition - Clinical: Stage IVB (ycTX, ycNX, cM1) - Signed by Babara Call, MD on 10/08/2021   Prostate cancer [facility]) stage IV castration sensitive prostate cancer with multiple radiotracer avid lung nodules on PSMA. Previous lung nodule biopsy results are consistent with malignant cells, non-small cell carcinoma, not enough tissue for further work-up for tissue origin. Nodules are avid on PSMA, likely metastatic prostate cancer lung involvement.  Continue Adrogen deprivation therapy.-  next due Lupron  45mg  Q6 months - Oct 2025 Labs are reviewed and discussed with patient. PSA 0.01 Continue Apalutamide  240mg  daily   Encounter for monitoring androgen deprivation therapy    Fatigue B12 is borderline normal, recommend B12 1000 mcg daily. Otc supply  CLL (chronic lymphocytic leukemia) (HCC) Diagnosed on 07/08/2012.  Currently on watchful waiting  Orders Placed This Encounter  Procedures   CBC with Differential (Cancer Center Only)    Standing Status:   Future    Expected Date:   03/24/2024    Expiration Date:   06/22/2024   CMP (Cancer Center only)    Standing Status:   Future    Expected Date:   03/24/2024    Expiration Date:   06/22/2024   PSA    Standing Status:   Future    Expected Date:   03/24/2024    Expiration Date:   06/22/2024   Vitamin B12    Standing Status:   Future    Expected Date:    03/24/2024    Expiration Date:   06/22/2024     Follow-up in 3 months All questions were answered. The patient knows to call the clinic with any problems questions or concerns.   Call Babara, MD, PhD [facility] Hematology Oncology 12/23/2023    HISTORY OF PRESENTING ILLNESS:   Brian Bean is a  83 y.o.  male with PMH listed below was seen in consultation at the request of  Veludandi, Prashanthi, *  for evaluation of prostate cancer.  Patient's oncology care was previously with Dr. Amadeo Mitten.  Patient presents for second opinion and prefers to transfer care to our cancer center. I reviewed patient's previous oncology records.  Oncology History  CLL (chronic lymphocytic leukemia) (HCC)  07/08/2012 Pathology Results   Accession #: [id number]  peripheral blood comfirmed CLL   2015 Initial Diagnosis   #History of prostate cancer, initially diagnosed in June 2015, Gleason score of 6, patient has been on active surveillance between 2015-2019, PSA gradually increasing up to 16.9.   06/24/2016 Pathology Results   CLL FISH showed 3.67% positive for deletion 13 q and 5% positive for p53 deletion   01/10/2021 Cancer Staging   Staging form: Chronic Lymphocytic Leukemia / Small Lymphocytic Lymphoma, AJCC 8th Edition - Clinical stage from 01/10/2021: Modified Rai Stage 0 (Modified Rai risk: Low, Lymphocytosis: Present, Adenopathy: Absent, Organomegaly: Absent, Anemia: Absent, Thrombocytopenia: Absent) - Signed by Lonn Hicks, MD on 01/10/2021 Stage prefix: Initial diagnosis   07/02/2022 -  Chemotherapy   Started on apalutamide  240mg     Prostate cancer (HCC)  2015 Initial Diagnosis   #History of prostate cancer, initially diagnosed in June 2015, Gleason score of 6, patient has been on active surveillance between 2015-2019, PSA gradually increasing up to 16.9.   06/24/2016 Initial Diagnosis   Prostate cancer (HCC)   12/28/2020 Imaging   12/28/2020, PSMA at [facility] showed multiple bilateral radiotracer  avid pulmonary nodules.  For example right upper lobe 1.2 x 1.4 cm and the left lower lobe 1.5 x 1.3 cm.  Prostate showed no focal radiotracer uptake to suggest local recurrence.  No PSMA avid pelvic, retroperitoneal, or mesenteric adenopathy.  Multiple pulmonary radiotracer avid nodules concerning for metastatic disease.  No osseous metastatic disease.  Severe three-vessel coronary artery calcifications   04/16/2021 Initial Biopsy   patient underwent biopsy via bronchoscopy. Lung R LL biopsy showed malignant cells consistent with non-small cell carcinoma. Lung RLL brushing showed malignant cells consistent with non-small cell carcinoma, Lung R UL brushing showed no malignant cells. Lung R UL biopsy showed malignant cells consistent with non-small cell carcinoma. Lung RUL lavage showed no malignant cells identified. Immunostains for TTF-1, Napsin A, p63 and CK5/6 were attempted but are noncontributory.  There is insufficient tumor tissue on the cellblock for additional studies.     05/2021 Tumor Marker   PSA 8.7   05/10/2021 Miscellaneous   05/10/2021, started on androgen deprivation therapy. Eligard  30 mg every 4 months   08/2021 Tumor Marker   PSA 0.2   10/08/2021 Cancer Staging   Staging form: Prostate, AJCC 8th Edition - Clinical: Stage IVB (ycTX, cNX, cM1) - Signed by Babara Call, MD on 10/08/2021 Stage prefix: Post-therapy   10/23/2021 Imaging   PSMA PET scan showed Small bilateral pulmonary metastases show intense radiopharmaceutical uptake, which show slight decrease in size since prior outside PET-CT. No new or progressive metastatic disease.   11/08/2021 Imaging   CT chest wo contrast showed the scattered pulmonary nodules are mildly reduced in size compared to 04/16/2021. No new nodules identified.   02/07/2022 Procedure   Status post left pleural effusion thoracentesis -cytology not diagnostic of malignancy.   07/02/2022 -  Chemotherapy   07/02/2022 apalutamide  240mg .     07/15/2022  Tumor Marker   PSA <0.01   08/13/2022 Tumor Marker   PSA <0.01   03/24/2023 Tumor Marker   PSA <0.01   09/19/2023 Imaging   CAT scan showed 1. Decreased size and radiotracer activity of pulmonary metastasis. 2. No evidence of local prostate carcinoma recurrence in the prostate gland. 3. No evidence of metastatic adenopathy in the pelvis or periaortic retroperitoneum. 4. No evidence of visceral metastasis or skeletal metastasis.    INTERVAL HISTORY Brian Bean is a 83 y.o. male who has above history reviewed by me today presents for follow up visit for prostate cancer. Patient reports feeling well.   07/02/2022 started on apalutamide  240mg  daily.  Overall he tolerates treatment with no significant side effects. Denies SOB, cough. Patient takes oral vitamin B12 supplementation.   Review of Systems  Constitutional:  Positive for fatigue. Negative for appetite change, chills, fever and unexpected weight change.  HENT:   Negative for hearing loss and voice change.   Eyes:  Negative for eye problems and icterus.  Respiratory:  Negative for chest tightness, cough and shortness of breath.   Cardiovascular:  Negative for chest pain and leg swelling.  Gastrointestinal:  Negative for abdominal distention and abdominal pain.  Endocrine: Positive for hot flashes.  Genitourinary:  Negative for difficulty urinating, dysuria and frequency.   Musculoskeletal:  Negative for arthralgias.  Skin:  Negative for itching and rash.  Neurological:  Negative for light-headedness and numbness.  Hematological:  Negative for adenopathy. Does not bruise/bleed easily.  Psychiatric/Behavioral:  Negative for confusion.     MEDICAL HISTORY:  Past Medical History:  Diagnosis Date   Anxiety    Arthritis    Cancer (HCC)    skin lesion scalp   CLL (chronic lymphocytic leukemia) (HCC) 01/06/2013   CLL (chronic lymphocytic leukemia) (HCC)    Depression    Head injury    fell from a horse   Hypertension     Lymphocytosis    PONV (postoperative nausea and vomiting)    extremely sick did not get sick after hip surgery in 2013   Prostate cancer [facility])     SURGICAL HISTORY: Past Surgical History:  Procedure Laterality Date   APPENDECTOMY     BRONCHIAL BIOPSY  04/16/2021   Procedure: BRONCHIAL BIOPSIES;  Surgeon: Brenna Adine CROME, DO;  Location: [facility];  Service: Pulmonary;;   BRONCHIAL BRUSHINGS  04/16/2021   Procedure: BRONCHIAL BRUSHINGS;  Surgeon: Brenna Adine CROME, DO;  Location: [facility];  Service: Pulmonary;;   BRONCHIAL NEEDLE ASPIRATION BIOPSY  04/16/2021   Procedure: BRONCHIAL NEEDLE ASPIRATION BIOPSIES;  Surgeon: Brenna Adine CROME, DO;  Location: [facility];  Service: Pulmonary;;   BRONCHIAL WASHINGS  04/16/2021   Procedure: BRONCHIAL WASHINGS;  Surgeon: Brenna Adine CROME, DO;  Location: [facility];  Service: Pulmonary;;   CATARACT EXTRACTION, BILATERAL     CERVICAL DISC SURGERY  2000   COLONOSCOPY  2011   [facility].    FRACTURE SURGERY     rt ankle   HERNIA REPAIR     rt and  undistended testicle   PROSTATE BIOPSY     REVERSE SHOULDER ARTHROPLASTY Right 05/01/2020   Procedure: REVERSE SHOULDER ARTHROPLASTY;  Surgeon: Edie Norleen PARAS, MD;  Location: [facility] ORS;  Service: Orthopedics;  Laterality: Right;   rt hip  2012   pinned   TOTAL HIP ARTHROPLASTY  05/03/2012   Procedure: TOTAL HIP ARTHROPLASTY;  Surgeon: Dempsey PARAS Sensor, MD;  Location: [facility] OR;  Service: Orthopedics;  Laterality: Right;   VIDEO BRONCHOSCOPY WITH ENDOBRONCHIAL NAVIGATION Right 04/16/2021   Procedure: VIDEO BRONCHOSCOPY WITH ENDOBRONCHIAL NAVIGATION;  Surgeon: Brenna Adine CROME, DO;  Location: [facility];  Service: Pulmonary;  Laterality: Right;  ION w/ CIOS   VIDEO BRONCHOSCOPY WITH RADIAL ENDOBRONCHIAL ULTRASOUND  04/16/2021   Procedure: RADIAL ENDOBRONCHIAL ULTRASOUND;  Surgeon: Brenna Adine CROME, DO;  Location: [facility];  Service: Pulmonary;;    SOCIAL HISTORY: Social History   Socioeconomic  History   Marital status: Divorced    Spouse name: Not on file   Number of children: 1   Years of education: Not on file   Highest education level: Not on file  Occupational History    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[employer])  Tobacco Use   Smoking status: Former    Current packs/day: 0.00    Average packs/day: 0.5 packs/day for 10.0 years (5.0 ttl pk-yrs)    Types: Cigarettes    Start date: 04/29/1967    Quit date: 04/28/1977    Years since quitting: 46.6   Smokeless tobacco: Never   Tobacco comments:    10 oz alcohol wkly  Vaping Use   Vaping status: Never Used  Substance and Sexual Activity   Alcohol use: Yes    Alcohol/week: 21.0 standard drinks of alcohol    Types: 5 Glasses of wine, 16 Shots of  liquor per week    Comment: 14 ounces a week   Drug use: No   Sexual activity: Not on file  Other Topics Concern   Not on file  Social History Narrative   Retired.   Significant other: Shawnee Hoard   One son, Belvie      Tobacco use, amount per day now: 0   Past tobacco use, amount per day: 1/2-1 pack   How many years did you use tobacco: 12   Alcohol use (drinks per week): 7   Diet: Good   Do you drink/eat things with caffeine: Hell yes   Marital status: Widowed                                  What year were you married? 1966   Do you live in a house, apartment, assisted living, condo, trailer, etc.? House   Is it one or more stories? One   How many persons live in your home? 2   Do you have pets in your home?( please list) 2 Dogs, 1 Cat   Highest Level of education completed? Masters    Current or past profession: Sales   Do you exercise?  Yes                                Type and how often? Water  exercise   Do you have a living will? Yes   Do you have a DNR form?     Yes                              If not, do you want to discuss one?   Do you have signed POA/HPOA forms?  Yes                      If so, please bring to you appointment      Do you have  any difficulty bathing or dressing yourself? No   Do you have any difficulty preparing food or eating? No   Do you have any difficulty managing your medications? No   Do you have any difficulty managing your finances? No   Do you have any difficulty affording your medications? No   Social Drivers of Corporate investment banker Strain: Not on file  Food Insecurity: No Food Insecurity (09/16/2023)   Hunger Vital Sign    Worried About Running Out of Food in the Last Year: Never true    Ran Out of Food in the Last Year: Never true  Transportation Needs: No Transportation Needs (09/16/2023)   PRAPARE - Administrator, Civil Service (Medical): No    Lack of Transportation (Non-Medical): No  Physical Activity: Not on file  Stress: Not on file  Social Connections: Moderately Integrated (09/16/2023)   Social Connection and Isolation Panel    Frequency of Communication with Friends and Family: Three times a week    Frequency of Social Gatherings with Friends and Family: Three times a week    Attends Religious Services: More than 4 times per year    Active Member of Clubs or Organizations: Yes    Attends Banker Meetings: 1 to 4 times per year    Marital Status: Divorced  Intimate Partner Violence: Not At Risk (09/16/2023)  Humiliation, Afraid, Rape, and Kick questionnaire    Fear of Current or Ex-Partner: No    Emotionally Abused: No    Physically Abused: No    Sexually Abused: No    FAMILY HISTORY: Family History  Problem Relation Age of Onset   Heart disease Mother    Cancer Father        unk type of cancer    ALLERGIES:  has no known allergies.  MEDICATIONS:  Current Outpatient Medications  Medication Sig Dispense Refill   acetaminophen  (TYLENOL ) 500 MG tablet Take 500 mg by mouth as needed.     alendronate  (FOSAMAX ) 70 MG tablet Take 1 tablet (70 mg total) by mouth once a week. Take with a full glass of water  on an empty stomach. 12 tablet 3   amLODipine   (NORVASC ) 5 MG tablet Take 2.5 mg by mouth daily.     apalutamide  (ERLEADA ) 60 MG tablet Take 4 tablets (240 mg total) by mouth daily. 120 tablet 6   Ascorbic Acid (VITAMIN C) 1000 MG tablet Take 1,000 mg by mouth daily.     aspirin  EC 81 MG tablet Take 81 mg by mouth daily. Swallow whole.     atorvastatin  (LIPITOR) 20 MG tablet Take 20 mg by mouth daily.     Baclofen  5 MG TABS Take 1 tablet (5 mg total) by mouth as needed. 30 tablet 3   carboxymethylcellulose (REFRESH PLUS) 0.5 % SOLN Place 1 drop into both eyes as needed.     carvedilol  (COREG ) 12.5 MG tablet Take 12.5 mg by mouth 2 (two) times daily with a meal.      Cholecalciferol 125 MCG (5000 UT) TABS Take by mouth.     FLUoxetine  (PROZAC ) 40 MG capsule Take 1 capsule (40 mg total) by mouth daily.     fluticasone  (FLONASE ) 50 MCG/ACT nasal spray Place 1 spray into both nostrils as needed for allergies or rhinitis.     hydrochlorothiazide  (HYDRODIURIL ) 12.5 MG tablet Take 1 tablet (12.5 mg total) by mouth daily. 30 tablet 3   ketoconazole (NIZORAL) 2 % shampoo Apply 1 application  topically daily as needed for irritation.     leuprolide , 6 Month, (ELIGARD ) 45 MG injection Inject 45 mg into the skin every 6 (six) months.     Lidocaine  1.8 % PTCH Place 1 patch onto the skin as needed. Remove & Discard patch within 12 hours or as directed by MD     lisinopril  (ZESTRIL ) 10 MG tablet Take 1 tablet (10 mg total) by mouth daily. 90 tablet 1   Misc. Devices (WRIST BRACE) MISC 1 Application by Does not apply route daily. 2 each 0   modafinil (PROVIGIL) 100 MG tablet Take 100 mg by mouth daily.     tamsulosin (FLOMAX) 0.4 MG CAPS capsule Take 0.4 mg by mouth daily.     fluorouracil (EFUDEX) 5 % cream Apply 1 application  topically as needed (precancerous spots). (Patient not taking: Reported on 12/23/2023)     No current facility-administered medications for this visit.     PHYSICAL EXAMINATION: ECOG PERFORMANCE STATUS: 1 - Symptomatic but  completely ambulatory Vitals:   12/23/23 1341  BP: 123/63  Pulse: 94  Resp: 19  Temp: (!) 97.1 F (36.2 C)  SpO2: 97%    Filed Weights   12/23/23 1341  Weight: 218 lb (98.9 kg)     Physical Exam Constitutional:      General: He is not in acute distress. HENT:     Head: Normocephalic  and atraumatic.  Eyes:     General: No scleral icterus. Cardiovascular:     Rate and Rhythm: Normal rate and regular rhythm.     Heart sounds: Normal heart sounds.  Pulmonary:     Effort: Pulmonary effort is normal. No respiratory distress.     Breath sounds: No wheezing.  Abdominal:     General: Bowel sounds are normal. There is no distension.     Palpations: Abdomen is soft.  Musculoskeletal:        General: No deformity. Normal range of motion.     Cervical back: Normal range of motion and neck supple.  Skin:    General: Skin is warm and dry.     Findings: No erythema or rash.  Neurological:     Mental Status: He is alert and oriented to person, place, and time. Mental status is at baseline.     Cranial Nerves: No cranial nerve deficit.     Coordination: Coordination normal.  Psychiatric:        Mood and Affect: Mood normal.     LABORATORY DATA:  I have reviewed the data as listed Lab Results  Component Value Date   WBC 10.7 (H) 12/15/2023   HGB 13.0 12/15/2023   HCT 39.1 12/15/2023   MCV 92.7 12/15/2023   PLT 144 (L) 12/15/2023   Recent Labs    06/23/23 1444 09/15/23 1419 12/15/23 1611  NA 137 137 141  K 3.6 3.5 3.9  CL 102 99 107  CO2 26 28 25   GLUCOSE 81 84 101*  BUN 20 25* 24*  CREATININE 0.75 0.99 0.68  CALCIUM  9.0 8.8* 9.0  GFRNONAA >60 >60 >60  PROT 6.2* 6.3* 6.5  ALBUMIN 3.8 4.0 3.9  AST 18 17 16   ALT 14 15 15   ALKPHOS 45 44 41  BILITOT 0.5 0.7 0.4

## 2023-12-23 NOTE — Assessment & Plan Note (Addendum)
 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 Previous lung nodule biopsy results are consistent with malignant cells, non-small cell carcinoma, not enough tissue for further work-up for tissue origin. Nodules are avid on PSMA, likely metastatic prostate cancer lung involvement.  Continue Adrogen deprivation therapy.-  next due Lupron  45mg  Q6 months - Oct 2025 Labs are reviewed and discussed with patient. PSA 0.01 Continue Apalutamide  240mg  daily

## 2023-12-23 NOTE — Assessment & Plan Note (Signed)
Diagnosed on 07/08/2012.  Currently on watchful waiting

## 2023-12-23 NOTE — Assessment & Plan Note (Signed)
B12 is borderline normal, recommend B12 1000 mcg daily. [facility] supply

## 2023-12-23 NOTE — Assessment & Plan Note (Addendum)
 SABRA

## 2023-12-24 ENCOUNTER — Encounter: Payer: Self-pay | Admitting: Sports Medicine

## 2023-12-28 DIAGNOSIS — M546 Pain in thoracic spine: Secondary | ICD-10-CM | POA: Diagnosis not present

## 2023-12-30 DIAGNOSIS — M546 Pain in thoracic spine: Secondary | ICD-10-CM | POA: Diagnosis not present

## 2024-01-04 DIAGNOSIS — M546 Pain in thoracic spine: Secondary | ICD-10-CM | POA: Diagnosis not present

## 2024-01-05 ENCOUNTER — Ambulatory Visit
Attending: Student in an Organized Health Care Education/Training Program | Admitting: Student in an Organized Health Care Education/Training Program

## 2024-01-05 ENCOUNTER — Encounter: Payer: Self-pay | Admitting: Student in an Organized Health Care Education/Training Program

## 2024-01-05 VITALS — BP 149/84 | HR 66 | Temp 97.5°F | Resp 16 | Ht 73.0 in | Wt 212.0 lb

## 2024-01-05 DIAGNOSIS — M546 Pain in thoracic spine: Secondary | ICD-10-CM | POA: Diagnosis not present

## 2024-01-05 DIAGNOSIS — M47814 Spondylosis without myelopathy or radiculopathy, thoracic region: Secondary | ICD-10-CM | POA: Diagnosis not present

## 2024-01-05 DIAGNOSIS — M47894 Other spondylosis, thoracic region: Secondary | ICD-10-CM | POA: Insufficient documentation

## 2024-01-05 DIAGNOSIS — G8929 Other chronic pain: Secondary | ICD-10-CM | POA: Diagnosis not present

## 2024-01-05 NOTE — Progress Notes (Signed)
 Safety precautions to be maintained throughout the outpatient stay will include: orient to surroundings, keep bed in low position, maintain call bell within reach at all times, provide assistance with transfer out of bed and ambulation.

## 2024-01-05 NOTE — Progress Notes (Signed)
 PROVIDER NOTE: Interpretation of information contained herein should be left to medically-trained personnel. Specific patient instructions are provided elsewhere under Patient Instructions section of medical record. This document was created in part using AI and STT-dictation technology, any transcriptional errors that may result from this process are unintentional.  Patient: Brian Bean  Service: E/M Encounter  Provider: Wallie Sherry, Bean  DOB: Jun 30, 1940  Delivery: Face-to-face  Specialty: Interventional Pain Management  MRN: [id number]  Setting: Ambulatory outpatient facility  Specialty designation: 09  Type: New Patient  Location: Outpatient office facility  PCP: Brian Madden, Bean  DOS: 01/05/2024    Referring Prov.: Brian Bean   Primary Reason(s) for Visit: Encounter for initial evaluation of one or more chronic problems (new to examiner) potentially causing chronic pain, and posing a threat to normal musculoskeletal function. (Level of risk: High) CC: Back Pain  HPI  Brian Bean is an 83 y.o. year old, male patient, who comes for the first time to our practice referred by Brian Bean for our initial evaluation of his chronic pain. He has Avascular necrosis of right hip; CLL (chronic lymphocytic leukemia) (HCC); Thrombocytopenia (HCC); Prostate cancer (HCC); Lymphadenopathy, abdominal; History of skin cancer; Overweight (BMI 25.0-29.9); Malignant neoplasm of prostate metastatic to lung [facility]); Lung nodules; Goals of care, counseling/discussion; Multiple rib fractures; Arthritis; Hypertension; Pneumothorax; Wheeze; Hyperlipidemia, unspecified; Acute respiratory failure with hypoxia (HCC); Left arm weakness; Fatigue; Hypokalemia; Bilateral carpal tunnel syndrome; Rib pain on right side; Thoracic facet joint syndrome; Arthropathy of thoracic facet joint; and Chronic bilateral thoracic back pain on their problem list. Today he comes in for evaluation of his Back Pain  Pain  Assessment: Location: Lower (mid badk left and right no radiation) Back Radiating: mid back and lower right side of back goes around to ribs, states he has done xrays and no broken ribs Onset: More than a month ago Duration: Chronic pain Quality: Aching, Discomfort, Sharp Severity: 2 /10 (subjective, self-reported pain score)  Effect on ADL: ADLs prolonged walking,yard work, bending over Timing: Intermittent Modifying factors: massage, heat, light exercise BP: (!) 149/84  HR: 66  Onset and Duration: Gradual Cause of pain: Arthritis Severity: Getting better, NAS-11 at its worse: 8/10, NAS-11 at its best: 2/10, NAS-11 now: 2/10, and NAS-11 on the average: 3/10 Timing: Not influenced by the time of the day and During activity or exercise Aggravating Factors: Bending, Lifiting, and Stooping  Alleviating Factors: Hot packs, Medications, Using a brace, Relaxation therapy, and Warm showers or baths Associated Problems: Fatigue, Impotence, and Numbness Quality of Pain: Aching, Intermittent, Deep, and Dull Previous Examinations or Tests: CT scan, MRI scan, X-rays, and Chiropractic evaluation Previous Treatments: Chiropractic manipulations, Physical Therapy, Pool exercises, Relaxation therapy, Strengthening exercises, and Stretching exercises  Brian Bean is being evaluated for possible interventional pain management therapies for the treatment of his chronic pain.   Discussed the use of AI scribe software for clinical note transcription with the patient, who gave verbal consent to proceed.  History of Present Illness   Brian Bean is an 83 year old male who presents with chronic mid thoracic back pain following a fall.  He has been experiencing chronic pain in his mid back since mid-May 2025, following a fall over an unpainted bollard in a parking lot. The pain is located in the thoracic spine, specifically around T7 to T11, and does not radiate to the front. It is described as being in the  posterior thoracic region .  He states that it does radiate laterally  and anteriorly to his mid axillary line at times.  The pain is alleviated by physical therapy, mild exercise, using a jacuzzi, and occasionally by consuming 'top shelf scotch'. A thoracic MRI conducted on November 21, 2023, revealed a disc protrusion at T8-T9 and arthritis at T9-T10 and T10-T11. He has been engaging in conservative management, including physical therapy, and reports that his pain levels have decreased from 'seven, eights, and nines' to a more manageable level over time.  No pain that wraps around to the front. Rib x-rays were negative, confirming that the pain is not due to a rib fracture.  He is retired, having worked in [employer], which involved physically demanding tasks.       Meds   Current Outpatient Medications:    acetaminophen  (TYLENOL ) 500 MG tablet, Take 500 mg by mouth as needed., Disp: , Rfl:    alendronate  (FOSAMAX ) 70 MG tablet, Take 1 tablet (70 mg total) by mouth once a week. Take with a full glass of water  on an empty stomach., Disp: 12 tablet, Rfl: 3   amLODipine  (NORVASC ) 5 MG tablet, Take 2.5 mg by mouth daily., Disp: , Rfl:    apalutamide  (ERLEADA ) 60 MG tablet, Take 4 tablets (240 mg total) by mouth daily., Disp: 120 tablet, Rfl: 6   Ascorbic Acid (VITAMIN C) 1000 MG tablet, Take 1,000 mg by mouth daily., Disp: , Rfl:    aspirin  EC 81 MG tablet, Take 81 mg by mouth daily. Swallow whole., Disp: , Rfl:    atorvastatin  (LIPITOR) 20 MG tablet, Take 20 mg by mouth daily., Disp: , Rfl:    Baclofen  5 MG TABS, Take 1 tablet (5 mg total) by mouth as needed., Disp: 30 tablet, Rfl: 3   carboxymethylcellulose (REFRESH PLUS) 0.5 % SOLN, Place 1 drop into both eyes as needed., Disp: , Rfl:    carvedilol  (COREG ) 12.5 MG tablet, Take 12.5 mg by mouth 2 (two) times daily with a meal. , Disp: , Rfl:    Cholecalciferol 125 MCG (5000 UT) TABS, Take by mouth., Disp: , Rfl:     FLUoxetine  (PROZAC ) 40 MG capsule, Take 1 capsule (40 mg total) by mouth daily., Disp: , Rfl:    fluticasone  (FLONASE ) 50 MCG/ACT nasal spray, Place 1 spray into both nostrils as needed for allergies or rhinitis., Disp: , Rfl:    hydrochlorothiazide  (HYDRODIURIL ) 12.5 MG tablet, Take 1 tablet (12.5 mg total) by mouth daily., Disp: 30 tablet, Rfl: 3   ketoconazole (NIZORAL) 2 % shampoo, Apply 1 application  topically daily as needed for irritation., Disp: , Rfl:    leuprolide , 6 Month, (ELIGARD ) 45 MG injection, Inject 45 mg into the skin every 6 (six) months., Disp: , Rfl:    Lidocaine  1.8 % PTCH, Place 1 patch onto the skin as needed. Remove & Discard patch within 12 hours or as directed by Bean, Disp: , Rfl:    lisinopril  (ZESTRIL ) 10 MG tablet, Take 1 tablet (10 mg total) by mouth daily., Disp: 90 tablet, Rfl: 1   Misc. Devices (WRIST BRACE) MISC, 1 Application by Does not apply route daily., Disp: 2 each, Rfl: 0   modafinil (PROVIGIL) 100 MG tablet, Take 100 mg by mouth daily., Disp: , Rfl:    tamsulosin (FLOMAX) 0.4 MG CAPS capsule, Take 0.4 mg by mouth daily., Disp: , Rfl:    fluorouracil (EFUDEX) 5 % cream, Apply 1 application  topically as needed (precancerous spots). (Patient not taking: Reported on 01/05/2024), Disp: , Rfl:  Imaging Review  Cervical Imaging: Cervical MR wo contrast: No results found for this or any previous visit.  Cervical MR wo contrast: No results found for this or any previous visit.  Cervical MR w/wo contrast: No results found for this or any previous visit.  Cervical MR w contrast: No results found for this or any previous visit.  Cervical CT wo contrast: Results for orders placed during the hospital encounter of 01/26/22  CT Cervical Spine Wo Contrast  Narrative CLINICAL DATA:  Status post fall, shortness of breath, multiple broken ribs  EXAM: CT HEAD WITHOUT CONTRAST  CT CERVICAL SPINE WITHOUT CONTRAST  TECHNIQUE: Multidetector CT imaging of  the head and cervical spine was performed following the standard protocol without intravenous contrast. Multiplanar CT image reconstructions of the cervical spine were also generated.  RADIATION DOSE REDUCTION: This exam was performed according to the departmental dose-optimization program which includes automated exposure control, adjustment of the mA and/or kV according to patient size and/or use of iterative reconstruction technique.  COMPARISON:  None Available.  FINDINGS: CT HEAD FINDINGS  Brain: No evidence of acute infarction, hemorrhage, extra-axial collection, ventriculomegaly, or mass effect. Generalized cerebral atrophy. Periventricular white matter low attenuation likely secondary to microangiopathy.  Vascular: Cerebrovascular atherosclerotic calcifications are noted. No hyperdense vessels.  Skull: Negative for fracture or focal lesion.  Sinuses/Orbits: Visualized portions of the orbits are unremarkable. Visualized portions of the paranasal sinuses are unremarkable. Visualized portions of the mastoid air cells are unremarkable.  Other: None.  CT CERVICAL SPINE FINDINGS  Alignment: Normal.  Skull base and vertebrae: No acute fracture. No primary bone lesion or focal pathologic process. Old spinous process avulsion fractures.  Soft tissues and spinal canal: No prevertebral fluid or swelling. No visible canal hematoma.  Disc levels: Anterior cervical fusion at C5-6 without hardware failure or complication and solid osseous bridging across the disc space. Developmental fusion of the C2-3 vertebral bodies and posterior elements. At C3-4 there is degenerative disease with disc height loss, broad-based disc osteophyte complex, bilateral uncovertebral degenerative changes, bilateral facet arthropathy, and bilateral foraminal stenosis. At C4-5 there is a broad-based disc osteophyte complex, bilateral uncovertebral degenerative changes, bilateral facet arthropathy  and bilateral foraminal stenosis. At C5-6 there is interbody fusion, mild bilateral foraminal stenosis and bilateral mild facet arthropathy. At C6-7 there is disc height loss, bilateral uncovertebral degenerative changes, and mild bilateral foraminal stenosis.  Upper chest: 9 mm left apical pulmonary nodule. Trace left apical pneumothorax.  Other: Bilateral carotid artery atherosclerosis.  IMPRESSION: 1. No acute intracranial pathology. 2.  No acute osseous injury of the cervical spine. 3. Cervical spine spondylosis as described above. 4. Trace left apical pneumothorax.   Electronically Signed By: Julaine Blanch M.D. On: 01/26/2022 15:12    Narrative CLINICAL DATA:  Scoliosis, previous surgery.  New pain after fall.  EXAM: DG SCOLIOSIS EVAL COMPLETE SPINE 2-3V; LUMBAR SPINE FLEX AND EXTEND ONLY - 2-3 VIEW; CERVICAL SPINE - 2-3 VIEW  COMPARISON:  None Available.  FINDINGS: Cervical spine: Anterior fusion at C5-C6 with interbody spacer. Small disc space at C2-C3 may be postsurgical or congenital. Disc space narrowing and anterior spurring C3-C4 and C4-C5. There is straightening of normal lordosis. No abnormal motion on flexion or extension. C7 and the cervicothoracic junction are not well assessed.  Lumbar spine: 5 mm anterolisthesis of L5 on S1. No change in alignment on flexion or extension. There is no evidence of acute fracture. Moderate lower lumbar facet hypertrophy.  Scoliosis: 12 rib-bearing thoracic vertebra and  5 non-rib-bearing lumbar vertebra. Dextroscoliotic curvature of the lower thoracic and upper lumbar spine of 14 degrees. Exaggerated thoracic kyphosis. Normal lumbar lordosis. Slight flattening of T9 vertebra, new from 11/07/2021 chest CT. There is pelvic tilt with left iliac crest higher than right, approximately 2.5 cm (recommend correlation if imaging was obtained standing). Right hip arthroplasty. Right shoulder arthroplasty.  IMPRESSION: 1.  Dextroscoliotic curvature of the lower thoracic and upper lumbar spine of 14 degrees. 2. Exaggerated thoracic kyphosis. 3. Grade 1 anterolisthesis of L5 on S1, without change abnormal motion. No evidence of cervical instability. 4. Slight flattening of T9 vertebra, new from 2023 chest CT. 5. Pelvic tilt with left iliac crest higher than right.   Electronically Signed By: Andrea Gasman M.D. On: 11/13/2023 21:21  MR SHOULDER RIGHT WO CONTRAST  Narrative CLINICAL DATA:  Persistent lateral shoulder pain since injury picking up a bag of sand 6 weeks ago. No prior surgery.  EXAM: MRI OF THE RIGHT SHOULDER WITHOUT CONTRAST  TECHNIQUE: Multiplanar, multisequence MR imaging of the shoulder was performed. No intravenous contrast was administered.  COMPARISON:  None.  FINDINGS: Rotator cuff: Full-thickness, full width tears of the supraspinatus and infraspinatus tendons with retraction to the medial humeral head. High-grade partial-thickness articular surface tear of the superior subscapularis tendon. The teres minor tendon is unremarkable.  Muscles:  Mild infraspinatus muscle edema and atrophy.  Biceps long head:  Nonvisualization of the intra-articular portion.  Acromioclavicular Joint: Moderate arthropathy of the acromioclavicular joint. Type II acromion. Small amount of fluid in the subacromial/subdeltoid bursa.  Glenohumeral Joint: Moderate joint effusion.  No chondral defect.  Labrum: Degenerative tearing and blunting of the superior labrum. Small posteroinferior labral tear.  Bones:  No marrow abnormality, fracture or dislocation.  Other: None.  IMPRESSION: 1. Full-thickness, full width tears of the supraspinatus and infraspinatus tendons with retraction to the medial humeral head. Mild infraspinatus muscle edema and atrophy. 2. High-grade partial-thickness articular surface tear of the superior subscapularis tendon. 3. Nonvisualization of the intra-articular  biceps tendon, likely torn. 4. Degenerative tearing and blunting of the superior labrum. Small posteroinferior labral tear. 5. Moderate acromioclavicular osteoarthritis.   Electronically Signed By: Elsie ONEIDA Shoulder M.D. On: 01/19/2020 14:17  DG Shoulder Left  Narrative CLINICAL DATA:  Left shoulder pain after fall.  EXAM: LEFT SHOULDER - 2+ VIEW  COMPARISON:  January 26, 2022.  FINDINGS: Old left clavicular fracture is noted. Multiple left rib fractures are noted as described on prior CT scan. Moderate degenerative changes seen involving the left acromioclavicular joint. Glenohumeral joint is unremarkable.  IMPRESSION: Multiple acute left rib fractures as noted on prior CT scan. No other acute abnormality seen.   Electronically Signed By: Lynwood Landy Raddle M.D. On: 01/29/2022 08:21   MR THORACIC SPINE WO CONTRAST  Narrative EXAM: MR Thoracic Spine without 11/21/2023 05:44:00 PM  TECHNIQUE: Multiplanar multisequence MRI of the thoracic spine was performed without the administration of intravenous contrast.  COMPARISON: None available  CLINICAL HISTORY: Myelopathy, chronic, thoracic spine; Trunk numbness or tingling. Chronic upper back pain, prog worse.; Multiple falls over the years w/rib fx; States bilat hand/finger numbness.  FINDINGS:  BONES AND ALIGNMENT: Exaggerated kyphosis of the mid and upper thoracic spine. Chronic-appearing mild anterior height loss of the T4, T5, and T9 vertebral bodies. Marrow edema in the anterior, inferior corner of the T7 vertebral body is favored degenerative.  SPINAL CORD: Normal spinal cord signal.  SOFT TISSUES: Unremarkable.  DEGENERATIVE CHANGES: Central disc protrusion at T8-9 with mild deformity of  the ventral cord. Lower thoracic facet arthropathy, greatest at T9-10 and T10-11 without significant neural foraminal narrowing.  IMPRESSION: 1. Central disc protrusion at T8-9 with mild deformity of the ventral  cord. 2. Lower thoracic facet arthropathy, greatest at T9-10 and T10-11, without significant neural foraminal narrowing.  Electronically signed by: Ryan Chess Bean 11/27/2023 11:10 AM EDT RP Workstation: [id number]   DG Lumb Spine Flex&Ext Only  Narrative CLINICAL DATA:  Scoliosis, previous surgery.  New pain after fall.  EXAM: DG SCOLIOSIS EVAL COMPLETE SPINE 2-3V; LUMBAR SPINE FLEX AND EXTEND ONLY - 2-3 VIEW; CERVICAL SPINE - 2-3 VIEW  COMPARISON:  None Available.  FINDINGS: Cervical spine: Anterior fusion at C5-C6 with interbody spacer. Small disc space at C2-C3 may be postsurgical or congenital. Disc space narrowing and anterior spurring C3-C4 and C4-C5. There is straightening of normal lordosis. No abnormal motion on flexion or extension. C7 and the cervicothoracic junction are not well assessed.  Lumbar spine: 5 mm anterolisthesis of L5 on S1. No change in alignment on flexion or extension. There is no evidence of acute fracture. Moderate lower lumbar facet hypertrophy.  Scoliosis: 12 rib-bearing thoracic vertebra and 5 non-rib-bearing lumbar vertebra. Dextroscoliotic curvature of the lower thoracic and upper lumbar spine of 14 degrees. Exaggerated thoracic kyphosis. Normal lumbar lordosis. Slight flattening of T9 vertebra, new from 11/07/2021 chest CT. There is pelvic tilt with left iliac crest higher than right, approximately 2.5 cm (recommend correlation if imaging was obtained standing). Right hip arthroplasty. Right shoulder arthroplasty.  IMPRESSION: 1. Dextroscoliotic curvature of the lower thoracic and upper lumbar spine of 14 degrees. 2. Exaggerated thoracic kyphosis. 3. Grade 1 anterolisthesis of L5 on S1, without change abnormal motion. No evidence of cervical instability. 4. Slight flattening of T9 vertebra, new from 2023 chest CT. 5. Pelvic tilt with left iliac crest higher than right.   Electronically Signed By: Andrea Gasman M.D. On:  11/13/2023 21:21   Complexity Note: Imaging results reviewed.                         ROS  Cardiovascular: Daily Aspirin  intake and High blood pressure Pulmonary or Respiratory: Temporary stoppage of breathing during sleep Neurological: No reported neurological signs or symptoms such as seizures, abnormal skin sensations, urinary and/or fecal incontinence, being born with an abnormal open spine and/or a tethered spinal cord Psychological-Psychiatric: Depressed Gastrointestinal: No reported gastrointestinal signs or symptoms such as vomiting or evacuating blood, reflux, heartburn, alternating episodes of diarrhea and constipation, inflamed or scarred liver, or pancreas or irrregular and/or infrequent bowel movements Genitourinary: No reported renal or genitourinary signs or symptoms such as difficulty voiding or producing urine, peeing blood, non-functioning kidney, kidney stones, difficulty emptying the bladder, difficulty controlling the flow of urine, or chronic kidney disease Hematological: Brusing easily Endocrine: No reported endocrine signs or symptoms such as high or low blood sugar, rapid heart rate due to high thyroid  levels, obesity or weight gain due to slow thyroid  or thyroid  disease Rheumatologic: No reported rheumatological signs and symptoms such as fatigue, joint pain, tenderness, swelling, redness, heat, stiffness, decreased range of motion, with or without associated rash Musculoskeletal: Negative for myasthenia gravis, muscular dystrophy, multiple sclerosis or malignant hyperthermia Work History: Retired  Allergies  Mr. Metts has no known allergies.  Laboratory Chemistry Profile   Renal Lab Results  Component Value Date   BUN 24 (H) 12/15/2023   CREATININE 0.68 12/15/2023   GFRAA >60 05/10/2018   GFRNONAA >60 12/15/2023  PROTEINUR NEGATIVE 04/28/2012     Electrolytes Lab Results  Component Value Date   NA 141 12/15/2023   K 3.9 12/15/2023   CL 107 12/15/2023    CALCIUM  9.0 12/15/2023     Hepatic Lab Results  Component Value Date   AST 16 12/15/2023   ALT 15 12/15/2023   ALBUMIN 3.9 12/15/2023   ALKPHOS 41 12/15/2023     ID Lab Results  Component Value Date   SARSCOV2NAA NEGATIVE 04/16/2021   STAPHAUREUS POSITIVE (A) 04/23/2020   MRSAPCR NEGATIVE 04/23/2020     Bone Lab Results  Component Value Date   VD25OH 57.10 12/25/2022   TESTOSTERONE  279 05/09/2021     Endocrine Lab Results  Component Value Date   GLUCOSE 101 (H) 12/15/2023   GLUCOSEU NEGATIVE 04/28/2012   HGBA1C 5.6 08/11/2023   TSH 1.64 08/11/2023   TESTOSTERONE  279 05/09/2021     Neuropathy Lab Results  Component Value Date   VITAMINB12 353 09/15/2023   HGBA1C 5.6 08/11/2023     CNS No results found for: COLORCSF, APPEARCSF, RBCCOUNTCSF, WBCCSF, POLYSCSF, LYMPHSCSF, EOSCSF, PROTEINCSF, GLUCCSF, JCVIRUS, CSFOLI, IGGCSF, LABACHR, ACETBL   Inflammation (CRP: Acute  ESR: Chronic) No results found for: CRP, ESRSEDRATE, LATICACIDVEN   Rheumatology No results found for: RF, ANA, LABURIC, URICUR, LYMEIGGIGMAB, LYMEABIGMQN, HLAB27   Coagulation Lab Results  Component Value Date   INR 0.99 04/28/2012   LABPROT 13.0 04/28/2012   APTT 25 04/28/2012   PLT 144 (L) 12/15/2023     Cardiovascular Lab Results  Component Value Date   HGB 13.0 12/15/2023   HCT 39.1 12/15/2023     Screening Lab Results  Component Value Date   SARSCOV2NAA NEGATIVE 04/16/2021   STAPHAUREUS POSITIVE (A) 04/23/2020   MRSAPCR NEGATIVE 04/23/2020     Cancer No results found for: CEA, CA125, LABCA2   Allergens No results found for: ALMOND, APPLE, ASPARAGUS, AVOCADO, BANANA, BARLEY, BASIL, BAYLEAF, GREENBEAN, LIMABEAN, WHITEBEAN, BEEFIGE, REDBEET, BLUEBERRY, BROCCOLI, CABBAGE, MELON, CARROT, CASEIN, CASHEWNUT, CAULIFLOWER, CELERY     Note: Lab results reviewed.  PFSH  Drug: Mr.  Vieau  reports no history of drug use. Alcohol:  reports current alcohol use of about 21.0 standard drinks of alcohol per week. Tobacco:  reports that he quit smoking about 46 years ago. His smoking use included cigarettes. He started smoking about 56 years ago. He has a 5 pack-year smoking history. He has never used smokeless tobacco. Medical:  has a past medical history of Anxiety, Arthritis, Cancer (HCC), CLL (chronic lymphocytic leukemia) (HCC) (01/06/2013), CLL (chronic lymphocytic leukemia) (HCC), Depression, Head injury, Hypertension, Lymphocytosis, PONV (postoperative nausea and vomiting), and Prostate cancer (HCC). Family: family history includes Cancer in his father; Heart disease in his mother.  Past Surgical History:  Procedure Laterality Date   APPENDECTOMY     BRONCHIAL BIOPSY  04/16/2021   Procedure: BRONCHIAL BIOPSIES;  Surgeon: Brenna Adine CROME, DO;  Location: [facility];  Service: Pulmonary;;   BRONCHIAL BRUSHINGS  04/16/2021   Procedure: BRONCHIAL BRUSHINGS;  Surgeon: Brenna Adine CROME, DO;  Location: [facility];  Service: Pulmonary;;   BRONCHIAL NEEDLE ASPIRATION BIOPSY  04/16/2021   Procedure: BRONCHIAL NEEDLE ASPIRATION BIOPSIES;  Surgeon: Brenna Adine CROME, DO;  Location: [facility];  Service: Pulmonary;;   BRONCHIAL WASHINGS  04/16/2021   Procedure: BRONCHIAL WASHINGS;  Surgeon: Brenna Adine CROME, DO;  Location: [facility];  Service: Pulmonary;;   CATARACT EXTRACTION, BILATERAL     CERVICAL DISC SURGERY  2000   COLONOSCOPY  2011  [facility].    FRACTURE SURGERY     rt ankle   HERNIA REPAIR     rt and  undistended testicle   PROSTATE BIOPSY     REVERSE SHOULDER ARTHROPLASTY Right 05/01/2020   Procedure: REVERSE SHOULDER ARTHROPLASTY;  Surgeon: Edie Norleen PARAS, Bean;  Location: [facility];  Service: Orthopedics;  Laterality: Right;   rt hip  2012   pinned   TOTAL HIP ARTHROPLASTY  05/03/2012   Procedure: TOTAL HIP ARTHROPLASTY;  Surgeon: Dempsey PARAS Sensor, Bean;   Location: [facility];  Service: Orthopedics;  Laterality: Right;   VIDEO BRONCHOSCOPY WITH ENDOBRONCHIAL NAVIGATION Right 04/16/2021   Procedure: VIDEO BRONCHOSCOPY WITH ENDOBRONCHIAL NAVIGATION;  Surgeon: Brenna Adine CROME, DO;  Location: [facility];  Service: Pulmonary;  Laterality: Right;  ION w/ CIOS   VIDEO BRONCHOSCOPY WITH RADIAL ENDOBRONCHIAL ULTRASOUND  04/16/2021   Procedure: RADIAL ENDOBRONCHIAL ULTRASOUND;  Surgeon: Brenna Adine CROME, DO;  Location: [facility];  Service: Pulmonary;;   Active Ambulatory Problems    Diagnosis Date Noted   Avascular necrosis of right hip 05/05/2012   CLL (chronic lymphocytic leukemia) (HCC) 01/06/2013   Thrombocytopenia (HCC) 06/26/2014   Prostate cancer (HCC) 06/24/2016   Lymphadenopathy, abdominal 06/24/2016   History of skin cancer 08/20/2017   Overweight (BMI 25.0-29.9) 08/20/2017   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) 03/08/2021   Lung nodules 03/08/2021   Goals of care, counseling/discussion 10/08/2021   Multiple rib fractures 01/26/2022   Arthritis 01/26/2022   Hypertension 01/26/2022   Pneumothorax 01/27/2022   Wheeze 01/27/2022   Hyperlipidemia, unspecified 01/27/2022   Acute respiratory failure with hypoxia (HCC) 01/27/2022   Left arm weakness 01/28/2022   Fatigue 12/25/2022   Hypokalemia 03/26/2023   Bilateral carpal tunnel syndrome 12/21/2023   Rib pain on right side 12/21/2023   Thoracic facet joint syndrome 01/05/2024   Arthropathy of thoracic facet joint 01/05/2024   Chronic bilateral thoracic back pain 01/05/2024   Resolved Ambulatory Problems    Diagnosis Date Noted   Encounter for monitoring androgen deprivation therapy 09/25/2022   Past Medical History:  Diagnosis Date   Anxiety    Cancer (HCC)    Depression    Head injury    Lymphocytosis    PONV (postoperative nausea and vomiting)    Constitutional Exam  General appearance: Well nourished, well developed, and well hydrated. In no apparent  acute distress Vitals:   01/05/24 0823  BP: (!) 149/84  Pulse: 66  Resp: 16  Temp: (!) 97.5 F (36.4 C)  SpO2: 98%  Weight: 212 lb (96.2 kg)  Height: 6' 1 (1.854 m)   BMI Assessment: Estimated body mass index is 27.97 kg/m as calculated from the following:   Height as of this encounter: 6' 1 (1.854 m).   Weight as of this encounter: 212 lb (96.2 kg).  BMI interpretation table: BMI level Category Range association with higher incidence of chronic pain  <18 kg/m2 Underweight   18.5-24.9 kg/m2 Ideal body weight   25-29.9 kg/m2 Overweight Increased incidence by 20%  30-34.9 kg/m2 Obese (Class I) Increased incidence by 68%  35-39.9 kg/m2 Severe obesity (Class II) Increased incidence by 136%  >40 kg/m2 Extreme obesity (Class III) Increased incidence by 254%   Patient's current BMI Ideal Body weight  Body mass index is 27.97 kg/m. Ideal body weight: 79.9 kg (176 lb 2.4 oz) Adjusted ideal body weight: 86.4 kg (190 lb 7.8 oz)   BMI Readings from Last 4 Encounters:  01/05/24 27.97 kg/m  12/23/23  29.57 kg/m  12/22/23 29.76 kg/m  12/21/23 28.48 kg/m   Wt Readings from Last 4 Encounters:  01/05/24 212 lb (96.2 kg)  12/23/23 218 lb (98.9 kg)  12/22/23 219 lb 6.4 oz (99.5 kg)  12/21/23 210 lb (95.3 kg)    Psych/Mental status: Alert, oriented x 3 (person, place, & time)       Eyes: PERLA Respiratory: No evidence of acute respiratory distress  Thoracic Spine Area Exam  Skin & Axial Inspection: No masses, redness, or swelling Alignment: Symmetrical Functional ROM: Restricted ROM Stability: No instability detected Muscle Tone/Strength: Functionally intact. No obvious neuro-muscular anomalies detected. Sensory (Neurological): Musculoskeletal pain pattern Muscle strength & Tone: No palpable anomalies  Assessment  Primary Diagnosis & Pertinent Problem List: The primary encounter diagnosis was Thoracic facet joint syndrome. Diagnoses of Arthropathy of thoracic facet joint  and Chronic bilateral thoracic back pain were also pertinent to this visit.  Visit Diagnosis (New problems to examiner): 1. Thoracic facet joint syndrome   2. Arthropathy of thoracic facet joint   3. Chronic bilateral thoracic back pain    Plan of Care (Initial workup plan)  Assessment and Plan    Chronic thoracic back pain due to thoracic facet arthropathy and T8-T9 disc herniation   Chronic thoracic back pain began in mid-May after a fall over a parking lot bollard. Pain is localized to the posterior thoracic spine at T9-T11, without an obvious radiating component.  MRI reveals a T8-T9 disc protrusion and thoracic facet arthritis at T9-T11. Current management with physical therapy and conservative measures has reduced pain from severe levels (7-9/10).  Thoracic facet medial branch nerve blocks and ablation were discussed as options if pain persists or worsens. The disc herniation is unlikely to reabsorb spontaneously, but symptoms may improve with conservative treatment. He prefers to continue current management and reassess in 60-90 days if pain does not improve. D Continue physical therapy and conservative management, reassess pain in 60-90 days, consider diagnostic thoracic facet medial branch nerve block if pain persists, and discuss radiofrequency ablation if diagnostic blocks confirm the pain source.        Interventional management options: Mr. Amadon was informed that there is no guarantee that he would be a candidate for interventional therapies. The decision will be based on the results of diagnostic studies, as well as Mr. Liles's risk profile.  Procedure(s) under consideration:  Thoracic facet medial branch nerve block Thoracic ESI      Provider-requested follow-up: Return for patient will call to schedule F2F appt prn.  Future Appointments  Date Time Provider Department Center  03/23/2024  3:15 PM [facility] LAB [facility] None  03/30/2024  1:45 PM Babara Call, [facility] None   03/30/2024  2:00 PM [facility] INJECTION [facility] None  04/26/2024  1:00 PM Brian Madden, [facility] None   I discussed the assessment and treatment plan with the patient. The patient was provided an opportunity to ask questions and all were answered. The patient agreed with the plan and demonstrated an understanding of the instructions.  Patient advised to call back or seek an in-person evaluation if the symptoms or condition worsens.  Duration of encounter: .  Total time on encounter, as per AMA guidelines included both the face-to-face and non-face-to-face time personally spent by the physician and/or other qualified health care professional(s) on the day of the encounter (includes time in activities that require the physician or other qualified health care professional and does not include time in activities normally performed by clinical staff). Physician's  time may include the following activities when performed: Preparing to see the patient (e.g., pre-charting review of records, searching for previously ordered imaging, lab work, and nerve conduction tests) Review of prior analgesic pharmacotherapies. Reviewing PMP Interpreting ordered tests (e.g., lab work, imaging, nerve conduction tests) Performing post-procedure evaluations, including interpretation of diagnostic procedures Obtaining and/or reviewing separately obtained history Performing a medically appropriate examination and/or evaluation Counseling and educating the patient/family/caregiver Ordering medications, tests, or procedures Referring and communicating with other health care professionals (when not separately reported) Documenting clinical information in the electronic or other health record Independently interpreting results (not separately reported) and communicating results to the patient/ family/caregiver Care coordination (not separately reported)  Note by: Brian Bean (TTS and AI technology  used. I apologize for any typographical errors that were not detected and corrected.) Date: 01/05/2024; Time: 8:59 AM

## 2024-01-06 ENCOUNTER — Telehealth: Payer: Self-pay | Admitting: Neurosurgery

## 2024-01-06 NOTE — Telephone Encounter (Signed)
 I spoke to patient and informed him that Dr. Claudene is reaching out to another provider for the carpal tunnel injection. I told him we will send a referral when we know the best provider to send the patient to. Patient was appreciative and voiced understanding.

## 2024-01-06 NOTE — Telephone Encounter (Signed)
 Patient states he went to see Dr.Lateef yesterday but they discussed more thoracic pain.  Patient states he was under the impression that the appointment with Dr.Lateef was for his carpal tunnel issue and he would like to proceed with having the injection in his wrists, he is just wanting more clarity of what is going on.

## 2024-01-08 NOTE — Telephone Encounter (Signed)
 [facility] informed patient that we have not yet heard back from the provider on the Carpal Tunnel injections. He is a patient of Dr. Maree at [facility] and they do Carpal tunnel injection and he is able to reach out to them to see if they will set him up an appointment to have the injections. He is to call back if he has any questions.

## 2024-01-17 ENCOUNTER — Other Ambulatory Visit: Payer: Self-pay | Admitting: Sports Medicine

## 2024-01-17 DIAGNOSIS — I10 Essential (primary) hypertension: Secondary | ICD-10-CM

## 2024-01-24 ENCOUNTER — Other Ambulatory Visit: Payer: Self-pay | Admitting: Sports Medicine

## 2024-01-24 ENCOUNTER — Encounter: Payer: Self-pay | Admitting: Sports Medicine

## 2024-01-24 DIAGNOSIS — I10 Essential (primary) hypertension: Secondary | ICD-10-CM

## 2024-01-25 MED ORDER — HYDROCHLOROTHIAZIDE 12.5 MG PO TABS: 12.5000 mg | ORAL_TABLET | Freq: Every day | ORAL | 1 refills | Status: AC

## 2024-02-15 ENCOUNTER — Encounter: Payer: Self-pay | Admitting: Sports Medicine

## 2024-02-15 MED ORDER — COVID-19 MRNA VACCINE (PFIZER) 30 MCG/0.3ML IM SUSP: 0.3000 mL | Freq: Once | INTRAMUSCULAR | 0 refills | Status: AC

## 2024-02-17 ENCOUNTER — Encounter: Payer: Self-pay | Admitting: Oncology

## 2024-02-17 NOTE — Telephone Encounter (Signed)
 Good morning ladies, can one of you contact pt to r/s missed lab please.

## 2024-02-17 NOTE — Telephone Encounter (Signed)
 I will talk to pt

## 2024-02-20 ENCOUNTER — Encounter: Payer: Self-pay | Admitting: Sports Medicine

## 2024-02-22 MED ORDER — CARVEDILOL 12.5 MG PO TABS: 12.5000 mg | ORAL_TABLET | Freq: Two times a day (BID) | ORAL | 1 refills | Status: DC

## 2024-02-24 DIAGNOSIS — G5603 Carpal tunnel syndrome, bilateral upper limbs: Secondary | ICD-10-CM | POA: Diagnosis not present

## 2024-02-27 ENCOUNTER — Other Ambulatory Visit: Payer: Self-pay | Admitting: Sports Medicine

## 2024-02-27 DIAGNOSIS — C9110 Chronic lymphocytic leukemia of B-cell type not having achieved remission: Secondary | ICD-10-CM

## 2024-03-02 DIAGNOSIS — G5603 Carpal tunnel syndrome, bilateral upper limbs: Secondary | ICD-10-CM | POA: Diagnosis not present

## 2024-03-23 ENCOUNTER — Inpatient Hospital Stay: Attending: Oncology

## 2024-03-23 DIAGNOSIS — Z79899 Other long term (current) drug therapy: Secondary | ICD-10-CM | POA: Insufficient documentation

## 2024-03-23 DIAGNOSIS — C61 Malignant neoplasm of prostate: Secondary | ICD-10-CM | POA: Insufficient documentation

## 2024-03-23 DIAGNOSIS — C7801 Secondary malignant neoplasm of right lung: Secondary | ICD-10-CM | POA: Insufficient documentation

## 2024-03-23 DIAGNOSIS — C9110 Chronic lymphocytic leukemia of B-cell type not having achieved remission: Secondary | ICD-10-CM | POA: Insufficient documentation

## 2024-03-23 DIAGNOSIS — C7802 Secondary malignant neoplasm of left lung: Secondary | ICD-10-CM | POA: Insufficient documentation

## 2024-03-23 LAB — CBC WITH DIFFERENTIAL (CANCER CENTER ONLY)
Abs Immature Granulocytes: 0.03 K/uL (ref 0.00–0.07)
Basophils Absolute: 0 K/uL (ref 0.0–0.1)
Basophils Relative: 0 %
Eosinophils Absolute: 0.1 K/uL (ref 0.0–0.5)
Eosinophils Relative: 1 %
HCT: 40.5 % (ref 39.0–52.0)
Hemoglobin: 13.4 g/dL (ref 13.0–17.0)
Immature Granulocytes: 0 %
Lymphocytes Relative: 54 %
Lymphs Abs: 6 K/uL — ABNORMAL HIGH (ref 0.7–4.0)
MCH: 29.7 pg (ref 26.0–34.0)
MCHC: 33.1 g/dL (ref 30.0–36.0)
MCV: 89.8 fL (ref 80.0–100.0)
Monocytes Absolute: 0.5 K/uL (ref 0.1–1.0)
Monocytes Relative: 5 %
Neutro Abs: 4.4 K/uL (ref 1.7–7.7)
Neutrophils Relative %: 40 %
Platelet Count: 131 K/uL — ABNORMAL LOW (ref 150–400)
RBC: 4.51 MIL/uL (ref 4.22–5.81)
RDW: 14.6 % (ref 11.5–15.5)
WBC Count: 11 K/uL — ABNORMAL HIGH (ref 4.0–10.5)
nRBC: 0 % (ref 0.0–0.2)

## 2024-03-23 LAB — CMP (CANCER CENTER ONLY)
ALT: 15 U/L (ref 0–44)
AST: 14 U/L — ABNORMAL LOW (ref 15–41)
Albumin: 3.9 g/dL (ref 3.5–5.0)
Alkaline Phosphatase: 45 U/L (ref 38–126)
Anion gap: 7 (ref 5–15)
BUN: 19 mg/dL (ref 8–23)
CO2: 27 mmol/L (ref 22–32)
Calcium: 8.8 mg/dL — ABNORMAL LOW (ref 8.9–10.3)
Chloride: 102 mmol/L (ref 98–111)
Creatinine: 0.89 mg/dL (ref 0.61–1.24)
GFR, Estimated: >60 mL/min (ref >60)
Glucose, Bld: 104 mg/dL — ABNORMAL HIGH (ref 70–99)
Potassium: 4.1 mmol/L (ref 3.5–5.1)
Sodium: 136 mmol/L (ref 135–145)
Total Bilirubin: 0.5 mg/dL (ref 0.0–1.2)
Total Protein: 6.2 g/dL — ABNORMAL LOW (ref 6.5–8.1)

## 2024-03-23 LAB — PSA: Prostatic Specific Antigen: <0.02 ng/mL (ref 0.00–4.00)

## 2024-03-23 LAB — VITAMIN B12: Vitamin B-12: 1434 pg/mL — ABNORMAL HIGH (ref 180–914)

## 2024-03-28 ENCOUNTER — Other Ambulatory Visit: Payer: Self-pay | Admitting: Sports Medicine

## 2024-03-30 ENCOUNTER — Inpatient Hospital Stay

## 2024-03-30 ENCOUNTER — Encounter: Payer: Self-pay | Admitting: Oncology

## 2024-03-30 ENCOUNTER — Inpatient Hospital Stay: Admitting: Oncology

## 2024-03-30 VITALS — BP 139/73 | HR 65 | Temp 97.0°F | Resp 16 | Wt 201.0 lb

## 2024-03-30 DIAGNOSIS — L578 Other skin changes due to chronic exposure to nonionizing radiation: Secondary | ICD-10-CM | POA: Diagnosis not present

## 2024-03-30 DIAGNOSIS — L821 Other seborrheic keratosis: Secondary | ICD-10-CM | POA: Diagnosis not present

## 2024-03-30 DIAGNOSIS — C61 Malignant neoplasm of prostate: Secondary | ICD-10-CM

## 2024-03-30 DIAGNOSIS — Z79818 Long term (current) use of other agents affecting estrogen receptors and estrogen levels: Secondary | ICD-10-CM

## 2024-03-30 DIAGNOSIS — C9110 Chronic lymphocytic leukemia of B-cell type not having achieved remission: Secondary | ICD-10-CM | POA: Diagnosis not present

## 2024-03-30 DIAGNOSIS — R5383 Other fatigue: Secondary | ICD-10-CM

## 2024-03-30 DIAGNOSIS — L570 Actinic keratosis: Secondary | ICD-10-CM | POA: Diagnosis not present

## 2024-03-30 DIAGNOSIS — L814 Other melanin hyperpigmentation: Secondary | ICD-10-CM | POA: Diagnosis not present

## 2024-03-30 DIAGNOSIS — D229 Melanocytic nevi, unspecified: Secondary | ICD-10-CM | POA: Diagnosis not present

## 2024-03-30 MED ORDER — LEUPROLIDE ACETATE (6 MONTH) 45 MG ~~LOC~~ KIT
45.0000 mg | PACK | Freq: Once | SUBCUTANEOUS | Status: AC
  Administered 2024-03-30: 45 mg via SUBCUTANEOUS
  Filled 2024-03-30: qty 45

## 2024-03-30 NOTE — Assessment & Plan Note (Addendum)
 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 Previous lung nodule biopsy results are consistent with malignant cells, non-small cell carcinoma, not enough tissue for further work-up for tissue origin. Nodules are avid on PSMA, likely metastatic prostate cancer lung involvement.  Continue Adrogen deprivation therapy.-  next due Lupron  45mg  Q6 months - Oct 2025 Labs are reviewed and discussed with patient. PSA <0.02 Continue Apalutamide  240mg  daily

## 2024-03-30 NOTE — Assessment & Plan Note (Signed)
Diagnosed on 07/08/2012.  Currently on watchful waiting

## 2024-03-30 NOTE — Progress Notes (Signed)
 Hematology/Oncology Progress note [phone] [phone]    CHIEF COMPLAINTS/REASON FOR VISIT:  Prostate cancer, CLL  ASSESSMENT & PLAN:   Cancer Staging  CLL (chronic lymphocytic leukemia) (HCC) Staging form: Chronic Lymphocytic Leukemia / Small Lymphocytic Lymphoma, AJCC 8th Edition - Clinical stage from 01/10/2021: Modified Rai Stage 0 (Modified Rai risk: Low, Lymphocytosis: Present, Adenopathy: Absent, Organomegaly: Absent, Anemia: Absent, Thrombocytopenia: Absent) - Signed by Lonn Hicks, MD on 01/10/2021  Prostate cancer [facility]) Staging form: Prostate, AJCC 8th Edition - Clinical: Stage IVB (ycTX, ycNX, cM1) - Signed by Babara Call, MD on 10/08/2021   Prostate cancer [facility]) stage IV castration sensitive prostate cancer with multiple radiotracer avid lung nodules on PSMA. Previous lung nodule biopsy results are consistent with malignant cells, non-small cell carcinoma, not enough tissue for further work-up for tissue origin. Nodules are avid on PSMA, likely metastatic prostate cancer lung involvement.  Continue Adrogen deprivation therapy.-  next due Lupron  45mg  Q6 months - Oct 2025 Labs are reviewed and discussed with patient. PSA <0.02 Continue Apalutamide  240mg  daily   CLL (chronic lymphocytic leukemia) (HCC) Diagnosed on 07/08/2012.  Currently on watchful waiting  Fatigue B12 level has improved. recommend B12 1000 mcg weekly  Otc supply  Encounter for monitoring androgen deprivation therapy Eligard  injection today.  Next due April 2026.   Orders Placed This Encounter  Procedures   CBC with Differential (Cancer Center Only)    Standing Status:   Future    Expected Date:   09/28/2024    Expiration Date:   12/27/2024   CMP (Cancer Center only)    Standing Status:   Future    Expected Date:   09/28/2024    Expiration Date:   12/27/2024   PSA    Standing Status:   Future    Expected Date:   09/28/2024    Expiration Date:   12/27/2024     Follow-up in 6  months-patient's preference All questions were answered. The patient knows to call the clinic with any problems questions or concerns.   Call Babara, MD, PhD [facility] Hematology Oncology 03/30/2024    HISTORY OF PRESENTING ILLNESS:   Brian Bean is a  83 y.o.  male with PMH listed below was seen in consultation at the request of  Veludandi, Prashanthi, *  for evaluation of prostate cancer.  Patient's oncology care was previously with Dr. Amadeo Mitten.  Patient presents for second opinion and prefers to transfer care to our cancer center. I reviewed patient's previous oncology records.  Oncology History  CLL (chronic lymphocytic leukemia) (HCC)  07/08/2012 Pathology Results   Accession #: [id number]  peripheral blood comfirmed CLL   2015 Initial Diagnosis   #History of prostate cancer, initially diagnosed in June 2015, Gleason score of 6, patient has been on active surveillance between 2015-2019, PSA gradually increasing up to 16.9.   06/24/2016 Pathology Results   CLL FISH showed 3.67% positive for deletion 13 q and 5% positive for p53 deletion   01/10/2021 Cancer Staging   Staging form: Chronic Lymphocytic Leukemia / Small Lymphocytic Lymphoma, AJCC 8th Edition - Clinical stage from 01/10/2021: Modified Rai Stage 0 (Modified Rai risk: Low, Lymphocytosis: Present, Adenopathy: Absent, Organomegaly: Absent, Anemia: Absent, Thrombocytopenia: Absent) - Signed by Lonn Hicks, MD on 01/10/2021 Stage prefix: Initial diagnosis   07/02/2022 -  Chemotherapy   Started on apalutamide  240mg     Prostate cancer (HCC)  2015 Initial Diagnosis   #History of prostate cancer, initially diagnosed in June 2015, Gleason score of 6,  patient has been on active surveillance between 2015-2019, PSA gradually increasing up to 16.9.   06/24/2016 Initial Diagnosis   Prostate cancer (HCC)   12/28/2020 Imaging   12/28/2020, PSMA at [facility] showed multiple bilateral radiotracer avid pulmonary nodules.  For example right  upper lobe 1.2 x 1.4 cm and the left lower lobe 1.5 x 1.3 cm.  Prostate showed no focal radiotracer uptake to suggest local recurrence.  No PSMA avid pelvic, retroperitoneal, or mesenteric adenopathy.  Multiple pulmonary radiotracer avid nodules concerning for metastatic disease.  No osseous metastatic disease.  Severe three-vessel coronary artery calcifications   04/16/2021 Initial Biopsy   patient underwent biopsy via bronchoscopy. Lung R LL biopsy showed malignant cells consistent with non-small cell carcinoma. Lung RLL brushing showed malignant cells consistent with non-small cell carcinoma, Lung R UL brushing showed no malignant cells. Lung R UL biopsy showed malignant cells consistent with non-small cell carcinoma. Lung RUL lavage showed no malignant cells identified. Immunostains for TTF-1, Napsin A, p63 and CK5/6 were attempted but are noncontributory.  There is insufficient tumor tissue on the cellblock for additional studies.     05/2021 Tumor Marker   PSA 8.7   05/10/2021 Miscellaneous   05/10/2021, started on androgen deprivation therapy. Eligard  30 mg every 4 months   08/2021 Tumor Marker   PSA 0.2   10/08/2021 Cancer Staging   Staging form: Prostate, AJCC 8th Edition - Clinical: Stage IVB (ycTX, cNX, cM1) - Signed by Babara Call, MD on 10/08/2021 Stage prefix: Post-therapy   10/23/2021 Imaging   PSMA PET scan showed Small bilateral pulmonary metastases show intense radiopharmaceutical uptake, which show slight decrease in size since prior outside PET-CT. No new or progressive metastatic disease.   11/08/2021 Imaging   CT chest wo contrast showed the scattered pulmonary nodules are mildly reduced in size compared to 04/16/2021. No new nodules identified.   02/07/2022 Procedure   Status post left pleural effusion thoracentesis -cytology not diagnostic of malignancy.   07/02/2022 -  Chemotherapy   07/02/2022 apalutamide  240mg .     07/15/2022 Tumor Marker   PSA <0.01   08/13/2022  Tumor Marker   PSA <0.01   03/24/2023 Tumor Marker   PSA <0.01   09/19/2023 Imaging   CAT scan showed 1. Decreased size and radiotracer activity of pulmonary metastasis. 2. No evidence of local prostate carcinoma recurrence in the prostate gland. 3. No evidence of metastatic adenopathy in the pelvis or periaortic retroperitoneum. 4. No evidence of visceral metastasis or skeletal metastasis.    INTERVAL HISTORY Brian Bean is a 83 y.o. male who has above history reviewed by me today presents for follow up visit for prostate cancer. Patient reports feeling well.   07/02/2022 started on apalutamide  240mg  daily.  Overall he tolerates treatment with no significant side effects. Denies SOB, cough. Patient takes oral vitamin B12 supplementation.   Review of Systems  Constitutional:  Positive for fatigue. Negative for appetite change, chills, fever and unexpected weight change.  HENT:   Negative for hearing loss and voice change.   Eyes:  Negative for eye problems and icterus.  Respiratory:  Negative for chest tightness, cough and shortness of breath.   Cardiovascular:  Negative for chest pain and leg swelling.  Gastrointestinal:  Negative for abdominal distention and abdominal pain.  Endocrine: Positive for hot flashes.  Genitourinary:  Negative for difficulty urinating, dysuria and frequency.   Musculoskeletal:  Negative for arthralgias.  Skin:  Negative for itching and rash.  Neurological:  Negative for light-headedness  and numbness.  Hematological:  Negative for adenopathy. Does not bruise/bleed easily.  Psychiatric/Behavioral:  Negative for confusion.     MEDICAL HISTORY:  Past Medical History:  Diagnosis Date   Anxiety    Arthritis    Cancer (HCC)    skin lesion scalp   CLL (chronic lymphocytic leukemia) (HCC) 01/06/2013   CLL (chronic lymphocytic leukemia) (HCC)    Depression    Head injury    fell from a horse   Hypertension    Lymphocytosis    PONV  (postoperative nausea and vomiting)    extremely sick did not get sick after hip surgery in 2013   Prostate cancer [facility])     SURGICAL HISTORY: Past Surgical History:  Procedure Laterality Date   APPENDECTOMY     BRONCHIAL BIOPSY  04/16/2021   Procedure: BRONCHIAL BIOPSIES;  Surgeon: Brenna Adine CROME, DO;  Location: [facility];  Service: Pulmonary;;   BRONCHIAL BRUSHINGS  04/16/2021   Procedure: BRONCHIAL BRUSHINGS;  Surgeon: Brenna Adine CROME, DO;  Location: [facility];  Service: Pulmonary;;   BRONCHIAL NEEDLE ASPIRATION BIOPSY  04/16/2021   Procedure: BRONCHIAL NEEDLE ASPIRATION BIOPSIES;  Surgeon: Brenna Adine CROME, DO;  Location: [facility];  Service: Pulmonary;;   BRONCHIAL WASHINGS  04/16/2021   Procedure: BRONCHIAL WASHINGS;  Surgeon: Brenna Adine CROME, DO;  Location: [facility];  Service: Pulmonary;;   CATARACT EXTRACTION, BILATERAL     CERVICAL DISC SURGERY  2000   COLONOSCOPY  2011   [facility].    FRACTURE SURGERY     rt ankle   HERNIA REPAIR     rt and  undistended testicle   PROSTATE BIOPSY     REVERSE SHOULDER ARTHROPLASTY Right 05/01/2020   Procedure: REVERSE SHOULDER ARTHROPLASTY;  Surgeon: Edie Norleen PARAS, MD;  Location: [facility] ORS;  Service: Orthopedics;  Laterality: Right;   rt hip  2012   pinned   TOTAL HIP ARTHROPLASTY  05/03/2012   Procedure: TOTAL HIP ARTHROPLASTY;  Surgeon: Dempsey PARAS Sensor, MD;  Location: [facility] OR;  Service: Orthopedics;  Laterality: Right;   VIDEO BRONCHOSCOPY WITH ENDOBRONCHIAL NAVIGATION Right 04/16/2021   Procedure: VIDEO BRONCHOSCOPY WITH ENDOBRONCHIAL NAVIGATION;  Surgeon: Brenna Adine CROME, DO;  Location: [facility];  Service: Pulmonary;  Laterality: Right;  ION w/ CIOS   VIDEO BRONCHOSCOPY WITH RADIAL ENDOBRONCHIAL ULTRASOUND  04/16/2021   Procedure: RADIAL ENDOBRONCHIAL ULTRASOUND;  Surgeon: Brenna Adine CROME, DO;  Location: [facility];  Service: Pulmonary;;    SOCIAL HISTORY: Social History   Socioeconomic History   Marital  status: Divorced    Spouse name: Not on file   Number of children: 1   Years of education: Not on file   Highest education level: Not on file  Occupational History    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[employer])  Tobacco Use   Smoking status: Former    Current packs/day: 0.00    Average packs/day: 0.5 packs/day for 10.0 years (5.0 ttl pk-yrs)    Types: Cigarettes    Start date: 04/29/1967    Quit date: 04/28/1977    Years since quitting: 46.9   Smokeless tobacco: Never   Tobacco comments:    10 oz alcohol wkly  Vaping Use   Vaping status: Never Used  Substance and Sexual Activity   Alcohol use: Yes    Alcohol/week: 21.0 standard drinks of alcohol    Types: 5 Glasses of wine, 16 Shots of liquor per week    Comment: 14 ounces a week   Drug use: No  Sexual activity: Not on file  Other Topics Concern   Not on file  Social History Narrative   Retired.   Significant other: Shawnee Hoard   One son, Belvie      Tobacco use, amount per day now: 0   Past tobacco use, amount per day: 1/2-1 pack   How many years did you use tobacco: 12   Alcohol use (drinks per week): 7   Diet: Good   Do you drink/eat things with caffeine: Hell yes   Marital status: Widowed                                  What year were you married? 1966   Do you live in a house, apartment, assisted living, condo, trailer, etc.? House   Is it one or more stories? One   How many persons live in your home? 2   Do you have pets in your home?( please list) 2 Dogs, 1 Cat   Highest Level of education completed? Masters    Current or past profession: Sales   Do you exercise?  Yes                                Type and how often? Water  exercise   Do you have a living will? Yes   Do you have a DNR form?     Yes                              If not, do you want to discuss one?   Do you have signed POA/HPOA forms?  Yes                      If so, please bring to you appointment      Do you have any difficulty  bathing or dressing yourself? No   Do you have any difficulty preparing food or eating? No   Do you have any difficulty managing your medications? No   Do you have any difficulty managing your finances? No   Do you have any difficulty affording your medications? No   Social Drivers of Corporate investment banker Strain: Not on file  Food Insecurity: No Food Insecurity (09/16/2023)   Hunger Vital Sign    Worried About Running Out of Food in the Last Year: Never true    Ran Out of Food in the Last Year: Never true  Transportation Needs: No Transportation Needs (09/16/2023)   PRAPARE - Administrator, Civil Service (Medical): No    Lack of Transportation (Non-Medical): No  Physical Activity: Not on file  Stress: Not on file  Social Connections: Moderately Integrated (09/16/2023)   Social Connection and Isolation Panel    Frequency of Communication with Friends and Family: Three times a week    Frequency of Social Gatherings with Friends and Family: Three times a week    Attends Religious Services: More than 4 times per year    Active Member of Clubs or Organizations: Yes    Attends Banker Meetings: 1 to 4 times per year    Marital Status: Divorced  Intimate Partner Violence: Not At Risk (09/16/2023)   Humiliation, Afraid, Rape, and Kick questionnaire    Fear of Current or Ex-Partner: No  Emotionally Abused: No    Physically Abused: No    Sexually Abused: No    FAMILY HISTORY: Family History  Problem Relation Age of Onset   Heart disease Mother    Cancer Father        unk type of cancer    ALLERGIES:  has no known allergies.  MEDICATIONS:  Current Outpatient Medications  Medication Sig Dispense Refill   acetaminophen  (TYLENOL ) 500 MG tablet Take 500 mg by mouth as needed.     alendronate  (FOSAMAX ) 70 MG tablet Take 1 tablet (70 mg total) by mouth once a week. Take with a full glass of water  on an empty stomach. 12 tablet 3   amLODipine  (NORVASC ) 5 MG  tablet Take 2.5 mg by mouth daily.     apalutamide  (ERLEADA ) 60 MG tablet Take 4 tablets (240 mg total) by mouth daily. 120 tablet 6   Ascorbic Acid (VITAMIN C) 1000 MG tablet Take 1,000 mg by mouth daily.     aspirin  EC 81 MG tablet Take 81 mg by mouth daily. Swallow whole.     atorvastatin  (LIPITOR) 20 MG tablet Take 20 mg by mouth daily.     Baclofen  5 MG TABS Take 1 tablet (5 mg total) by mouth as needed. 30 tablet 3   carboxymethylcellulose (REFRESH PLUS) 0.5 % SOLN Place 1 drop into both eyes as needed.     carvedilol  (COREG ) 12.5 MG tablet TAKE ONE TABLET BY MOUTH TWICE A DAY WITH A MEAL 60 tablet 1   Cholecalciferol 125 MCG (5000 UT) TABS Take by mouth.     fluorouracil (EFUDEX) 5 % cream Apply 1 application  topically as needed (precancerous spots).     FLUoxetine  (PROZAC ) 40 MG capsule Take 1 capsule (40 mg total) by mouth daily.     hydrochlorothiazide  (HYDRODIURIL ) 12.5 MG tablet Take 1 tablet (12.5 mg total) by mouth daily. 90 tablet 1   ketoconazole (NIZORAL) 2 % shampoo Apply 1 application  topically daily as needed for irritation.     leuprolide , 6 Month, (ELIGARD ) 45 MG injection Inject 45 mg into the skin every 6 (six) months.     Lidocaine  1.8 % PTCH Place 1 patch onto the skin as needed. Remove & Discard patch within 12 hours or as directed by MD     lisinopril  (ZESTRIL ) 10 MG tablet TAKE ONE TABLET BY MOUTH ONE TIME DAILY 90 tablet 1   Misc. Devices (WRIST BRACE) MISC 1 Application by Does not apply route daily. 2 each 0   modafinil (PROVIGIL) 100 MG tablet Take 100 mg by mouth daily.     tamsulosin (FLOMAX) 0.4 MG CAPS capsule Take 0.4 mg by mouth daily.     fluticasone  (FLONASE ) 50 MCG/ACT nasal spray Place 1 spray into both nostrils as needed for allergies or rhinitis. (Patient not taking: Reported on 03/30/2024)     No current facility-administered medications for this visit.     PHYSICAL EXAMINATION: ECOG PERFORMANCE STATUS: 1 - Symptomatic but completely  ambulatory Vitals:   03/30/24 1405  BP: 139/73  Pulse: 65  Resp: 16  Temp: (!) 97 F (36.1 C)  SpO2: 97%    Filed Weights   03/30/24 1405  Weight: 201 lb (91.2 kg)     Physical Exam Constitutional:      General: He is not in acute distress. HENT:     Head: Normocephalic and atraumatic.  Eyes:     General: No scleral icterus. Cardiovascular:     Rate and Rhythm:  Normal rate and regular rhythm.     Heart sounds: Normal heart sounds.  Pulmonary:     Effort: Pulmonary effort is normal. No respiratory distress.     Breath sounds: No wheezing.  Abdominal:     General: Bowel sounds are normal. There is no distension.     Palpations: Abdomen is soft.  Musculoskeletal:        General: No deformity. Normal range of motion.     Cervical back: Normal range of motion and neck supple.  Skin:    General: Skin is warm and dry.     Findings: No erythema or rash.  Neurological:     Mental Status: He is alert and oriented to person, place, and time. Mental status is at baseline.     Cranial Nerves: No cranial nerve deficit.     Coordination: Coordination normal.  Psychiatric:        Mood and Affect: Mood normal.     LABORATORY DATA:  I have reviewed the data as listed Lab Results  Component Value Date   WBC 11.0 (H) 03/23/2024   HGB 13.4 03/23/2024   HCT 40.5 03/23/2024   MCV 89.8 03/23/2024   PLT 131 (L) 03/23/2024   Recent Labs    09/15/23 1419 12/15/23 1611 03/23/24 1517  NA 137 141 136  K 3.5 3.9 4.1  CL 99 107 102  CO2 28 25 27   GLUCOSE 84 101* 104*  BUN 25* 24* 19  CREATININE 0.99 0.68 0.89  CALCIUM  8.8* 9.0 8.8*  GFRNONAA >60 >60 >60  PROT 6.3* 6.5 6.2*  ALBUMIN 4.0 3.9 3.9  AST 17 16 14*  ALT 15 15 15   ALKPHOS 44 41 45  BILITOT 0.7 0.4 0.5

## 2024-03-30 NOTE — Assessment & Plan Note (Signed)
 Eligard  injection today.  Next due April 2026.

## 2024-03-30 NOTE — Assessment & Plan Note (Signed)
 B12 level has improved. recommend B12 1000 mcg weekly  Otc supply

## 2024-04-01 ENCOUNTER — Encounter: Payer: Self-pay | Admitting: Nurse Practitioner

## 2024-04-01 ENCOUNTER — Encounter: Admitting: Nurse Practitioner

## 2024-04-01 ENCOUNTER — Ambulatory Visit: Admitting: Nurse Practitioner

## 2024-04-01 VITALS — BP 132/80 | HR 69 | Temp 97.6°F | Resp 18 | Ht 73.0 in | Wt 207.8 lb

## 2024-04-01 DIAGNOSIS — C9110 Chronic lymphocytic leukemia of B-cell type not having achieved remission: Secondary | ICD-10-CM | POA: Diagnosis not present

## 2024-04-01 DIAGNOSIS — C7800 Secondary malignant neoplasm of unspecified lung: Secondary | ICD-10-CM | POA: Diagnosis not present

## 2024-04-01 DIAGNOSIS — I10 Essential (primary) hypertension: Secondary | ICD-10-CM

## 2024-04-01 DIAGNOSIS — C61 Malignant neoplasm of prostate: Secondary | ICD-10-CM

## 2024-04-01 DIAGNOSIS — E538 Deficiency of other specified B group vitamins: Secondary | ICD-10-CM

## 2024-04-01 DIAGNOSIS — E782 Mixed hyperlipidemia: Secondary | ICD-10-CM | POA: Diagnosis not present

## 2024-04-01 DIAGNOSIS — F331 Major depressive disorder, recurrent, moderate: Secondary | ICD-10-CM | POA: Diagnosis not present

## 2024-04-01 NOTE — Patient Instructions (Addendum)
 1.Stop at check out & schedule Annual Wellness Visit- prefers in person

## 2024-04-01 NOTE — Progress Notes (Signed)
 Careteam: Patient Care Team: Sherlynn Madden, MD as PCP - General (Internal Medicine) Liam Lerner, MD as Attending Physician (Orthopedic Surgery) Vertell Pont, RN as Oncology Nurse Navigator Babara Call, MD as Consulting Physician (Oncology)  PLACE OF SERVICE:  [facility]  Advanced Directive information Does Patient Have a Medical Advance Directive?: Yes, Type of Advance Directive: Out of facility DNR (pink MOST or yellow form), Does patient want to make changes to medical advance directive?: No - Patient declined  No Known Allergies  Chief Complaint  Patient presents with   Transitions Of Care    HPI:  Discussed the use of AI scribe software for clinical note transcription with the patient, who gave verbal consent to proceed.  History of Present Illness Brian Bean is an 83 year old male who presents for follow-up regarding vitamin B12 levels and medication management.  He is concerned about communication between his oncology team and the person managing his medications regarding his vitamin B12 levels. Recent labs indicated high B12 levels, but he was taking B12 to offset the negative effects of testosterone  suppression. He is currently taking 1000 mg of B12 daily. Six months ago, he had low B12 levels and now slightly elevated   He has a history of chronic lymphocytic leukemia and is followed by oncology for this condition. He also has a history of prostate cancer, which was treated with radiation and is currently in remission. The cancer has metastasized to the lung. No symptoms of shortness of breath or cough.  He experiences symptoms of low motivation and energy, which are managed with B12 and modafinil, prescribed by a behavioral health provider. He is also under the care of a psychiatrist and psychologist for anxiety and depression, managed with fluoxetine .  He has a history of hypertension, managed with amlodipine  2.5 mg daily, hydrochlorothiazide  12.5 mg, and  lisinopril  10 mg. He has been on this regimen since the passing of his first wife and questions if he has developed a tolerance to these medications.  He reports swelling in one leg due to a pin in the ankle and wears supportive socks. He also has a metal hip and shoulder, which contribute to joint pain managed with ibuprofen used sparingly.    Review of Systems:  Review of Systems  Constitutional:  Negative for chills, fever and weight loss.  HENT:  Negative for tinnitus.   Respiratory:  Negative for cough, sputum production and shortness of breath.   Cardiovascular:  Positive for leg swelling. Negative for chest pain and palpitations.  Gastrointestinal:  Negative for abdominal pain, constipation, diarrhea and heartburn.  Genitourinary:  Negative for dysuria, frequency and urgency.  Musculoskeletal:  Positive for joint pain. Negative for back pain, falls and myalgias.  Skin: Negative.   Neurological:  Negative for dizziness and headaches.  Psychiatric/Behavioral:  Negative for depression and memory loss. The patient does not have insomnia.    Past Medical History:  Diagnosis Date   Anxiety    Arthritis    Cancer (HCC)    skin lesion scalp   CLL (chronic lymphocytic leukemia) (HCC) 01/06/2013   CLL (chronic lymphocytic leukemia) (HCC)    Depression    Head injury    fell from a horse   Hypertension    Lymphocytosis    PONV (postoperative nausea and vomiting)    extremely sick did not get sick after hip surgery in 2013   Prostate cancer [facility])    Past Surgical History:  Procedure Laterality Date   APPENDECTOMY  BRONCHIAL BIOPSY  04/16/2021   Procedure: BRONCHIAL BIOPSIES;  Surgeon: Brenna Adine CROME, DO;  Location: [facility];  Service: Pulmonary;;   BRONCHIAL BRUSHINGS  04/16/2021   Procedure: BRONCHIAL BRUSHINGS;  Surgeon: Brenna Adine CROME, DO;  Location: [facility];  Service: Pulmonary;;   BRONCHIAL NEEDLE ASPIRATION BIOPSY  04/16/2021   Procedure: BRONCHIAL NEEDLE  ASPIRATION BIOPSIES;  Surgeon: Brenna Adine CROME, DO;  Location: [facility];  Service: Pulmonary;;   BRONCHIAL WASHINGS  04/16/2021   Procedure: BRONCHIAL WASHINGS;  Surgeon: Brenna Adine CROME, DO;  Location: [facility];  Service: Pulmonary;;   CATARACT EXTRACTION, BILATERAL     CERVICAL DISC SURGERY  2000   COLONOSCOPY  2011   [facility].    FRACTURE SURGERY     rt ankle   HERNIA REPAIR     rt and  undistended testicle   PROSTATE BIOPSY     REVERSE SHOULDER ARTHROPLASTY Right 05/01/2020   Procedure: REVERSE SHOULDER ARTHROPLASTY;  Surgeon: Edie Norleen PARAS, MD;  Location: [facility] ORS;  Service: Orthopedics;  Laterality: Right;   rt hip  2012   pinned   TOTAL HIP ARTHROPLASTY  05/03/2012   Procedure: TOTAL HIP ARTHROPLASTY;  Surgeon: Dempsey PARAS Sensor, MD;  Location: [facility] OR;  Service: Orthopedics;  Laterality: Right;   VIDEO BRONCHOSCOPY WITH ENDOBRONCHIAL NAVIGATION Right 04/16/2021   Procedure: VIDEO BRONCHOSCOPY WITH ENDOBRONCHIAL NAVIGATION;  Surgeon: Brenna Adine CROME, DO;  Location: [facility];  Service: Pulmonary;  Laterality: Right;  ION w/ CIOS   VIDEO BRONCHOSCOPY WITH RADIAL ENDOBRONCHIAL ULTRASOUND  04/16/2021   Procedure: RADIAL ENDOBRONCHIAL ULTRASOUND;  Surgeon: Brenna Adine CROME, DO;  Location: [facility];  Service: Pulmonary;;   Social History:   reports that he quit smoking about 46 years ago. His smoking use included cigarettes. He started smoking about 56 years ago. He has a 5 pack-year smoking history. He has never used smokeless tobacco. He reports current alcohol use of about 21.0 standard drinks of alcohol per week. He reports that he does not use drugs.  Family History  Problem Relation Age of Onset   Heart disease Mother    Cancer Father        unk type of cancer    Medications: Patient's Medications  New Prescriptions   No medications on file  Previous Medications   ACETAMINOPHEN  (TYLENOL ) 500 MG TABLET    Take 500 mg by mouth as needed.   ALENDRONATE  (FOSAMAX )  70 MG TABLET    Take 1 tablet (70 mg total) by mouth once a week. Take with a full glass of water  on an empty stomach.   AMLODIPINE  (NORVASC ) 5 MG TABLET    Take 2.5 mg by mouth daily.   APALUTAMIDE  (ERLEADA ) 60 MG TABLET    Take 4 tablets (240 mg total) by mouth daily.   ASCORBIC ACID (VITAMIN C) 1000 MG TABLET    Take 1,000 mg by mouth daily.   ASPIRIN  EC 81 MG TABLET    Take 81 mg by mouth daily. Swallow whole.   ATORVASTATIN  (LIPITOR) 20 MG TABLET    Take 20 mg by mouth daily.   BACLOFEN  5 MG TABS    Take 1 tablet (5 mg total) by mouth as needed.   CARBOXYMETHYLCELLULOSE (REFRESH PLUS) 0.5 % SOLN    Place 1 drop into both eyes as needed.   CARVEDILOL  (COREG ) 12.5 MG TABLET    TAKE ONE TABLET BY MOUTH TWICE A DAY WITH A MEAL   CHOLECALCIFEROL 125 MCG (5000 UT) TABS  Take by mouth.   FLUOROURACIL (EFUDEX) 5 % CREAM    Apply 1 application  topically as needed (precancerous spots).   FLUOXETINE  (PROZAC ) 40 MG CAPSULE    Take 1 capsule (40 mg total) by mouth daily.   FLUTICASONE  (FLONASE ) 50 MCG/ACT NASAL SPRAY    Place 1 spray into both nostrils as needed for allergies or rhinitis.   HYDROCHLOROTHIAZIDE  (HYDRODIURIL ) 12.5 MG TABLET    Take 1 tablet (12.5 mg total) by mouth daily.   KETOCONAZOLE (NIZORAL) 2 % SHAMPOO    Apply 1 application  topically daily as needed for irritation.   LEUPROLIDE , 6 MONTH, (ELIGARD ) 45 MG INJECTION    Inject 45 mg into the skin every 6 (six) months.   LIDOCAINE  1.8 % PTCH    Place 1 patch onto the skin as needed. Remove & Discard patch within 12 hours or as directed by MD   LIDOCAINE -MENTHOL , SPRAY, 4-1 % LIQD    Apply topically as needed.   LISINOPRIL  (ZESTRIL ) 10 MG TABLET    TAKE ONE TABLET BY MOUTH ONE TIME DAILY   MISC. DEVICES (WRIST BRACE) MISC    1 Application by Does not apply route daily.   MODAFINIL (PROVIGIL) 100 MG TABLET    Take 100 mg by mouth daily.   TAMSULOSIN (FLOMAX) 0.4 MG CAPS CAPSULE    Take 0.4 mg by mouth daily.  Modified Medications    No medications on file  Discontinued Medications   No medications on file    Physical Exam:  Vitals:   04/01/24 1348  BP: 132/80  Pulse: 69  Resp: 18  Temp: 97.6 F (36.4 C)  SpO2: 95%  Weight: 207 lb 12.8 oz (94.3 kg)  Height: 6' 1 (1.854 m)   Body mass index is 27.42 kg/m. Wt Readings from Last 3 Encounters:  04/01/24 207 lb 12.8 oz (94.3 kg)  03/30/24 201 lb (91.2 kg)  01/05/24 212 lb (96.2 kg)    Physical Exam Constitutional:      General: He is not in acute distress.    Appearance: He is well-developed. He is not diaphoretic.  HENT:     Head: Normocephalic and atraumatic.     Right Ear: External ear normal.     Left Ear: External ear normal.     Mouth/Throat:     Pharynx: No oropharyngeal exudate.  Eyes:     Conjunctiva/sclera: Conjunctivae normal.     Pupils: Pupils are equal, round, and reactive to light.  Cardiovascular:     Rate and Rhythm: Normal rate and regular rhythm.     Heart sounds: Normal heart sounds.  Pulmonary:     Effort: Pulmonary effort is normal.     Breath sounds: Normal breath sounds.  Abdominal:     General: Bowel sounds are normal.     Palpations: Abdomen is soft.  Musculoskeletal:        General: No tenderness.     Cervical back: Normal range of motion and neck supple.     Right lower leg: Edema present.     Left lower leg: Edema present.  Skin:    General: Skin is warm and dry.  Neurological:     Mental Status: He is alert and oriented to person, place, and time.     Labs reviewed: Basic Metabolic Panel: Recent Labs    08/11/23 1111 09/15/23 1419 12/15/23 1611 03/23/24 1517  NA  --  137 141 136  K  --  3.5 3.9 4.1  CL  --  99 107 102  CO2  --  28 25 27   GLUCOSE  --  84 101* 104*  BUN  --  25* 24* 19  CREATININE  --  0.99 0.68 0.89  CALCIUM   --  8.8* 9.0 8.8*  TSH 1.64  --   --   --    Liver Function Tests: Recent Labs    09/15/23 1419 12/15/23 1611 03/23/24 1517  AST 17 16 14*  ALT 15 15 15   ALKPHOS  44 41 45  BILITOT 0.7 0.4 0.5  PROT 6.3* 6.5 6.2*  ALBUMIN 4.0 3.9 3.9   No results for input(s): LIPASE, AMYLASE in the last 8760 hours. No results for input(s): AMMONIA in the last 8760 hours. CBC: Recent Labs    09/15/23 1419 12/15/23 1611 03/23/24 1517  WBC 10.6* 10.7* 11.0*  NEUTROABS 4.1 4.2 4.4  HGB 13.5 13.0 13.4  HCT 40.5 39.1 40.5  MCV 91.2 92.7 89.8  PLT 162 144* 131*  ( Lipid Panel: No results for input(s): CHOL, HDL, LDLCALC, TRIG, CHOLHDL, LDLDIRECT in the last 8760 hours. TSH: Recent Labs    08/11/23 1111  TSH 1.64   A1C: Lab Results  Component Value Date   HGBA1C 5.6 08/11/2023     Assessment/Plan  1. CLL (chronic lymphocytic leukemia) (HCC) (Primary) Followed by urology   2. Vitamin B12 deficiency Mildly elevated, encouraged to decrease b12 supplement to 500 mcg daily   3. Malignant neoplasm of prostate metastatic to lung [facility]) Followed by oncology and urology   4. Moderate episode of recurrent major depressive disorder (HCC) Symptoms controlled with fluoxetine  and therapy, occasional episodes. - Continue current regimen of fluoxetine  and therapy.  5. Primary hypertension Well controlled on current regimen.  He is taking lisinopril , norvasc , coreg , and hydrochlorothiazide   6. Mixed hyperlipidemia Needs updated lipids, will get when we check labs in office.  Continues on lipitor  Assessment and Plan  Return in about 4 months (around 08/02/2024) for routine follow up.:  Chantee Cerino K. Caro BODILY [facility] [phone]

## 2024-04-02 DIAGNOSIS — E538 Deficiency of other specified B group vitamins: Secondary | ICD-10-CM | POA: Insufficient documentation

## 2024-04-02 DIAGNOSIS — F331 Major depressive disorder, recurrent, moderate: Secondary | ICD-10-CM | POA: Insufficient documentation

## 2024-04-02 NOTE — Progress Notes (Signed)
 This encounter was created in error - please disregard.

## 2024-04-20 ENCOUNTER — Ambulatory Visit: Admitting: Nurse Practitioner

## 2024-04-26 ENCOUNTER — Ambulatory Visit: Admitting: Sports Medicine

## 2024-04-27 ENCOUNTER — Other Ambulatory Visit: Payer: Self-pay | Admitting: Sports Medicine

## 2024-04-27 DIAGNOSIS — I10 Essential (primary) hypertension: Secondary | ICD-10-CM

## 2024-05-04 ENCOUNTER — Other Ambulatory Visit: Payer: Self-pay

## 2024-05-04 DIAGNOSIS — C61 Malignant neoplasm of prostate: Secondary | ICD-10-CM

## 2024-05-04 MED ORDER — APALUTAMIDE 60 MG PO TABS: 240.0000 mg | ORAL_TABLET | Freq: Every day | ORAL | 6 refills | Status: AC

## 2024-05-16 ENCOUNTER — Ambulatory Visit: Payer: Self-pay | Admitting: Nurse Practitioner

## 2024-05-18 ENCOUNTER — Other Ambulatory Visit (HOSPITAL_COMMUNITY): Payer: Self-pay

## 2024-05-18 ENCOUNTER — Telehealth: Payer: Self-pay | Admitting: Pharmacy Technician

## 2024-05-18 NOTE — Telephone Encounter (Signed)
 Oral Oncology Patient Advocate Encounter   Began application for assistance for Erleada  through [facility] Patient [facility].   Application will be submitted upon completion of necessary supporting documentation.   [employer] phone number [phone].   Waiting on MD/patient signatures.   Mitsugi Schrader (Patty) Chet Burnet, CPhT  [facility], [place] [place], [facility] - Oral Chemotherapy Patient Advocate Specialist III Phone: [phone]  Fax: [phone]

## 2024-05-19 NOTE — Telephone Encounter (Signed)
 Oral Oncology Patient Advocate Encounter   Submitted application for assistance for Brian Bean  to [facility] Patient [name].   Application submitted via e-fax to [phone]   [employer] phone number [phone].   I will continue to check the status until final determination.   Wynema Garoutte (Patty) Chet Burnet, CPhT  [facility], [place] [place], [facility] - Oral Chemotherapy Patient Advocate Specialist III Phone: [phone]  Fax: [phone]

## 2024-05-23 ENCOUNTER — Ambulatory Visit: Admitting: Nurse Practitioner

## 2024-05-27 ENCOUNTER — Other Ambulatory Visit: Payer: Self-pay

## 2024-05-27 ENCOUNTER — Encounter: Payer: Self-pay | Admitting: Nurse Practitioner

## 2024-05-27 DIAGNOSIS — C9110 Chronic lymphocytic leukemia of B-cell type not having achieved remission: Secondary | ICD-10-CM

## 2024-05-27 MED ORDER — CARVEDILOL 12.5 MG PO TABS: 12.5000 mg | ORAL_TABLET | Freq: Two times a day (BID) | ORAL | 3 refills | Status: AC

## 2024-05-27 MED ORDER — FLUOXETINE HCL 40 MG PO CAPS: 40.0000 mg | ORAL_CAPSULE | Freq: Every day | ORAL | 1 refills | Status: AC

## 2024-05-27 MED ORDER — LISINOPRIL 10 MG PO TABS: 10.0000 mg | ORAL_TABLET | Freq: Every day | ORAL | 1 refills | Status: AC

## 2024-06-03 ENCOUNTER — Encounter: Payer: Self-pay | Admitting: Family

## 2024-06-03 ENCOUNTER — Telehealth: Payer: Self-pay

## 2024-06-03 ENCOUNTER — Ambulatory Visit (INDEPENDENT_AMBULATORY_CARE_PROVIDER_SITE_OTHER): Admitting: Family

## 2024-06-03 VITALS — BP 122/62 | HR 80 | Temp 97.4°F | Ht 73.0 in | Wt 209.0 lb

## 2024-06-03 DIAGNOSIS — Z0000 Encounter for general adult medical examination without abnormal findings: Secondary | ICD-10-CM

## 2024-06-03 NOTE — Progress Notes (Signed)
 "  Chief Complaint  Patient presents with   Medicare Wellness    Patient presents today for annual wellness visit. No care gaps to discuss today.      Subjective:   Brian Bean is a 83 y.o. male who presents for a Medicare Annual Wellness Visit.  Visit info / Clinical Intake: Medicare Wellness Visit Type:: Subsequent Annual Wellness Visit Persons participating in visit and providing information:: patient Medicare Wellness Visit Mode:: In-person (required for WTM) Interpreter Needed?: No Pre-visit prep was completed: no AWV questionnaire completed by patient prior to visit?: no Living arrangements:: lives with spouse/significant other Patient's Overall Health Status Rating: good Typical amount of pain: some (right hand and lower back pain) Does pain affect daily life?: (!) yes (unable to lift > 60 lbs) Are you currently prescribed opioids?: no  Dietary Habits and Nutritional Risks How many meals a day?: 2 (1-2 snacks) Eats fruit and vegetables daily?: yes Most meals are obtained by: preparing own meals (eats out twice a month) In the last 2 weeks, have you had any of the following?: none Diabetic:: no  Functional Status Activities of Daily Living (to include ambulation/medication): Independent Ambulation: Independent Medication Administration: Independent Home Management (perform basic housework or laundry): Independent Manage your own finances?: yes Primary transportation is: driving Concerns about vision?: (!) yes (wears eye glasses.Has double vision perpheral  follows up Opthalmology) Concerns about hearing?: (!) yes Uses hearing aids?: (!) yes Hear whispered voice?: yes  Fall Screening Falls in the past year?: 0 Number of falls in past year: 0 Was there an injury with Fall?: 0 Fall Risk Category Calculator: 0 Patient Fall Risk Level: Low Fall Risk  Fall Risk Patient at Risk for Falls Due to: No Fall Risks Fall risk Follow up: Falls prevention discussed  Home  and Transportation Safety: All rugs have non-skid backing?: yes All stairs or steps have railings?: yes Grab bars in the bathtub or shower?: yes Have non-skid surface in bathtub or shower?: yes Good home lighting?: yes Regular seat belt use?: yes Hospital stays in the last year:: no  Cognitive Assessment Difficulty concentrating, remembering, or making decisions? : yes (Remembering) Will 6CIT or Mini Cog be Completed: no 6CIT or Mini Cog Declined: patient declined  Advance Directives (For Healthcare) Does Patient Have a Medical Advance Directive?: Yes Does patient want to make changes to medical advance directive?: No - Patient declined Type of Advance Directive: Living will; Healthcare Power of Attorney Copy of Healthcare Power of Attorney in Chart?: No - copy requested Copy of Living Will in Chart?: No - copy requested Out of facility DNR (pink MOST or yellow form) in Chart? (Ambulatory ONLY): Yes - validated most recent copy scanned in chart  Reviewed/Updated  Reviewed/Updated: Reviewed All (Medical, Surgical, Family, Medications, Allergies, Care Teams, Patient Goals)    Allergies (verified) Patient has no known allergies.   Current Medications (verified) Outpatient Encounter Medications as of 06/03/2024  Medication Sig   acetaminophen  (TYLENOL ) 500 MG tablet Take 500 mg by mouth as needed.   alendronate  (FOSAMAX ) 70 MG tablet Take 1 tablet (70 mg total) by mouth once a week. Take with a full glass of water  on an empty stomach.   amLODipine  (NORVASC ) 5 MG tablet Take 2.5 mg by mouth daily. (Patient taking differently: Take 2.5 mg by mouth daily. 1 tab 2 times per day)   apalutamide  (ERLEADA ) 60 MG tablet Take 4 tablets (240 mg total) by mouth daily.   Ascorbic Acid (VITAMIN C) 1000 MG tablet Take  1,000 mg by mouth daily.   aspirin  EC 81 MG tablet Take 81 mg by mouth daily. Swallow whole.   atorvastatin  (LIPITOR) 20 MG tablet Take 20 mg by mouth daily.   Baclofen  5 MG TABS  Take 5 mg by mouth as needed (PRN).   carboxymethylcellulose (REFRESH PLUS) 0.5 % SOLN Place 1 drop into both eyes as needed.   carvedilol  (COREG ) 12.5 MG tablet Take 1 tablet (12.5 mg total) by mouth 2 (two) times daily with a meal.   Cholecalciferol 125 MCG (5000 UT) TABS Take by mouth.   cyanocobalamin  (VITAMIN B12) 1000 MCG tablet Take 1,000 mcg by mouth every other day.   fluorouracil (EFUDEX) 5 % cream Apply 1 application  topically as needed (precancerous spots).   FLUoxetine  (PROZAC ) 40 MG capsule Take 1 capsule (40 mg total) by mouth daily.   fluticasone  (FLONASE ) 50 MCG/ACT nasal spray Place 1 spray into both nostrils as needed for allergies or rhinitis.   hydrochlorothiazide  (HYDRODIURIL ) 12.5 MG tablet Take 1 tablet (12.5 mg total) by mouth daily.   ketoconazole (NIZORAL) 2 % shampoo Apply 1 application  topically daily as needed for irritation.   leuprolide , 6 Month, (ELIGARD ) 45 MG injection Inject 45 mg into the skin every 6 (six) months.   Lidocaine  1.8 % PTCH Place 1 patch onto the skin as needed. Remove & Discard patch within 12 hours or as directed by MD   Lidocaine -Menthol , Spray, 4-1 % LIQD Apply topically as needed.   lisinopril  (ZESTRIL ) 10 MG tablet Take 1 tablet (10 mg total) by mouth daily.   Misc. Devices (WRIST BRACE) MISC 1 Application by Does not apply route daily.   modafinil (PROVIGIL) 100 MG tablet Take 100 mg by mouth daily.   Multiple Vitamin (SM STRESS B/BIOTIN PO) Take 5,000 mcg by mouth every other day.   tamsulosin (FLOMAX) 0.4 MG CAPS capsule Take 0.4 mg by mouth daily.   [DISCONTINUED] Baclofen  5 MG TABS Take 1 tablet (5 mg total) by mouth as needed.   No facility-administered encounter medications on file as of 06/03/2024.    History: Past Medical History:  Diagnosis Date   Anxiety    Arthritis    Cancer (HCC)    skin lesion scalp   CLL (chronic lymphocytic leukemia) (HCC) 01/06/2013   CLL (chronic lymphocytic leukemia) (HCC)    Depression     Head injury    fell from a horse   Hypertension    Lymphocytosis    PONV (postoperative nausea and vomiting)    extremely sick did not get sick after hip surgery in 2013   Prostate cancer [facility])    Past Surgical History:  Procedure Laterality Date   APPENDECTOMY     BRONCHIAL BIOPSY  04/16/2021   Procedure: BRONCHIAL BIOPSIES;  Surgeon: Brenna Adine CROME, DO;  [facility]: [facility];  Service: Pulmonary;;   BRONCHIAL BRUSHINGS  04/16/2021   Procedure: BRONCHIAL BRUSHINGS;  Surgeon: Brenna Adine CROME, DO;  Location: [facility];  Service: Pulmonary;;   BRONCHIAL NEEDLE ASPIRATION BIOPSY  04/16/2021   Procedure: BRONCHIAL NEEDLE ASPIRATION BIOPSIES;  Surgeon: Brenna Adine CROME, DO;  Location: [facility];  Service: Pulmonary;;   BRONCHIAL WASHINGS  04/16/2021   Procedure: BRONCHIAL WASHINGS;  Surgeon: Brenna Adine CROME, DO;  Location: [facility];  Service: Pulmonary;;   CATARACT EXTRACTION, BILATERAL     CERVICAL DISC SURGERY  2000   COLONOSCOPY  2011   [facility].    FRACTURE SURGERY     rt ankle  HERNIA REPAIR     rt and  undistended testicle   PROSTATE BIOPSY     REVERSE SHOULDER ARTHROPLASTY Right 05/01/2020   Procedure: REVERSE SHOULDER ARTHROPLASTY;  Surgeon: Edie Norleen PARAS, MD;  Location: [facility] ORS;  Service: Orthopedics;  Laterality: Right;   rt hip  2012   pinned   TOTAL HIP ARTHROPLASTY  05/03/2012   Procedure: TOTAL HIP ARTHROPLASTY;  Surgeon: Dempsey PARAS Sensor, MD;  Location: [facility];  Service: Orthopedics;  Laterality: Right;   VIDEO BRONCHOSCOPY WITH ENDOBRONCHIAL NAVIGATION Right 04/16/2021   Procedure: VIDEO BRONCHOSCOPY WITH ENDOBRONCHIAL NAVIGATION;  Surgeon: Brenna Adine CROME, DO;  Location: [facility];  Service: Pulmonary;  Laterality: Right;  ION w/ CIOS   VIDEO BRONCHOSCOPY WITH RADIAL ENDOBRONCHIAL ULTRASOUND  04/16/2021   Procedure: RADIAL ENDOBRONCHIAL ULTRASOUND;  Surgeon: Brenna Adine CROME, DO;  Location: [facility];  Service: Pulmonary;;   Family History   Problem Relation Age of Onset   Heart disease Mother    Cancer Father        unk type of cancer   Social History   Occupational History    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[employer])  Tobacco Use   Smoking status: Former    Current packs/day: 0.00    Average packs/day: 0.5 packs/day for 10.0 years (5.0 ttl pk-yrs)    Types: Cigarettes    Start date: 04/29/1967    Quit date: 04/28/1977    Years since quitting: 47.1   Smokeless tobacco: Never   Tobacco comments:    10 oz alcohol wkly  Vaping Use   Vaping status: Never Used  Substance and Sexual Activity   Alcohol use: Yes    Alcohol/week: 21.0 standard drinks of alcohol    Types: 5 Glasses of wine, 16 Shots of liquor per week    Comment: 14 ounces a week   Drug use: No   Sexual activity: Not on file   Tobacco Counseling Counseling given: Not Answered Tobacco comments: 10 oz alcohol wkly  SDOH Screenings   Food Insecurity: No Food Insecurity (06/03/2024)  Housing: Low Risk (06/03/2024)  Transportation Needs: Unknown (06/03/2024)  Utilities: Not At Risk (06/03/2024)  Depression (PHQ2-9): Low Risk (06/03/2024)  Physical Activity: Insufficiently Active (06/03/2024)  Social Connections: Socially Integrated (06/03/2024)  Stress: No Stress Concern Present (06/03/2024)  Tobacco Use: Medium Risk (06/03/2024)  Health Literacy: Adequate Health Literacy (06/03/2024)   See flowsheets for full screening details  Depression Screen PHQ 2 & 9 Depression Scale- Over the past 2 weeks, how often have you been bothered by any of the following problems? Little interest or pleasure in doing things: 0 Feeling down, depressed, or hopeless (PHQ Adolescent also includes...irritable): 0 PHQ-2 Total Score: 0 Trouble falling or staying asleep, or sleeping too much: 0 Feeling tired or having little energy: 0 Poor appetite or overeating (PHQ Adolescent also includes...weight loss): 0 Feeling bad about yourself - or that you are a  failure or have let yourself or your family down: 0 Trouble concentrating on things, such as reading the newspaper or watching television (PHQ Adolescent also includes...like school work): 0 Moving or speaking so slowly that other people could have noticed. Or the opposite - being so fidgety or restless that you have been moving around a lot more than usual: 0 Thoughts that you would be better off dead, or of hurting yourself in some way: 0 PHQ-9 Total Score: 0 If you checked off any problems, how difficult have these problems made it for you to do your  work, take care of things at home, or get along with other people?: Somewhat difficult  Depression Treatment Depression Interventions/Treatment : Medication     Goals Addressed             This Visit's Progress    Patient Stated       Get physical exercise at the [facility] at least three times per week              Objective:    Today's Vitals   06/03/24 1032  BP: 122/62  Pulse: 80  Temp: (!) 97.4 F (36.3 C)  SpO2: 97%  Weight: 209 lb (94.8 kg)  Height: 6' 1 (1.854 m)   Body mass index is 27.57 kg/m.  Hearing/Vision screen Hearing Screening - Comments:: 2 weeks ago per patient.  Vision Screening - Comments:: 6 months ago last exam. Immunizations and Health Maintenance Health Maintenance  Topic Date Due   COVID-19 Vaccine (8 - Pfizer risk 2025-26 season) 08/24/2024   Medicare Annual Wellness (AWV)  06/03/2025   DTaP/Tdap/Td (2 - Td or Tdap) 10/20/2033   Pneumococcal Vaccine: 50+ Years  Completed   Zoster Vaccines- Shingrix  Completed   Meningococcal B Vaccine  Aged Out        Assessment/Plan:  This is a routine wellness examination for Brian Bean.  Patient Care Team: Caro Harlene POUR, NP as PCP - General (Geriatric Medicine) Liam Lerner, MD as Attending Physician (Orthopedic Surgery) Vertell Pont, RN as Oncology Nurse Navigator Babara Call, MD as Consulting Physician (Oncology)  I have personally reviewed and  noted the following in the patients chart:   Medical and social history Use of alcohol, tobacco or illicit drugs  Current medications and supplements including opioid prescriptions. Functional ability and status Nutritional status Physical activity Advanced directives List of other physicians Hospitalizations, surgeries, and ER visits in previous 12 months Vitals Screenings to include cognitive, depression, and falls Referrals and appointments  No orders of the defined types were placed in this encounter.  In addition, I have reviewed and discussed with patient certain preventive protocols, quality metrics, and best practice recommendations. A written personalized care plan for preventive services as well as general preventive health recommendations were provided to patient.   Brian Bean Claudeen Leason, NP   06/03/2024   Return in 1 year (on 06/03/2025).  After Visit Summary: (In Person-Printed) AVS printed and given to the patient  Nurse Notes: Up to date  "

## 2024-06-03 NOTE — Telephone Encounter (Signed)
 Patient was in office today to see Dinah and asked to speak with our practice administrator to express dissatisfaction about his refills being sent to [facility] versus [facility] (as he had requested), refer to mychart message dated 05/27/24, titled (new pharmacy and refills).  Manuelita Shall, had informed patient that we would share this situation with the staff to heighten everyone's awareness about assuring prescriptions go to the correct pharmacy in the future.   I was given the information to share in huddle, however I asked Manuelita and the medical assistant working with Roxan today ,had he picked [facility] rx[facility] at [facility]? Or is he needing them submitted to [facility] at this time, Manuelita suggested that I call patient to clarify.  Left message on voicemail for patient to return call when available     E2C2 can ask patient bolded question above when call returned.

## 2024-06-03 NOTE — Patient Instructions (Signed)
 [name] , Thank you for taking time to come for your Medicare Wellness Visit. I appreciate your ongoing commitment to your health goals. Please review the following plan we discussed and let me know if I can assist you in the future.   Screening recommendations/referrals: Colonoscopy  Recommended yearly ophthalmology/optometry visit for glaucoma screening and checkup Recommended yearly dental visit for hygiene and checkup  Vaccinations: Influenza vaccine due annually in September/October Pneumococcal vaccine : Up to date  Tdap vaccine  : Up to date  Shingles vaccine  : Up to date     Advanced directives:   Conditions/risks identified:   Next appointment: 1 year   Preventive Care 63 Years and Older, Male Preventive care refers to lifestyle choices and visits with your health care provider that can promote health and wellness. What does preventive care include? A yearly physical exam. This is also called an annual well check. Dental exams once or twice a year. Routine eye exams. Ask your health care provider how often you should have your eyes checked. Personal lifestyle choices, including: Daily care of your teeth and gums. Regular physical activity. Eating a healthy diet. Avoiding tobacco and drug use. Limiting alcohol use. Practicing safe sex. Taking low doses of aspirin  every day. Taking vitamin and mineral supplements as recommended by your health care provider. What happens during an annual well check? The services and screenings done by your health care provider during your annual well check will depend on your age, overall health, lifestyle risk factors, and family history of disease. Counseling  Your health care provider may ask you questions about your: Alcohol use. Tobacco use. Drug use. Emotional well-being. Home and relationship well-being. Sexual activity. Eating habits. History of falls. Memory and ability to understand (cognition). Work and work  astronomer. Screening  You may have the following tests or measurements: Height, weight, and BMI. Blood pressure. Lipid and cholesterol levels. These may be checked every 5 years, or more frequently if you are over 60 years old. Skin check. Lung cancer screening. You may have this screening every year starting at age 69 if you have a 30-pack-year history of smoking and currently smoke or have quit within the past 15 years. Fecal occult blood test (FOBT) of the stool. You may have this test every year starting at age 77. Flexible sigmoidoscopy or colonoscopy. You may have a sigmoidoscopy every 5 years or a colonoscopy every 10 years starting at age 42. Prostate cancer screening. Recommendations will vary depending on your family history and other risks. Hepatitis C blood test. Hepatitis B blood test. Sexually transmitted disease (STD) testing. Diabetes screening. This is done by checking your blood sugar (glucose) after you have not eaten for a while (fasting). You may have this done every 1-3 years. Abdominal aortic aneurysm (AAA) screening. You may need this if you are a current or former smoker. Osteoporosis. You may be screened starting at age 85 if you are at high risk. Talk with your health care provider about your test results, treatment options, and if necessary, the need for more tests. Vaccines  Your health care provider may recommend certain vaccines, such as: Influenza vaccine. This is recommended every year. Tetanus, diphtheria, and acellular pertussis (Tdap, Td) vaccine. You may need a Td booster every 10 years. Zoster vaccine. You may need this after age 44. Pneumococcal 13-valent conjugate (PCV13) vaccine. One dose is recommended after age 44. Pneumococcal polysaccharide (PPSV23) vaccine. One dose is recommended after age 32. Talk to your health care  provider about which screenings and vaccines you need and how often you need them. This information is not intended to replace  advice given to you by your health care provider. Make sure you discuss any questions you have with your health care provider. Document Released: 06/22/2015 Document Revised: 02/13/2016 Document Reviewed: 03/27/2015 Elsevier Interactive Patient Education  2017 [employer].  Fall Prevention in the Home Falls can cause injuries. They can happen to people of all ages. There are many things you can do to make your home safe and to help prevent falls. What can I do on the outside of my home? Regularly fix the edges of walkways and driveways and fix any cracks. Remove anything that might make you trip as you walk through a door, such as a raised step or threshold. Trim any bushes or trees on the path to your home. Use bright outdoor lighting. Clear any walking paths of anything that might make someone trip, such as rocks or tools. Regularly check to see if handrails are loose or broken. Make sure that both sides of any steps have handrails. Any raised decks and porches should have guardrails on the edges. Have any leaves, snow, or ice cleared regularly. Use sand or salt on walking paths during winter. Clean up any spills in your garage right away. This includes oil or grease spills. What can I do in the bathroom? Use night lights. Install grab bars by the toilet and in the tub and shower. Do not use towel bars as grab bars. Use non-skid mats or decals in the tub or shower. If you need to sit down in the shower, use a plastic, non-slip stool. Keep the floor dry. Clean up any water  that spills on the floor as soon as it happens. Remove soap buildup in the tub or shower regularly. Attach bath mats securely with double-sided non-slip rug tape. Do not have throw rugs and other things on the floor that can make you trip. What can I do in the bedroom? Use night lights. Make sure that you have a light by your bed that is easy to reach. Do not use any sheets or blankets that are too big for your bed.  They should not hang down onto the floor. Have a firm chair that has side arms. You can use this for support while you get dressed. Do not have throw rugs and other things on the floor that can make you trip. What can I do in the kitchen? Clean up any spills right away. Avoid walking on wet floors. Keep items that you use a lot in easy-to-reach places. If you need to reach something above you, use a strong step stool that has a grab bar. Keep electrical cords out of the way. Do not use floor polish or wax that makes floors slippery. If you must use wax, use non-skid floor wax. Do not have throw rugs and other things on the floor that can make you trip. What can I do with my stairs? Do not leave any items on the stairs. Make sure that there are handrails on both sides of the stairs and use them. Fix handrails that are broken or loose. Make sure that handrails are as long as the stairways. Check any carpeting to make sure that it is firmly attached to the stairs. Fix any carpet that is loose or worn. Avoid having throw rugs at the top or bottom of the stairs. If you do have throw rugs, attach them to the  floor with carpet tape. Make sure that you have a light switch at the top of the stairs and the bottom of the stairs. If you do not have them, ask someone to add them for you. What else can I do to help prevent falls? Wear shoes that: Do not have high heels. Have rubber bottoms. Are comfortable and fit you well. Are closed at the toe. Do not wear sandals. If you use a stepladder: Make sure that it is fully opened. Do not climb a closed stepladder. Make sure that both sides of the stepladder are locked into place. Ask someone to hold it for you, if possible. Clearly mark and make sure that you can see: Any grab bars or handrails. First and last steps. Where the edge of each step is. Use tools that help you move around (mobility aids) if they are needed. These  include: Canes. Walkers. Scooters. Crutches. Turn on the lights when you go into a dark area. Replace any light bulbs as soon as they burn out. Set up your furniture so you have a clear path. Avoid moving your furniture around. If any of your floors are uneven, fix them. If there are any pets around you, be aware of where they are. Review your medicines with your doctor. Some medicines can make you feel dizzy. This can increase your chance of falling. Ask your doctor what other things that you can do to help prevent falls. This information is not intended to replace advice given to you by your health care provider. Make sure you discuss any questions you have with your health care provider. Document Released: 03/22/2009 Document Revised: 11/01/2015 Document Reviewed: 06/30/2014 Elsevier Interactive Patient Education  2017 [employer].

## 2024-06-03 NOTE — Telephone Encounter (Signed)
 Patient stated that he already picked up his medication and expressed how grateful he is that Ms. Shurray reached out to him to make sure this matter is resolved.

## 2024-06-13 ENCOUNTER — Telehealth: Payer: Self-pay | Admitting: Pharmacy Technician

## 2024-06-13 NOTE — Telephone Encounter (Signed)
" ° °  Erleada  approval effective 06/09/24 for 1 year through [employer] "

## 2024-06-21 NOTE — Telephone Encounter (Signed)
 Oral Oncology Patient Advocate Encounter   Received notification that the application for assistance for erleada  through [employer] has been approved.   [employer] phone number [phone].   Effective dates: 06/09/2024 through 06/08/2025  Medication will be filled at [facility].  I have spoken to the patient.  Vanessia Bokhari (Patty) Chet Burnet, CPhT  [facility], [place]da [place]mon, [facility] - Oral Chemotherapy Patient Advocate Specialist III Phone: [phone]  Fax: [phone]

## 2024-06-24 ENCOUNTER — Encounter: Payer: Self-pay | Admitting: Nurse Practitioner

## 2024-06-29 ENCOUNTER — Inpatient Hospital Stay: Admitting: Oncology

## 2024-06-29 ENCOUNTER — Inpatient Hospital Stay

## 2024-07-09 ENCOUNTER — Encounter: Payer: Self-pay | Admitting: Nurse Practitioner

## 2024-07-11 MED ORDER — MODAFINIL 100 MG PO TABS: 100.0000 mg | ORAL_TABLET | Freq: Every day | ORAL | 0 refills | Status: DC

## 2024-07-11 MED ORDER — AMLODIPINE BESYLATE 5 MG PO TABS: 2.5000 mg | ORAL_TABLET | Freq: Every day | ORAL | 0 refills | Status: DC

## 2024-07-11 MED ORDER — MODAFINIL 100 MG PO TABS: 100.0000 mg | ORAL_TABLET | Freq: Every day | ORAL | 0 refills | Status: AC

## 2024-07-15 ENCOUNTER — Telehealth: Payer: Self-pay

## 2024-07-15 ENCOUNTER — Telehealth: Payer: Self-pay | Admitting: Nurse Practitioner

## 2024-07-15 MED ORDER — AMLODIPINE BESYLATE 5 MG PO TABS: 5.0000 mg | ORAL_TABLET | Freq: Two times a day (BID) | ORAL | 0 refills | Status: AC

## 2024-07-15 NOTE — Telephone Encounter (Signed)
 Spoke with patient to notify of the response to Eubanks, Jessica K, NP and that the medication for amlodipine  5 mg has been sent to the pharmacy and understand the instructions of taking the medication. Patient had no question at this time.   Message sent to Caro Harlene POUR, NP

## 2024-07-15 NOTE — Telephone Encounter (Signed)
 Pt walked into office on 07/14/2024 and asked to talk to administration regarding medication. Apparently he has been taking norvasc  5 mg by mouth twice daily and never changed medication despite being advised by prior provider to half tablet daily.  His blood pressure has been well controlled and not having any issues on norvasc  5 mg by mouth twice daily. Requesting a refill on medication- will change in our system and send refill.

## 2024-08-08 ENCOUNTER — Ambulatory Visit: Payer: Self-pay | Admitting: Nurse Practitioner

## 2024-09-28 ENCOUNTER — Inpatient Hospital Stay: Admitting: Oncology

## 2024-09-28 ENCOUNTER — Inpatient Hospital Stay

## 2025-06-05 ENCOUNTER — Ambulatory Visit: Admitting: Nurse Practitioner
